# Patient Record
Sex: Male | Born: 1970
Health system: Southern US, Community
[De-identification: ages and names within clinical notes are randomized; demographics above are authoritative.]

## PROBLEM LIST (undated history)

## (undated) DIAGNOSIS — E785 Hyperlipidemia, unspecified: Secondary | ICD-10-CM

## (undated) DIAGNOSIS — I10 Essential (primary) hypertension: Secondary | ICD-10-CM

## (undated) DIAGNOSIS — M545 Low back pain, unspecified: Secondary | ICD-10-CM

## (undated) DIAGNOSIS — F319 Bipolar disorder, unspecified: Secondary | ICD-10-CM

## (undated) DIAGNOSIS — F419 Anxiety disorder, unspecified: Secondary | ICD-10-CM

## (undated) DIAGNOSIS — J449 Chronic obstructive pulmonary disease, unspecified: Secondary | ICD-10-CM

## (undated) DIAGNOSIS — F329 Major depressive disorder, single episode, unspecified: Secondary | ICD-10-CM

## (undated) DIAGNOSIS — N179 Acute kidney failure, unspecified: Secondary | ICD-10-CM

## (undated) DIAGNOSIS — F32A Depression, unspecified: Secondary | ICD-10-CM

## (undated) HISTORY — DX: Low back pain, unspecified: M54.50

## (undated) HISTORY — DX: Hyperlipidemia, unspecified: E78.5

## (undated) HISTORY — DX: Chronic obstructive pulmonary disease, unspecified: J44.9

## (undated) HISTORY — DX: Bipolar disorder, unspecified: F31.9

## (undated) HISTORY — DX: Major depressive disorder, single episode, unspecified: F32.9

## (undated) HISTORY — DX: Essential (primary) hypertension: I10

## (undated) HISTORY — DX: Anxiety disorder, unspecified: F41.9

## (undated) HISTORY — DX: Low back pain: M54.5

## (undated) HISTORY — DX: Depression, unspecified: F32.A

## (undated) HISTORY — DX: Acute kidney failure, unspecified: N17.9

## (undated) HISTORY — PX: NO PAST SURGERIES: SHX2092

---

## 2010-10-01 ENCOUNTER — Ambulatory Visit (INDEPENDENT_AMBULATORY_CARE_PROVIDER_SITE_OTHER): Payer: 59 | Admitting: Internal Medicine

## 2010-10-01 ENCOUNTER — Encounter: Payer: Self-pay | Admitting: Internal Medicine

## 2010-10-01 DIAGNOSIS — R599 Enlarged lymph nodes, unspecified: Secondary | ICD-10-CM | POA: Insufficient documentation

## 2010-10-01 DIAGNOSIS — F17200 Nicotine dependence, unspecified, uncomplicated: Secondary | ICD-10-CM

## 2010-10-01 DIAGNOSIS — J45901 Unspecified asthma with (acute) exacerbation: Secondary | ICD-10-CM | POA: Insufficient documentation

## 2010-10-01 DIAGNOSIS — M545 Low back pain: Secondary | ICD-10-CM | POA: Insufficient documentation

## 2010-10-01 DIAGNOSIS — J209 Acute bronchitis, unspecified: Secondary | ICD-10-CM

## 2010-10-10 NOTE — Assessment & Plan Note (Signed)
Summary: NEW PT /NWS   Vital Signs:  Patient profile:   40 year old male Height:      77 inches Weight:      246 pounds BMI:     29.28 O2 Sat:      96 % on Room air Temp:     98.0 degrees F oral Pulse rate:   82 / minute Pulse rhythm:   regular Resp:     16 per minute BP sitting:   106 / 68  (left arm) Cuff size:   large  Vitals Entered By: Rock Nephew CMA (October 01, 2010 2:38 PM)  Nutrition Counseling: Patient's BMI is greater than 25 and therefore counseled on weight management options.  O2 Flow:  Room air CC: Patient c/o cough x 3-4wks, URI symptoms Is Patient Diabetic? No Pain Assessment Patient in pain? yes     Location: neck  Does patient need assistance? Functional Status Self care Ambulation Normal   Primary Care Provider:  Etta Grandchild MD  CC:  Patient c/o cough x 3-4wks and URI symptoms.  History of Present Illness:  URI Symptoms      This is a 40 year old man who presents with URI symptoms.  The symptoms began 3 weeks ago.  The severity is described as mild.  The patient reports productive cough and sick contacts, but denies nasal congestion, clear nasal discharge, purulent nasal discharge, sore throat, dry cough, and earache.  Associated symptoms include dyspnea and wheezing.  The patient denies fever, stiff neck, rash, vomiting, diarrhea, use of an antipyretic, and response to antipyretic.  The patient denies itchy throat, sneezing, seasonal symptoms, headache, muscle aches, and severe fatigue.  The patient denies the following risk factors for Strep sinusitis: unilateral facial pain, unilateral nasal discharge, poor response to decongestant, double sickening, tooth pain, Strep exposure, tender adenopathy, and absence of cough.    Also, he wants to try Wellbutrin to quit smoking.  Preventive Screening-Counseling & Management  Alcohol-Tobacco     Alcohol drinks/day: 1     Alcohol type: beer     >5/day in last 3 mos: no     Alcohol Counseling:  not indicated; use of alcohol is not excessive or problematic     Feels need to cut down: no     Feels annoyed by complaints: no     Feels guilty re: drinking: no     Needs 'eye opener' in am: no     Smoking Status: current     Smoking Cessation Counseling: yes     Smoke Cessation Stage: ready     Packs/Day: 1.0     Year Started: 1992     Pack years: 20     Tobacco Counseling: to quit use of tobacco products  Caffeine-Diet-Exercise     Does Patient Exercise: yes  Hep-HIV-STD-Contraception     Hepatitis Risk: no risk noted     HIV Risk: no risk noted     STD Risk: no risk noted      Sexual History:  currently monogamous.        Drug Use:  no.        Blood Transfusions:  no.    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    Physical Exam  General:  alert, well-developed, well-nourished, well-hydrated, appropriate dress, normal appearance, healthy-appearing, cooperative to examination, and good hygiene.   Head:  normocephalic, atraumatic, no abnormalities observed, and no abnormalities palpated.   Eyes:  vision grossly intact, pupils equal, pupils round, and pupils reactive to light.   Ears:  R ear normal and L ear normal.   Nose:   External nasal examination shows no deformity or inflammation. Nasal mucosa are pink and moist without lesions or exudates. Mouth:  Oral mucosa and oropharynx without lesions or exudates.  Teeth in good repair. Neck:  supple, full ROM, no masses, no thyromegaly, no thyroid nodules or tenderness, no JVD, normal carotid upstroke, no carotid bruits, no neck tenderness, and cervical lymphadenopathy.   Lungs:  normal respiratory effort, no intercostal retractions, no accessory muscle use, normal breath sounds, no dullness, no fremitus, no crackles, R wheezes, and L wheezes.   Heart:  normal rate, regular rhythm, no murmur, no gallop, no rub, and no JVD.   Abdomen:  soft, non-tender, normal bowel sounds, no distention, no masses, no guarding, no rigidity, no rebound tenderness, no abdominal hernia, no inguinal hernia, no hepatomegaly, and no splenomegaly.   Msk:  No deformity or scoliosis noted of thoracic or lumbar spine.   Pulses:  R and L carotid,radial,femoral,dorsalis pedis and posterior tibial pulses are full and equal bilaterally Extremities:  No clubbing, cyanosis, edema, or deformity noted with normal full range of motion of all joints.   Neurologic:  No cranial nerve deficits noted. Station and gait are normal. Plantar reflexes are down-going bilaterally. DTRs are symmetrical throughout. Sensory, motor and coordinative functions appear intact. Skin:  turgor normal, color normal, no rashes, no suspicious lesions, no ecchymoses, no petechiae, no purpura, no ulcerations, and no edema.   Cervical Nodes:  L anterior LN tender and L anterior LN enlarged.   Axillary Nodes:  no R axillary adenopathy and no L axillary adenopathy.   Inguinal Nodes:  no R inguinal adenopathy and no L inguinal adenopathy.   Psych:  Cognition and judgment appear intact. Alert and cooperative with normal attention span and concentration. No apparent delusions, illusions, hallucinations   Impression &  Recommendations:  Problem # 1:  ASTHMA UNSPECIFIED WITH EXACERBATION (WJX-914.78) Assessment New  His updated medication list for this problem includes:    Symbicort 80-4.5 Mcg/act Aero (Budesonide-formoterol fumarate) .Marland Kitchen... 2 puffs two times a day  Problem # 2:  TOBACCO USE (ICD-305.1) Assessment: New Try wellbutrin at his request  Problem # 3:  BRONCHITIS-ACUTE (ICD-466.0) Assessment: New  His updated medication list for this problem includes:    Avelox 400 Mg Tabs (Moxifloxacin hcl) ..... One by mouth once daily for 7 days    Symbicort 80-4.5 Mcg/act Aero (Budesonide-formoterol fumarate) .Marland Kitchen... 2 puffs two times a day  Take antibiotics and other medications as directed. Encouraged to push clear liquids, get enough rest, and take acetaminophen as needed. To be seen in 5-7 days if no improvement, sooner if worse.  Problem # 4:  LYMPH NODE-ENLARGED (ICD-785.6) Assessment: New  His updated medication  list for this problem includes:    Avelox 400 Mg Tabs (Moxifloxacin hcl) ..... One by mouth once daily for 7 days  Complete Medication List: 1)  Avelox 400 Mg Tabs (Moxifloxacin hcl) .... One by mouth once daily for 7 days 2)  Symbicort 80-4.5 Mcg/act Aero (Budesonide-formoterol fumarate) .... 2 puffs two times a day 3)  Budeprion Xl 150 Mg Xr24h-tab (Bupropion hcl) .... One by mouth once daily  Patient Instructions: 1)  Please schedule a follow-up appointment in 2 weeks. 2)  Tobacco is very bad for your health and your loved ones! You Should stop smoking!. 3)  Stop Smoking Tips: Choose a Quit date. Cut down before the Quit date. decide what you will do as a substitute when you feel the urge to smoke(gum,toothpick,exercise). 4)  Take your antibiotic as prescribed until ALL of it is gone, but stop if you develop a rash or swelling and contact our office as soon as possible. 5)  Acute bronchitis symptoms for less than 10 days are not helped by antibiotics. take over the counter cough  medications. call if no improvment in  5-7 days, sooner if increasing cough, fever, or new symptoms( shortness of breath, chest pain). Prescriptions: BUDEPRION XL 150 MG XR24H-TAB (BUPROPION HCL) One by mouth once daily  #30 x 5   Entered and Authorized by:   Etta Grandchild MD   Signed by:   Etta Grandchild MD on 10/01/2010   Method used:   Print then Give to Patient   RxID:   [id number] SYMBICORT 80-4.5 MCG/ACT AERO (BUDESONIDE-FORMOTEROL FUMARATE) 2 puffs two times a day  #4 inhs x 0   Entered and Authorized by:   Etta Grandchild MD   Signed by:   Etta Grandchild MD on 10/01/2010   Method used:   Samples Given   RxID:   [id number] AVELOX 400 MG TABS (MOXIFLOXACIN HCL) one by mouth once daily for 7 days  #7 x 1   Entered and Authorized by:   Etta Grandchild MD   Signed by:   Etta Grandchild MD on 10/01/2010   Method used:   Print then Give to Patient   RxID:   [id number]    Orders Added: 1)  New Patient Level IV [53664]   Immunization History:  Pneumovax Immunization History:    Pneumovax:  historical (08/19/2009)   Immunization History:  Pneumovax Immunization History:    Pneumovax:  Historical (08/19/2009)  Preventive Care Screening  Last Tetanus Booster:    Date:  08/19/2009    Results:  Historical

## 2010-10-22 ENCOUNTER — Ambulatory Visit (INDEPENDENT_AMBULATORY_CARE_PROVIDER_SITE_OTHER): Payer: 59 | Admitting: Internal Medicine

## 2010-10-22 ENCOUNTER — Encounter: Payer: Self-pay | Admitting: Internal Medicine

## 2010-10-22 DIAGNOSIS — R599 Enlarged lymph nodes, unspecified: Secondary | ICD-10-CM

## 2010-10-22 DIAGNOSIS — J45901 Unspecified asthma with (acute) exacerbation: Secondary | ICD-10-CM

## 2010-10-30 NOTE — Assessment & Plan Note (Signed)
Summary: fu/lb   Vital Signs:  Patient profile:   40 year old male Height:      77 inches Weight:      248 pounds BMI:     29.51 O2 Sat:      97 % on Room air Temp:     98.5 degrees F oral Pulse rate:   72 / minute Pulse rhythm:   regular Resp:     16 per minute BP sitting:   122 / 72  (left arm) Cuff size:   large  Vitals Entered By: Rock Nephew CMA (October 22, 2010 10:17 AM)  Nutrition Counseling: Patient's BMI is greater than 25 and therefore counseled on weight management options.  O2 Flow:  Room air  Primary Care Provider:  Etta Grandchild MD   History of Present Illness:  Follow-Up Visit      This is a 40 year old man who presents for Follow-up visit.  The patient denies chest pain, palpitations, dizziness, syncope, edema, SOB, DOE, PND, and orthopnea.  Since the last visit the patient notes no new problems or concerns.  The patient reports taking meds as prescribed.  When questioned about possible medication side effects, the patient notes none.    Preventive Screening-Counseling & Management  Alcohol-Tobacco     Alcohol drinks/day: 1     Alcohol type: beer     >5/day in last 3 mos: no     Alcohol Counseling: not indicated; use of alcohol is not excessive or problematic     Feels need to cut down: no     Feels annoyed by complaints: no     Feels guilty re: drinking: no     Needs 'eye opener' in am: no     Smoking Status: current     Smoking Cessation Counseling: yes     Smoke Cessation Stage: contemplative     Packs/Day: 1.0     Year Started: 1992     Pack years: 20     Tobacco Counseling: to quit use of tobacco products  Hep-HIV-STD-Contraception     Hepatitis Risk: no risk noted     HIV Risk: no risk noted     STD Risk: no risk noted      Sexual History:  currently monogamous.        Drug Use:  no.        Blood Transfusions:  no.    Clinical Review Panels:  Immunizations   Last Tetanus Booster:  Historical (08/19/2009)   Last Pneumovax:   Historical (08/19/2009)  Diabetes Management   Last Pneumovax:  Historical (08/19/2009)   Medications Prior to Update: 1)  Avelox 400 Mg Tabs (Moxifloxacin Hcl) .... One By Mouth Once Daily For 7 Days 2)  Symbicort 80-4.5 Mcg/act Aero (Budesonide-Formoterol Fumarate) .... 2 Puffs Two Times A Day 3)  Budeprion Xl 150 Mg Xr24h-Tab (Bupropion Hcl) .... One By Mouth Once Daily  Current Medications (verified): 1)  Symbicort 80-4.5 Mcg/act Aero (Budesonide-Formoterol Fumarate) .... 2 Puffs Two Times A Day 2)  Budeprion Xl 150 Mg Xr24h-Tab (Bupropion Hcl) .... 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 Family History Hypertension  Social History: Reviewed history from 10/01/2010 and no changes required. 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   Resp:  Denies chest discomfort, chest pain with inspiration, cough, coughing  up blood, excessive snoring, hypersomnolence, morning headaches, pleuritic, shortness of breath, sputum productive, and wheezing.  Physical Exam  General:  alert, well-developed, well-nourished, well-hydrated, appropriate dress, normal appearance, and healthy-appearing.   Head:  normocephalic and atraumatic.   Eyes:  No corneal or conjunctival inflammation noted. EOMI. Perrla. Funduscopic exam benign, without hemorrhages, exudates or papilledema. Vision grossly normal. Ears:  External ear exam shows no significant lesions or deformities.  Otoscopic examination reveals clear canals, tympanic membranes are intact bilaterally without bulging, retraction, inflammation or discharge. Hearing is grossly normal bilaterally. Nose:  External nasal examination shows no deformity or inflammation. Nasal mucosa are pink and moist without lesions or exudates. Mouth:  Oral mucosa and oropharynx without lesions or exudates.  Teeth in good repair. Neck:  No deformities, masses, or tenderness noted. Lungs:  Normal respiratory effort, chest expands symmetrically. Lungs are clear to auscultation, no crackles or wheezes. Heart:  Normal rate and regular rhythm. S1 and S2 normal without gallop, murmur, click, rub or other extra sounds. Abdomen:  soft, non-tender, normal bowel sounds, no distention, no masses, no guarding, no rigidity, no rebound tenderness, no abdominal hernia, no inguinal hernia, no hepatomegaly, and no splenomegaly.   Msk:  No deformity or scoliosis noted of thoracic or lumbar spine.   Pulses:  R and L carotid,radial,femoral,dorsalis pedis and posterior tibial pulses are full and equal bilaterally Extremities:  No clubbing, cyanosis, edema, or deformity noted with normal full range of motion of all joints.   Neurologic:  No cranial nerve deficits noted. Station and gait are normal. Plantar reflexes are down-going bilaterally. DTRs are symmetrical throughout. Sensory, motor and coordinative functions  appear intact. Skin:  turgor normal, color normal, no rashes, no suspicious lesions, no ecchymoses, no petechiae, no purpura, no ulcerations, and no edema.   Cervical Nodes:  no anterior cervical adenopathy and no posterior cervical adenopathy.   Axillary Nodes:  no R axillary adenopathy and no L axillary adenopathy.   Inguinal Nodes:  no R inguinal adenopathy and no L inguinal adenopathy.   Psych:  Cognition and judgment appear intact. Alert and cooperative with normal attention span and concentration. No apparent delusions, illusions, hallucinations   Impression & Recommendations:  Problem # 1:  ASTHMA UNSPECIFIED WITH EXACERBATION (ICD-493.92) Assessment Improved  His updated medication list for this problem includes:    Symbicort 80-4.5 Mcg/act Aero (Budesonide-formoterol fumarate) .Marland Kitchen... 2 puffs two times a day  Pulmonary Functions Reviewed: O2 sat: 97 (10/22/2010)  Problem # 2:  LYMPH NODE-ENLARGED (ICD-785.6) Assessment: Improved  The following medications were removed from the medication list:    Avelox 400 Mg Tabs (Moxifloxacin hcl) ..... One by mouth once daily for 7 days  Complete Medication List: 1)  Symbicort 80-4.5 Mcg/act Aero (Budesonide-formoterol fumarate) .... 2 puffs two times a day 2)  Budeprion Xl  150 Mg Xr24h-tab (Bupropion hcl) .... One by mouth once daily  Patient Instructions: 1)  Please schedule a follow-up appointment in 2 months. 2)  Tobacco is very bad for your health and your loved ones! You Should stop smoking!. 3)  Stop Smoking Tips: Choose a Quit date. Cut down before the Quit date. decide what you will do as a substitute when you feel the urge to smoke(gum,toothpick,exercise). 4)  It is important that you exercise regularly at least 20 minutes 5 times a week. If you develop chest pain, have severe difficulty breathing, or feel very tired , stop exercising immediately and seek medical attention.   Orders Added: 1)  Est. Patient Level III [81191]

## 2011-01-21 ENCOUNTER — Ambulatory Visit (INDEPENDENT_AMBULATORY_CARE_PROVIDER_SITE_OTHER): Payer: 59 | Admitting: Internal Medicine

## 2011-01-21 ENCOUNTER — Encounter: Payer: 59 | Admitting: Internal Medicine

## 2011-01-21 ENCOUNTER — Encounter: Payer: Self-pay | Admitting: Internal Medicine

## 2011-01-21 ENCOUNTER — Other Ambulatory Visit (INDEPENDENT_AMBULATORY_CARE_PROVIDER_SITE_OTHER): Payer: 59

## 2011-01-21 VITALS — BP 122/70 | HR 74 | Temp 97.9°F | Resp 16 | Wt 237.0 lb

## 2011-01-21 DIAGNOSIS — Z0000 Encounter for general adult medical examination without abnormal findings: Secondary | ICD-10-CM

## 2011-01-21 DIAGNOSIS — J45901 Unspecified asthma with (acute) exacerbation: Secondary | ICD-10-CM

## 2011-01-21 DIAGNOSIS — F17200 Nicotine dependence, unspecified, uncomplicated: Secondary | ICD-10-CM

## 2011-01-21 DIAGNOSIS — Z23 Encounter for immunization: Secondary | ICD-10-CM

## 2011-01-21 LAB — CBC WITH DIFFERENTIAL/PLATELET
Basophils Relative: 0.3 % (ref 0.0–3.0)
Eosinophils Absolute: 0.2 10*3/uL (ref 0.0–0.7)
Lymphocytes Relative: 32.5 % (ref 12.0–46.0)
MCHC: 35.2 g/dL (ref 30.0–36.0)
MCV: 92.3 fl (ref 78.0–100.0)
Monocytes Absolute: 0.5 10*3/uL (ref 0.1–1.0)
Neutrophils Relative %: 57.6 % (ref 43.0–77.0)
Platelets: 177 10*3/uL (ref 150.0–400.0)
RBC: 4.84 Mil/uL (ref 4.22–5.81)
WBC: 7.3 10*3/uL (ref 4.5–10.5)

## 2011-01-21 LAB — LIPID PANEL
Cholesterol: 143 mg/dL (ref 0–200)
HDL: 31.2 mg/dL — ABNORMAL LOW (ref >39.00)
LDL Cholesterol: 77 mg/dL (ref 0–99)
VLDL: 34.8 mg/dL (ref 0.0–40.0)

## 2011-01-21 LAB — COMPREHENSIVE METABOLIC PANEL
ALT: 42 U/L (ref 0–53)
AST: 25 U/L (ref 0–37)
Albumin: 4.5 g/dL (ref 3.5–5.2)
Alkaline Phosphatase: 77 U/L (ref 39–117)
BUN: 13 mg/dL (ref 6–23)
Calcium: 9.2 mg/dL (ref 8.4–10.5)
Chloride: 101 mEq/L (ref 96–112)
Potassium: 4.1 mEq/L (ref 3.5–5.1)
Sodium: 138 mEq/L (ref 135–145)
Total Protein: 7.1 g/dL (ref 6.0–8.3)

## 2011-01-21 MED ORDER — PNEUMOCOCCAL VAC POLYVALENT 25 MCG/0.5ML IJ INJ: 0.5000 mL | INJECTION | Freq: Once | INTRAMUSCULAR | Status: DC

## 2011-01-21 NOTE — Progress Notes (Signed)
  Subjective:    Patient ID: Matthew [name], male    DOB: [birth year], 40 y.o.   MRN: [id number]  HPI He returns for a complete physical and he tells me that he is doing well.  Review of Systems  Constitutional: Negative for fever, chills, diaphoresis, activity change, appetite change, fatigue and unexpected weight change.  HENT: Negative for facial swelling, neck pain and neck stiffness.   Eyes: Negative for photophobia and visual disturbance.  Respiratory: Negative for apnea, cough, choking, chest tightness, shortness of breath, wheezing and stridor.   Cardiovascular: Negative for chest pain, palpitations and leg swelling.  Gastrointestinal: Negative for nausea, vomiting, abdominal pain, diarrhea, constipation and blood in stool.  Genitourinary: Negative for dysuria, urgency, frequency, hematuria, flank pain, decreased urine volume, discharge, penile swelling, scrotal swelling, enuresis, difficulty urinating, genital sores, penile pain and testicular pain.  Musculoskeletal: Negative for myalgias, back pain, joint swelling, arthralgias and gait problem.  Skin: Negative for color change, pallor and rash.  Neurological: Negative for dizziness, tremors, seizures, syncope, facial asymmetry, speech difficulty, weakness, light-headedness, numbness and headaches.  Hematological: Negative for adenopathy. Does not bruise/bleed easily.  Psychiatric/Behavioral: Negative.  Negative for suicidal ideas, hallucinations, behavioral problems, confusion, sleep disturbance, self-injury, dysphoric mood, decreased concentration and agitation. The patient is not nervous/anxious and is not hyperactive.        Objective:   Physical Exam  Vitals reviewed. Constitutional: He is oriented to person, place, and time. He appears well-developed and well-nourished. No distress.  HENT:  Head: Normocephalic and atraumatic.  Right Ear: External ear normal.  Left Ear: External ear normal.  Nose: Nose normal.    Mouth/Throat: Oropharynx is clear and moist. No oropharyngeal exudate.  Eyes: Conjunctivae and EOM are normal. Pupils are equal, round, and reactive to light. Right eye exhibits no discharge. Left eye exhibits no discharge. No scleral icterus.  Neck: Normal range of motion. Neck supple. No JVD present. No tracheal deviation present. No thyromegaly present.  Cardiovascular: Normal rate, regular rhythm, normal heart sounds and intact distal pulses.  Exam reveals no gallop and no friction rub.   No murmur heard. Pulmonary/Chest: Effort normal and breath sounds normal. No stridor. No respiratory distress. He has no wheezes. He has no rales. He exhibits no tenderness.  Abdominal: Soft. Bowel sounds are normal. He exhibits no distension and no mass. There is no tenderness. There is no rebound and no guarding. Hernia confirmed negative in the right inguinal area and confirmed negative in the left inguinal area.  Genitourinary: Testes normal and penis normal. Right testis shows no mass, no swelling and no tenderness. Left testis shows no mass, no swelling and no tenderness. Circumcised. No penile erythema or penile tenderness. No discharge found.  Musculoskeletal: Normal range of motion. He exhibits no edema and no tenderness.  Lymphadenopathy:    He has no cervical adenopathy.       Right: No inguinal adenopathy present.       Left: No inguinal adenopathy present.  Neurological: He is alert and oriented to person, place, and time. He has normal reflexes. He displays normal reflexes. No cranial nerve deficit. He exhibits normal muscle tone. Coordination normal.  Skin: Skin is warm and dry. No rash noted. He is not diaphoretic. No erythema. No pallor.  Psychiatric: He has a normal mood and affect. His behavior is normal. Judgment and thought content normal.          Assessment & Plan:

## 2011-01-21 NOTE — Assessment & Plan Note (Signed)
He did not take the wellbutrin consistently and did not stop smoking yet, I encouraged him to quit

## 2011-01-21 NOTE — Patient Instructions (Signed)
Health Maintenance in Males MAINTAIN REGULAR HEALTH EXAMS  Maintain a healthy diet and normal weight. Increased weight leads to problems with blood pressure and diabetes. Decrease fat in the diet and increase exercise. Obtain a proper diet from your caregiver if necessary.   High blood pressure causes heart and blood vessel problems. Check blood pressures regularly and keep your blood pressure at normal limits. Aerobic exercise helps this. Persistent elevations of blood pressure should be treated with medications if weight loss and exercise are ineffective.   Avoid smoking, drinking in excess (more than 2 drinks per day), or use of street drugs. Do not share needles with anyone. Ask for help if you need assistance or instructions on stopping the use of alcohol, cigarettes, or drugs.   Maintain normal blood lipids and cholesterol. Your caregiver can give you information to lower your risk of heart disease or stroke.   Ask your caregiver if you are in need of early heart disease screening because of a strong family history of heart disease or signs of elevated testosterone (male sex hormone) levels. These can predispose you to early heart disease.   Practice safe sex. Practicing safe sex decreases your risk for a sexually transmitted infection (STI). Some of the STIs are gonorrhea, chlamydia, syphilis, trichimonas, herpes, human papillomavirus (HPV), and human immunodeficiency virus (HIV). Herpes, HIV, and HPV are viral illnesses that have no cure. These can result in disability, cancer, and death.   It is not safe for someone who has AIDS or is HIV positive to have unprotected sex with a partner who is HIV positive. The reason for this is the fact that there are many different strains of HIV. If you have a strain that is readily treated with medications and then suddenly introduce a strain from a partner that has no further treatment options, you may suddenly have a strain of HIV that is untreatable.  Even if you are both positive for HIV, it is still necessary to practice safe sex.   Use sunscreen with a SPF of 15 or greater. Being outside in the sun when your shadow caused by the sun is shorter than you are, means you are being exposed to sun at greater intensity. Lighter skinned people are at a greater risk of skin cancer.   Keep carbon monoxide and smoke detectors in your home and functioning at all times. Change the batteries every 6 months.   Do monthly examinations of your testicles. The best time to do this is after a hot shower or bath when the tissues are loose. Notify your caregivers of any lumps, tenderness, or changes in size or shape.   Notify your caregiver of new moles or changes in moles, especially if there is a change in shape or color. Also notify your caregiver if a mole is larger than the size of a pencil eraser.   Stay current with your tetanus shots and other required immunizations.  The Body Mass Index (BMI) is a way of measuring how much of your body is fat. Having a BMI above 27 increases the risk of heart disease, diabetes, hypertension, stroke, and other problems related to obesity. Document Released: 02/01/2008 Document Re-Released: 01/23/2010 [facility] Patient Information 2011 [place], [place].Smoking Cessation This document explains the best ways for you to quit smoking and new treatments to help. It lists new medicines that can double or triple your chances of quitting and quitting for good. It also considers ways to avoid relapses and concerns you may have about quitting,  including weight gain. NICOTINE: A POWERFUL ADDICTION If you have tried to quit smoking, you know how hard it can be. It is hard because nicotine is a very addictive drug. For some people, it can be as addictive as heroin or cocaine. Usually, people make 2 or 3 tries, or more, before finally being able to quit. Each time you try to quit, you can learn about what helps and what hurts. Quitting takes  hard work and a lot of effort, but you can quit smoking. QUITTING SMOKING IS ONE OF THE MOST IMPORTANT THINGS YOU WILL EVER DO:  You will live longer, feel better, and live better.   The impact on your body of quitting smoking is felt almost immediately:   Within 20 minutes, blood pressure decreases. Pulse returns to its normal level.   After 8 hours, carbon monoxide levels in the blood return to normal. Oxygen level increases.   After 24 hours, chance of heart attack starts to decrease. Breath, hair, and body stop smelling like smoke.   After 48 hours, damaged nerve endings begin to recover. Sense of taste and smell improve.   After 72 hours, the body is virtually free of nicotine. Bronchial tubes relax and breathing becomes easier.   After 2 to 12 weeks, lungs can hold more air. Exercise becomes easier and circulation improves.   Quitting will lower your chance of having a heart attack, stroke, cancer, or lung disease:   After 1 year, the risk of coronary heart disease is cut in half.   After 5 years, the risk of stroke falls to the same as a nonsmoker.   After 10 years, the risk of lung cancer is cut in half and the risk of other cancers decreases significantly.   After 15 years, the risk of coronary heart disease drops, usually to the level of a nonsmoker.   If you are pregnant, quitting smoking will improve your chances of having a healthy baby.   The people you live with, especially your children, will be healthier.   You will have extra money to spend on things other than cigarettes.  FIVE KEYS TO QUITTING Studies have shown that these 5 steps will help you quit smoking and quit for good. You have the best chances of quitting if you use them together: 1. Get ready.  2. Get support and encouragement.  3. Learn new skills and behaviors.  4. Get medicine to reduce your nicotine addiction and use it correctly.  5. Be prepared for relapse or difficult situations. Be  determined to continue trying to quit, even if you do not succeed at first.  1. GET READY  Set a quit date.   Change your environment.   Get rid of ALL cigarettes, ashtrays, matches, and lighters in your home, car, and place of work.   Do not let people smoke in your home.   Review your past attempts to quit. Think about what worked and what did not.   Once you quit, do not smoke. NOT EVEN A PUFF!  2. GET SUPPORT AND ENCOURAGEMENT Studies have shown that you have a better chance of being successful if you have help. You can get support in many ways.  Tell your family, friends, and coworkers that you are going to quit and need their support. Ask them not to smoke around you.   Talk to your caregivers (doctor, dentist, nurse, pharmacist, psychologist, and/or smoking counselor).   Get individual, group, or telephone counseling and support. The more  counseling you have, the better your chances are of quitting. Programs are available at [employer] and health centers. Call your local health department for information about programs in your area.   Spiritual beliefs and practices may help some smokers quit.   Quit meters are Photographer that keep track of quit statistics, such as amount of "quit-time," cigarettes not smoked, and money saved.   Many smokers find one or more of the many self-help books available useful in helping them quit and stay off tobacco.  3. LEARN NEW SKILLS AND BEHAVIORS  Try to distract yourself from urges to smoke. Talk to someone, go for a walk, or occupy your time with a task.   When you first try to quit, change your routine. Take a different route to work. Drink tea instead of coffee. Eat breakfast in a different place.   Do something to reduce your stress. Take a hot bath, exercise, or read a book.   Plan something enjoyable to do every day. Reward yourself for not smoking.   Explore interactive web-based programs  that specialize in helping you quit.  4. GET MEDICINE AND USE IT CORRECTLY Medicines can help you stop smoking and decrease the urge to smoke. Combining medicine with the above behavioral methods and support can quadruple your chances of successfully quitting smoking. The [place] Food and Drug Administration (FDA) has approved 7 medicines to help you quit smoking. These medicines fall into 3 categories.  Nicotine replacement therapy (delivers nicotine to your body without the negative effects and risks of smoking):   Nicotine gum: Available over-the-counter.   Nicotine lozenges: Available over-the-counter.   Nicotine inhaler: Available by prescription.   Nicotine nasal spray: Available by prescription.   Nicotine skin patches (transdermal): Available by prescription and over-the-counter.   Antidepressant medicine (helps people abstain from smoking, but how this works is unknown):   Bupropion sustained-release (SR) tablets: Available by prescription.   Nicotinic receptor partial agonist (simulates the effect of nicotine in your brain):   Varenicline tartrate tablets: Available by prescription.   Ask your caregiver for advice about which medicines to use and how to use them. Carefully read the information on the package.   Everyone who is trying to quit may benefit from using a medicine. If you are pregnant or trying to become pregnant, nursing an infant, you are under age 27, or you smoke fewer than 10 cigarettes per day, talk to your caregiver before taking any nicotine replacement medicines.   You should stop using a nicotine replacement product and call your caregiver if you experience nausea, dizziness, weakness, vomiting, fast or irregular heartbeat, mouth problems with the lozenge or gum, or redness or swelling of the skin around the patch that does not go away.   Do not use any other product containing nicotine while using a nicotine replacement product.   Talk to your caregiver  before using these products if you have diabetes, heart disease, asthma, stomach ulcers, you had a recent heart attack, you have high blood pressure that is not controlled with medicine, a history of irregular heartbeat, or you have been prescribed medicine to help you quit smoking.  5. BE PREPARED FOR RELAPSE OR DIFFICULT SITUATIONS  Most relapses occur within the first 3 months after quitting. Do not be discouraged if you start smoking again. Remember, most people try several times before they finally quit.   You may have symptoms of withdrawal because your body is used to nicotine. You  may crave cigarettes, be irritable, feel very hungry, cough often, get headaches, or have difficulty concentrating.   The withdrawal symptoms are only temporary. They are strongest when you first quit, but they will go away within 10 to 14 days.  Here are some difficult situations to watch for:  Alcohol. Avoid drinking alcohol. Drinking lowers your chances of successfully quitting.   Caffeine. Try to reduce the amount of caffeine you consume. It also lowers your chances of successfully quitting.   Other smokers. Being around smoking can make you want to smoke. Avoid smokers.   Weight gain. Many smokers will gain weight when they quit, usually less than 10 pounds. Eat a healthy diet and stay active. Do not let weight gain distract you from your main goal, quitting smoking. Some medicines that help you quit smoking may also help delay weight gain. You can always lose the weight gained after you quit.   Bad mood or depression. There are a lot of ways to improve your mood other than smoking.  If you are having problems with any of these situations, talk to your caregiver. SPECIAL SITUATIONS OR CONDITIONS Studies suggest that everyone can quit smoking. Your situation or condition can give you a special reason to quit.  Pregnant women/New mothers: By quitting, you protect your baby's health and your own.    Hospitalized patients: By quitting, you reduce health problems and help healing.   Heart attack patients: By quitting, you reduce your risk of a second heart attack.   Lung, head, and neck cancer patients: By quitting, you reduce your chance of a second cancer.   Parents of children and adolescents: By quitting, you protect your children from illnesses caused by secondhand smoke.  QUESTIONS TO THINK ABOUT Think about the following questions before you try to stop smoking. You may want to talk about your answers with your caregiver.  Why do you want to quit?   If you tried to quit in the past, what helped and what did not?   What will be the most difficult situations for you after you quit? How will you plan to handle them?   Who can help you through the tough times? Your family? Friends? Caregiver?   What pleasures do you get from smoking? What ways can you still get pleasure if you quit?  Here are some questions to ask your caregiver:  How can you help me to be successful at quitting?   What medicine do you think would be best for me and how should I take it?   What should I do if I need more help?   What is smoking withdrawal like? How can I get information on withdrawal?  Quitting takes hard work and a lot of effort, but you can quit smoking. FOR MORE INFORMATION Smokefree.gov (http://www.davis-sullivan.com/) provides free, accurate, evidence-based information and professional assistance to help support the immediate and long-term needs of people trying to quit smoking. Document Released: 07/30/2001 Document Re-Released: 01/23/2010 [facility] Patient Information 2011 [place], [place].

## 2011-01-21 NOTE — Assessment & Plan Note (Signed)
Vaccines updated and given, labs ordered, pt ed material given

## 2012-01-18 DIAGNOSIS — N179 Acute kidney failure, unspecified: Secondary | ICD-10-CM

## 2012-01-18 HISTORY — DX: Acute kidney failure, unspecified: N17.9

## 2012-01-25 ENCOUNTER — Emergency Department (HOSPITAL_COMMUNITY)
Admission: EM | Admit: 2012-01-25 | Discharge: 2012-01-26 | Disposition: A | Discharge status: Home / Self Care | Payer: 59 | Attending: Emergency Medicine | Admitting: Emergency Medicine

## 2012-01-25 DIAGNOSIS — R1013 Epigastric pain: Secondary | ICD-10-CM | POA: Insufficient documentation

## 2012-01-25 DIAGNOSIS — N289 Disorder of kidney and ureter, unspecified: Secondary | ICD-10-CM | POA: Insufficient documentation

## 2012-01-25 DIAGNOSIS — R110 Nausea: Secondary | ICD-10-CM | POA: Insufficient documentation

## 2012-01-25 DIAGNOSIS — R109 Unspecified abdominal pain: Secondary | ICD-10-CM

## 2012-01-25 LAB — CBC
HCT: 40.2 % (ref 39.0–52.0)
Hemoglobin: 14.3 g/dL (ref 13.0–17.0)
MCH: 31.2 pg (ref 26.0–34.0)
RBC: 4.58 MIL/uL (ref 4.22–5.81)

## 2012-01-25 MED ORDER — ONDANSETRON HCL 4 MG/2ML IJ SOLN
4.0000 mg | Freq: Once | INTRAMUSCULAR | Status: AC
  Administered 2012-01-26: 4 mg via INTRAVENOUS
  Filled 2012-01-25: qty 2

## 2012-01-25 MED ORDER — SODIUM CHLORIDE 0.9 % IV SOLN
Freq: Once | INTRAVENOUS | Status: AC
  Administered 2012-01-26: 01:00:00 via INTRAVENOUS

## 2012-01-25 MED ORDER — HYDROMORPHONE HCL PF 1 MG/ML IJ SOLN
1.0000 mg | Freq: Once | INTRAMUSCULAR | Status: AC
  Administered 2012-01-26: 1 mg via INTRAVENOUS
  Filled 2012-01-25: qty 1

## 2012-01-25 MED ORDER — SODIUM CHLORIDE 0.9 % IV BOLUS (SEPSIS)
1000.0000 mL | Freq: Once | INTRAVENOUS | Status: AC
  Administered 2012-01-26: 1000 mL via INTRAVENOUS

## 2012-01-25 NOTE — ED Notes (Signed)
MD at bedside. 

## 2012-01-25 NOTE — ED Notes (Signed)
Per pt: Pt with recent UTI and prostatitis, on Cipro.  Pt left work Wednesday due to chronic back pain and stomach pain, and has been feeling nauseated since, felt a little better Friday. Feels worse today, but not as bad as Wednesday.

## 2012-01-25 NOTE — ED Notes (Signed)
Pt alert, nad, c/o gen abd pain, onset several days ago, associated with diarrhea, low back pain, and nausea, pt states recently treated for UTI/prostatitis, states could not finish antibiotics d/t nausea.

## 2012-01-26 ENCOUNTER — Inpatient Hospital Stay (HOSPITAL_COMMUNITY)
Admission: EM | Admit: 2012-01-26 | Discharge: 2012-01-30 | DRG: 392 | Disposition: A | Discharge status: Home / Self Care | Payer: 59 | Attending: Family Medicine | Admitting: Family Medicine

## 2012-01-26 ENCOUNTER — Encounter (HOSPITAL_COMMUNITY): Payer: Self-pay | Admitting: *Deleted

## 2012-01-26 ENCOUNTER — Emergency Department (HOSPITAL_COMMUNITY): Payer: 59

## 2012-01-26 DIAGNOSIS — R51 Headache: Secondary | ICD-10-CM | POA: Diagnosis present

## 2012-01-26 DIAGNOSIS — F17200 Nicotine dependence, unspecified, uncomplicated: Secondary | ICD-10-CM | POA: Diagnosis present

## 2012-01-26 DIAGNOSIS — Z0000 Encounter for general adult medical examination without abnormal findings: Secondary | ICD-10-CM

## 2012-01-26 DIAGNOSIS — Z791 Long term (current) use of non-steroidal anti-inflammatories (NSAID): Secondary | ICD-10-CM

## 2012-01-26 DIAGNOSIS — A088 Other specified intestinal infections: Secondary | ICD-10-CM

## 2012-01-26 DIAGNOSIS — J45909 Unspecified asthma, uncomplicated: Secondary | ICD-10-CM | POA: Diagnosis present

## 2012-01-26 DIAGNOSIS — M545 Low back pain, unspecified: Secondary | ICD-10-CM | POA: Diagnosis present

## 2012-01-26 DIAGNOSIS — Z79899 Other long term (current) drug therapy: Secondary | ICD-10-CM

## 2012-01-26 DIAGNOSIS — R109 Unspecified abdominal pain: Secondary | ICD-10-CM | POA: Diagnosis present

## 2012-01-26 DIAGNOSIS — J45901 Unspecified asthma with (acute) exacerbation: Secondary | ICD-10-CM

## 2012-01-26 DIAGNOSIS — K5289 Other specified noninfective gastroenteritis and colitis: Principal | ICD-10-CM | POA: Diagnosis present

## 2012-01-26 DIAGNOSIS — G8929 Other chronic pain: Secondary | ICD-10-CM | POA: Diagnosis present

## 2012-01-26 DIAGNOSIS — Z720 Tobacco use: Secondary | ICD-10-CM | POA: Diagnosis present

## 2012-01-26 DIAGNOSIS — N179 Acute kidney failure, unspecified: Secondary | ICD-10-CM | POA: Diagnosis present

## 2012-01-26 DIAGNOSIS — E860 Dehydration: Secondary | ICD-10-CM | POA: Diagnosis present

## 2012-01-26 DIAGNOSIS — N289 Disorder of kidney and ureter, unspecified: Secondary | ICD-10-CM

## 2012-01-26 DIAGNOSIS — N10 Acute tubulo-interstitial nephritis: Secondary | ICD-10-CM | POA: Diagnosis present

## 2012-01-26 DIAGNOSIS — K529 Noninfective gastroenteritis and colitis, unspecified: Secondary | ICD-10-CM

## 2012-01-26 LAB — COMPREHENSIVE METABOLIC PANEL
AST: 14 U/L (ref 0–37)
BUN: 19 mg/dL (ref 6–23)
CO2: 25 mEq/L (ref 19–32)
Chloride: 104 mEq/L (ref 96–112)
Creatinine, Ser: 2.57 mg/dL — ABNORMAL HIGH (ref 0.50–1.35)
GFR calc non Af Amer: 29 mL/min — ABNORMAL LOW (ref >90)
Total Bilirubin: 0.6 mg/dL (ref 0.3–1.2)

## 2012-01-26 LAB — URINALYSIS, ROUTINE W REFLEX MICROSCOPIC
Bilirubin Urine: NEGATIVE
Glucose, UA: NEGATIVE mg/dL
Nitrite: NEGATIVE
Specific Gravity, Urine: 1.012 (ref 1.005–1.030)
pH: 5.5 (ref 5.0–8.0)

## 2012-01-26 LAB — BASIC METABOLIC PANEL
CO2: 26 mEq/L (ref 19–32)
Calcium: 9.2 mg/dL (ref 8.4–10.5)
Glucose, Bld: 105 mg/dL — ABNORMAL HIGH (ref 70–99)
Potassium: 4.3 mEq/L (ref 3.5–5.1)
Sodium: 135 mEq/L (ref 135–145)

## 2012-01-26 LAB — HEPATIC FUNCTION PANEL
ALT: 26 U/L (ref 0–53)
Albumin: 4 g/dL (ref 3.5–5.2)
Alkaline Phosphatase: 92 U/L (ref 39–117)
Total Protein: 7.4 g/dL (ref 6.0–8.3)

## 2012-01-26 LAB — CBC
HCT: 40.2 % (ref 39.0–52.0)
Hemoglobin: 14.6 g/dL (ref 13.0–17.0)
RBC: 4.6 MIL/uL (ref 4.22–5.81)
WBC: 9.4 10*3/uL (ref 4.0–10.5)

## 2012-01-26 LAB — DIFFERENTIAL
Basophils Absolute: 0 10*3/uL (ref 0.0–0.1)
Lymphocytes Relative: 11 % — ABNORMAL LOW (ref 12–46)
Monocytes Absolute: 0.5 10*3/uL (ref 0.1–1.0)
Monocytes Relative: 5 % (ref 3–12)
Neutro Abs: 7.9 10*3/uL — ABNORMAL HIGH (ref 1.7–7.7)

## 2012-01-26 LAB — URINE MICROSCOPIC-ADD ON

## 2012-01-26 LAB — MAGNESIUM: Magnesium: 2.2 mg/dL (ref 1.5–2.5)

## 2012-01-26 MED ORDER — HEPARIN SODIUM (PORCINE) 5000 UNIT/ML IJ SOLN
5000.0000 [IU] | Freq: Three times a day (TID) | INTRAMUSCULAR | Status: DC
  Administered 2012-01-27 – 2012-01-30 (×11): 5000 [IU] via SUBCUTANEOUS
  Filled 2012-01-26 (×15): qty 1

## 2012-01-26 MED ORDER — SODIUM CHLORIDE 0.9 % IV SOLN: INTRAVENOUS | Status: DC

## 2012-01-26 MED ORDER — SODIUM CHLORIDE 0.9 % IV BOLUS (SEPSIS)
1000.0000 mL | Freq: Once | INTRAVENOUS | Status: AC
  Administered 2012-01-26: 1000 mL via INTRAVENOUS

## 2012-01-26 MED ORDER — HYDROMORPHONE HCL PF 1 MG/ML IJ SOLN
0.5000 mg | INTRAMUSCULAR | Status: DC | PRN
  Administered 2012-01-26 – 2012-01-27 (×3): 0.5 mg via INTRAVENOUS
  Filled 2012-01-26 (×3): qty 1

## 2012-01-26 MED ORDER — SODIUM CHLORIDE 0.9 % IV SOLN
INTRAVENOUS | Status: DC
  Administered 2012-01-26 – 2012-01-29 (×10): via INTRAVENOUS

## 2012-01-26 MED ORDER — FENTANYL CITRATE 0.05 MG/ML IJ SOLN
100.0000 ug | Freq: Once | INTRAMUSCULAR | Status: AC
  Administered 2012-01-26: 100 ug via INTRAVENOUS
  Filled 2012-01-26: qty 2

## 2012-01-26 MED ORDER — SODIUM CHLORIDE 0.9 % IJ SOLN
3.0000 mL | Freq: Two times a day (BID) | INTRAMUSCULAR | Status: DC
  Administered 2012-01-28 – 2012-01-29 (×2): 3 mL via INTRAVENOUS

## 2012-01-26 MED ORDER — HYDROMORPHONE HCL PF 1 MG/ML IJ SOLN
1.0000 mg | Freq: Once | INTRAMUSCULAR | Status: AC
  Administered 2012-01-26: 1 mg via INTRAVENOUS
  Filled 2012-01-26: qty 1

## 2012-01-26 MED ORDER — HYDROMORPHONE HCL PF 1 MG/ML IJ SOLN: 1.0000 mg | INTRAMUSCULAR | Status: DC | PRN

## 2012-01-26 MED ORDER — PANTOPRAZOLE SODIUM 40 MG IV SOLR
40.0000 mg | INTRAVENOUS | Status: DC
  Administered 2012-01-27 – 2012-01-28 (×3): 40 mg via INTRAVENOUS
  Filled 2012-01-26 (×4): qty 40

## 2012-01-26 MED ORDER — ONDANSETRON HCL 4 MG/2ML IJ SOLN
4.0000 mg | Freq: Once | INTRAMUSCULAR | Status: AC
  Administered 2012-01-26: 4 mg via INTRAVENOUS
  Filled 2012-01-26: qty 2

## 2012-01-26 MED ORDER — ONDANSETRON 8 MG PO TBDP: 8.0000 mg | ORAL_TABLET | Freq: Three times a day (TID) | ORAL | Status: AC | PRN

## 2012-01-26 MED ORDER — DIPHENHYDRAMINE HCL 50 MG/ML IJ SOLN
12.5000 mg | Freq: Once | INTRAMUSCULAR | Status: AC
  Administered 2012-01-26: 12.5 mg via INTRAVENOUS
  Filled 2012-01-26: qty 1

## 2012-01-26 MED ORDER — ONDANSETRON HCL 4 MG/2ML IJ SOLN
4.0000 mg | Freq: Four times a day (QID) | INTRAMUSCULAR | Status: DC | PRN
  Administered 2012-01-26 – 2012-01-29 (×5): 4 mg via INTRAVENOUS
  Filled 2012-01-26 (×5): qty 2

## 2012-01-26 MED ORDER — ACETAMINOPHEN 325 MG PO TABS
650.0000 mg | ORAL_TABLET | Freq: Four times a day (QID) | ORAL | Status: DC | PRN
  Administered 2012-01-28: 650 mg via ORAL
  Filled 2012-01-26: qty 2

## 2012-01-26 MED ORDER — OXYCODONE-ACETAMINOPHEN 5-325 MG PO TABS: 1.0000 | ORAL_TABLET | ORAL | Status: DC | PRN

## 2012-01-26 MED ORDER — NICOTINE 21 MG/24HR TD PT24
21.0000 mg | MEDICATED_PATCH | Freq: Every day | TRANSDERMAL | Status: DC
  Administered 2012-01-27 – 2012-01-29 (×4): 21 mg via TRANSDERMAL
  Filled 2012-01-26 (×5): qty 1

## 2012-01-26 MED ORDER — CEPHALEXIN 500 MG PO CAPS: 500.0000 mg | ORAL_CAPSULE | Freq: Four times a day (QID) | ORAL | Status: DC

## 2012-01-26 MED ORDER — PROMETHAZINE HCL 25 MG/ML IJ SOLN
12.5000 mg | Freq: Once | INTRAMUSCULAR | Status: AC
  Administered 2012-01-26: 12.5 mg via INTRAVENOUS
  Filled 2012-01-26: qty 1

## 2012-01-26 MED ORDER — ONDANSETRON HCL 4 MG/2ML IJ SOLN: 4.0000 mg | Freq: Three times a day (TID) | INTRAMUSCULAR | Status: DC | PRN

## 2012-01-26 MED ORDER — ONDANSETRON HCL 4 MG PO TABS: 4.0000 mg | ORAL_TABLET | Freq: Four times a day (QID) | ORAL | Status: DC | PRN

## 2012-01-26 MED ORDER — ACETAMINOPHEN 650 MG RE SUPP: 650.0000 mg | Freq: Four times a day (QID) | RECTAL | Status: DC | PRN

## 2012-01-26 MED ORDER — SODIUM CHLORIDE 0.9 % IV SOLN: 250.0000 mL | INTRAVENOUS | Status: DC | PRN

## 2012-01-26 MED ORDER — SODIUM CHLORIDE 0.9 % IJ SOLN: 3.0000 mL | INTRAMUSCULAR | Status: DC | PRN

## 2012-01-26 NOTE — ED Provider Notes (Signed)
History     CSN: [id number]  Arrival date & time 01/25/12  2224   First MD Initiated Contact with Patient 01/25/12 2352      Chief Complaint  Patient presents with  . Nausea  . Abdominal Pain     The history is provided by the patient.   the patient was seen last week at an urgent care for generalized weakness and fatigue and was diagnosed with a possible urinary tract infection/prostatitis and started on ciprofloxacin.  He took 6 days his ciprofloxacin and they in developed nausea vomiting and abdominal discomfort 2 days ago.  He reports his symptoms are worse at this time.  His had several loose bowel movements.  Prior to ciprofloxacin he was not on antibiotics.  He has no prior history of pancreatitis.  Denies alcohol use/abuse.  He reports chills and subjective fever at home.  He points to his upper abdomen when he describes where his pain is.  His pain is mild to moderate at this time.  He's never had abdominal surgery before.  Denies chest pain or shortness of breath.  Past Medical History  Diagnosis Date  . Low back pain   . Asthma     No past surgical history on file.  Family History  Problem Relation Age of Onset  . Hypertension Other     History  Substance Use Topics  . Smoking status: Current Everyday Smoker  . Smokeless tobacco: Not on file  . Alcohol Use: Yes      Review of Systems  Gastrointestinal: Positive for abdominal pain.  All other systems reviewed and are negative.    Allergies  Review of patient's allergies indicates no known allergies.  Home Medications   Current Outpatient Rx  Name Route Sig Dispense Refill  . CIPROFLOXACIN HCL 100 MG PO TABS Oral Take 500 mg by mouth 2 (two) times daily. Patient is supposed to take this for 15 days,started a week prior      BP 134/82  Pulse 77  Temp(Src) 98.4 F (36.9 C) (Oral)  Resp 16  SpO2 98%  Physical Exam  Nursing note and vitals reviewed. Constitutional: He is oriented to person, place,  and time. He appears well-developed and well-nourished.  HENT:  Head: Normocephalic and atraumatic.  Eyes: EOM are normal.  Neck: Normal range of motion.  Cardiovascular: Normal rate, regular rhythm, normal heart sounds and intact distal pulses.   Pulmonary/Chest: Effort normal and breath sounds normal. No respiratory distress.  Abdominal: Soft. He exhibits no distension.       Mild epigastric tenderness without guarding or rebound.   Musculoskeletal: Normal range of motion.  Neurological: He is alert and oriented to person, place, and time.  Skin: Skin is warm and dry.  Psychiatric: He has a normal mood and affect. Judgment normal.    ED Course  Procedures (including critical care time)  Labs Reviewed  URINALYSIS, ROUTINE W REFLEX MICROSCOPIC - Abnormal; Notable for the following:    Hgb urine dipstick TRACE (*)    All other components within normal limits  BASIC METABOLIC PANEL - Abnormal; Notable for the following:    Glucose, Bld 105 (*)    Creatinine, Ser 2.96 (*)    GFR calc non Af Amer 25 (*)    GFR calc Af Amer 29 (*)    All other components within normal limits  CBC  LIPASE, BLOOD  HEPATIC FUNCTION PANEL  URINE MICROSCOPIC-ADD ON   Ct Abdomen Pelvis Wo Contrast  01/26/2012  *  RADIOLOGY REPORT*  Clinical Data: Nausea, abdominal pain.  CT ABDOMEN AND PELVIS WITHOUT CONTRAST  Technique:  Multidetector CT imaging of the abdomen and pelvis was performed following the standard protocol without intravenous contrast.  Comparison: None.  Findings: Minimal dependent atelectasis.  Organ abnormality/lesion detection is limited in the absence of intravenous contrast. Within this limitation, unremarkable liver, biliary system, pancreas, adrenal glands.  Splenomegaly, measuring 14.8 cm cranial caudal.  Minimal right perinephric fat stranding.  Symmetric renal size.  No hydronephrosis or hydroureter.  No bowel obstruction.  No CT evidence for colitis.  Normal appendix.  No free  intraperitoneal air or fluid.  No lymphadenopathy.  Normal caliber vasculature.  Thin-walled bladder.  Multilevel degenerative changes of the imaged spine. No acute or aggressive appearing osseous lesion.  IMPRESSION: Minimal right perinephric fat stranding.  Correlate with urinalysis if concerned for ascending infection.  Nonspecific splenomegaly.  Original Report Authenticated By: Waneta Martins, M.D.   1. Abdominal pain   2. Renal insufficiency       MDM  Pt with creatinine of 2.96 with normal BUN. This is likely chronic renal insufficiency, but new since the last 1 year where is creatine was 0.9. The pt takes nearly 600mg  of motrin 3 x a day for weeks. CT scan without acute pathology. Urine in unimpressive, however, will extend the pts course of abx for 5 more days and change from cipro to keflex. The pt assures me he will see his MD for recheck of his creatine on Monday June 10th, and if unable, will return to the ER for recheck of his creatinine. He will stop taking motrin. Hydration at home. Home with pain meds and antiemetics. No clear cause of his abdominal pain today. Dc home in good condition. Pt understands to return to the ER for new or worsening symptoms. He is also to follow up with nephrology, and contact numbers have been given          Lyanne Co, MD 01/26/12 [phone]

## 2012-01-26 NOTE — ED Notes (Signed)
Pt from home with reports of continued headache, abdominal pain, nausea and vomiting that started 6/5 with reports of feeling more tired a week prior. Pt reports being discharged from [facility] ED for same at 0500 this morning but symptoms returned.

## 2012-01-26 NOTE — ED Notes (Signed)
Report called to [id number]

## 2012-01-26 NOTE — ED Provider Notes (Signed)
History     CSN: [id number]  Arrival date & time 01/26/12  1249   First MD Initiated Contact with Patient 01/26/12 1330      Chief Complaint  Patient presents with  . Headache  . Abdominal Pain    umbilical area  . Nausea  . Emesis    (Consider location/radiation/quality/duration/timing/severity/associated sxs/prior treatment) HPI Comments: Patient presents with right upper quadrant abdominal pain and right flank pain that began on Wednesday.  It's been associated with some nausea and vomiting.  No dysuria or hematuria.  Patient states that he was seen a week and a half ago at an urgent care Center and diagnosed with urinary tract infection and started on ciprofloxacin.  He states he took his last pill for that on Wednesday.  He states he had these symptoms on Wednesday and he seemed to be getting better until yesterday.  The flank pain and nausea and vomiting have been worsening.  He states he said normal bowel movements.  He's also had some onset of headache without prior headache history.  Not sudden in onset.  He has no neck stiffness.  No specific fevers at home.  No prior abdominal surgeries or kidney stones.  Patient is a 41 y.o. male presenting with headaches, abdominal pain, and vomiting. The history is provided by the patient. No language interpreter was used.  Headache  This is a new problem. The current episode started more than 2 days ago. Associated symptoms include nausea and vomiting. Pertinent negatives include no fever and no shortness of breath.  Abdominal Pain The primary symptoms of the illness include abdominal pain, nausea and vomiting. The primary symptoms of the illness do not include fever or shortness of breath.  Additional symptoms associated with the illness include back pain. Symptoms associated with the illness do not include chills or hematuria.  Emesis  Associated symptoms include abdominal pain and headaches. Pertinent negatives include no chills, no cough  and no fever.    Past Medical History  Diagnosis Date  . Low back pain   . Asthma     History reviewed. No pertinent past surgical history.  Family History  Problem Relation Age of Onset  . Hypertension Other     History  Substance Use Topics  . Smoking status: Current Everyday Smoker -- 1.0 packs/day    Types: Cigarettes  . Smokeless tobacco: Never Used  . Alcohol Use: Yes      Review of Systems  Constitutional: Negative.  Negative for fever and chills.  Eyes: Negative.   Respiratory: Negative.  Negative for cough and shortness of breath.   Cardiovascular: Negative.  Negative for chest pain.  Gastrointestinal: Positive for nausea, vomiting and abdominal pain.  Genitourinary: Positive for flank pain. Negative for hematuria.  Musculoskeletal: Positive for back pain.  Skin: Negative.  Negative for color change and rash.  Neurological: Positive for headaches. Negative for syncope.  Hematological: Negative.  Negative for adenopathy.  Psychiatric/Behavioral: Negative.  Negative for confusion.  All other systems reviewed and are negative.    Allergies  Review of patient's allergies indicates no known allergies.  Home Medications   Current Outpatient Rx  Name Route Sig Dispense Refill  . CEPHALEXIN 500 MG PO CAPS Oral Take 1 capsule (500 mg total) by mouth 4 (four) times daily. 20 capsule 0  . ONDANSETRON 8 MG PO TBDP Oral Take 1 tablet (8 mg total) by mouth every 8 (eight) hours as needed for nausea. 12 tablet 0  . OXYCODONE-ACETAMINOPHEN 5-325  MG PO TABS Oral Take 1 tablet by mouth every 4 (four) hours as needed for pain. 25 tablet 0    BP 130/79  Pulse 66  Temp(Src) 97.8 F (36.6 C) (Oral)  Resp 18  SpO2 99%  Physical Exam  Nursing note and vitals reviewed. Constitutional: He is oriented to person, place, and time. He appears well-developed and well-nourished.  Non-toxic appearance. He does not have a sickly appearance.  HENT:  Head: Normocephalic and  atraumatic.  Eyes: Conjunctivae, EOM and lids are normal. Pupils are equal, round, and reactive to light.  Neck: Trachea normal, normal range of motion and full passive range of motion without pain. Neck supple.  Cardiovascular: Normal rate, regular rhythm and normal heart sounds.   Pulmonary/Chest: Effort normal and breath sounds normal. No respiratory distress. He has no wheezes. He has no rales.  Abdominal: Soft. Normal appearance. He exhibits no distension. There is tenderness. There is no rebound and no CVA tenderness.       Right upper quadrant tenderness on exam.  Genitourinary:       No CVA tenderness on exam  Musculoskeletal: Normal range of motion.  Neurological: He is alert and oriented to person, place, and time. He has normal strength.  Skin: Skin is warm, dry and intact. No rash noted.  Psychiatric: He has a normal mood and affect. His behavior is normal. Judgment and thought content normal.    ED Course  Procedures (including critical care time)   Labs Reviewed  CBC  DIFFERENTIAL  COMPREHENSIVE METABOLIC PANEL  LIPASE, BLOOD   Ct Abdomen Pelvis Wo Contrast  01/26/2012  *RADIOLOGY REPORT*  Clinical Data: Nausea, abdominal pain.  CT ABDOMEN AND PELVIS WITHOUT CONTRAST  Technique:  Multidetector CT imaging of the abdomen and pelvis was performed following the standard protocol without intravenous contrast.  Comparison: None.  Findings: Minimal dependent atelectasis.  Organ abnormality/lesion detection is limited in the absence of intravenous contrast. Within this limitation, unremarkable liver, biliary system, pancreas, adrenal glands.  Splenomegaly, measuring 14.8 cm cranial caudal.  Minimal right perinephric fat stranding.  Symmetric renal size.  No hydronephrosis or hydroureter.  No bowel obstruction.  No CT evidence for colitis.  Normal appendix.  No free intraperitoneal air or fluid.  No lymphadenopathy.  Normal caliber vasculature.  Thin-walled bladder.  Multilevel  degenerative changes of the imaged spine. No acute or aggressive appearing osseous lesion.  IMPRESSION: Minimal right perinephric fat stranding.  Correlate with urinalysis if concerned for ascending infection.  Nonspecific splenomegaly.  Original Report Authenticated By: Waneta Martins, M.D.     No diagnosis found.    MDM  Patient with persistent abdominal and right flank pain.  He's noted some persistent nausea as well that is unclear if it's related to eating since patient has had significantly decreased by mouth intake over the last few days.  Since patient did have some right upper quadrant tenderness on my exam I will obtain an abdominal ultrasound to look for gallstones or cholecystitis despite patient's lack of abnormalities on his laboratory studies completed last night.  Patient's headache is of unclear origin but as this is later in onset and this process it did not feel the patient has an acute subarachnoid hemorrhage or meningitis.  The headache is likely related to dehydration and overall generalized illness at this time.  Patient's urinalysis earlier did not show infection despite the findings of minimal perinephric stranding on the right which does correlate with the patient's area of pain.  Patient  was discharged on Keflex by the prior physician presumably as treatment for possible urinary tract infection.        Nat Christen, MD 01/26/12 [phone]

## 2012-01-26 NOTE — H&P (Signed)
PCP:   Sanda Linger, MD, MD   Chief Complaint:  Vomiting and flank pain. Recent UTI.  HPI: This is a 41 year old male, with known history of childhood/excercise-induced bronchial asthma, chronic low back pain, for over 15 years, for which he takes Motrin as needed, and tobacco use, but otherwise, no significant medical history, who presents with above symptoms. According to patient he felt unwell for about 2 weeks, with generalized malise, but no urinary tract symptoms. On 01/17/12, he went to an Urgent Care Center, was diagnosed with urinary tract infection/possible prostatitis, and started on a 15 day course of Ciprofloxacin, which he only took for 5 days (till  01/22/12). On 01/22/12, he developed right upper quadrant abdominal and right flank pain, associated with  Nausea, and started vomiting from 01/23/12, about 2-3/day. He had no ever, but has had chills. By 01/24/12, he developed diarrhea with watery stools up to 3 times daily, without passage of blood. Oral intake has been poor. No dysuria, frequency or hematuria. He presented to the ED on 01/25/12, was evaluated, given iv fluids, and discharged on Keflex, Zofran and Percocet at about 5:00 AM on 01/26/12. He did fill the prescriptions, got home and went to bed at about 9:00 AM His wife brought him Zofran and Keflex when he awoke, he took these and promptly threw up. Spouse brought him back to the ED, as symptoms have not improved.     Allergies:  No Known Allergies    Past Medical History  Diagnosis Date  . Low back pain   . Asthma     History reviewed. No pertinent past surgical history.  Prior to Admission medications   Medication Sig Start Date End Date Taking? Authorizing Provider  cephALEXin (KEFLEX) 500 MG capsule Take 1 capsule (500 mg total) by mouth 4 (four) times daily. 01/26/12 02/05/12 Yes Lyanne Co, MD  ondansetron (ZOFRAN ODT) 8 MG disintegrating tablet Take 1 tablet (8 mg total) by mouth every 8 (eight) hours as needed for  nausea. 01/26/12 02/02/12 Yes Lyanne Co, MD  oxyCODONE-acetaminophen (PERCOCET) 5-325 MG per tablet Take 1 tablet by mouth every 4 (four) hours as needed for pain. 01/26/12 02/05/12  Lyanne Co, MD    Social History: Married with 4 offspring. He reports that he has been smoking Cigarettes.  He has been smoking about 1 pack per day. He has never used smokeless tobacco. He reports that he drinks alcohol occasionally. He reports that he does not use illicit drugs.  Family History  Problem Relation Age of Onset  . Hypertension Other     Review of Systems:  As per HPI and chief complaint. Patent has fatigue, diminished appetite, chills, headache, denies blurred vision, difficulty in speaking, dysphagia, chest pain, cough, shortness of breath, orthopnea, paroxysmal nocturnal dyspnea, diaphoresis, belching, heartburn, hematemesis, melena, dysuria, nocturia, urinary frequency, hematochezia, lower extremity swelling, pain, or redness. The rest of the systems review is negative.  Physical Exam:  General:  Patient does not appear to be in obvious acute distress. Alert, communicative, fully oriented, talking in complete sentences, not short of breath at rest.  HEENT:  No clinical pallor, no jaundice, no conjunctival injection or discharge. Buccal mucous membranes appear "dry". NECK:  Supple, JVP not seen, no carotid bruits, no palpable lymphadenopathy, no palpable goiter. CHEST:  Clinically clear to auscultation, no wheezes, no crackles. HEART:  Sounds 1 and 2 heard, normal, regular, no murmurs. ABDOMEN:  Flat, soft, mildly tender in epigastrium, RUQ and RLQ,  no palpable organomegaly, no palpable masses, normal bowel sounds. No renal angle tenderness.  GENITALIA:  Not examined. LOWER EXTREMITIES:  No pitting edema, palpable peripheral pulses. MUSCULOSKELETAL SYSTEM:  Unremarkable. CENTRAL NERVOUS SYSTEM:  No focal neurologic deficit on gross examination.  Labs on Admission:  Results for orders  placed during the hospital encounter of 01/26/12 (from the past 48 hour(s))  CBC     Status: Abnormal   Collection Time   01/26/12  2:42 PM      Component Value Range Comment   WBC 9.4  4.0 - 10.5 (K/uL)    RBC 4.60  4.22 - 5.81 (MIL/uL)    Hemoglobin 14.6  13.0 - 17.0 (g/dL)    HCT 40.9  81.1 - 91.4 (%)    MCV 87.4  78.0 - 100.0 (fL)    MCH 31.7  26.0 - 34.0 (pg)    MCHC 36.3 (*) 30.0 - 36.0 (g/dL)    RDW 78.2  95.6 - 21.3 (%)    Platelets 167  150 - 400 (K/uL)   DIFFERENTIAL     Status: Abnormal   Collection Time   01/26/12  2:42 PM      Component Value Range Comment   Neutrophils Relative 83 (*) 43 - 77 (%)    Neutro Abs 7.9 (*) 1.7 - 7.7 (K/uL)    Lymphocytes Relative 11 (*) 12 - 46 (%)    Lymphs Abs 1.1  0.7 - 4.0 (K/uL)    Monocytes Relative 5  3 - 12 (%)    Monocytes Absolute 0.5  0.1 - 1.0 (K/uL)    Eosinophils Relative 0  0 - 5 (%)    Eosinophils Absolute 0.0  0.0 - 0.7 (K/uL)    Basophils Relative 0  0 - 1 (%)    Basophils Absolute 0.0  0.0 - 0.1 (K/uL)   COMPREHENSIVE METABOLIC PANEL     Status: Abnormal   Collection Time   01/26/12  2:42 PM      Component Value Range Comment   Sodium 140  135 - 145 (mEq/L)    Potassium 4.6  3.5 - 5.1 (mEq/L)    Chloride 104  96 - 112 (mEq/L)    CO2 25  19 - 32 (mEq/L)    Glucose, Bld 101 (*) 70 - 99 (mg/dL)    BUN 19  6 - 23 (mg/dL)    Creatinine, Ser 0.86 (*) 0.50 - 1.35 (mg/dL)    Calcium 9.0  8.4 - 10.5 (mg/dL)    Total Protein 7.3  6.0 - 8.3 (g/dL)    Albumin 3.9  3.5 - 5.2 (g/dL)    AST 14  0 - 37 (U/L)    ALT 24  0 - 53 (U/L)    Alkaline Phosphatase 96  39 - 117 (U/L)    Total Bilirubin 0.6  0.3 - 1.2 (mg/dL)    GFR calc non Af Amer 29 (*) >90 (mL/min)    GFR calc Af Amer 34 (*) >90 (mL/min)   LIPASE, BLOOD     Status: Normal   Collection Time   01/26/12  2:42 PM      Component Value Range Comment   Lipase 26  11 - 59 (U/L)     Radiological Exams on Admission: *RADIOLOGY REPORT*  Clinical Data: Nausea, abdominal  pain.  CT ABDOMEN AND PELVIS WITHOUT CONTRAST  Technique: Multidetector CT imaging of the abdomen and pelvis was  performed following the standard protocol without intravenous  contrast.  Comparison: None.  Findings: Minimal dependent atelectasis.  Organ abnormality/lesion detection is limited in the absence of  intravenous contrast. Within this limitation, unremarkable liver,  biliary system, pancreas, adrenal glands. Splenomegaly, measuring  14.8 cm cranial caudal.  Minimal right perinephric fat stranding. Symmetric renal size. No  hydronephrosis or hydroureter.  No bowel obstruction. No CT evidence for colitis. Normal  appendix. No free intraperitoneal air or fluid. No  lymphadenopathy. Normal caliber vasculature.  Thin-walled bladder.  Multilevel degenerative changes of the imaged spine. No acute or  aggressive appearing osseous lesion.  IMPRESSION:  Minimal right perinephric fat stranding. Correlate with urinalysis  if concerned for ascending infection.  Nonspecific splenomegaly.  Original Report Authenticated By: Waneta Martins, M.D.  *RADIOLOGY REPORT*  Clinical Data: Abdominal pain, increased creatinine  COMPLETE ABDOMINAL ULTRASOUND  Comparison: CT scan same day  Findings:  Gallbladder: No gallstones, gallbladder wall thickening, or  pericholecystic fluid. No sonographic Murphy's sign  Common bile duct: Measures 4 mm in diameter within normal limits.  Liver: No focal lesion identified. Within normal limits in  parenchymal echogenicity.  IVC: Appears normal.  Pancreas: No focal abnormality seen.  Spleen: Measures 0.2 cm in length. Normal echogenicity.  Right Kidney: Measures 14 cm in length. No mass, hydronephrosis  or diagnostic renal calculus  Left Kidney: Measures 14.6 cm in length. No mass, hydronephrosis  or diagnostic renal calculus  Abdominal aorta: No aneurysm identified. Measures up to 2.2 cm in  diameter.  IMPRESSION:  Negative abdominal ultrasound.    Original Report Authenticated By: Natasha Mead, M.D.  Assessment/Plan Active Problems: 1. Acute Gastroenteritis: Presenting with a 4-5 day history of abdominal pain, vomiting and diarrhea, against a background of recent antibiotic treatment for UTI/Acute right pyelonephritis. Lipase is normal at 26, effectively ruling out acute pancreatitis. We shall manage with bowel rest, iv fluids, antiemetics and PPI. Check C. Difficile PCR. 2. Dehydration: Secondary to nausea and vomiting/diarrhea, as well as poor oral intake, occasioned by recent acute illness. Will manage with iv fluids.  3. ARF (acute renal failure): Likely pre-renal etiology, due to dehydration and volume depletion. Per EMR, baseline creatinine was 0.9 on 01/21/12. Fortunately, he has no evidence of obstructive uropathy, or calculi on imaging studies. He has however, been taking Motrin 800 m about twice-three times daily, prn, chronically, for back pain, so this may be contributory. NSAIDS will be discontinued. Satisfactory response to iv fluids, is anticipated, and we shall attempt to ascertain a more recent baseline creatinine from patient's PMD, on 01/27/12.  4. Tobacco abuse: Smokes about a pack of cigarettes per day, and has done so for several years. Counseled. Will manage with Nicoderm CQ patch.  5. Acute pyelonephritis: Recently diagnosed and treated with course of Ciprofloxacin, completed on 01/22/12, after 5 days Rx. Urinalysis is negative, although patient has minimal perinephric stranding of right kidney. Likely, this is partially treated. As he has no dysuria, wcc is normal, and he is apyrexial, will not recommence antibiotic therapy at this time, particularly, if C.Difficile is a concern. Will send off urine culture.  6. Asthma: No clinical evidence of asthma exacerbation, at this time. Patient utilizes Symbicort for this, occasionally.  Further management will depend on clinical course.  Time Spent on Admission: 45  mins.  Tobin Witucki,CHRISTOPHER 01/26/2012, 5:47 PM

## 2012-01-26 NOTE — ED Notes (Signed)
Pt returns from CT, cont to c/o pain, MD made aware, orders obtained

## 2012-01-26 NOTE — ED Notes (Signed)
Pt. given urinal; advised need urine sample 

## 2012-01-26 NOTE — ED Provider Notes (Signed)
Ultrasound is normal. Discussed with the [facility] hospitalist for admission given the new onset renal insufficiency. He warrants additional IV hydration in hospital. Will be admitted  Dayton Bailiff, MD 01/26/12 1728

## 2012-01-27 DIAGNOSIS — F17200 Nicotine dependence, unspecified, uncomplicated: Secondary | ICD-10-CM

## 2012-01-27 DIAGNOSIS — E860 Dehydration: Secondary | ICD-10-CM

## 2012-01-27 DIAGNOSIS — A088 Other specified intestinal infections: Secondary | ICD-10-CM

## 2012-01-27 DIAGNOSIS — N179 Acute kidney failure, unspecified: Secondary | ICD-10-CM

## 2012-01-27 LAB — COMPREHENSIVE METABOLIC PANEL
AST: 13 U/L (ref 0–37)
Albumin: 3.7 g/dL (ref 3.5–5.2)
BUN: 20 mg/dL (ref 6–23)
Chloride: 106 mEq/L (ref 96–112)
Creatinine, Ser: 2.3 mg/dL — ABNORMAL HIGH (ref 0.50–1.35)
GFR calc Af Amer: 39 mL/min — ABNORMAL LOW (ref >90)
GFR calc non Af Amer: 34 mL/min — ABNORMAL LOW (ref >90)
Total Protein: 6.8 g/dL (ref 6.0–8.3)

## 2012-01-27 LAB — CBC
MCHC: 35.4 g/dL (ref 30.0–36.0)
Platelets: 162 10*3/uL (ref 150–400)
RDW: 12.2 % (ref 11.5–15.5)

## 2012-01-27 MED ORDER — HYDROMORPHONE HCL PF 1 MG/ML IJ SOLN
1.0000 mg | INTRAMUSCULAR | Status: DC | PRN
  Administered 2012-01-27: 1 mg via INTRAVENOUS
  Administered 2012-01-27: 0.5 mg via INTRAVENOUS
  Administered 2012-01-28 – 2012-01-29 (×3): 1 mg via INTRAVENOUS
  Filled 2012-01-27 (×5): qty 1

## 2012-01-27 MED ORDER — LOPERAMIDE HCL 2 MG PO CAPS
2.0000 mg | ORAL_CAPSULE | Freq: Three times a day (TID) | ORAL | Status: DC | PRN
  Administered 2012-01-28: 2 mg via ORAL
  Filled 2012-01-27: qty 1

## 2012-01-27 MED ORDER — TRAMADOL HCL 50 MG PO TABS
50.0000 mg | ORAL_TABLET | Freq: Four times a day (QID) | ORAL | Status: DC
  Administered 2012-01-27 – 2012-01-30 (×11): 50 mg via ORAL
  Filled 2012-01-27 (×17): qty 1

## 2012-01-27 NOTE — Progress Notes (Signed)
Subjective: Feels better, no vomiting overnight. Had watery stool this AM.   Objective: Vital signs in last 24 hours: Temp:  [97.5 F (36.4 C)-98.8 F (37.1 C)] 97.5 F (36.4 C) (06/10 0443) Pulse Rate:  [66-74] 70  (06/10 0443) Resp:  [18-20] 18  (06/10 0443) BP: (130-147)/(69-91) 136/87 mmHg (06/10 0443) SpO2:  [96 %-99 %] 96 % (06/10 0443) Weight:  [107.502 kg (237 lb)] 107.502 kg (237 lb) (06/09 1939) Weight change:  Last BM Date: 01/26/12  Intake/Output from previous day: 06/09 0701 - 06/10 0700 In: 1012.5 [I.V.:1012.5] Out: 250 [Urine:250] Total I/O In: -  Out: 700 [Urine:700]   Physical Exam: General: Comfortable. Alert, communicative, fully oriented, talking in complete sentences, not short of breath at rest.  HEENT: No clinical pallor, no jaundice, no conjunctival injection or discharge. Hydration status has improved. NECK: Supple, JVP not seen, no carotid bruits, no palpable lymphadenopathy, no palpable goiter.  CHEST: Clinically clear to auscultation, no wheezes, no crackles.  HEART: Sounds 1 and 2 heard, normal, regular, no murmurs.  ABDOMEN: Flat, soft, only mild discomfort, no palpable organomegaly, no palpable masses, normal bowel sounds. No renal angle tenderness.  GENITALIA: Not examined.  LOWER EXTREMITIES: No pitting edema, palpable peripheral pulses.  MUSCULOSKELETAL SYSTEM: Unremarkable.  CENTRAL NERVOUS SYSTEM: No focal neurologic deficit on gross examination.  Lab Results:  Basename 01/27/12 0416 01/26/12 1442  WBC 8.6 9.4  HGB 14.0 14.6  HCT 39.6 40.2  PLT 162 167    Basename 01/27/12 0416 01/26/12 1442  NA 141 140  K 4.0 4.6  CL 106 104  CO2 25 25  GLUCOSE 87 101*  BUN 20 19  CREATININE 2.30* 2.57*  CALCIUM 9.0 9.0   Recent Results (from the past 240 hour(s))  CLOSTRIDIUM DIFFICILE BY PCR     Status: Normal   Collection Time   01/27/12  4:49 AM      Component Value Range Status Comment   C difficile by pcr NEGATIVE  NEGATIVE  Final       Studies/Results: Ct Abdomen Pelvis Wo Contrast  01/26/2012  *RADIOLOGY REPORT*  Clinical Data: Nausea, abdominal pain.  CT ABDOMEN AND PELVIS WITHOUT CONTRAST  Technique:  Multidetector CT imaging of the abdomen and pelvis was performed following the standard protocol without intravenous contrast.  Comparison: None.  Findings: Minimal dependent atelectasis.  Organ abnormality/lesion detection is limited in the absence of intravenous contrast. Within this limitation, unremarkable liver, biliary system, pancreas, adrenal glands.  Splenomegaly, measuring 14.8 cm cranial caudal.  Minimal right perinephric fat stranding.  Symmetric renal size.  No hydronephrosis or hydroureter.  No bowel obstruction.  No CT evidence for colitis.  Normal appendix.  No free intraperitoneal air or fluid.  No lymphadenopathy.  Normal caliber vasculature.  Thin-walled bladder.  Multilevel degenerative changes of the imaged spine. No acute or aggressive appearing osseous lesion.  IMPRESSION: Minimal right perinephric fat stranding.  Correlate with urinalysis if concerned for ascending infection.  Nonspecific splenomegaly.  Original Report Authenticated By: Waneta Martins, M.D.   US Abdomen Complete  01/26/2012  *RADIOLOGY REPORT*  Clinical Data:  Abdominal pain, increased creatinine  COMPLETE ABDOMINAL ULTRASOUND  Comparison:  CT scan same day  Findings:  Gallbladder:  No gallstones, gallbladder wall thickening, or pericholecystic fluid. No sonographic Murphy's sign  Common bile duct:  Measures 4 mm in diameter within normal limits.  Liver:  No focal lesion identified.  Within normal limits in parenchymal echogenicity.  IVC:  Appears normal.  Pancreas:  No  focal abnormality seen.  Spleen:  Measures 0.2 cm in length.  Normal echogenicity.  Right Kidney:  Measures 14 cm in length.  No mass, hydronephrosis or diagnostic renal calculus  Left Kidney:  Measures 14.6 cm in length.  No mass, hydronephrosis or diagnostic renal calculus   Abdominal aorta:  No aneurysm identified. Measures up to 2.2 cm in diameter.  IMPRESSION: Negative abdominal ultrasound.  Original Report Authenticated By: Natasha Mead, M.D.    Medications: Scheduled Meds:   . diphenhydrAMINE  12.5 mg Intravenous Once  . fentaNYL  100 mcg Intravenous Once  . heparin  5,000 Units Subcutaneous Q8H  . nicotine  21 mg Transdermal Daily  . pantoprazole (PROTONIX) IV  40 mg Intravenous Q24H  . promethazine  12.5 mg Intravenous Once  . sodium chloride  1,000 mL Intravenous Once  . sodium chloride  1,000 mL Intravenous Once  . sodium chloride  3 mL Intravenous Q12H  . DISCONTD: sodium chloride   Intravenous STAT   Continuous Infusions:   . sodium chloride 125 mL/hr at 01/27/12 0543   PRN Meds:.sodium chloride, acetaminophen, acetaminophen, HYDROmorphone (DILAUDID) injection, ondansetron (ZOFRAN) IV, ondansetron, sodium chloride, DISCONTD:  HYDROmorphone (DILAUDID) injection, DISCONTD: ondansetron (ZOFRAN) IV  Assessment/Plan: Active Problems:  1. Acute Gastroenteritis: Presenting with a 4-5 day history of abdominal pain, vomiting and diarrhea, against a background of recent antibiotic treatment for UTI/Acute right pyelonephritis. Lipase is normal at 26, effectively ruling out acute pancreatitis. Managing with bowel rest, iv fluids, antiemetics and PPI, Overnight, patient already feels better. He has had no vomiting overnight. Will commence clears today. . C. Difficile PCR is negative. Likely, patient has a self-limiting viral gastroenteritis. We shall start prn Loperamide.  2. Dehydration: Secondary to nausea and vomiting/diarrhea, as well as poor oral intake, occasioned by recent acute illness. Improving with iv fluids.  3. ARF (acute renal failure): Likely pre-renal etiology, due to dehydration and volume depletion. Per EMR, baseline creatinine was 0.9 on 01/21/12. Fortunately, he has no evidence of obstructive uropathy, or calculi on imaging studies. He has  however, been taking Motrin 800 m about twice-three times daily, prn, chronically, for back pain, so this may be contributory. NSAIDS will be discontinued. Satisfactory response to iv fluids, is anticipated, and and today, creatinine is 2.3.  4. Tobacco abuse: Smokes about a pack of cigarettes per day, and has done so for several years. Counseled. Managing with Nicoderm CQ patch.  5. Acute pyelonephritis: Recently diagnosed and treated with course of Ciprofloxacin, completed on 01/22/12, after 5 days Rx. Urinalysis is negative, although patient has minimal perinephric stranding of right kidney. Likely, this is partially treated. As he has no dysuria, wcc is normal, and he is apyrexial, will not recommence antibiotic therapy at this time. Urine culture is still pending.  6. Asthma: No clinical evidence of asthma exacerbation, at this time. Patient utilizes Symbicort for this, occasionally.       LOS: 1 day   Matthew [name],CHRISTOPHER 01/27/2012, 11:21 AM

## 2012-01-28 ENCOUNTER — Inpatient Hospital Stay (HOSPITAL_COMMUNITY): Payer: 59

## 2012-01-28 DIAGNOSIS — A088 Other specified intestinal infections: Secondary | ICD-10-CM

## 2012-01-28 DIAGNOSIS — E860 Dehydration: Secondary | ICD-10-CM

## 2012-01-28 DIAGNOSIS — N179 Acute kidney failure, unspecified: Secondary | ICD-10-CM

## 2012-01-28 DIAGNOSIS — F17200 Nicotine dependence, unspecified, uncomplicated: Secondary | ICD-10-CM

## 2012-01-28 LAB — CBC
MCH: 30.7 pg (ref 26.0–34.0)
MCV: 87.7 fL (ref 78.0–100.0)
Platelets: 155 10*3/uL (ref 150–400)
RBC: 4.23 MIL/uL (ref 4.22–5.81)
RDW: 12.2 % (ref 11.5–15.5)
WBC: 6.2 10*3/uL (ref 4.0–10.5)

## 2012-01-28 LAB — COMPREHENSIVE METABOLIC PANEL
Alkaline Phosphatase: 81 U/L (ref 39–117)
BUN: 16 mg/dL (ref 6–23)
Chloride: 103 mEq/L (ref 96–112)
GFR calc Af Amer: 54 mL/min — ABNORMAL LOW (ref >90)
GFR calc non Af Amer: 47 mL/min — ABNORMAL LOW (ref >90)
Glucose, Bld: 100 mg/dL — ABNORMAL HIGH (ref 70–99)
Sodium: 139 mEq/L (ref 135–145)
Total Bilirubin: 0.6 mg/dL (ref 0.3–1.2)
Total Protein: 6.4 g/dL (ref 6.0–8.3)

## 2012-01-28 LAB — URINE CULTURE
Culture  Setup Time: 201306100209
Culture: NO GROWTH

## 2012-01-28 MED ORDER — PROMETHAZINE HCL 25 MG/ML IJ SOLN
12.5000 mg | INTRAMUSCULAR | Status: AC | PRN
  Administered 2012-01-28 (×2): 12.5 mg via INTRAVENOUS
  Filled 2012-01-28 (×2): qty 1

## 2012-01-28 MED ORDER — ACETAMINOPHEN 325 MG PO TABS
650.0000 mg | ORAL_TABLET | Freq: Four times a day (QID) | ORAL | Status: DC
  Administered 2012-01-28 – 2012-01-29 (×7): 650 mg via ORAL
  Filled 2012-01-28 (×11): qty 2

## 2012-01-28 NOTE — Progress Notes (Signed)
Subjective: Complains of severe/persistent head ache. Vomited once at 2:00 AM, at time of headache.  Objective: Vital signs in last 24 hours: Temp:  [97.6 F (36.4 C)-98.3 F (36.8 C)] 97.6 F (36.4 C) (06/11 0601) Pulse Rate:  [58-65] 58  (06/11 0601) Resp:  [18-20] 18  (06/11 0601) BP: (126-135)/(79-86) 130/86 mmHg (06/11 0601) SpO2:  [97 %-98 %] 98 % (06/11 0601) Weight change:  Last BM Date: 01/27/12  Intake/Output from previous day: 06/10 0701 - 06/11 0700 In: 4136.7 [P.O.:520; I.V.:3616.7] Out: 3500 [Urine:3500]     Physical Exam: General: Comfortable. Alert, communicative, fully oriented, talking in complete sentences, not short of breath at rest.  HEENT: No clinical pallor, no jaundice, no conjunctival injection or discharge. Hydration status is satisafactory. NECK: Supple, JVP not seen, no carotid bruits, no palpable lymphadenopathy, no palpable goiter.  CHEST: Clinically clear to auscultation, no wheezes, no crackles.  HEART: Sounds 1 and 2 heard, normal, regular, no murmurs.  ABDOMEN: Flat, soft, only mild discomfort, no palpable organomegaly, no palpable masses, normal bowel sounds. No renal angle tenderness.  GENITALIA: Not examined.  LOWER EXTREMITIES: No pitting edema, palpable peripheral pulses.  MUSCULOSKELETAL SYSTEM: Unremarkable.  CENTRAL NERVOUS SYSTEM: No focal neurologic deficit on gross examination.  Lab Results:  Basename 01/28/12 0418 01/27/12 0416  WBC 6.2 8.6  HGB 13.0 14.0  HCT 37.1* 39.6  PLT 155 162    Basename 01/28/12 0418 01/27/12 0416  NA 139 141  K 4.3 4.0  CL 103 106  CO2 27 25  GLUCOSE 100* 87  BUN 16 20  CREATININE 1.75* 2.30*  CALCIUM 8.7 9.0   Recent Results (from the past 240 hour(s))  CLOSTRIDIUM DIFFICILE BY PCR     Status: Normal   Collection Time   01/27/12  4:49 AM      Component Value Range Status Comment   C difficile by pcr NEGATIVE  NEGATIVE  Final      Studies/Results: US Abdomen Complete  01/26/2012   *RADIOLOGY REPORT*  Clinical Data:  Abdominal pain, increased creatinine  COMPLETE ABDOMINAL ULTRASOUND  Comparison:  CT scan same day  Findings:  Gallbladder:  No gallstones, gallbladder wall thickening, or pericholecystic fluid. No sonographic Murphy's sign  Common bile duct:  Measures 4 mm in diameter within normal limits.  Liver:  No focal lesion identified.  Within normal limits in parenchymal echogenicity.  IVC:  Appears normal.  Pancreas:  No focal abnormality seen.  Spleen:  Measures 0.2 cm in length.  Normal echogenicity.  Right Kidney:  Measures 14 cm in length.  No mass, hydronephrosis or diagnostic renal calculus  Left Kidney:  Measures 14.6 cm in length.  No mass, hydronephrosis or diagnostic renal calculus  Abdominal aorta:  No aneurysm identified. Measures up to 2.2 cm in diameter.  IMPRESSION: Negative abdominal ultrasound.  Original Report Authenticated By: Natasha Mead, M.D.    Medications: Scheduled Meds:    . heparin  5,000 Units Subcutaneous Q8H  . nicotine  21 mg Transdermal Daily  . pantoprazole (PROTONIX) IV  40 mg Intravenous Q24H  . sodium chloride  3 mL Intravenous Q12H  . traMADol  50 mg Oral Q6H   Continuous Infusions:    . sodium chloride 150 mL/hr at 01/28/12 0234   PRN Meds:.sodium chloride, acetaminophen, acetaminophen, HYDROmorphone (DILAUDID) injection, loperamide, ondansetron (ZOFRAN) IV, ondansetron, promethazine, sodium chloride, DISCONTD:  HYDROmorphone (DILAUDID) injection  Assessment/Plan: Active Problems:  1. Acute Gastroenteritis: Presenting with a 4-5 day history of abdominal pain, vomiting and diarrhea,  against a background of recent antibiotic treatment for UTI/Acute right pyelonephritis. Lipase is normal at 26, effectively ruling out acute pancreatitis. Managing with bowel rest, iv fluids, antiemetics and PPI, Patient already feels better. C. Difficile PCR is negative. Likely, patient has a self-limiting viral gastroenteritis. Diarrhea has ceased. On  prn Loperamide. Tolerating clears. Will advance diet today.  2. Dehydration: Secondary to nausea and vomiting/diarrhea, as well as poor oral intake, occasioned by recent acute illness. Improving steadily with iv fluids.  3. ARF (acute renal failure): Likely pre-renal etiology, due to dehydration and volume depletion. Per EMR, baseline creatinine was 0.9 on 01/21/12. Fortunately, he has no evidence of obstructive uropathy, or calculi on imaging studies. He has however, been taking Motrin 800 m about twice-three times daily, prn, chronically, for back pain, so this may be contributory. NSAIDS will be discontinued. Satisfactory response to iv fluids, is anticipated, and and today, creatinine is 1.75.  4. Tobacco abuse: Smokes about a pack of cigarettes per day, and has done so for several years. Counseled. Managing with Nicoderm CQ patch.  5. Acute pyelonephritis: Recently diagnosed and treated with course of Ciprofloxacin, completed on 01/22/12, after 5 days Rx. Urinalysis is negative, although patient has minimal perinephric stranding of right kidney. Likely, this is partially treated. As he has no dysuria, wcc is normal, and he is apyrexial, will not recommence antibiotic therapy at this time. Urine culture is negative so far.  6. Asthma: No clinical evidence of asthma exacerbation, at this time. Patient utilizes Symbicort for this, occasionally.  7. Headache: This has been quite intractable, since admission, and may be part of a viral syndrome. Managing with analgesics, but still persistent. Will check head CT scan.     LOS: 2 days   Matthew [name],CHRISTOPHER 01/28/2012, 10:11 AM

## 2012-01-29 DIAGNOSIS — A088 Other specified intestinal infections: Secondary | ICD-10-CM

## 2012-01-29 DIAGNOSIS — E860 Dehydration: Secondary | ICD-10-CM

## 2012-01-29 DIAGNOSIS — N179 Acute kidney failure, unspecified: Secondary | ICD-10-CM

## 2012-01-29 DIAGNOSIS — F17200 Nicotine dependence, unspecified, uncomplicated: Secondary | ICD-10-CM

## 2012-01-29 LAB — COMPREHENSIVE METABOLIC PANEL
ALT: 19 U/L (ref 0–53)
AST: 13 U/L (ref 0–37)
Albumin: 3.2 g/dL — ABNORMAL LOW (ref 3.5–5.2)
Alkaline Phosphatase: 75 U/L (ref 39–117)
Chloride: 104 mEq/L (ref 96–112)
Potassium: 3.6 mEq/L (ref 3.5–5.1)
Sodium: 138 mEq/L (ref 135–145)
Total Bilirubin: 0.4 mg/dL (ref 0.3–1.2)

## 2012-01-29 LAB — CBC
HCT: 37.6 % — ABNORMAL LOW (ref 39.0–52.0)
Platelets: 167 10*3/uL (ref 150–400)
RDW: 12.3 % (ref 11.5–15.5)
WBC: 5.7 10*3/uL (ref 4.0–10.5)

## 2012-01-29 MED ORDER — PANTOPRAZOLE SODIUM 40 MG PO TBEC
40.0000 mg | DELAYED_RELEASE_TABLET | Freq: Every day | ORAL | Status: DC
  Administered 2012-01-29: 40 mg via ORAL
  Filled 2012-01-29: qty 1

## 2012-01-29 MED ORDER — ACETAMINOPHEN 325 MG PO TABS: 650.0000 mg | ORAL_TABLET | ORAL | Status: DC | PRN

## 2012-01-29 MED ORDER — HYDROMORPHONE HCL PF 1 MG/ML IJ SOLN: 0.5000 mg | INTRAMUSCULAR | Status: DC | PRN

## 2012-01-29 NOTE — Progress Notes (Signed)
Pharmacy: IV to PO Protonix  Patient has been receiving IV Protonix. Per Pharmacy and Therapeutics Committee, this patient meets criteria for auto conversion to PO Protonix:   Tolerating a diet of full liquids or better  Tolerating other medications via enteral route  No GIB  Shamariah Shewmake PharmD  [phone] 09/16/2011 8:54 AM    

## 2012-01-29 NOTE — Progress Notes (Signed)
PROGRESS NOTE  Matthew [name] NWG:[id number] DOB: Jul 09, 1971 DOA: 01/26/2012 PCP: Sanda Linger, MD  Brief narrative: 41 yr old Male admitted with Vomiting + Flank pain and recent UTI  Past medical history: Childhood asthma Chronic LBP Tobacco abuse  Consultants:  none  Procedures: CT abd with contrast 01/26/12=Minimal right perinephric fat stranding. Correlate with urinalysis  if concerned for ascending infection.  Nonspecific splenomegaly.  US abdomen 6/9=IMPRESSION:  Negative abdominal ultrasound.   Ct head 6/11=IMPRESSION:  No acute intracranial findings.   Antibiotics:  none   Subjective  Feels better. Not 100% yet. Headaches seem a lot better -asks if he can be on meds other than Dilaudid for his headache.  Headache is located at the top of head and radiates down paravertebral muscles. Warm patella seems to help with the pain from the headache Tolerating by mouth fairly well. No further emesis. No diarrhea currently-received Imodium yesterday No chest pain or shortness of breath.  Objective    Interim History: Chart reviewed   Objective: Filed Vitals:   01/28/12 0601 01/28/12 1400 01/28/12 2209 01/29/12 0559  BP: 130/86 122/77 125/78 119/79  Pulse: 58 64 68 77  Temp: 97.6 F (36.4 C) 97.4 F (36.3 C) 98.5 F (36.9 C) 97.4 F (36.3 C)  TempSrc: Oral Oral Oral Oral  Resp: 18 18 18 18   Height:      Weight:      SpO2: 98% 97% 98% 98%    Intake/Output Summary (Last 24 hours) at 01/29/12 1256 Last data filed at 01/29/12 1000  Gross per 24 hour  Intake   4620 ml  Output   5900 ml  Net  -1280 ml    Exam:  General: Oriented well-groomed Caucasian male in no apparent distress Cardiovascular: S1-S2 no murmur rub or gallop Respiratory: Clinically clear no added sound Abdomen: Soft nontender nondistended Skin no discernible rash or other issues Neuro cranial nerves II through XII grossly intact, power 5 on 5, able to walk and ambulate, headaches are  present. Stiff paravertebral muscles of the cervical musculature  Data Reviewed: Basic Metabolic Panel:  Lab 01/29/12 5784 01/28/12 0418 01/27/12 0416 01/26/12 1445 01/26/12 1442 01/25/12 2310  NA 138 139 141 -- 140 135  K 3.6 4.3 -- -- -- --  CL 104 103 106 -- 104 98  CO2 26 27 25  -- 25 26  GLUCOSE 90 100* 87 -- 101* 105*  BUN 12 16 20  -- 19 20  CREATININE 1.44* 1.75* 2.30* -- 2.57* 2.96*  CALCIUM 8.6 8.7 9.0 -- 9.0 9.2  MG -- -- -- 2.2 -- --  PHOS -- -- -- -- -- --   Liver Function Tests:  Lab 01/29/12 0423 01/28/12 0418 01/27/12 0416 01/26/12 1442 01/25/12 2310  AST 13 10 13 14 18   ALT 19 18 22 24 26   ALKPHOS 75 81 89 96 92  BILITOT 0.4 0.6 0.7 0.6 0.4  PROT 6.1 6.4 6.8 7.3 7.4  ALBUMIN 3.2* 3.4* 3.7 3.9 4.0    Lab 01/26/12 1442 01/25/12 2310  LIPASE 26 37  AMYLASE -- --   No results found for this basename: AMMONIA:5 in the last 168 hours CBC:  Lab 01/29/12 0423 01/28/12 0418 01/27/12 0416 01/26/12 1442 01/25/12 2310  WBC 5.7 6.2 8.6 9.4 9.6  NEUTROABS -- -- -- 7.9* --  HGB 13.1 13.0 14.0 14.6 14.3  HCT 37.6* 37.1* 39.6 40.2 40.2  MCV 87.6 87.7 88.2 87.4 87.8  PLT 167 155 162 167 177   Cardiac Enzymes: No  results found for this basename: CKTOTAL:5,CKMB:5,CKMBINDEX:5,TROPONINI:5 in the last 168 hours BNP: No components found with this basename: POCBNP:5 CBG: No results found for this basename: GLUCAP:5 in the last 168 hours  Recent Results (from the past 240 hour(s))  URINE CULTURE     Status: Normal   Collection Time   01/26/12  7:35 PM      Component Value Range Status Comment   Specimen Description URINE, CLEAN CATCH   Final    Special Requests NONE   Final    Culture  Setup Time [id number]   Final    Colony Count NO GROWTH   Final    Culture NO GROWTH   Final    Report Status 01/28/2012 FINAL   Final   CLOSTRIDIUM DIFFICILE BY PCR     Status: Normal   Collection Time   01/27/12  4:49 AM      Component Value Range Status Comment   C difficile by  pcr NEGATIVE  NEGATIVE Final      Studies:              All Imaging reviewed and is as per above notation   Scheduled Meds:   . acetaminophen  650 mg Oral QID  . heparin  5,000 Units Subcutaneous Q8H  . nicotine  21 mg Transdermal Daily  . pantoprazole  40 mg Oral Q1200  . sodium chloride  3 mL Intravenous Q12H  . traMADol  50 mg Oral Q6H  . DISCONTD: pantoprazole (PROTONIX) IV  40 mg Intravenous Q24H   Continuous Infusions:   . sodium chloride 150 mL/hr at 01/29/12 1052     Assessment/Plan:  1. Acute Gastroenteritis: Presenting with a 4-5 day history of abdominal pain, vomiting and diarrhea, against a background of recent antibiotic treatment for UTI/Acute right pyelonephritis. Lipase is normal at 26, effectively ruling out acute pancreatitis. Managing with bowel rest-graduated to full diet 6/12, discontinue iv fluids 6/12, antiemetics and discontinue PPI 6/, Patient already feels better. C. Difficile PCR is negative. Likely, patient has a self-limiting viral gastroenteritis. Diarrhea has ceased on prn Loperamide.  2. acute kidney injury from Dehydration: Secondary to nausea and vomiting/diarrhea, as well as poor oral intake, occasioned by recent acute illness. Improving steadily with iv fluids-change to by mouth fluids.  Kidney function near baseline 3. Chronic back pain He has however, been taking Motrin 800 m about twice-three times daily, prn, chronically, for back pain, so this may be contributory. NSAIDS will be discontinued. Satisfactory response to iv fluids, is anticipated.  We will need to limit his tramadol eventually 4. Tobacco abuse: Smokes about a pack of cigarettes per day, and has done so for several years. Counseled. Managing with Nicoderm CQ patch.  5. Acute pyelonephritis: Recently diagnosed and treated with course of Ciprofloxacin, completed on 01/22/12, after 5 days Rx. Urinalysis is negative, although patient has minimal perinephric stranding of right kidney. Likely,  this is partially treated. As he has no dysuria, wcc is normal, and he is apyrexial, will not recommence antibiotic therapy at this time. Urine culture is negative so far.  6. Asthma: No clinical evidence of asthma exacerbation, at this time. Patient utilizes Symbicort for this, occasionally.  7. Headache: This has been quite intractable, since admission, and may be part of a viral syndrome.  changed to high-dose Tylenol-CT scan head done on 6/11 was negative for any intracranial pathology and this may be a component of his chronic back pain as well   Code Status: Full  Family Communication: Discussed with Wife in room Disposition Plan: Home in 1-2 days   Pleas Koch, MD  [facility] Pager [phone] 01/29/2012, 12:56 PM    LOS: 3 days

## 2012-01-29 NOTE — Progress Notes (Signed)
Patient is alert and oriented, vital signs are stable, pt medicated earlier this morning for headache, headache has greatly improved as the day has progressed, wife at bedside and supportive, pt is tolerating regular diet and increasing fluid intake, will continue to monitor Means, Myrtie Hawk RN 01-29-12 16:42pm

## 2012-01-29 NOTE — Progress Notes (Signed)
Pt followed for progression of care as a benefit of [employer]. [facility] will follow as needed and appropriate. Livia Snellen, RN, CCM, [facility]ia[facility], # [phone].

## 2012-01-30 DIAGNOSIS — F17200 Nicotine dependence, unspecified, uncomplicated: Secondary | ICD-10-CM

## 2012-01-30 DIAGNOSIS — N179 Acute kidney failure, unspecified: Secondary | ICD-10-CM

## 2012-01-30 DIAGNOSIS — E860 Dehydration: Secondary | ICD-10-CM

## 2012-01-30 DIAGNOSIS — A088 Other specified intestinal infections: Secondary | ICD-10-CM

## 2012-01-30 LAB — BASIC METABOLIC PANEL
BUN: 12 mg/dL (ref 6–23)
CO2: 29 mEq/L (ref 19–32)
Calcium: 8.6 mg/dL (ref 8.4–10.5)
GFR calc non Af Amer: 62 mL/min — ABNORMAL LOW (ref >90)
Glucose, Bld: 97 mg/dL (ref 70–99)

## 2012-01-30 MED ORDER — NICOTINE 21 MG/24HR TD PT24: 1.0000 | MEDICATED_PATCH | Freq: Every day | TRANSDERMAL | Status: DC

## 2012-01-30 MED ORDER — OXYCODONE-ACETAMINOPHEN 5-325 MG PO TABS: 1.0000 | ORAL_TABLET | ORAL | Status: AC | PRN

## 2012-01-30 MED ORDER — ACETAMINOPHEN 325 MG PO TABS: 650.0000 mg | ORAL_TABLET | Freq: Four times a day (QID) | ORAL | Status: DC

## 2012-01-30 NOTE — Discharge Summary (Signed)
[facility] Hospital Discharge Summary  Date of Admission: 01/26/2012 12:51 PM Admitter: @ADMITPROV @   Date of Discharge6/13/2013 Attending Physician: Rhetta Mura, MD  Things to Follow-up on: Repeat BMET in 3-5 days please to ensure resolution of AKI-he has been counselled to avoid NSAIDS Pateint requested HIV testing on morning of D/c-please perform and follow as out-patient   Matthew [name] ZOX:[id number] DOB: September 26, 1970 DOA: 01/26/2012  PCP: Sanda Linger, MD   Brief narrative:  41 yr old Male admitted with Vomiting + Flank pain and recent UTI and a history of chronic low back pain, for over 15 years, for which he takes Motrin as needed, and tobacco use, but otherwise, no significant medical history, who presents with above symptoms. According to patient he felt unwell for about 2 weeks, with generalized malise, but no urinary tract symptoms. On 01/17/12, he went to an Urgent Care Center, was diagnosed with urinary tract infection/possible prostatitis, and started on a 15 day course of Ciprofloxacin, which he only took for 5 days (till 01/22/12). On 01/22/12, he developed right upper quadrant abdominal and right flank pain, associated with Nausea, and started vomiting from 01/23/12, about 2-3/day. He had no fever, but has had chills. By 01/24/12, he developed diarrhea with watery stools up to 3 times daily, without passage of blood. Oral intake has been poor. No dysuria, frequency or hematuria. He presented to the ED on 01/25/12, was evaluated, given iv fluids, and discharged on Keflex, Zofran and Percocet at about 5:00 AM on 01/26/12. He did fill the prescriptions, got home and went to bed at about 9:00 AM His wife brought him Zofran and Keflex when he awoke, he took these and promptly threw up. Spouse brought him back to the ED, as symptomshhad not improved    Past medical history:  Childhood asthma  Chronic LBP  Tobacco abuse   Consultants:  None  Procedures:  CT abd with contrast  01/26/12=Minimal right perinephric fat stranding. Correlate with urinalysis  if concerned for ascending infection.  Nonspecific splenomegaly   US abdomen 6/9=IMPRESSION:  Negative abdominal ultrasound.   Ct head 6/11=IMPRESSION:  No acute intracranial findings.   Antibiotics:  none   Hospital Course by problem list: 1. Acute Gastroenteritis: Presenting with a 4-5 day history of abdominal pain, vomiting and diarrhea, against a background of recent antibiotic treatment for UTI/Acute right pyelonephritis. Lipase is normal at 26, effectively ruling out acute pancreatitis. Managing with bowel rest-graduated to full diet 6/12, discontinue iv fluids 6/12, antiemetics and discontinue PPI 6/12, Patient already feels better. C. Difficile PCR negative. Likely, patient has a self-limiting viral gastroenteritis. Diarrhea has ceased on prn Loperamide, which was also d/c.  He was tolerating a full breakfast on day of Discharge 6/13 and was deemed to have maximally benefited from hospital stay  2. acute kidney injury from Dehydration: Secondary to nausea and vomiting/diarrhea, as well as poor oral intake, occasioned by recent acute illness. Improving steadily with iv fluids-change to by mouth fluids 6/12. Kidney function near baseline-his last creatinine was 1.39 down from an admission creatinine of 2.96. His baseline creatinine is 0.9 the patient was encouraged to take copious amounts of fluid and stay away from nonsteroidal anti-inflammatory drugs  3. Chronic back pain He has however, been taking Motrin 800 m about twice-three times daily, prn, chronically, for back pain, so this may be contributory. NSAIDS will be discontinued. Satisfactory response to iv fluids, is anticipated. We will need to limit his tramadol eventually-I prescribed Percocet limited amounts and Tylenol arthritis  for outpatient use. He may benefit from outpatient therapy and multimodal back pain management  4. Tobacco abuse: Smokes about a  pack of cigarettes per day, and has done so for several years. Counseled. Managing with Nicoderm CQ patch.   5. Acute pyelonephritis: Recently diagnosed and treated with course of Ciprofloxacin, completed on 01/22/12, after 5 days Rx. Urinalysis is negative, although patient has minimal perinephric stranding of right kidney. Likely, this is partially treated. As he has no dysuria, wcc is normal, and he is apyrexial, will not recommence antibiotic therapy at this time. Urine culture is negative so far.   6. Asthma: No clinical evidence of asthma exacerbation, at this time. Patient utilizes Symbicort for this, occasionally.   7. Headache: This has been quite intractable, since admission, and may be part of a viral syndrome. changed to high-dose Tylenol-CT scan head done on 6/11 was negative for any intracranial pathology and this may be a component of his chronic back pain as well    LOS: 4 days    Procedures Performed and pertinent labs: Ct Abdomen Pelvis Wo Contrast  01/26/2012  *RADIOLOGY REPORT*  Clinical Data: Nausea, abdominal pain.  CT ABDOMEN AND PELVIS WITHOUT CONTRAST  Technique:  Multidetector CT imaging of the abdomen and pelvis was performed following the standard protocol without intravenous contrast.  Comparison: None.  Findings: Minimal dependent atelectasis.  Organ abnormality/lesion detection is limited in the absence of intravenous contrast. Within this limitation, unremarkable liver, biliary system, pancreas, adrenal glands.  Splenomegaly, measuring 14.8 cm cranial caudal.  Minimal right perinephric fat stranding.  Symmetric renal size.  No hydronephrosis or hydroureter.  No bowel obstruction.  No CT evidence for colitis.  Normal appendix.  No free intraperitoneal air or fluid.  No lymphadenopathy.  Normal caliber vasculature.  Thin-walled bladder.  Multilevel degenerative changes of the imaged spine. No acute or aggressive appearing osseous lesion.  IMPRESSION: Minimal right  perinephric fat stranding.  Correlate with urinalysis if concerned for ascending infection.  Nonspecific splenomegaly.  Original Report Authenticated By: Waneta Martins, M.D.   Ct Head Wo Contrast  01/28/2012  *RADIOLOGY REPORT*  Clinical Data: Severe headaches for 1 day.  Suspected gastrointestinal illness.  CT HEAD WITHOUT CONTRAST  Technique:  Contiguous axial images were obtained from the base of the skull through the vertex without contrast.  Comparison: None.  Findings: There is no evidence for acute infarction, intracranial hemorrhage, mass lesion, hydrocephalus, or extra-axial fluid.  No atrophy is present.  Possible small lacune left frontal subcortical white matter, chronic in appearance. Unremarkable skull  base and calvarium.  Clear sinuses and mastoids.  IMPRESSION: No acute intracranial findings.  Original Report Authenticated By: Elsie Stain, M.D.   US Abdomen Complete  01/26/2012  *RADIOLOGY REPORT*  Clinical Data:  Abdominal pain, increased creatinine  COMPLETE ABDOMINAL ULTRASOUND  Comparison:  CT scan same day  Findings:  Gallbladder:  No gallstones, gallbladder wall thickening, or pericholecystic fluid. No sonographic Murphy's sign  Common bile duct:  Measures 4 mm in diameter within normal limits.  Liver:  No focal lesion identified.  Within normal limits in parenchymal echogenicity.  IVC:  Appears normal.  Pancreas:  No focal abnormality seen.  Spleen:  Measures 0.2 cm in length.  Normal echogenicity.  Right Kidney:  Measures 14 cm in length.  No mass, hydronephrosis or diagnostic renal calculus  Left Kidney:  Measures 14.6 cm in length.  No mass, hydronephrosis or diagnostic renal calculus  Abdominal aorta:  No aneurysm identified. Measures  up to 2.2 cm in diameter.  IMPRESSION: Negative abdominal ultrasound.  Original Report Authenticated By: Natasha Mead, M.D.    Discharge Vitals & PE:  BP 129/79  Pulse 62  Temp 97.5 F (36.4 C) (Oral)  Resp 16  Ht 6\' 5"  (1.956 m)  Wt  107.502 kg (237 lb)  BMI 28.10 kg/m2  SpO2 95% General: Oriented well-groomed Caucasian male in no apparent distress  Cardiovascular: S1-S2 no murmur rub or gallop  Respiratory: Clinically clear no added sound  Abdomen: Soft nontender nondistended  Skin no discernible rash or other issues  Neuro cranial nerves II through XII grossly intact, power 5 on 5, able to walk and ambulate, headaches are present. Stiff paravertebral muscles of the cervical musculature   Discharge Labs:  Results for orders placed during the hospital encounter of 01/26/12 (from the past 24 hour(s))  BASIC METABOLIC PANEL     Status: Abnormal   Collection Time   01/30/12  3:45 AM      Component Value Range   Sodium 138  135 - 145 mEq/L   Potassium 3.2 (*) 3.5 - 5.1 mEq/L   Chloride 100  96 - 112 mEq/L   CO2 29  19 - 32 mEq/L   Glucose, Bld 97  70 - 99 mg/dL   BUN 12  6 - 23 mg/dL   Creatinine, Ser 4.54 (*) 0.50 - 1.35 mg/dL   Calcium 8.6  8.4 - 09.8 mg/dL   GFR calc non Af Amer 62 (*) >90 mL/min   GFR calc Af Amer 71 (*) >90 mL/min    Disposition and follow-up:   Mr.Dyland Guymon was discharged from in good condition.    Follow-up Appointments:  Follow-up Information    Follow up with Sanda Linger, MD in 3 days.   Contact information:   [address] [facility] [address] [place], [address] [place] [address] [phone]          Discharge Medications: Medication List  As of 01/30/2012  8:28 AM   STOP taking these medications         cephALEXin 500 MG capsule         TAKE these medications         acetaminophen 325 MG tablet   Commonly known as: TYLENOL   Take 2 tablets (650 mg total) by mouth 4 (four) times daily.      nicotine 21 mg/24hr patch   Commonly known as: NICODERM CQ - dosed in mg/24 hours   Place 1 patch onto the skin daily.      ondansetron 8 MG disintegrating tablet   Commonly known as: ZOFRAN-ODT   Take 1 tablet (8 mg total) by mouth every 8 (eight) hours as  needed for nausea.      oxyCODONE-acetaminophen 5-325 MG per tablet   Commonly known as: PERCOCET   Take 1 tablet by mouth every 4 (four) hours as needed for pain.           Medications Discontinued During This Encounter  Medication Reason  . 0.9 %  sodium chloride infusion   . ondansetron (ZOFRAN) injection 4 mg   . HYDROmorphone (DILAUDID) injection 1 mg   . HYDROmorphone (DILAUDID) injection 0.5 mg   . acetaminophen (TYLENOL) tablet 650 mg   . acetaminophen (TYLENOL) suppository 650 mg   . pantoprazole (PROTONIX) injection 40 mg   . pantoprazole (PROTONIX) EC tablet 40 mg   . 0.9 %  sodium chloride infusion   . HYDROmorphone (  DILAUDID) injection 1 mg   . loperamide (IMODIUM) capsule 2 mg   . HYDROmorphone (DILAUDID) injection 0.5 mg   . pneumococcal 23 valent vaccine (PNU-IMMUNE) injection 0.5 mL Stop Taking at Discharge  . oxyCODONE-acetaminophen (PERCOCET) 5-325 MG per tablet   . cephALEXin (KEFLEX) 500 MG capsule Stop Taking at Discharge      > 40 minutes time spent preparing d/c summary, including direct face-face patient Time, contact with consultants, family and care coordination   Signed: Rhetta Mura 01/30/2012, 8:28 AM

## 2012-01-30 NOTE — Progress Notes (Signed)
Discharge instructions reviewed with patient, pt is to follow up with MD within 3 days, pt is tolearting diet without complaints, and has no pain currently, prescriptions given to patient and questions and concerns answered Means, Myrtie Hawk RN 01-30-12 8:51am

## 2012-01-31 ENCOUNTER — Encounter: Payer: Self-pay | Admitting: Internal Medicine

## 2012-01-31 ENCOUNTER — Other Ambulatory Visit (INDEPENDENT_AMBULATORY_CARE_PROVIDER_SITE_OTHER): Payer: 59

## 2012-01-31 ENCOUNTER — Ambulatory Visit (INDEPENDENT_AMBULATORY_CARE_PROVIDER_SITE_OTHER): Payer: 59 | Admitting: Internal Medicine

## 2012-01-31 VITALS — BP 114/64 | HR 74 | Temp 98.4°F | Resp 16 | Wt 237.0 lb

## 2012-01-31 DIAGNOSIS — N410 Acute prostatitis: Secondary | ICD-10-CM

## 2012-01-31 DIAGNOSIS — E876 Hypokalemia: Secondary | ICD-10-CM

## 2012-01-31 DIAGNOSIS — D649 Anemia, unspecified: Secondary | ICD-10-CM

## 2012-01-31 DIAGNOSIS — N179 Acute kidney failure, unspecified: Secondary | ICD-10-CM

## 2012-01-31 LAB — URINALYSIS, ROUTINE W REFLEX MICROSCOPIC
Ketones, ur: NEGATIVE
Specific Gravity, Urine: <=1.005 (ref 1.000–1.030)
Urine Glucose: NEGATIVE
Urobilinogen, UA: 0.2 (ref 0.0–1.0)
pH: 7 (ref 5.0–8.0)

## 2012-01-31 LAB — CBC WITH DIFFERENTIAL/PLATELET
Basophils Absolute: 0 10*3/uL (ref 0.0–0.1)
HCT: 44.2 % (ref 39.0–52.0)
Hemoglobin: 15.1 g/dL (ref 13.0–17.0)
Lymphs Abs: 1.7 10*3/uL (ref 0.7–4.0)
MCV: 90.7 fl (ref 78.0–100.0)
Monocytes Relative: 6.8 % (ref 3.0–12.0)
Neutro Abs: 6.2 10*3/uL (ref 1.4–7.7)
RDW: 12.2 % (ref 11.5–14.6)

## 2012-01-31 LAB — COMPREHENSIVE METABOLIC PANEL
ALT: 126 U/L — ABNORMAL HIGH (ref 0–53)
AST: 80 U/L — ABNORMAL HIGH (ref 0–37)
Alkaline Phosphatase: 83 U/L (ref 39–117)
CO2: 31 mEq/L (ref 19–32)
Creatinine, Ser: 1.2 mg/dL (ref 0.4–1.5)
GFR: 69.55 mL/min (ref >60.00)
Total Bilirubin: 0.9 mg/dL (ref 0.3–1.2)

## 2012-01-31 LAB — CORTISOL: Cortisol, Plasma: 11 ug/dL

## 2012-01-31 LAB — MAGNESIUM: Magnesium: 1.6 mg/dL (ref 1.5–2.5)

## 2012-01-31 MED ORDER — POTASSIUM CHLORIDE ER 8 MEQ PO TBCR: 8.0000 meq | EXTENDED_RELEASE_TABLET | Freq: Two times a day (BID) | ORAL | Status: DC

## 2012-01-31 NOTE — Patient Instructions (Signed)

## 2012-01-31 NOTE — Progress Notes (Signed)
Subjective:    Patient ID: Matthew [name], male    DOB: [birth year], 41 y.o.   MRN: [id number]  HPI He returns for f/up after a recent hospital stay. I have reviewed the hospital records. His illness goes back about 2 weeks when he was seen at [facility] for vague symptoms and was thought to have prostatitis and was given cipro, he worsened on cipro and motrin and developed GI symptoms and was eventually admitted for dehydration and renal failure thought to be caused by AGE, he had significant testing done and nothing specific was found. He was given IV fluids and improved and was discharged on keflex. He returns today and says that he feels "worse" with fatigue, dizziness and diffuse weakness.   Review of Systems  Constitutional: Positive for activity change, appetite change (some loss of appetite), fatigue and unexpected weight change (10 pound weight loss). Negative for fever, chills and diaphoresis.  HENT: Negative.   Eyes: Negative.   Respiratory: Negative for cough, choking, shortness of breath, wheezing and stridor.   Cardiovascular: Negative.   Gastrointestinal: Negative for nausea, vomiting, abdominal pain, diarrhea, constipation, blood in stool, abdominal distention, anal bleeding and rectal pain.  Genitourinary: Negative for dysuria, urgency, frequency, hematuria, flank pain, decreased urine volume, discharge, penile swelling, scrotal swelling, enuresis, difficulty urinating, genital sores, penile pain and testicular pain.  Musculoskeletal: Negative for myalgias, back pain, joint swelling, arthralgias and gait problem.  Skin: Negative for color change, pallor, rash and wound.  Neurological: Positive for dizziness and weakness. Negative for tremors, seizures, syncope, facial asymmetry, speech difficulty, light-headedness, numbness and headaches.  Hematological: Negative for adenopathy. Does not bruise/bleed easily.  Psychiatric/Behavioral: Negative for suicidal ideas, hallucinations,  behavioral problems, confusion, disturbed wake/sleep cycle, self-injury, dysphoric mood, decreased concentration and agitation. The patient is not nervous/anxious and is not hyperactive.        Objective:   Physical Exam  Vitals reviewed. Constitutional: He is oriented to person, place, and time. He appears well-developed and well-nourished. No distress.  HENT:  Head: Normocephalic and atraumatic.  Mouth/Throat: Oropharynx is clear and moist. No oropharyngeal exudate.  Eyes: Conjunctivae are normal. Right eye exhibits no discharge. Left eye exhibits no discharge. No scleral icterus.  Neck: Normal range of motion. Neck supple. No JVD present. No tracheal deviation present. No thyromegaly present.  Cardiovascular: Normal rate, regular rhythm, normal heart sounds and intact distal pulses.  Exam reveals no gallop and no friction rub.   No murmur heard. Pulmonary/Chest: Effort normal and breath sounds normal. No stridor. No respiratory distress. He has no wheezes. He has no rales. He exhibits no tenderness.  Abdominal: Soft. Bowel sounds are normal. He exhibits no distension and no mass. There is no tenderness. There is no rebound and no guarding. Hernia confirmed negative in the right inguinal area and confirmed negative in the left inguinal area.  Genitourinary: Testes normal and penis normal. Rectal exam shows no external hemorrhoid, no internal hemorrhoid, no fissure, no mass, no tenderness and anal tone normal. Guaiac negative stool. Prostate is enlarged (left lobe is slightly boggy). Prostate is not tender. Right testis shows no mass, no swelling and no tenderness. Right testis is descended. Left testis shows no mass, no swelling and no tenderness. Left testis is descended. Uncircumcised. No phimosis, paraphimosis, hypospadias, penile erythema or penile tenderness. No discharge found.  Musculoskeletal: Normal range of motion. He exhibits no edema and no tenderness.  Lymphadenopathy:    He has no  cervical adenopathy.  Right: No inguinal adenopathy present.       Left: No inguinal adenopathy present.  Neurological: He is oriented to person, place, and time.  Skin: Skin is warm and dry. No rash noted. He is not diaphoretic. No erythema. No pallor.  Psychiatric: He has a normal mood and affect. His behavior is normal. Judgment and thought content normal.      Lab Results  Component Value Date   WBC 5.7 01/29/2012   HGB 13.1 01/29/2012   HCT 37.6* 01/29/2012   PLT 167 01/29/2012   GLUCOSE 97 01/30/2012   CHOL 143 01/21/2011   TRIG 174.0* 01/21/2011   HDL 31.20* 01/21/2011   LDLCALC 77 01/21/2011   ALT 19 01/29/2012   AST 13 01/29/2012   NA 138 01/30/2012   K 3.2* 01/30/2012   CL 100 01/30/2012   CREATININE 1.39* 01/30/2012   BUN 12 01/30/2012   CO2 29 01/30/2012   TSH 1.20 01/21/2011  US Abdomen Complete  01/26/2012  *RADIOLOGY REPORT*  Clinical Data:  Abdominal pain, increased creatinine  COMPLETE ABDOMINAL ULTRASOUND  Comparison:  CT scan same day  Findings:  Gallbladder:  No gallstones, gallbladder wall thickening, or pericholecystic fluid. No sonographic Murphy's sign  Common bile duct:  Measures 4 mm in diameter within normal limits.  Liver:  No focal lesion identified.  Within normal limits in parenchymal echogenicity.  IVC:  Appears normal.  Pancreas:  No focal abnormality seen.  Spleen:  Measures 0.2 cm in length.  Normal echogenicity.  Right Kidney:  Measures 14 cm in length.  No mass, hydronephrosis or diagnostic renal calculus  Left Kidney:  Measures 14.6 cm in length.  No mass, hydronephrosis or diagnostic renal calculus  Abdominal aorta:  No aneurysm identified. Measures up to 2.2 cm in diameter.  IMPRESSION: Negative abdominal ultrasound.  Original Report Authenticated By: Natasha Mead, M.D.  Ct Abdomen Pelvis Wo Contrast  01/26/2012  *RADIOLOGY REPORT*  Clinical Data: Nausea, abdominal pain.  CT ABDOMEN AND PELVIS WITHOUT CONTRAST  Technique:  Multidetector CT imaging of the abdomen and  pelvis was performed following the standard protocol without intravenous contrast.  Comparison: None.  Findings: Minimal dependent atelectasis.  Organ abnormality/lesion detection is limited in the absence of intravenous contrast. Within this limitation, unremarkable liver, biliary system, pancreas, adrenal glands.  Splenomegaly, measuring 14.8 cm cranial caudal.  Minimal right perinephric fat stranding.  Symmetric renal size.  No hydronephrosis or hydroureter.  No bowel obstruction.  No CT evidence for colitis.  Normal appendix.  No free intraperitoneal air or fluid.  No lymphadenopathy.  Normal caliber vasculature.  Thin-walled bladder.  Multilevel degenerative changes of the imaged spine. No acute or aggressive appearing osseous lesion.  IMPRESSION: Minimal right perinephric fat stranding.  Correlate with urinalysis if concerned for ascending infection.  Nonspecific splenomegaly.  Original Report Authenticated By: Waneta Martins, M.D.   Ct Head Wo Contrast  01/28/2012  *RADIOLOGY REPORT*  Clinical Data: Severe headaches for 1 day.  Suspected gastrointestinal illness.  CT HEAD WITHOUT CONTRAST  Technique:  Contiguous axial images were obtained from the base of the skull through the vertex without contrast.  Comparison: None.  Findings: There is no evidence for acute infarction, intracranial hemorrhage, mass lesion, hydrocephalus, or extra-axial fluid.  No atrophy is present.  Possible small lacune left frontal subcortical white matter, chronic in appearance. Unremarkable skull  base and calvarium.  Clear sinuses and mastoids.  IMPRESSION: No acute intracranial findings.  Original Report Authenticated By: Elsie Stain, M.D.  US Abdomen Complete  01/26/2012  *RADIOLOGY REPORT*  Clinical Data:  Abdominal pain, increased creatinine  COMPLETE ABDOMINAL ULTRASOUND  Comparison:  CT scan same day  Findings:  Gallbladder:  No gallstones, gallbladder wall thickening, or pericholecystic fluid. No sonographic  Murphy's sign  Common bile duct:  Measures 4 mm in diameter within normal limits.  Liver:  No focal lesion identified.  Within normal limits in parenchymal echogenicity.  IVC:  Appears normal.  Pancreas:  No focal abnormality seen.  Spleen:  Measures 0.2 cm in length.  Normal echogenicity.  Right Kidney:  Measures 14 cm in length.  No mass, hydronephrosis or diagnostic renal calculus  Left Kidney:  Measures 14.6 cm in length.  No mass, hydronephrosis or diagnostic renal calculus  Abdominal aorta:  No aneurysm identified. Measures up to 2.2 cm in diameter.  IMPRESSION: Negative abdominal ultrasound.  Original Report Authenticated By: Natasha Mead, M.D.     Assessment & Plan:

## 2012-02-01 ENCOUNTER — Encounter: Payer: Self-pay | Admitting: Internal Medicine

## 2012-02-02 ENCOUNTER — Encounter: Payer: Self-pay | Admitting: Internal Medicine

## 2012-02-02 DIAGNOSIS — N410 Acute prostatitis: Secondary | ICD-10-CM | POA: Insufficient documentation

## 2012-02-02 NOTE — Assessment & Plan Note (Signed)
Labs today show a very slight increase in his LFT's c/w a recent viral GI illness, I don't think this is "viral hepatitis," he was informed over the phone of the lab results and will return for f/up as directed

## 2012-02-02 NOTE — Assessment & Plan Note (Signed)
He has no s/s of blood loss, I will recheck his CBC today 

## 2012-02-02 NOTE — Assessment & Plan Note (Signed)
I will recheck his K+ level today and he will start K+ replacement therapy

## 2012-02-02 NOTE — Assessment & Plan Note (Addendum)
It sounds like he has improved some, I will recheck his labs today, at his request he will have an HIV test done today

## 2012-02-02 NOTE — Assessment & Plan Note (Signed)
He will stay on keflex for now

## 2012-02-03 ENCOUNTER — Ambulatory Visit: Payer: 59 | Admitting: Internal Medicine

## 2012-02-18 ENCOUNTER — Ambulatory Visit (INDEPENDENT_AMBULATORY_CARE_PROVIDER_SITE_OTHER): Payer: 59 | Admitting: Internal Medicine

## 2012-02-18 ENCOUNTER — Encounter: Payer: Self-pay | Admitting: Internal Medicine

## 2012-02-18 ENCOUNTER — Other Ambulatory Visit (INDEPENDENT_AMBULATORY_CARE_PROVIDER_SITE_OTHER): Payer: 59

## 2012-02-18 ENCOUNTER — Encounter: Payer: Self-pay | Admitting: *Deleted

## 2012-02-18 VITALS — BP 90/72 | HR 84 | Temp 97.3°F | Ht 77.0 in | Wt 242.1 lb

## 2012-02-18 DIAGNOSIS — R5383 Other fatigue: Secondary | ICD-10-CM

## 2012-02-18 DIAGNOSIS — I959 Hypotension, unspecified: Secondary | ICD-10-CM

## 2012-02-18 DIAGNOSIS — R748 Abnormal levels of other serum enzymes: Secondary | ICD-10-CM

## 2012-02-18 DIAGNOSIS — R5381 Other malaise: Secondary | ICD-10-CM

## 2012-02-18 DIAGNOSIS — M546 Pain in thoracic spine: Secondary | ICD-10-CM

## 2012-02-18 LAB — CBC WITH DIFFERENTIAL/PLATELET
Eosinophils Absolute: 0.2 10*3/uL (ref 0.0–0.7)
Lymphocytes Relative: 25.7 % (ref 12.0–46.0)
MCHC: 34.1 g/dL (ref 30.0–36.0)
MCV: 91.2 fl (ref 78.0–100.0)
Monocytes Absolute: 0.5 10*3/uL (ref 0.1–1.0)
Neutrophils Relative %: 66.6 % (ref 43.0–77.0)
Platelets: 208 10*3/uL (ref 150.0–400.0)
RBC: 4.92 Mil/uL (ref 4.22–5.81)
WBC: 9.9 10*3/uL (ref 4.5–10.5)

## 2012-02-18 LAB — BASIC METABOLIC PANEL
BUN: 10 mg/dL (ref 6–23)
GFR: 96.3 mL/min (ref >60.00)
Potassium: 4.1 mEq/L (ref 3.5–5.1)
Sodium: 140 mEq/L (ref 135–145)

## 2012-02-18 LAB — HEPATIC FUNCTION PANEL
ALT: 58 U/L — ABNORMAL HIGH (ref 0–53)
Albumin: 4.7 g/dL (ref 3.5–5.2)
Bilirubin, Direct: 0.1 mg/dL (ref 0.0–0.3)
Total Protein: 8.2 g/dL (ref 6.0–8.3)

## 2012-02-18 NOTE — Patient Instructions (Signed)
It was good to see you today. We have reviewed your prior records including labs and tests today Test(s) ordered today. Your results will be called to you after review. If any changes need to be made, you will be notified at that time. Continue tramadol as needed for back discomfort Remain out of work until Monday 7/8 (or as directed) Hydrate, rest and, if your symptoms continue to worsen (pain, fever, etc), or if you are unable take anything by mouth (pills, fluids, etc), you should go to the emergency room for further evaluation and treatment.

## 2012-02-18 NOTE — Progress Notes (Signed)
Subjective:    Patient ID: Matthew [name], male    DOB: 01-02-1971, 41 y.o.   MRN: [id number]  HPI Here for fatigue Increasing back pain (mid back) in past 48, similar to hospitalization 3 weeks ago (ARF, PN) ?dehydration after outdoor sun/heat exposure 72h ago  Past Medical History  Diagnosis Date  . Low back pain   . Asthma     Review of Systems  Constitutional: Positive for fatigue. Negative for fever (none since 01/24/12), chills and unexpected weight change.  HENT: Negative for congestion, sore throat, facial swelling, rhinorrhea, trouble swallowing, neck pain, neck stiffness and sinus pressure.   Eyes: Negative for photophobia and visual disturbance.  Respiratory: Negative for cough, shortness of breath and wheezing.   Cardiovascular: Negative for chest pain, palpitations and leg swelling.  Gastrointestinal: Positive for nausea and abdominal pain (mild epigastric). Negative for vomiting, diarrhea, constipation, blood in stool and abdominal distention.  Genitourinary: Negative for dysuria, urgency, frequency, hematuria, flank pain, decreased urine volume, discharge, penile swelling, scrotal swelling, difficulty urinating, genital sores, penile pain and testicular pain.  Musculoskeletal: Positive for back pain. Negative for myalgias, joint swelling and gait problem.  Skin: Negative for color change, rash and wound.  Neurological: Positive for weakness and light-headedness. Negative for seizures, facial asymmetry, speech difficulty, numbness and headaches.       "fuzzy head" and slow thoughts x 24h  Hematological: Negative for adenopathy. Does not bruise/bleed easily.  Psychiatric/Behavioral: Positive for confusion and decreased concentration. Negative for dysphoric mood. The patient is not nervous/anxious.        Objective:   Physical Exam BP 90/72  Pulse 84  Temp 97.3 F (36.3 C) (Oral)  Ht 6\' 5"  (1.956 m)  Wt 242 lb 1.9 oz (109.825 kg)  BMI 28.71 kg/m2  SpO2 97% Wt  Readings from Last 3 Encounters:  02/18/12 242 lb 1.9 oz (109.825 kg)  01/31/12 237 lb (107.502 kg)  01/26/12 237 lb (107.502 kg)   Constitutional:  He appears dazed, fatigued and mildly ill - but well-developed and well-nourished. No acute distress.  HENT: NCAT, non tender sinus, OP clear Eyes: no conjunctivitis or icterus - PERRL, EOMI - wears corrective lenses Neck: Normal range of motion. Neck supple. No JVD present. No thyromegaly present.  Cardiovascular: Normal rate, regular rhythm and normal heart sounds.  No murmur heard. no BLE edema Pulmonary/Chest: Effort normal and breath sounds normal. No respiratory distress. no wheezes.  Abdominal: Soft. Bowel sounds are normal. Patient exhibits very mild distension. There is no tenderness. no appreciable HSM Musculoskeletal: Back: full range of motion of thoracic and lumbar spine. Non tender to palpation. Negative straight leg raise. DTR's are symmetrically intact. Sensation intact in all dermatomes of the lower extremities. Full strength to manual muscle testing. patient ambulates with slightly antalgic gait. Marland Kitchen  Neurological: he is alert and oriented to person, place, and time. No cranial nerve deficit. Coordination, speech and balance normal.  Skin: Skin is warm and dry.  No erythema, rash or ulceration.  Psychiatric: he has a normal mood and affect. behavior is normal. Judgment and thought content normal.   Lab Results  Component Value Date   WBC 8.6 01/31/2012   HGB 15.1 01/31/2012   HCT 44.2 01/31/2012   PLT 205.0 01/31/2012   GLUCOSE 93 01/31/2012   CHOL 143 01/21/2011   TRIG 174.0* 01/21/2011   HDL 31.20* 01/21/2011   LDLCALC 77 01/21/2011   ALT 126* 01/31/2012   AST 80* 01/31/2012   NA 140 01/31/2012  K 4.0 01/31/2012   CL 102 01/31/2012   CREATININE 1.2 01/31/2012   BUN 17 01/31/2012   CO2 31 01/31/2012   TSH 2.12 01/31/2012        Assessment & Plan:  Thoracic back pain - different than chronic mild low back pain symptoms, similar to  recent symptoms with hospitalization 01/2012 for ARF and PN Fatigue, non specific weakness Increase LFTs with nausea, no vomiting Hypotension - ?dehydration, ?sepsis  Recheck labs today: CBC, Bmet, LFTs, ESR and Bld cx x 2 Consider need for other imaging of back or refer to medical specialists (GI, renal) depending on results Encouraged hydration - remain off NSAIDs - none since 6/9 hospitalization

## 2012-02-18 NOTE — Addendum Note (Signed)
Addended by: Anselm Jungling on: 02/18/2012 12:44 PM   Modules accepted: Orders

## 2012-02-19 ENCOUNTER — Telehealth: Payer: Self-pay | Admitting: Internal Medicine

## 2012-02-19 ENCOUNTER — Ambulatory Visit (INDEPENDENT_AMBULATORY_CARE_PROVIDER_SITE_OTHER)
Admission: RE | Admit: 2012-02-19 | Discharge: 2012-02-19 | Disposition: A | Discharge status: Home / Self Care | Payer: 59 | Source: Ambulatory Visit | Attending: Internal Medicine | Admitting: Internal Medicine

## 2012-02-19 DIAGNOSIS — M546 Pain in thoracic spine: Secondary | ICD-10-CM

## 2012-02-19 DIAGNOSIS — I959 Hypotension, unspecified: Secondary | ICD-10-CM

## 2012-02-19 MED ORDER — CYCLOBENZAPRINE HCL 10 MG PO TABS: 10.0000 mg | ORAL_TABLET | Freq: Three times a day (TID) | ORAL | Status: AC | PRN

## 2012-02-19 MED ORDER — IOHEXOL 300 MG/ML  SOLN
80.0000 mL | Freq: Once | INTRAMUSCULAR | Status: AC | PRN
  Administered 2012-02-19: 80 mL via INTRAVENOUS

## 2012-02-19 NOTE — Addendum Note (Signed)
Addended by: Rene Paci A on: 02/19/2012 06:00 PM   Modules accepted: Orders

## 2012-02-19 NOTE — Telephone Encounter (Signed)
Noted - called to cell, no answer - LMOM re: normal labs and to call back - also no answer at home#  Given continued back pain, will order CT chest with contrast  [phone] (home) [phone] (work)  Called cell again 12:34 - pt answered - reviewed above - will proceed with CT once scheduled, plan muscle relaxer prn for "spasm" mid-thoracic pain if normal CT

## 2012-02-19 NOTE — Telephone Encounter (Signed)
Pt, Matthew [name], is calling and states that he was seen in the office yesterday, 02/18/12 and had lab work completed and he is wanting to know those results.  He stated that MD was to give him a call back and he hasn't heard anything.  Pt states that he is having mid back pain and h/a and not feeling any better than he did when he was seen in the office for these sx yesterday. Refused triaged; would like this message sent to MD and have a call back. Pt call back number is [phone]. OFFICE PLEASE REVIEW AND CALL PT BACK.

## 2012-02-24 LAB — CULTURE, BLOOD, SINGLE SET ONLY
Organism ID, Bacteria: NO GROWTH
Organism ID, Bacteria: NO GROWTH

## 2012-03-12 ENCOUNTER — Other Ambulatory Visit (INDEPENDENT_AMBULATORY_CARE_PROVIDER_SITE_OTHER): Payer: 59

## 2012-03-12 ENCOUNTER — Ambulatory Visit (INDEPENDENT_AMBULATORY_CARE_PROVIDER_SITE_OTHER): Payer: 59 | Admitting: Internal Medicine

## 2012-03-12 ENCOUNTER — Encounter: Payer: Self-pay | Admitting: Internal Medicine

## 2012-03-12 VITALS — BP 122/70 | HR 88 | Temp 97.3°F | Resp 16 | Wt 246.0 lb

## 2012-03-12 DIAGNOSIS — M419 Scoliosis, unspecified: Secondary | ICD-10-CM | POA: Insufficient documentation

## 2012-03-12 DIAGNOSIS — M4120 Other idiopathic scoliosis, site unspecified: Secondary | ICD-10-CM

## 2012-03-12 DIAGNOSIS — R7309 Other abnormal glucose: Secondary | ICD-10-CM

## 2012-03-12 DIAGNOSIS — M546 Pain in thoracic spine: Secondary | ICD-10-CM | POA: Insufficient documentation

## 2012-03-12 DIAGNOSIS — N410 Acute prostatitis: Secondary | ICD-10-CM

## 2012-03-12 LAB — COMPREHENSIVE METABOLIC PANEL
AST: 26 U/L (ref 0–37)
Albumin: 4.3 g/dL (ref 3.5–5.2)
Alkaline Phosphatase: 79 U/L (ref 39–117)
BUN: 10 mg/dL (ref 6–23)
Creatinine, Ser: 0.7 mg/dL (ref 0.4–1.5)
Glucose, Bld: 92 mg/dL (ref 70–99)
Total Bilirubin: 1 mg/dL (ref 0.3–1.2)

## 2012-03-12 LAB — HEMOGLOBIN A1C: Hgb A1c MFr Bld: 5.5 % (ref 4.6–6.5)

## 2012-03-12 LAB — PSA: PSA: 1.28 ng/mL (ref 0.10–4.00)

## 2012-03-12 MED ORDER — OXYCODONE-ACETAMINOPHEN 5-325 MG PO TABS: 1.0000 | ORAL_TABLET | ORAL | Status: DC | PRN

## 2012-03-12 NOTE — Assessment & Plan Note (Signed)
He will continue current meds and seeing chiropractor

## 2012-03-12 NOTE — Assessment & Plan Note (Signed)
I will repeat his CMP today and will address if needed

## 2012-03-12 NOTE — Assessment & Plan Note (Signed)
I will check his a1c today to see if has developed DM II

## 2012-03-12 NOTE — Progress Notes (Signed)
Subjective:    Patient ID: Matthew [name], male    DOB: Dec 20, 1970, 41 y.o.   MRN: [id number]  Back Pain This is a recurrent problem. The current episode started more than 1 month ago. The problem occurs intermittently. The pain is present in the thoracic spine. The quality of the pain is described as aching. The pain does not radiate. The pain is at a severity of 4/10. The pain is mild. The pain is worse during the night. The symptoms are aggravated by twisting. Stiffness is present at night. Pertinent negatives include no abdominal pain, bladder incontinence, bowel incontinence, chest pain, dysuria, fever, headaches, leg pain, numbness, paresis, paresthesias, perianal numbness, tingling, weakness or weight loss. He has tried NSAIDs, analgesics and chiropractic manipulation for the symptoms. The treatment provided significant relief.      Review of Systems  Constitutional: Negative for fever, chills, weight loss, diaphoresis, activity change, appetite change, fatigue and unexpected weight change.  HENT: Negative.   Eyes: Negative.   Respiratory: Negative for cough, chest tightness, shortness of breath, wheezing and stridor.   Cardiovascular: Negative for chest pain, palpitations and leg swelling.  Gastrointestinal: Negative for nausea, vomiting, abdominal pain, diarrhea, constipation, blood in stool, abdominal distention and bowel incontinence.  Genitourinary: Negative for bladder incontinence, dysuria, urgency, frequency, hematuria, flank pain, decreased urine volume, enuresis, difficulty urinating and testicular pain.  Musculoskeletal: Positive for back pain. Negative for myalgias, joint swelling, arthralgias and gait problem.  Skin: Negative for color change, pallor, rash and wound.  Neurological: Negative.  Negative for tingling, weakness, numbness, headaches and paresthesias.  Hematological: Negative for adenopathy. Does not bruise/bleed easily.  Psychiatric/Behavioral: Negative.         Objective:   Physical Exam  Vitals reviewed. Constitutional: He is oriented to person, place, and time. He appears well-developed and well-nourished. No distress.  HENT:  Head: Normocephalic and atraumatic.  Mouth/Throat: Oropharynx is clear and moist. No oropharyngeal exudate.  Eyes: Conjunctivae are normal. Right eye exhibits no discharge. Left eye exhibits no discharge. No scleral icterus.  Neck: Normal range of motion. Neck supple. No JVD present. No tracheal deviation present. No thyromegaly present.  Cardiovascular: Normal rate, regular rhythm, normal heart sounds and intact distal pulses.  Exam reveals no gallop and no friction rub.   No murmur heard. Pulmonary/Chest: Effort normal and breath sounds normal. No stridor. No respiratory distress. He has no wheezes. He has no rales. He exhibits no tenderness.  Abdominal: Soft. Bowel sounds are normal. He exhibits no distension and no mass. There is no tenderness. There is no rebound and no guarding.  Musculoskeletal: Normal range of motion. He exhibits no edema and no tenderness.       Thoracic back: Normal. He exhibits normal range of motion, no tenderness, no bony tenderness, no swelling, no edema, no deformity, no laceration, no pain, no spasm and normal pulse.  Lymphadenopathy:    He has no cervical adenopathy.  Neurological: He is alert and oriented to person, place, and time. He has normal strength. He displays no atrophy. No cranial nerve deficit or sensory deficit. He exhibits normal muscle tone. He displays a negative Romberg sign. He displays no seizure activity. Coordination and gait normal. He displays no Babinski's sign on the right side. He displays no Babinski's sign on the left side.  Reflex Scores:      Tricep reflexes are 1+ on the right side and 1+ on the left side.      Bicep reflexes are 1+ on the right  side and 1+ on the left side.      Brachioradialis reflexes are 1+ on the right side and 1+ on the left  side.      Patellar reflexes are 1+ on the right side and 1+ on the left side.      Achilles reflexes are 1+ on the right side and 1+ on the left side. Skin: Skin is warm and dry. No rash noted. He is not diaphoretic. No erythema. No pallor.  Psychiatric: He has a normal mood and affect. His behavior is normal. Judgment and thought content normal.      Lab Results  Component Value Date   WBC 9.9 02/18/2012   HGB 15.3 02/18/2012   HCT 44.9 02/18/2012   PLT 208.0 02/18/2012   GLUCOSE 113* 02/18/2012   CHOL 143 01/21/2011   TRIG 174.0* 01/21/2011   HDL 31.20* 01/21/2011   LDLCALC 77 01/21/2011   ALT 58* 02/18/2012   AST 28 02/18/2012   NA 140 02/18/2012   K 4.1 02/18/2012   CL 104 02/18/2012   CREATININE 0.9 02/18/2012   BUN 10 02/18/2012   CO2 26 02/18/2012   TSH 2.12 01/31/2012  Ct Angio Chest W/cm &/or Wo Cm  02/19/2012  *RADIOLOGY REPORT*  Clinical Data: Chest pain.  Upper back pain.  CT ANGIOGRAPHY CHEST  Technique:  Multidetector CT imaging of the chest using the standard protocol during bolus administration of intravenous contrast. Multiplanar reconstructed images including MIPs were obtained and reviewed to evaluate the vascular anatomy.  Contrast: 80mL OMNIPAQUE IOHEXOL 300 MG/ML  SOLN  Comparison: None.  Findings: There are no pulmonary emboli, infiltrates, or effusions. No hilar or mediastinal adenopathy.  Heart size is normal.  Minimal emphysematous changes at the lung bases posteriorly. Minimal thoracic scoliosis.  No acute osseous abnormalities.  IMPRESSION: Slight emphysematous disease in the lower lobes.  Otherwise, no significant abnormality.  Specifically, no pulmonary emboli.  Original Report Authenticated By: Gwynn Burly, M.D.     Assessment & Plan:

## 2012-03-12 NOTE — Assessment & Plan Note (Signed)
I will check his PSA today at his request

## 2012-03-12 NOTE — Patient Instructions (Signed)
Back Pain, Adult Low back pain is very common. About 1 in 5 people have back pain.The cause of low back pain is rarely dangerous. The pain often gets better over time.About half of people with a sudden onset of back pain feel better in just 2 weeks. About 8 in 10 people feel better by 6 weeks.  CAUSES Some common causes of back pain include:  Strain of the muscles or ligaments supporting the spine.   Wear and tear (degeneration) of the spinal discs.   Arthritis.   Direct injury to the back.  DIAGNOSIS Most of the time, the direct cause of low back pain is not known.However, back pain can be treated effectively even when the exact cause of the pain is unknown.Answering your caregiver's questions about your overall health and symptoms is one of the most accurate ways to make sure the cause of your pain is not dangerous. If your caregiver needs more information, he or she may order lab work or imaging tests (X-rays or MRIs).However, even if imaging tests show changes in your back, this usually does not require surgery. HOME CARE INSTRUCTIONS For many people, back pain returns.Since low back pain is rarely dangerous, it is often a condition that people can learn to manageon their own.   Remain active. It is stressful on the back to sit or stand in one place. Do not sit, drive, or stand in one place for more than 30 minutes at a time. Take short walks on level surfaces as soon as pain allows.Try to increase the length of time you walk each day.   Do not stay in bed.Resting more than 1 or 2 days can delay your recovery.   Do not avoid exercise or work.Your body is made to move.It is not dangerous to be active, even though your back may hurt.Your back will likely heal faster if you return to being active before your pain is gone.   Pay attention to your body when you bend and lift. Many people have less discomfortwhen lifting if they bend their knees, keep the load close to their  bodies,and avoid twisting. Often, the most comfortable positions are those that put less stress on your recovering back.   Find a comfortable position to sleep. Use a firm mattress and lie on your side with your knees slightly bent. If you lie on your back, put a pillow under your knees.   Only take over-the-counter or prescription medicines as directed by your caregiver. Over-the-counter medicines to reduce pain and inflammation are often the most helpful.Your caregiver may prescribe muscle relaxant drugs.These medicines help dull your pain so you can more quickly return to your normal activities and healthy exercise.   Put ice on the injured area.   Put ice in a plastic bag.   Place a towel between your skin and the bag.   Leave the ice on for 15 to 20 minutes, 3 to 4 times a day for the first 2 to 3 days. After that, ice and heat may be alternated to reduce pain and spasms.   Ask your caregiver about trying back exercises and gentle massage. This may be of some benefit.   Avoid feeling anxious or stressed.Stress increases muscle tension and can worsen back pain.It is important to recognize when you are anxious or stressed and learn ways to manage it.Exercise is a great option.  SEEK MEDICAL CARE IF:  You have pain that is not relieved with rest or medicine.   You have   pain that does not improve in 1 week.   You have new symptoms.   You are generally not feeling well.  SEEK IMMEDIATE MEDICAL CARE IF:   You have pain that radiates from your back into your legs.   You develop new bowel or bladder control problems.   You have unusual weakness or numbness in your arms or legs.   You develop nausea or vomiting.   You develop abdominal pain.   You feel faint.  Document Released: 08/05/2005 Document Revised: 07/25/2011 Document Reviewed: 12/24/2010 [employer] Patient Information 2012 [employer]. 

## 2012-05-28 ENCOUNTER — Ambulatory Visit (INDEPENDENT_AMBULATORY_CARE_PROVIDER_SITE_OTHER): Payer: Self-pay | Admitting: Internal Medicine

## 2012-05-28 ENCOUNTER — Encounter: Payer: Self-pay | Admitting: Internal Medicine

## 2012-05-28 ENCOUNTER — Ambulatory Visit (INDEPENDENT_AMBULATORY_CARE_PROVIDER_SITE_OTHER)
Admission: RE | Admit: 2012-05-28 | Discharge: 2012-05-28 | Disposition: A | Discharge status: Home / Self Care | Payer: Self-pay | Source: Ambulatory Visit | Attending: Internal Medicine | Admitting: Internal Medicine

## 2012-05-28 ENCOUNTER — Other Ambulatory Visit (INDEPENDENT_AMBULATORY_CARE_PROVIDER_SITE_OTHER): Payer: Self-pay

## 2012-05-28 VITALS — BP 108/72 | HR 92 | Temp 97.8°F | Resp 16 | Wt 254.5 lb

## 2012-05-28 DIAGNOSIS — M545 Low back pain, unspecified: Secondary | ICD-10-CM

## 2012-05-28 DIAGNOSIS — R410 Disorientation, unspecified: Secondary | ICD-10-CM | POA: Insufficient documentation

## 2012-05-28 DIAGNOSIS — F29 Unspecified psychosis not due to a substance or known physiological condition: Secondary | ICD-10-CM

## 2012-05-28 DIAGNOSIS — R7402 Elevation of levels of lactic acid dehydrogenase (LDH): Secondary | ICD-10-CM

## 2012-05-28 LAB — C-REACTIVE PROTEIN: CRP: 1.6 mg/dL (ref 0.5–20.0)

## 2012-05-28 LAB — SEDIMENTATION RATE: Sed Rate: 9 mm/hr (ref 0–22)

## 2012-05-28 LAB — CBC WITH DIFFERENTIAL/PLATELET
Basophils Absolute: 0 10*3/uL (ref 0.0–0.1)
Eosinophils Absolute: 0.3 10*3/uL (ref 0.0–0.7)
Eosinophils Relative: 2.8 % (ref 0.0–5.0)
Lymphs Abs: 3.1 10*3/uL (ref 0.7–4.0)
MCHC: 33.6 g/dL (ref 30.0–36.0)
MCV: 93.3 fl (ref 78.0–100.0)
Monocytes Absolute: 0.8 10*3/uL (ref 0.1–1.0)
Neutrophils Relative %: 58.2 % (ref 43.0–77.0)
Platelets: 213 10*3/uL (ref 150.0–400.0)
RDW: 12.7 % (ref 11.5–14.6)
WBC: 10 10*3/uL (ref 4.5–10.5)

## 2012-05-28 LAB — COMPREHENSIVE METABOLIC PANEL
ALT: 52 U/L (ref 0–53)
Albumin: 4.2 g/dL (ref 3.5–5.2)
Alkaline Phosphatase: 74 U/L (ref 39–117)
Glucose, Bld: 97 mg/dL (ref 70–99)
Potassium: 3.8 mEq/L (ref 3.5–5.1)
Sodium: 137 mEq/L (ref 135–145)
Total Protein: 7.5 g/dL (ref 6.0–8.3)

## 2012-05-28 NOTE — Patient Instructions (Signed)
Back Pain, Adult Low back pain is very common. About 1 in 5 people have back pain.The cause of low back pain is rarely dangerous. The pain often gets better over time.About half of people with a sudden onset of back pain feel better in just 2 weeks. About 8 in 10 people feel better by 6 weeks.  CAUSES Some common causes of back pain include:  Strain of the muscles or ligaments supporting the spine.  Wear and tear (degeneration) of the spinal discs.  Arthritis.  Direct injury to the back. DIAGNOSIS Most of the time, the direct cause of low back pain is not known.However, back pain can be treated effectively even when the exact cause of the pain is unknown.Answering your caregiver's questions about your overall health and symptoms is one of the most accurate ways to make sure the cause of your pain is not dangerous. If your caregiver needs more information, he or she may order lab work or imaging tests (X-rays or MRIs).However, even if imaging tests show changes in your back, this usually does not require surgery. HOME CARE INSTRUCTIONS For many people, back pain returns.Since low back pain is rarely dangerous, it is often a condition that people can learn to manageon their own.   Remain active. It is stressful on the back to sit or stand in one place. Do not sit, drive, or stand in one place for more than 30 minutes at a time. Take short walks on level surfaces as soon as pain allows.Try to increase the length of time you walk each day.  Do not stay in bed.Resting more than 1 or 2 days can delay your recovery.  Do not avoid exercise or work.Your body is made to move.It is not dangerous to be active, even though your back may hurt.Your back will likely heal faster if you return to being active before your pain is gone.  Pay attention to your body when you bend and lift. Many people have less discomfortwhen lifting if they bend their knees, keep the load close to their bodies,and  avoid twisting. Often, the most comfortable positions are those that put less stress on your recovering back.  Find a comfortable position to sleep. Use a firm mattress and lie on your side with your knees slightly bent. If you lie on your back, put a pillow under your knees.  Only take over-the-counter or prescription medicines as directed by your caregiver. Over-the-counter medicines to reduce pain and inflammation are often the most helpful.Your caregiver may prescribe muscle relaxant drugs.These medicines help dull your pain so you can more quickly return to your normal activities and healthy exercise.  Put ice on the injured area.  Put ice in a plastic bag.  Place a towel between your skin and the bag.  Leave the ice on for 15 to 20 minutes, 3 to 4 times a day for the first 2 to 3 days. After that, ice and heat may be alternated to reduce pain and spasms.  Ask your caregiver about trying back exercises and gentle massage. This may be of some benefit.  Avoid feeling anxious or stressed.Stress increases muscle tension and can worsen back pain.It is important to recognize when you are anxious or stressed and learn ways to manage it.Exercise is a great option. SEEK MEDICAL CARE IF:  You have pain that is not relieved with rest or medicine.  You have pain that does not improve in 1 week.  You have new symptoms.  You are generally   not feeling well. SEEK IMMEDIATE MEDICAL CARE IF:   You have pain that radiates from your back into your legs.  You develop new bowel or bladder control problems.  You have unusual weakness or numbness in your arms or legs.  You develop nausea or vomiting.  You develop abdominal pain.  You feel faint. Document Released: 08/05/2005 Document Revised: 02/04/2012 Document Reviewed: 12/24/2010 [employer] Patient Information 2013 [employer].  

## 2012-05-28 NOTE — Progress Notes (Signed)
Subjective:    Patient ID: Matthew [name], male    DOB: 04-22-1971, 41 y.o.   MRN: [id number]  Back Pain This is a recurrent problem. The current episode started 1 to 4 weeks ago. The problem occurs intermittently. The problem has been gradually worsening since onset. The pain is present in the lumbar spine. The quality of the pain is described as aching. The pain does not radiate. The pain is at a severity of 3/10. The pain is mild. The pain is worse during the day. The symptoms are aggravated by bending and position. Stiffness is present in the morning. Pertinent negatives include no abdominal pain, bladder incontinence, bowel incontinence, chest pain, dysuria, fever, headaches, leg pain, numbness, paresis, paresthesias, pelvic pain, perianal numbness, tingling, weakness or weight loss. He has tried analgesics (percocet) for the symptoms. The treatment provided mild relief.      Review of Systems  Constitutional: Negative for fever, chills, weight loss, diaphoresis, activity change, appetite change, fatigue and unexpected weight change.  HENT: Negative.   Eyes: Negative.   Respiratory: Negative for cough, chest tightness, shortness of breath, wheezing and stridor.   Cardiovascular: Negative for chest pain, palpitations and leg swelling.  Gastrointestinal: Positive for nausea. Negative for vomiting, abdominal pain, diarrhea, constipation, blood in stool, abdominal distention and bowel incontinence.  Genitourinary: Negative for bladder incontinence, dysuria, urgency, frequency, hematuria, flank pain, decreased urine volume, discharge, penile swelling, scrotal swelling, enuresis, difficulty urinating, genital sores, penile pain, testicular pain and pelvic pain.  Musculoskeletal: Positive for myalgias and back pain. Negative for joint swelling, arthralgias and gait problem.  Skin: Negative for color change, pallor, rash and wound.  Neurological: Negative for dizziness, tingling, tremors,  seizures, syncope, facial asymmetry, speech difficulty, weakness, light-headedness, numbness, headaches and paresthesias.  Hematological: Negative for adenopathy. Does not bruise/bleed easily.  Psychiatric/Behavioral: Positive for confusion. Negative for suicidal ideas, hallucinations, behavioral problems, disturbed wake/sleep cycle, self-injury, dysphoric mood, decreased concentration and agitation. The patient is not nervous/anxious and is not hyperactive.        Objective:   Physical Exam  Vitals reviewed. Constitutional: He is oriented to person, place, and time. Vital signs are normal. He appears well-developed and well-nourished.  Non-toxic appearance. He does not have a sickly appearance. He does not appear ill. No distress.  HENT:  Head: Normocephalic and atraumatic.  Mouth/Throat: Oropharynx is clear and moist. No oropharyngeal exudate.  Eyes: Conjunctivae normal are normal. Right eye exhibits no discharge. Left eye exhibits no discharge. No scleral icterus.  Neck: Normal range of motion. Neck supple. No JVD present. No tracheal deviation present. No thyromegaly present.  Cardiovascular: Normal rate, regular rhythm, normal heart sounds and intact distal pulses.  Exam reveals no gallop and no friction rub.   No murmur heard. Pulmonary/Chest: Effort normal and breath sounds normal. No stridor. No respiratory distress. He has no wheezes. He has no rales. He exhibits no tenderness.  Abdominal: Soft. Bowel sounds are normal. He exhibits no distension and no mass. There is no tenderness. There is no rebound and no guarding.  Musculoskeletal: Normal range of motion. He exhibits no edema and no tenderness.       Lumbar back: Normal. He exhibits normal range of motion, no tenderness, no bony tenderness, no swelling, no edema, no deformity, no laceration, no pain, no spasm and normal pulse.  Lymphadenopathy:    He has no cervical adenopathy.  Neurological: He is alert and oriented to person,  place, and time. He has normal strength. He displays no  atrophy, no tremor and normal reflexes. No cranial nerve deficit or sensory deficit. He exhibits normal muscle tone. He displays a negative Romberg sign. He displays no seizure activity. Coordination and gait normal. He displays no Babinski's sign on the right side. He displays no Babinski's sign on the left side.  Reflex Scores:      Tricep reflexes are 1+ on the right side and 1+ on the left side.      Bicep reflexes are 1+ on the right side and 1+ on the left side.      Brachioradialis reflexes are 1+ on the right side and 1+ on the left side.      Patellar reflexes are 1+ on the right side and 1+ on the left side.      Achilles reflexes are 1+ on the right side and 1+ on the left side. Skin: Skin is warm and dry. No rash noted. He is not diaphoretic. No erythema. No pallor.  Psychiatric: He has a normal mood and affect. His behavior is normal. Judgment and thought content normal.      Lab Results  Component Value Date   WBC 9.9 02/18/2012   HGB 15.3 02/18/2012   HCT 44.9 02/18/2012   PLT 208.0 02/18/2012   GLUCOSE 92 03/12/2012   CHOL 143 01/21/2011   TRIG 174.0* 01/21/2011   HDL 31.20* 01/21/2011   LDLCALC 77 01/21/2011   ALT 61* 03/12/2012   AST 26 03/12/2012   NA 138 03/12/2012   K 4.0 03/12/2012   CL 102 03/12/2012   CREATININE 0.7 03/12/2012   BUN 10 03/12/2012   CO2 29 03/12/2012   TSH 2.12 01/31/2012   PSA 1.28 03/12/2012   HGBA1C 5.5 03/12/2012      Assessment & Plan:

## 2012-05-29 LAB — DRUGS OF ABUSE SCREEN W/O ALC, ROUTINE URINE
Benzodiazepines.: NEGATIVE
Creatinine,U: 115.2 mg/dL
Methadone: NEGATIVE
Phencyclidine (PCP): NEGATIVE
Propoxyphene: NEGATIVE

## 2012-05-29 NOTE — Assessment & Plan Note (Signed)
I will recheck his LFT's today 

## 2012-05-29 NOTE — Assessment & Plan Note (Signed)
His plain films show DDD but nothing acute, I have asked him consider seeing a surgeon - he will let me know if he wants to pursue this

## 2012-05-29 NOTE — Assessment & Plan Note (Signed)
It appears that this is related to the percocet, he asked to be tested for lyme dz today, I will also check a UDS, will look at other labs as well to see if there is any evidence of infection, inflammation

## 2012-05-31 LAB — B. BURGDORFI ANTIBODIES BY WB
B burgdorferi IgG Abs (IB): NEGATIVE
B burgdorferi IgM Abs (IB): NEGATIVE

## 2012-06-26 ENCOUNTER — Telehealth: Payer: Self-pay | Admitting: Internal Medicine

## 2012-06-26 ENCOUNTER — Encounter: Payer: Self-pay | Admitting: Internal Medicine

## 2012-06-26 ENCOUNTER — Ambulatory Visit (INDEPENDENT_AMBULATORY_CARE_PROVIDER_SITE_OTHER): Payer: Self-pay | Admitting: Internal Medicine

## 2012-06-26 VITALS — BP 108/78 | HR 95 | Temp 97.2°F | Ht 77.0 in | Wt 251.0 lb

## 2012-06-26 DIAGNOSIS — M5430 Sciatica, unspecified side: Secondary | ICD-10-CM

## 2012-06-26 DIAGNOSIS — M419 Scoliosis, unspecified: Secondary | ICD-10-CM

## 2012-06-26 DIAGNOSIS — M4120 Other idiopathic scoliosis, site unspecified: Secondary | ICD-10-CM

## 2012-06-26 DIAGNOSIS — M545 Low back pain: Secondary | ICD-10-CM

## 2012-06-26 DIAGNOSIS — M546 Pain in thoracic spine: Secondary | ICD-10-CM

## 2012-06-26 MED ORDER — CYCLOBENZAPRINE HCL 10 MG PO TABS: 10.0000 mg | ORAL_TABLET | Freq: Three times a day (TID) | ORAL | Status: DC | PRN

## 2012-06-26 MED ORDER — METHYLPREDNISOLONE ACETATE 80 MG/ML IJ SUSP
120.0000 mg | Freq: Once | INTRAMUSCULAR | Status: AC
  Administered 2012-06-26: 120 mg via INTRAMUSCULAR

## 2012-06-26 MED ORDER — PREDNISONE 20 MG PO TABS: ORAL_TABLET | ORAL | Status: DC

## 2012-06-26 MED ORDER — METHYLPREDNISOLONE ACETATE 80 MG/ML IJ SUSP: 120.0000 mg | Freq: Once | INTRAMUSCULAR | Status: DC

## 2012-06-26 MED ORDER — OXYCODONE-ACETAMINOPHEN 5-325 MG PO TABS: 1.0000 | ORAL_TABLET | ORAL | Status: DC | PRN

## 2012-06-26 NOTE — Patient Instructions (Signed)
Back Exercises These exercises may help you when beginning to rehabilitate your injury. Your symptoms may resolve with or without further involvement from your physician, physical therapist or athletic trainer. While completing these exercises, remember:    Restoring tissue flexibility helps normal motion to return to the joints. This allows healthier, less painful movement and activity.   An effective stretch should be held for at least 30 seconds.   A stretch should never be painful. You should only feel a gentle lengthening or release in the stretched tissue.  STRETCH  Extension, Prone on Elbows   Lie on your stomach on the floor, a bed will be too soft. Place your palms about shoulder width apart and at the height of your head.   Place your elbows under your shoulders. If this is too painful, stack pillows under your chest.   Allow your body to relax so that your hips drop lower and make contact more completely with the floor.   Hold this position for __________ seconds.   Slowly return to lying flat on the floor.  Repeat __________ times. Complete this exercise __________ times per day.   RANGE OF MOTION  Extension, Prone Press Ups   Lie on your stomach on the floor, a bed will be too soft. Place your palms about shoulder width apart and at the height of your head.   Keeping your back as relaxed as possible, slowly straighten your elbows while keeping your hips on the floor. You may adjust the placement of your hands to maximize your comfort. As you gain motion, your hands will come more underneath your shoulders.   Hold this position __________ seconds.   Slowly return to lying flat on the floor.  Repeat __________ times. Complete this exercise __________ times per day.   RANGE OF MOTION- Quadruped, Neutral Spine   Assume a hands and knees position on a firm surface. Keep your hands under your shoulders and your knees under your hips. You may place padding under your knees for  comfort.   Drop your head and point your tail bone toward the ground below you. This will round out your low back like an angry cat. Hold this position for __________ seconds.   Slowly lift your head and release your tail bone so that your back sags into a large arch, like an old horse.   Hold this position for __________ seconds.   Repeat this until you feel limber in your low back.   Now, find your "sweet spot." This will be the most comfortable position somewhere between the two previous positions. This is your neutral spine. Once you have found this position, tense your stomach muscles to support your low back.   Hold this position for __________ seconds.  Repeat __________ times. Complete this exercise __________ times per day.   STRETCH  Flexion, Single Knee to Chest   Lie on a firm bed or floor with both legs extended in front of you.   Keeping one leg in contact with the floor, bring your opposite knee to your chest. Hold your leg in place by either grabbing behind your thigh or at your knee.   Pull until you feel a gentle stretch in your low back. Hold __________ seconds.   Slowly release your grasp and repeat the exercise with the opposite side.  Repeat __________ times. Complete this exercise __________ times per day.   STRETCH - Hamstrings, Standing  Stand or sit and extend your right / left leg, placing  your foot on a chair or foot stool   Keeping a slight arch in your low back and your hips straight forward.   Lead with your chest and lean forward at the waist until you feel a gentle stretch in the back of your right / left knee or thigh. (When done correctly, this exercise requires leaning only a small distance.)   Hold this position for __________ seconds.  Repeat __________ times. Complete this stretch __________ times per day. STRENGTHENING  Deep Abdominals, Pelvic Tilt   Lie on a firm bed or floor. Keeping your legs in front of you, bend your knees so they are  both pointed toward the ceiling and your feet are flat on the floor.   Tense your lower abdominal muscles to press your low back into the floor. This motion will rotate your pelvis so that your tail bone is scooping upwards rather than pointing at your feet or into the floor.   With a gentle tension and even breathing, hold this position for __________ seconds.  Repeat __________ times. Complete this exercise __________ times per day.   STRENGTHENING  Abdominals, Crunches   Lie on a firm bed or floor. Keeping your legs in front of you, bend your knees so they are both pointed toward the ceiling and your feet are flat on the floor. Cross your arms over your chest.   Slightly tip your chin down without bending your neck.   Tense your abdominals and slowly lift your trunk high enough to just clear your shoulder blades. Lifting higher can put excessive stress on the low back and does not further strengthen your abdominal muscles.   Control your return to the starting position.  Repeat __________ times. Complete this exercise __________ times per day.   STRENGTHENING  Quadruped, Opposite UE/LE Lift   Assume a hands and knees position on a firm surface. Keep your hands under your shoulders and your knees under your hips. You may place padding under your knees for comfort.   Find your neutral spine and gently tense your abdominal muscles so that you can maintain this position. Your shoulders and hips should form a rectangle that is parallel with the floor and is not twisted.   Keeping your trunk steady, lift your right hand no higher than your shoulder and then your left leg no higher than your hip. Make sure you are not holding your breath. Hold this position __________ seconds.   Continuing to keep your abdominal muscles tense and your back steady, slowly return to your starting position. Repeat with the opposite arm and leg.  Repeat __________ times. Complete this exercise __________ times per  day. Document Released: 08/23/2005 Document Revised: 10/28/2011 Document Reviewed: 11/17/2008 [facility] Patient Information 2013 [place], [place].   Back Injury Prevention Back injuries can be extremely painful and difficult to heal. After having one back injury, you are much more likely to experience another later on. It is important to learn how to avoid injuring or re-injuring your back. The following tips can help you to prevent a back injury. PHYSICAL FITNESS  Exercise regularly and try to develop good tone in your abdominal muscles. Your abdominal muscles provide a lot of the support needed by your back.   Do aerobic exercises (walking, jogging, biking, swimming) regularly.   Do exercises that increase balance and strength (tai chi, yoga) regularly. This can decrease your risk of falling and injuring your back.   Stretch before and after exercising.   Maintain a healthy  weight. The more you weigh, the more stress is placed on your back. For every pound of weight, 10 times that amount of pressure is placed on the back.  DIET  Talk to your caregiver about how much calcium and vitamin D you need per day. These nutrients help to prevent weakening of the bones (osteoporosis). Osteoporosis can cause broken (fractured) bones that lead to back pain.   Include good sources of calcium in your diet, such as dairy products, green, leafy vegetables, and products with calcium added (fortified).   Include good sources of vitamin D in your diet, such as milk and foods that are fortified with vitamin D.   Consider taking a nutritional supplement or a multivitamin if needed.   Stop smoking if you smoke.  POSTURE  Sit and stand up straight. Avoid leaning forward when you sit or hunching over when you stand.   Choose chairs with good low back (lumbar) support.   If you work at a desk, sit close to your work so you do not need to lean over. Keep your chin tucked in. Keep your neck drawn back and  elbows bent at a right angle. Your arms should look like the letter "L."   Sit high and close to the steering wheel when you drive. Add a lumbar support to your car seat if needed.   Avoid sitting or standing in one position for too long. Take breaks to get up, stretch, and walk around at least once every hour. Take breaks if you are driving for long periods of time.   Sleep on your side with your knees slightly bent, or sleep on your back with a pillow under your knees. Do not sleep on your stomach.  LIFTING, TWISTING, AND REACHING  Avoid heavy lifting, especially repetitive lifting. If you must do heavy lifting:   Stretch before lifting.   Work slowly.   Rest between lifts.   Use carts and dollies to move objects when possible.   Make several small trips instead of carrying 1 heavy load.   Ask for help when you need it.   Ask for help when moving big, awkward objects.   Follow these steps when lifting:   Stand with your feet shoulder-width apart.   Get as close to the object as you can. Do not try to pick up heavy objects that are far from your body.   Use handles or lifting straps if they are available.   Bend at your knees. Squat down, but keep your heels off the floor.   Keep your shoulders pulled back, your chin tucked in, and your back straight.   Lift the object slowly, tightening the muscles in your legs, abdomen, and buttocks. Keep the object as close to the center of your body as possible.   When you put a load down, use these same guidelines in reverse.   Do not:   Lift the object above your waist.   Twist at the waist while lifting or carrying a load. Move your feet if you need to turn, not your waist.   Bend over without bending at your knees.   Avoid reaching over your head, across a table, or for an object on a high surface.  OTHER TIPS  Avoid wet floors and keep sidewalks clear of ice to prevent falls.   Do not sleep on a mattress that is too  soft or too hard.   Keep items that are used frequently within easy reach.  Put heavier objects on shelves at waist level and lighter objects on lower or higher shelves.   Find ways to decrease your stress, such as exercise, massage, or relaxation techniques. Stress can build up in your muscles. Tense muscles are more vulnerable to injury.   Seek treatment for depression or anxiety if needed. These conditions can increase your risk of developing back pain.  SEEK MEDICAL CARE IF:  You injure your back.   You have questions about diet, exercise, or other ways to prevent back injuries.  MAKE SURE YOU:  Understand these instructions.   Will watch your condition.   Will get help right away if you are not doing well or get worse.  Document Released: 09/12/2004 Document Revised: 10/28/2011 Document Reviewed: 09/16/2011 [facility] Patient Information 2013 [place], [place].

## 2012-06-26 NOTE — Addendum Note (Signed)
Addended by: Brenton Grills C on: 06/26/2012 11:38 AM   Modules accepted: Orders

## 2012-06-26 NOTE — Progress Notes (Signed)
Subjective:    Patient ID: Matthew [name], male    DOB: [birth year], 41 y.o.   MRN: [id number]  HPI Pt presents to clinic today with low back pain. This is a chronic problem that started about 5 weeks ago. Pt has tried taking percocet for the pain but only obtains mild relief. He has also taken flexeril. He had an xray done that showed DDD but no acute fractures or injuries. The pain is now constant and he is not getting any relief. He states that the pain is now traveling down his right buttocks and feels like a stabbing sensation. The only thing that relieves his pain is lying down. The pain is aggravated by moving and sitting.Review of Systems      Past Medical History  Diagnosis Date  . Low back pain   . Asthma     Current Outpatient Prescriptions  Medication Sig Dispense Refill  . oxyCODONE-acetaminophen (PERCOCET/ROXICET) 5-325 MG per tablet Take 1 tablet by mouth every 4 (four) hours as needed.  65 tablet  0    No Known Allergies  Family History  Problem Relation Age of Onset  . Hypertension Other   . Alcohol abuse Neg Hx   . Cancer Neg Hx   . Diabetes Neg Hx   . Heart disease Neg Hx   . Hyperlipidemia Neg Hx   . Stroke Neg Hx     History   Social History  . Marital Status: Married    Spouse Name: N/A    Number of Children: N/A  . Years of Education: N/A   Occupational History  . Not on file.   Social History Main Topics  . Smoking status: Current Every Day Smoker -- 1.0 packs/day    Types: Cigarettes  . Smokeless tobacco: Never Used  . Alcohol Use: No  . Drug Use: No  . Sexually Active: Yes    Birth Control/ Protection: None   Other Topics Concern  . Not on file   Social History Narrative  . No narrative on file     Constitutional: Denies fever, malaise, fatigue, headache or abrupt weight changes.  Respiratory: Denies difficulty breathing, shortness of breath, cough or sputum production.   Cardiovascular: Denies chest pain, chest tightness,  palpitations or swelling in the hands or feet.  Musculoskeletal:  Pt reports lower back pain and muscle soreness radiating into his right hip.. Denies decrease in range of motion, difficulty with gait or joint pain and swelling.  Neurological: Denies dizziness, difficulty with memory, difficulty with speech or problems with balance and coordination.   No other specific complaints in a complete review of systems (except as listed in HPI above).  Objective:   Physical Exam   Wt Readings from Last 3 Encounters:  05/28/12 254 lb 8 oz (115.44 kg)  03/12/12 246 lb (111.585 kg)  02/18/12 242 lb 1.9 oz (109.825 kg)    General: Appears his stated age, well developed, well nourished in NAD. Cardiovascular: Normal rate and rhythm. S1,S2 noted.  No murmur, rubs or gallops noted. No JVD or BLE edema. No carotid bruits noted. Pulmonary/Chest: Normal effort and positive vesicular breath sounds. No respiratory distress. No wheezes, rales or ronchi noted.  Musculoskeletal:  Positive straight leg raise on the right.Pinpoint pain at the SI joint on the right. Normal range of motion. No signs of joint swelling. No difficulty with gait.  Neurological: Alert and oriented. Cranial nerves II-XII intact. Coordination normal. +DTRs bilaterally.    Assessment & Plan:   Lumbago  with radicular symptoms, chronic problem, additional workup required.  Refill given for percocet and flexeril eRx given for steroid taper 120 mg Depo-Medrol IM today Offered referral for physical therapy- pt declined at this time. Referral given for orthopedics. Work note given. Back exercises given, see patient handout.  RTC as needed or if symotoms persist.

## 2012-06-26 NOTE — Telephone Encounter (Signed)
Caller: Matthew [name]/Patient; Patient Name: Matthew [name]; PCP: Sanda Linger (Adults only); Best Callback Phone Number: [phone].  Patient calling about back pain.  ONset 1 week ago.  States pain has not resolved but in fact is worse.  Does not recall specific injury to the back.  Seen in office most recently 05/28/12 with similar symptoms.  States there is  pain/tenderness through R hip and buttock.  Per back symptoms protocol, emergent symtpoms denied; advised appt within 24 hours.  Appt scheduled 06/26/12 1100 with Ms. Sampson Si.

## 2013-01-07 IMAGING — CT CT ANGIO CHEST
2 of 6 series · 19 of 36 positions shown · IV contrast (Omnipaque 300)
Comparison: None.

CLINICAL DATA: Chest pain.  Upper back pain.

CT ANGIOGRAPHY CHEST
TECHNIQUE: Multidetector CT imaging of the chest using the
standard protocol during bolus administration of intravenous
contrast. Multiplanar reconstructed images including MIPs were
obtained and reviewed to evaluate the vascular anatomy.
Contrast: 80mL OMNIPAQUE IOHEXOL 300 MG/ML  SOLN

[Series 5: thins (id) / (id) · axial · 0.75mm/px · z∈[-294,-38]mm · 18 of 286 slices shown]
[im 15/286  lung]
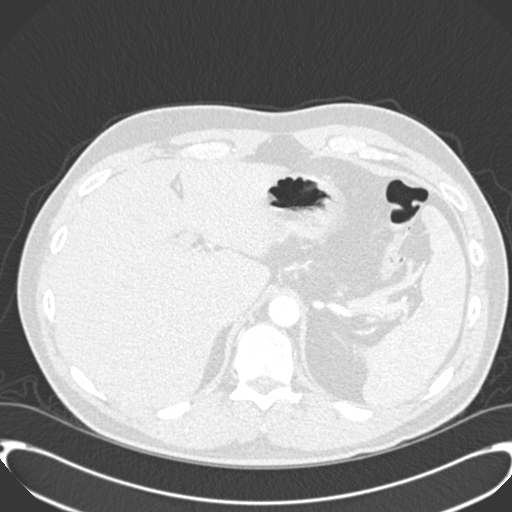
[im 29/286  mediastinal]
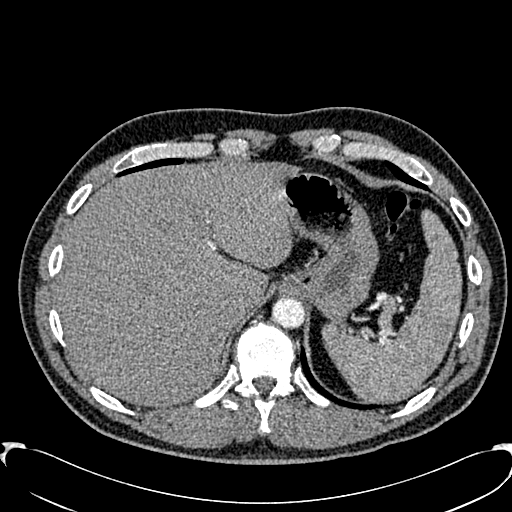
[im 43/286  lung]
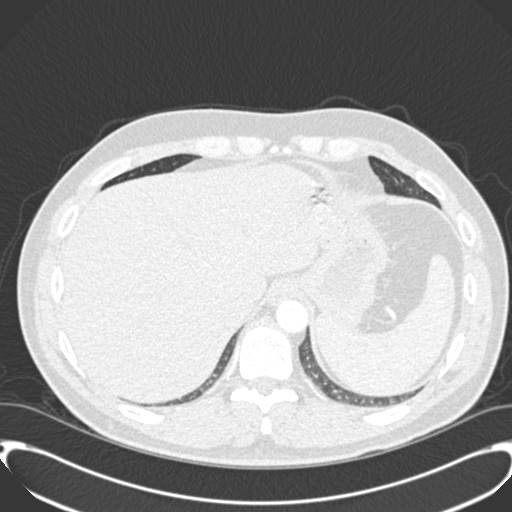
[im 58/286  mediastinal]
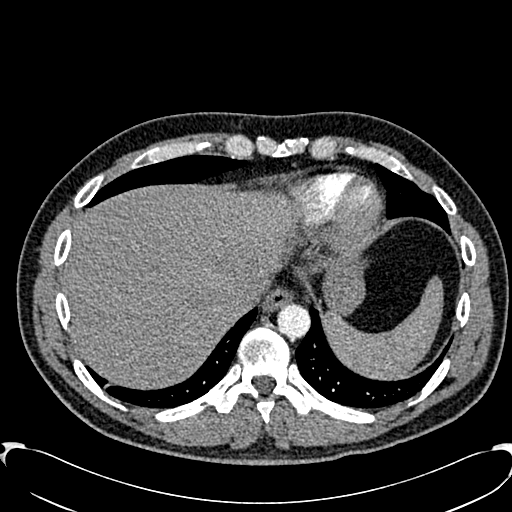
[im 72/286  lung]
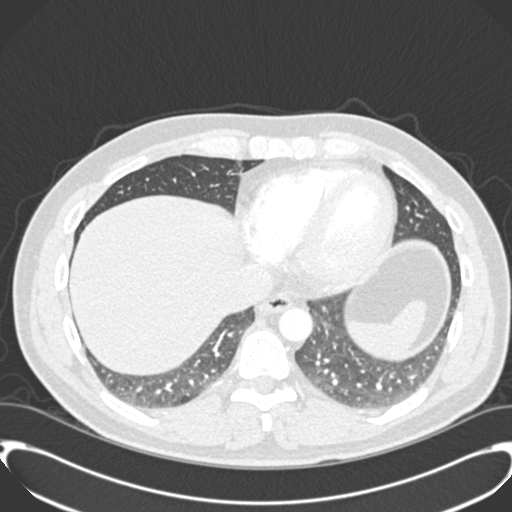
[im 86/286  mediastinal]
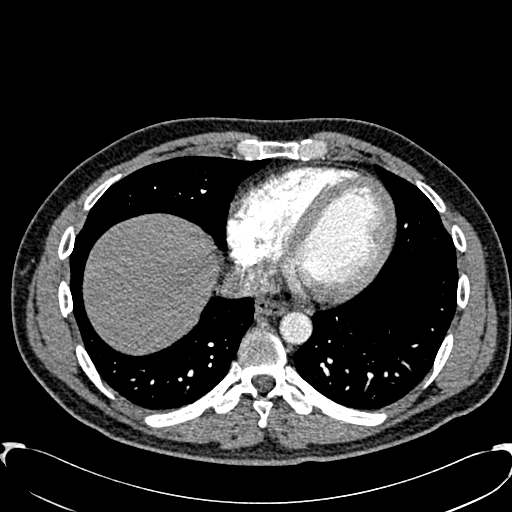
[im 100/286  lung]
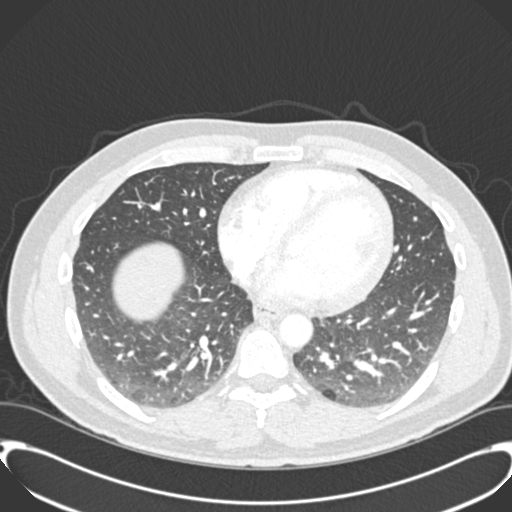
[im 115/286  mediastinal]
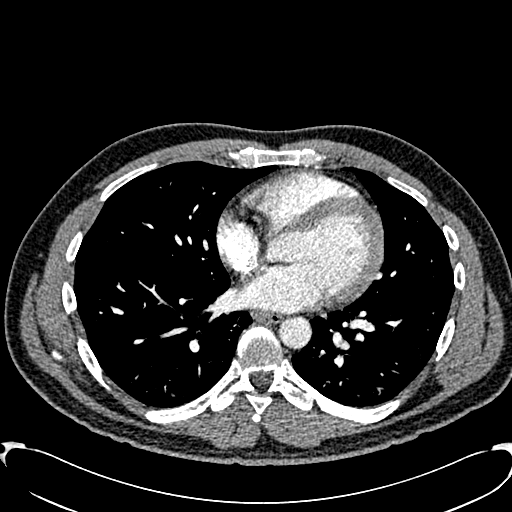
[im 129/286  lung]
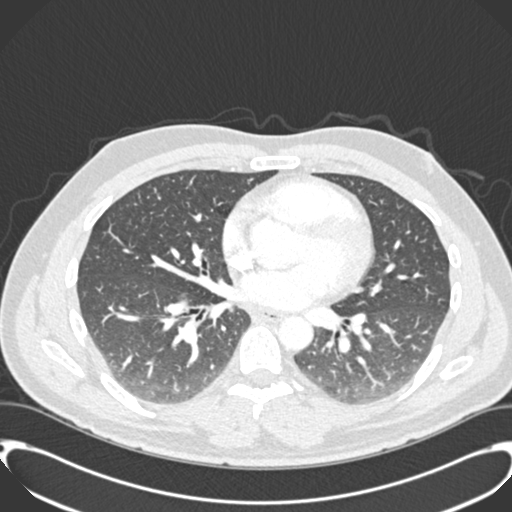
[im 157/286  mediastinal]
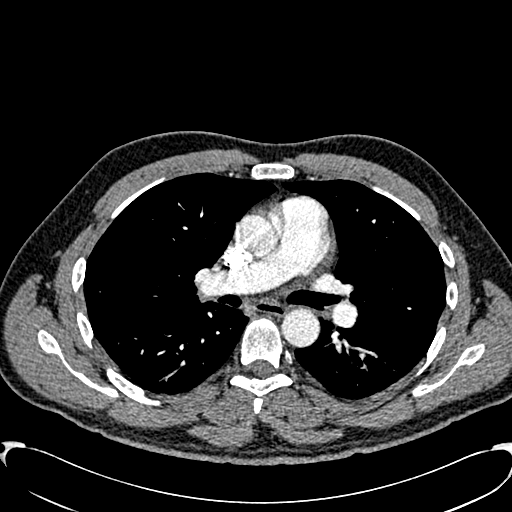
[im 172/286  lung]
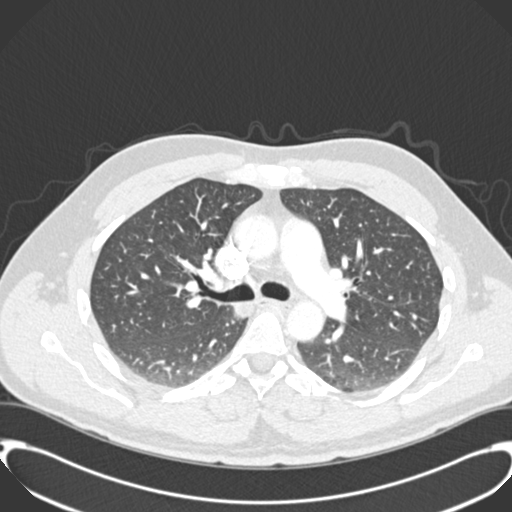
[im 186/286  mediastinal]
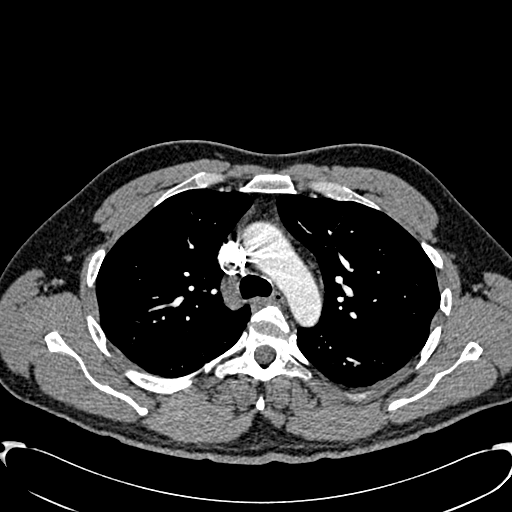
[im 200/286  lung]
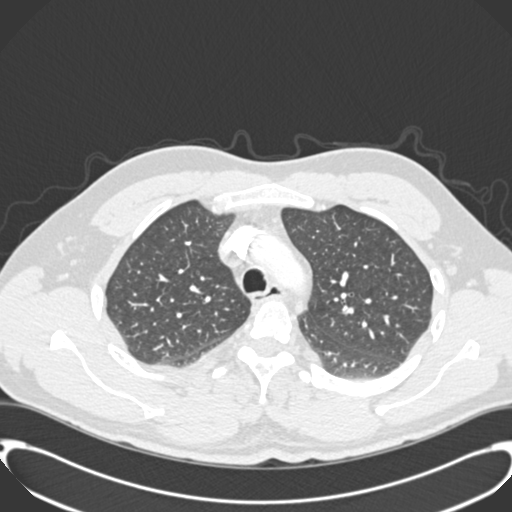
[im 214/286  mediastinal]
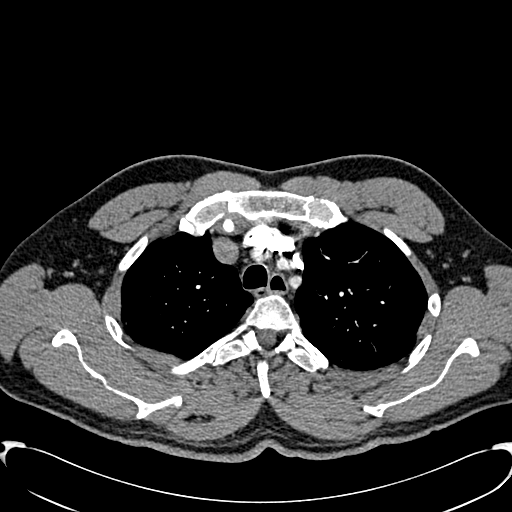
[im 229/286  lung]
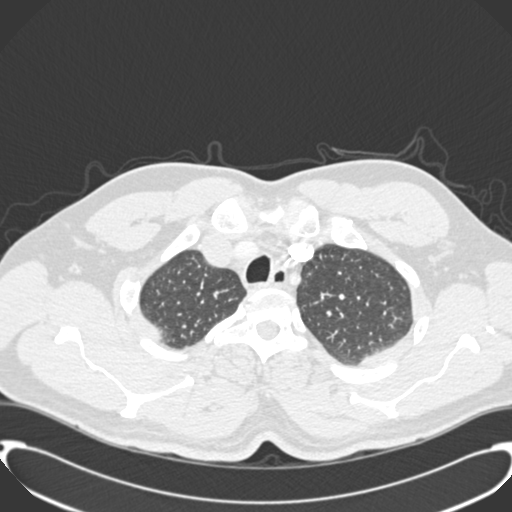
[im 243/286  mediastinal]
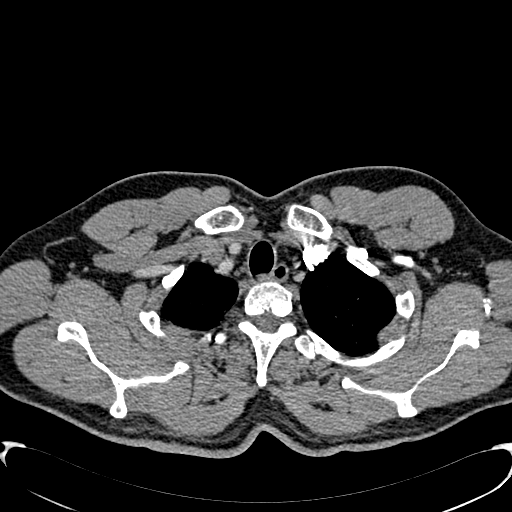
[im 257/286  lung]
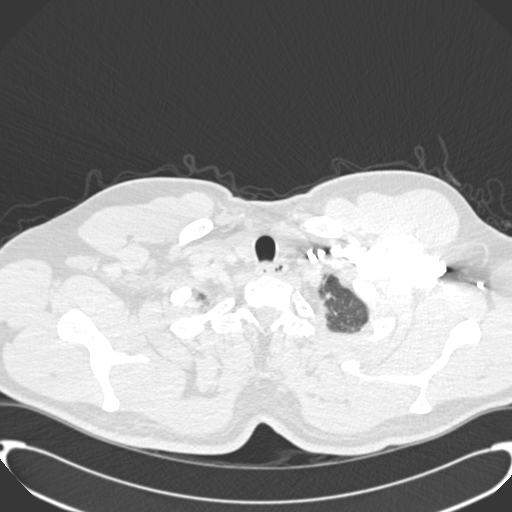
[im 271/286  mediastinal]
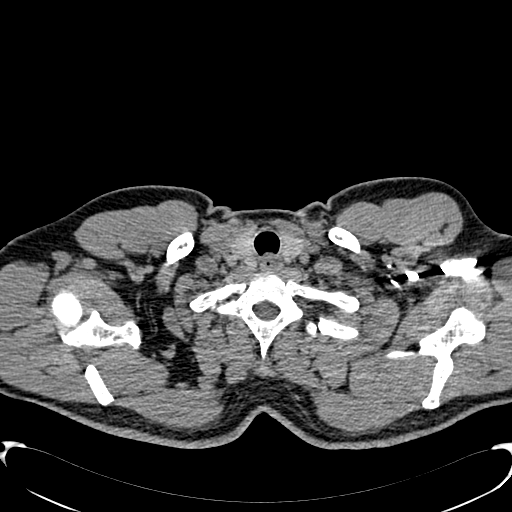

[Series 602: cor mpr · coronal · 0.75mm/px · 1 of 108 slices shown]
[im 54/108  mediastinal]
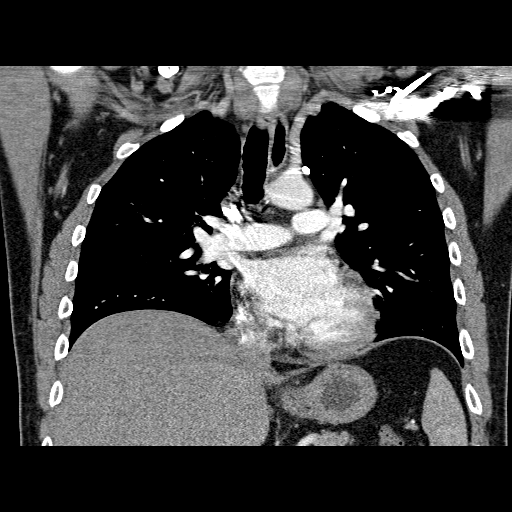

[19 of 36 positions shown; findings below may reference images not displayed]

FINDINGS: There are no pulmonary emboli, infiltrates, or effusions.
No hilar or mediastinal adenopathy.  Heart size is normal.

Minimal emphysematous changes at the lung bases posteriorly.
Minimal thoracic scoliosis.

No acute osseous abnormalities.
IMPRESSION: Slight emphysematous disease in the lower lobes.

Otherwise, no significant abnormality.  Specifically, no pulmonary
emboli.

## 2013-07-05 ENCOUNTER — Ambulatory Visit: Payer: 59 | Attending: Family Medicine | Admitting: Physical Therapy

## 2013-07-05 DIAGNOSIS — M545 Low back pain, unspecified: Secondary | ICD-10-CM | POA: Insufficient documentation

## 2013-07-05 DIAGNOSIS — IMO0001 Reserved for inherently not codable concepts without codable children: Secondary | ICD-10-CM | POA: Insufficient documentation

## 2013-07-05 DIAGNOSIS — R293 Abnormal posture: Secondary | ICD-10-CM | POA: Insufficient documentation

## 2013-07-05 DIAGNOSIS — M2560 Stiffness of unspecified joint, not elsewhere classified: Secondary | ICD-10-CM | POA: Insufficient documentation

## 2013-07-08 ENCOUNTER — Ambulatory Visit: Payer: 59 | Admitting: Physical Therapy

## 2013-07-13 ENCOUNTER — Ambulatory Visit: Payer: 59 | Admitting: Physical Therapy

## 2013-07-19 ENCOUNTER — Ambulatory Visit: Payer: 59 | Attending: Internal Medicine | Admitting: Physical Therapy

## 2013-07-19 DIAGNOSIS — M545 Low back pain, unspecified: Secondary | ICD-10-CM | POA: Insufficient documentation

## 2013-07-19 DIAGNOSIS — R293 Abnormal posture: Secondary | ICD-10-CM | POA: Insufficient documentation

## 2013-07-19 DIAGNOSIS — M2560 Stiffness of unspecified joint, not elsewhere classified: Secondary | ICD-10-CM | POA: Insufficient documentation

## 2013-07-19 DIAGNOSIS — IMO0001 Reserved for inherently not codable concepts without codable children: Secondary | ICD-10-CM | POA: Insufficient documentation

## 2013-07-21 ENCOUNTER — Encounter: Payer: 59 | Admitting: Physical Therapy

## 2014-02-03 ENCOUNTER — Ambulatory Visit (INDEPENDENT_AMBULATORY_CARE_PROVIDER_SITE_OTHER): Payer: 59 | Admitting: Psychiatry

## 2014-02-03 ENCOUNTER — Encounter (HOSPITAL_COMMUNITY): Payer: Self-pay | Admitting: Psychiatry

## 2014-02-03 ENCOUNTER — Encounter (INDEPENDENT_AMBULATORY_CARE_PROVIDER_SITE_OTHER): Payer: Self-pay

## 2014-02-03 VITALS — BP 126/81 | HR 92 | Ht 78.0 in | Wt 244.4 lb

## 2014-02-03 DIAGNOSIS — F410 Panic disorder [episodic paroxysmal anxiety] without agoraphobia: Secondary | ICD-10-CM | POA: Insufficient documentation

## 2014-02-03 DIAGNOSIS — F3181 Bipolar II disorder: Secondary | ICD-10-CM | POA: Insufficient documentation

## 2014-02-03 DIAGNOSIS — F3189 Other bipolar disorder: Secondary | ICD-10-CM

## 2014-02-03 LAB — CBC WITH DIFFERENTIAL/PLATELET
Basophils Absolute: 0 10*3/uL (ref 0.0–0.1)
Basophils Relative: 0 % (ref 0–1)
Eosinophils Absolute: 0.2 10*3/uL (ref 0.0–0.7)
Eosinophils Relative: 3 % (ref 0–5)
HCT: 46.1 % (ref 39.0–52.0)
HEMOGLOBIN: 16.2 g/dL (ref 13.0–17.0)
LYMPHS ABS: 2.6 10*3/uL (ref 0.7–4.0)
LYMPHS PCT: 34 % (ref 12–46)
MCH: 31.6 pg (ref 26.0–34.0)
MCHC: 35.1 g/dL (ref 30.0–36.0)
MCV: 89.9 fL (ref 78.0–100.0)
MONOS PCT: 7 % (ref 3–12)
Monocytes Absolute: 0.5 10*3/uL (ref 0.1–1.0)
NEUTROS ABS: 4.2 10*3/uL (ref 1.7–7.7)
NEUTROS PCT: 56 % (ref 43–77)
Platelets: 171 10*3/uL (ref 150–400)
RBC: 5.13 MIL/uL (ref 4.22–5.81)
RDW: 14.2 % (ref 11.5–15.5)
WBC: 7.5 10*3/uL (ref 4.0–10.5)

## 2014-02-03 LAB — COMPLETE METABOLIC PANEL WITH GFR
ALBUMIN: 4.4 g/dL (ref 3.5–5.2)
ALT: 39 U/L (ref 0–53)
AST: 20 U/L (ref 0–37)
Alkaline Phosphatase: 82 U/L (ref 39–117)
BUN: 12 mg/dL (ref 6–23)
CO2: 26 meq/L (ref 19–32)
Calcium: 9.1 mg/dL (ref 8.4–10.5)
Chloride: 105 mEq/L (ref 96–112)
Creat: 0.8 mg/dL (ref 0.50–1.35)
Glucose, Bld: 120 mg/dL — ABNORMAL HIGH (ref 70–99)
POTASSIUM: 4 meq/L (ref 3.5–5.3)
SODIUM: 138 meq/L (ref 135–145)
TOTAL PROTEIN: 6.9 g/dL (ref 6.0–8.3)
Total Bilirubin: 0.5 mg/dL (ref 0.2–1.2)

## 2014-02-03 LAB — HEMOGLOBIN A1C
Hgb A1c MFr Bld: 5.2 % (ref <5.7)
Mean Plasma Glucose: 103 mg/dL (ref <117)

## 2014-02-03 LAB — TSH: TSH: 0.713 u[IU]/mL (ref 0.350–4.500)

## 2014-02-03 LAB — T4, FREE: FREE T4: 1.12 ng/dL (ref 0.80–1.80)

## 2014-02-03 LAB — GAMMA GT: GGT: 46 U/L (ref 7–51)

## 2014-02-03 LAB — T3: T3, Total: 115.4 ng/dL (ref 80.0–204.0)

## 2014-02-03 MED ORDER — ALPRAZOLAM 0.5 MG PO TABS: 0.5000 mg | ORAL_TABLET | Freq: Every evening | ORAL | Status: DC | PRN

## 2014-02-03 NOTE — Progress Notes (Signed)
Psychiatric Assessment Adult  Patient Identification:  Matthew [name] Date of Evaluation:  02/03/2014 Chief Complaint: depression and anxiety History of Chief Complaint:   Chief Complaint  Patient presents with  . Anxiety  . Depression    HPI Comments: Pt reports he went to the ED a few times in the last 2 yrs for panic attacks. Pt described chest pain, GI distress, heart flutter, SOB and lack of motivation. He talked to his PCP who dx him with anxiety and depression and prescribed Celexa and Xanax. Pt has been on Celexa for 5 months now. States anxiety and depression improved for about 1 month until recently. Today describes on/off chest pain, lack of motivation, anhedonia, on/off passive thoughts of death without plan or intent (at least once a week). Later stated he was having passive SI without plan or intent and gave example of suddenly when driving having thought of wrecking car.  Pt reports low mood on near daily basis. Endorsing on/off feelings of hopelessness and worthlessness. He is having low frustration tolerance. Reports hx of anger that has improved with Celexa. When angry he is aware of what he is doing and will break things.  Also reports increased social isolation. Sleep is good with meds and he is getting about 6 hrs/night. He is taking Xanax at night b/c it helps him sleep. Energy is generally low. Appetite is ok. Concentration is poor and he is easily distracted. Denies HI.  Last 2 weeks he is having increased panic attacks. He describes on/off chest pain, numbness in hands and feet, SOB and can't find a trigger. It is happening for a few moments almost every day. Treats by waiting for symptoms to resolve.  Reports on going work stress. Work is demanding more from him. 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 Home life is busy with wife and 4 kids. Feels his wife demands attention and feels that she  doesn't focus on his problems.    Review of Systems  Constitutional: Positive for activity change and fatigue.  HENT: Negative.   Eyes: Negative.   Respiratory: Negative.   Cardiovascular: Negative.   Gastrointestinal: Negative.   Genitourinary: Negative.   Musculoskeletal: Positive for back pain.  Neurological: Negative.   Psychiatric/Behavioral: Positive for dysphoric mood and decreased concentration. The patient is nervous/anxious.    Physical Exam  Psychiatric: His speech is normal and behavior is normal. Judgment and thought content normal. Cognition and memory are normal. He exhibits a depressed mood.    Depressive Symptoms: yes see HPI  (Hypo) Manic Symptoms:  Reports hx of hypomanic like episodes- sleeping only 4 hr/night, increased energy, elevated mood, grandiouse thoughts, starting a lot of projects but not finishing anything, sexually inappropiate behavior. During this time he still goes to work and completes his responsibilities. Denies any inpt psych admission related to this behavior. Last time this happened was when he started Celexa in March. Denies drug and alcohol during these episodes.  Elevated Mood:  No Irritable Mood:  No Grandiosity:  No Distractibility:  No Labiality of Mood:  No Delusions:  No Hallucinations:  No Impulsivity:  No Sexually Inappropriate Behavior:  No Financial Extravagance:  No Flight of Ideas:  No  Anxiety Symptoms: Excessive Worry:  Worries all the time and concentration is poor. Reports fatigue but denies insomnia and racing thoughts. Denies muscle tension, HA, GI distress.  Panic Symptoms:  yes see HPI Agoraphobia:  No Obsessive Compulsive: No  Symptoms: None, Specific Phobias:  No Social Anxiety:  No  Psychotic Symptoms:  Hallucinations: No None Delusions:  No Paranoia:  No   Ideas of Reference:  No  PTSD Symptoms: Ever had a traumatic exposure:  No Had a traumatic exposure in the last month:  No Re-experiencing: No  None Hypervigilance:  No Hyperarousal: No None Avoidance: No None  Traumatic Brain Injury: No   Past Psychiatric History: Diagnosis: Anxiety and depression. Depression began in his teenage years and has been present on/off since then.   Hospitalizations: denies  Outpatient Care: denies  Substance Abuse Care: denies  Self-Mutilation: denies  Suicidal Attempts: denies  Violent Behaviors: reports when angry he will destroy property and break things. Last time was a few months ago, better with Celexa   Past Medical History:   Past Medical History  Diagnosis Date  . Low back pain   . Depression   . Anxiety   . Acute kidney failure 01/2012  . COPD (chronic obstructive pulmonary disease)    History of Loss of Consciousness:  No Seizure History:  No Cardiac History:  No Allergies:   Allergies  Allergen Reactions  . Ciprofloxacin     Acute kidney failure   Current Medications:  Current Outpatient Prescriptions  Medication Sig Dispense Refill  . ALPRAZolam (XANAX) 0.5 MG tablet Take 0.5 mg by mouth at bedtime as needed for anxiety.      . citalopram (CELEXA) 20 MG tablet Take 20 mg by mouth daily.      . cyclobenzaprine (FLEXERIL) 10 MG tablet Take 1 tablet (10 mg total) by mouth 3 (three) times daily as needed.  30 tablet  0  . gabapentin (NEURONTIN) 300 MG capsule Take 300 mg by mouth daily as needed.      . meloxicam (MOBIC) 7.5 MG tablet Take 7.5 mg by mouth daily as needed for pain.      . methylPREDNISolone acetate (DEPO-MEDROL) 80 MG/ML injection Inject 1.5 mLs (120 mg total) into the muscle once.  5 mL  0  . oxyCODONE-acetaminophen (PERCOCET/ROXICET) 5-325 MG per tablet Take 1 tablet by mouth every 4 (four) hours as needed.  65 tablet  0  . predniSONE (DELTASONE) 20 MG tablet Take 3 tablets (60 mg) on days 1-3, Take 40 mg (2 tablets) ondays 4-6, take 20 mg (1 tablet) on days 7-9, take 10 mg (1/2 tablet) on days 10-12  21 tablet  0   No current facility-administered  medications for this visit.    Previous Psychotropic Medications:  Medication Dose   denies                       Substance Abuse History in the last 12 months: Substance Age of 1st Use Last Use Amount Specific Type  Nicotine   today 1ppd cigs  Alcohol   2002 quit major use- would drink excessively on weekends but never to the point of losing his job   now drinks 2-3 drinks every month  wine, hard liquior  Cannabis   2002      Opiates  denies        Cocaine    2002      Methamphetamines  denies        LSD   8779yrs ago      Ecstasy  denies         Benzodiazepines  denies        Caffeine    today 4-5 sodas per day  Inhalants  denies        Others: denies                         Medical Consequences of Substance Abuse: denies  Legal Consequences of Substance Abuse: denies  Family Consequences of Substance Abuse: denies  Blackouts:  No DT's:  No Withdrawal Symptoms:  No None  Social History: Current Place of Residence: [place] with wife and 4 kdis Place of Birth: [place] Family Members: parents divorced when he was 5. Mom remarried and he lived with mom and step dad. Pt is oldest. He has 1 bro and 1 sister and 2 half sisters.  Marital Status:  Married for 12 yrs. 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 Legal History: denies Hobbies/Interests: denies  Family History:   Family History  Problem Relation Age of Onset  . Hypertension Other   . Cancer Neg Hx   . Diabetes Neg Hx   . Heart disease Neg Hx   . Hyperlipidemia Neg Hx   . Stroke Neg Hx   . Anxiety disorder Mother   . Alcohol abuse Father   . Drug abuse Father   . Bipolar disorder  Sister   . Alcohol abuse Brother   . Drug abuse Brother   . Bipolar disorder Paternal Aunt   . Schizophrenia Paternal Aunt     Mental Status Examination/Evaluation: Objective:  Appearance: Fairly Groomed and Neat  Eye Contact::  Good  Speech:  Clear and Coherent  Volume:  Normal  Mood:  depressed  Affect:  Congruent and Depressed  Thought Process:  Goal Directed, Linear and Logical  Orientation:  Full (Time, Place, and Person)  Thought Content:  WDL  Suicidal Thoughts:  Yes.  without intent/plan  Homicidal Thoughts:  No  Judgement:  Fair  Insight:  Fair  Psychomotor Activity:  Normal  Akathisia:  No  Handed:  Right  AIMS (if indicated):  n/a  Assets:  Communication Skills Desire for Improvement Financial Resources/Insurance Housing Intimacy Resilience Talents/Skills Transportation Vocational/Educational    Laboratory/X-Ray Psychological Evaluation(s)   will obtain labs denies   Assessment:  Bipolar II current episode depressed, Panic disorder  AXIS I Bipolar II current episode depressed, Panic disorder  AXIS II Deferred  AXIS III Past Medical History  Diagnosis Date  . Low back pain   . Depression   . Anxiety   . Acute kidney failure 01/2012  . COPD (chronic obstructive pulmonary disease)      AXIS IV educational problems, occupational problems and problems with primary support group  AXIS V 51-60 moderate symptoms   Treatment Plan/Recommendations:  Plan of Care:  Start trial of Lithium, risks/benefits and SE of the medication discussed. Pt verbalized understanding and verbal consent obtained for treatment.  Affirm with the patient that the medications are taken as ordered. Patient expressed understanding of how their medications were to be used.   Confidentiality and exclusions reviewed with pt who verbalized understanding.    Laboratory:  CBC Chemistry Profile GGT HbAIC UDS UA Tsh, T3/T4  Psychotherapy: Therapy: brief supportive therapy provided.  Discussed psychosocial stressors in detail.     Medications: If labs are WNL will call pt and start Lithium 300mg  po qHS for  mood stabalization and increase Celexa to 40mg  to target anxiety. Pt understands that treatment with antidepressant unopposed can trigger mania and is aware of what symptoms to monitor for. Continue Xanax 0.5mg  po qHS prn anxiety with plan to eventually discontinue.   Routine PRN Medications:  Yes  Consultations: encouraged pt to f/up with pcp as needed  Safety Concerns:  Pt endorsing SI without plan or intent and is at an acute low risk for suicide. Patient told to call clinic if any problems occur. Patient advised to go to ER  if she should develop SI/HI, side effects, or if symptoms worsen. Has crisis numbers to call if needed. Pt verbalized understanding.   Other:  F/up in 2 months or sooner if needed      Oletta DarterAGARWAL, SALINA, MD 6/18/20159:09 AM

## 2014-02-04 LAB — URINALYSIS
Bilirubin Urine: NEGATIVE
GLUCOSE, UA: NEGATIVE mg/dL
Hgb urine dipstick: NEGATIVE
Ketones, ur: NEGATIVE mg/dL
Leukocytes, UA: NEGATIVE
Nitrite: NEGATIVE
PH: 6 (ref 5.0–8.0)
PROTEIN: NEGATIVE mg/dL
Specific Gravity, Urine: 1.015 (ref 1.005–1.030)
Urobilinogen, UA: 0.2 mg/dL (ref 0.0–1.0)

## 2014-02-04 LAB — DRUG SCREEN, URINE
AMPHETAMINE SCRN UR: NEGATIVE
BARBITURATE QUANT UR: NEGATIVE
BENZODIAZEPINES.: POSITIVE — AB
Cocaine Metabolites: NEGATIVE
Creatinine,U: 147.09 mg/dL
MARIJUANA METABOLITE: NEGATIVE
Methadone: NEGATIVE
Opiates: NEGATIVE
Phencyclidine (PCP): NEGATIVE
Propoxyphene: NEGATIVE

## 2014-02-08 ENCOUNTER — Telehealth (HOSPITAL_COMMUNITY): Payer: Self-pay | Admitting: *Deleted

## 2014-02-08 DIAGNOSIS — F3181 Bipolar II disorder: Secondary | ICD-10-CM

## 2014-02-08 MED ORDER — LITHIUM CARBONATE 300 MG PO TABS: 300.0000 mg | ORAL_TABLET | Freq: Every day | ORAL | Status: DC

## 2014-02-08 MED ORDER — CITALOPRAM HYDROBROMIDE 20 MG PO TABS: 40.0000 mg | ORAL_TABLET | Freq: Every day | ORAL | Status: DC

## 2014-02-08 NOTE — Addendum Note (Signed)
Addended by: Oletta DarterAGARWAL, Alayja Armas on: 02/08/2014 04:37 PM   Modules accepted: Orders

## 2014-02-08 NOTE — Telephone Encounter (Signed)
Patient left VM:Was to start medicine based on lab results. Has completed labs. Wants to know when MD wants to start him on medication she talked about.

## 2014-02-08 NOTE — Telephone Encounter (Signed)
Spoke with pt. Labs are WNL. Will start Lithium 300mg  po qD and increase Celexa to 40mg  po qD. Pt is in agreement and has given verbal consent for treatment.

## 2014-02-09 ENCOUNTER — Other Ambulatory Visit (HOSPITAL_COMMUNITY): Payer: Self-pay | Admitting: *Deleted

## 2014-02-09 DIAGNOSIS — F3189 Other bipolar disorder: Secondary | ICD-10-CM

## 2014-02-09 MED ORDER — CITALOPRAM HYDROBROMIDE 40 MG PO TABS: 40.0000 mg | ORAL_TABLET | Freq: Every day | ORAL | Status: DC

## 2014-02-09 NOTE — Telephone Encounter (Signed)
Received faxed notification that Celexa 20 mg 2 tablets daily requires Prior Auth. Per Dr. Nilsa NuttingAgharwal, can change RX to Celexa 40 mg tablet daily.   Contacted pharmacy 6/24 AM to notify that new RX will be sent instead of applying for Prior Authorization. Pharmacist states they will delete RX for Celexa 20 mg 2/day. Patient contacted by MD and told to increase his Celexa from one 20 mg tablet/day to two 20 mg tablet/day. New Rx will not require this. Asked them to flag RX-increased dose, take ONE daily.

## 2014-02-10 ENCOUNTER — Telehealth (HOSPITAL_COMMUNITY): Payer: Self-pay | Admitting: *Deleted

## 2014-02-10 NOTE — Telephone Encounter (Signed)
Patient left RC:[id number]:Left message wants to speak with RN or MD. Phoned patient - left message to call office at his convenience. Patient called: patient has concern regarding when he went to pick up Celexa-insurnace did not cover? Original RX was for 20 mg take 2 daily. Insurance would only pay for 1 tablet - RX changed to Celexa 40 mg take 1 daily. Patient thanked this Clinical research associatewriter. States he will contact pharmacy to pick up new RX

## 2014-02-17 ENCOUNTER — Telehealth (HOSPITAL_COMMUNITY): Payer: Self-pay | Admitting: *Deleted

## 2014-02-17 DIAGNOSIS — F3181 Bipolar II disorder: Secondary | ICD-10-CM

## 2014-02-17 DIAGNOSIS — F410 Panic disorder [episodic paroxysmal anxiety] without agoraphobia: Secondary | ICD-10-CM

## 2014-02-17 MED ORDER — MIRTAZAPINE 15 MG PO TABS: 15.0000 mg | ORAL_TABLET | Freq: Every day | ORAL | Status: DC

## 2014-02-17 NOTE — Telephone Encounter (Signed)
Patient left VM:Has question about medication. Please call.  Phoned patient: Problems since increasing Celexa to 40 mg. States he had inconsistent erections when he was on Celexa 20 mg. Now on Celexa 40 mg, he has complete erectile dysfunction. Would like to discuss medication options with provider

## 2014-02-17 NOTE — Addendum Note (Signed)
Addended by: Oletta DarterAGARWAL, Margues Filippini on: 02/17/2014 05:20 PM   Modules accepted: Orders, Medications

## 2014-02-17 NOTE — Telephone Encounter (Signed)
Called and left a voice message for pt to call back to discuss his concerns.

## 2014-02-17 NOTE — Telephone Encounter (Signed)
Pt reports mood has improved but he is having ED issues- no longer able to get an erection. States on lower dose he was having difficulty ejaculating but now its worse. Pt has been calmer on Lithium and increased dose of Celexa.   A/P: Bipolar II- depressed, Panic disorder 1. Continue Lithium 300mg  po qHS 2. Discontinue Celexa due to sexual SE 3. Start trial of Remeron 15mg  po qHS for panic  4. Discontinue Xanax

## 2014-02-22 ENCOUNTER — Telehealth (HOSPITAL_COMMUNITY): Payer: Self-pay | Admitting: *Deleted

## 2014-02-22 DIAGNOSIS — F410 Panic disorder [episodic paroxysmal anxiety] without agoraphobia: Secondary | ICD-10-CM

## 2014-02-22 MED ORDER — ALPRAZOLAM 0.5 MG PO TABS: 0.2500 mg | ORAL_TABLET | Freq: Every evening | ORAL | Status: DC | PRN

## 2014-02-22 MED ORDER — CITALOPRAM HYDROBROMIDE 40 MG PO TABS: 40.0000 mg | ORAL_TABLET | Freq: Every day | ORAL | Status: DC

## 2014-02-22 NOTE — Telephone Encounter (Signed)
Pt states he is not sleeping since starting Remeron. States he feels like a zombie. He is not sleeping much at night. 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 He would like to stop it and restart Celexa. He was taking Xanax at night.    A/P: Bipolar II- depressed, Panic disorder  1. Continue Lithium 300mg  po qHS  2. Restart Celexa 40mg  qD for mood and panic 3. Discontinue Remeron   4. Restart Xanax 0.5mg  qD prn anxiety

## 2014-02-22 NOTE — Telephone Encounter (Signed)
Patient left UE:[id number]:Medicine change didn't help. Still feels bad and the other situation did not improve. Wants to discuss going back to previous medicines with Dr. Alena BillsAgrawal.

## 2014-02-22 NOTE — Addendum Note (Signed)
Addended by: Oletta DarterAGARWAL, Kerby Borner on: 02/22/2014 01:24 PM   Modules accepted: Orders, Medications

## 2014-04-05 ENCOUNTER — Ambulatory Visit (INDEPENDENT_AMBULATORY_CARE_PROVIDER_SITE_OTHER): Payer: 59 | Admitting: Psychiatry

## 2014-04-05 ENCOUNTER — Encounter (HOSPITAL_COMMUNITY): Payer: Self-pay | Admitting: Psychiatry

## 2014-04-05 ENCOUNTER — Ambulatory Visit (HOSPITAL_COMMUNITY): Payer: Self-pay | Admitting: Psychiatry

## 2014-04-05 VITALS — BP 119/77 | HR 93 | Ht 77.0 in | Wt 251.6 lb

## 2014-04-05 DIAGNOSIS — F3181 Bipolar II disorder: Secondary | ICD-10-CM

## 2014-04-05 DIAGNOSIS — F410 Panic disorder [episodic paroxysmal anxiety] without agoraphobia: Secondary | ICD-10-CM

## 2014-04-05 DIAGNOSIS — F3189 Other bipolar disorder: Secondary | ICD-10-CM

## 2014-04-05 MED ORDER — CITALOPRAM HYDROBROMIDE 40 MG PO TABS: 40.0000 mg | ORAL_TABLET | Freq: Every day | ORAL | Status: DC

## 2014-04-05 MED ORDER — ALPRAZOLAM 0.5 MG PO TABS: 0.5000 mg | ORAL_TABLET | Freq: Every evening | ORAL | Status: DC | PRN

## 2014-04-05 MED ORDER — LITHIUM CARBONATE 300 MG PO TABS: 450.0000 mg | ORAL_TABLET | Freq: Every day | ORAL | Status: DC

## 2014-04-05 NOTE — Progress Notes (Signed)
[facility] [id number] Progress Note  Matthew [name] [id number] 43 y.o.  04/05/2014 10:46 AM  Chief Complaint: "ok"  History of Present Illness: Depression and irritability are improved. 3 weeks ago he was depressed for 3 days and again for 1 day last week. During this time he has poor concentration and difficulty processing information. He isolates himself, has poor motivation and sad mood.  He has ups and downs but not as bad. When he is up his mood is elevated and he is more energetic and sleeping only 4 hrs. During this time he is more goal directed.  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 He was not financially extravagant or impulsive. He was able to control his behavior. This happened 3 times since last visit each episode lasted about 5 days. He and wife noticed that his behavior was more in control than it has been in the past.  Most of the time his emotions are well controlled. He feels "flat" as compared to when he is hypomanic, but not overwhelmed or depressed. States he is more interactive and laughing with others.  Anxiety is mostly controlled but when he is overwhelmed it spirals into depression. Denies panic attacks. States work performance is better on Celexa.   He is taking Celexa and Lithium as prescribed and he is having sexual SE and loose stools. Loose stools are less frequent and he reports it is not diarrhea. He is not concerned about this problem. Pt has gained 9 lbs since his last visit. Remeron was causing excess daytime sedation.   Overall he and wife both think he is doing better. Wife tells him ups and downs are improving.   Suicidal Ideation: No Plan Formed: No Patient has means to carry out plan: No  Homicidal Ideation: No Plan Formed: No Patient has means to carry out plan: No  Review of Systems: Psychiatric: Agitation: No Hallucination: No Depressed Mood: Yes Insomnia: No Sleeping abut 6 hrs/night with Xanax Hypersomnia: No Altered  Concentration: No Feels Worthless: No Grandiose Ideas: No Belief In Special Powers: No New/Increased Substance Abuse: No Compulsions: No  Neurologic: Headache: No Seizure: No Paresthesias: No  Past Medical Family, Social History: Lives in [place] with his wife and 4 kids. 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 Pt reports that he has been smoking Cigarettes.  He has a 20 pack-year smoking history. He has never used smokeless tobacco. He reports that he drinks alcohol. He reports that he does not use illicit drugs.  Family History  Problem Relation Age of Onset  . Hypertension Other   . Cancer Neg Hx   . Diabetes Neg Hx   . Heart disease Neg Hx   . Hyperlipidemia Neg Hx   . Stroke Neg Hx   . Suicidality Neg Hx   . Anxiety disorder Mother   . Alcohol abuse Father   . Drug abuse Father   . Bipolar disorder Sister   . Alcohol abuse Brother   . Drug abuse Brother   . Bipolar disorder Paternal Aunt   . Schizophrenia Paternal Aunt    Past Medical History  Diagnosis Date  . Low back pain   . Depression   . Anxiety   . Acute kidney failure 01/2012  . COPD (chronic obstructive pulmonary disease)     Outpatient Encounter Prescriptions as of 04/05/2014  Medication Sig  . ALPRAZolam (XANAX) 0.5 MG tablet Take 0.5 tablets (0.25 mg total) by  mouth at bedtime as needed for anxiety or sleep.  . citalopram (CELEXA) 40 MG tablet Take 1 tablet (40 mg total) by mouth daily.  . cyclobenzaprine (FLEXERIL) 10 MG tablet Take 1 tablet (10 mg total) by mouth 3 (three) times daily as needed.  . gabapentin (NEURONTIN) 300 MG capsule Take 300 mg by mouth daily as needed.  . lithium 300 MG tablet Take 1 tablet (300 mg total) by mouth daily.  . meloxicam (MOBIC) 7.5 MG tablet Take 7.5 mg by mouth daily as needed for pain.    Past Psychiatric History/Hospitalization(s): Anxiety: Yes Bipolar Disorder: No Depression: Yes Mania: No Psychosis:  No Schizophrenia: No Personality Disorder: No Hospitalization for psychiatric illness: No History of Electroconvulsive Shock Therapy: No Prior Suicide Attempts: No  Physical Exam: Constitutional:  BP 119/77  Pulse 93  Ht 6\' 5"  (1.956 m)  Wt 251 lb 9.6 oz (114.125 kg)  BMI 29.83 kg/m2  General Appearance: alert, oriented, no acute distress and well nourished  Musculoskeletal: Strength & Muscle Tone: within normal limits Gait & Station: normal Patient leans: N/A  Mental Status Examination/Evaluation: Objective: Attitude: Calm and cooperative  Appearance: Well Groomed, appears to be stated age  Eye Contact::  Good  Speech:  Clear and Coherent and Normal Rate  Volume:  Normal  Mood:  euthymic  Affect:  Constricted  Thought Process:  Goal Directed, Linear and Logical  Orientation:  Full (Time, Place, and Person)  Thought Content:  Negative  Suicidal Thoughts:  No  Homicidal Thoughts:  No  Judgement:  Fair  Insight:  Fair  Concentration: good  Memory: Immediate-fair Recent-fair Remote-fair  Recall: fair  Language: fair  Gait and Station: normal  [employer] of Knowledge: average  Psychomotor Activity:  Normal  Akathisia:  No  Handed:  Right  AIMS (if indicated):  n/a     Medical Decision Making (Choose Three): Established Problem, Stable/Improving (1), Review of Psycho-Social Stressors (1), Review or order clinical lab tests (1), Review of Medication Regimen & Side Effects (2) and Review of New Medication or Change in Dosage (2)  Assessment: AXIS I  Bipolar II current episode depressed, Panic disorder   AXIS II  Deferred   AXIS III  Past Medical History    Diagnosis  Date    .  Low back pain     .  Depression     .  Anxiety     .  Acute kidney failure  01/2012    .  COPD (chronic obstructive pulmonary disease)    AXIS IV  educational problems, occupational problems and problems with primary support group   AXIS V  51-60 moderate symptoms      Treatment  Plan/Recommendations:  Plan of Care:  Medication management with supportive therapy. Risks/benefits and SE of the medication discussed. Pt verbalized understanding and verbal consent obtained for treatment.  Affirm with the patient that the medications are taken as ordered. Patient expressed understanding of how their medications were to be used.  -improvement of symptoms    Laboratory: no new labs at this time. Reviewed 02/03/14 labs with pt: UDS + for benzos (prescribed), all other labs WNL  Psychotherapy: Therapy: brief supportive therapy provided. Discussed psychosocial stressors in detail.  - monitoring weight and encouraged healthy diet  Medications: Increase Lithium to 450mg  po qHS for mood stabalization  Continue Celexa to 40mg  qD to target anxiety. Pt understands that treatment with antidepressant unopposed can trigger mania and is aware of what symptoms to monitor for.  Continue Xanax 0.5mg  po qHS prn anxiety with plan to eventually discontinue.   Routine PRN Medications: Yes   Consultations: encouraged pt to f/up with pcp as needed  Declined therapy referral  Safety Concerns: Pt endorsing SI without plan or intent and is at an acute low risk for suicide. Patient told to call clinic if any problems occur. Patient advised to go to ER if she should develop SI/HI, side effects, or if symptoms worsen. Has crisis numbers to call if needed. Pt verbalized understanding.   Other: F/up in 3 months or sooner if needed     Oletta DarterAGARWAL, Earnest Mcgillis, MD 04/05/2014

## 2014-05-19 ENCOUNTER — Other Ambulatory Visit (HOSPITAL_COMMUNITY): Payer: Self-pay | Admitting: Psychiatry

## 2014-05-25 ENCOUNTER — Telehealth (HOSPITAL_COMMUNITY): Payer: Self-pay

## 2014-05-26 ENCOUNTER — Other Ambulatory Visit (HOSPITAL_COMMUNITY): Payer: Self-pay | Admitting: Psychiatry

## 2014-05-26 DIAGNOSIS — F410 Panic disorder [episodic paroxysmal anxiety] without agoraphobia: Secondary | ICD-10-CM

## 2014-05-26 MED ORDER — CITALOPRAM HYDROBROMIDE 40 MG PO TABS: 40.0000 mg | ORAL_TABLET | Freq: Every day | ORAL | Status: DC

## 2014-05-26 NOTE — Telephone Encounter (Signed)
Have sent e-script for 30 day refill of Celexa

## 2014-06-28 ENCOUNTER — Other Ambulatory Visit (HOSPITAL_COMMUNITY): Payer: Self-pay | Admitting: Psychiatry

## 2014-07-07 ENCOUNTER — Encounter (HOSPITAL_COMMUNITY): Payer: Self-pay | Admitting: Psychiatry

## 2014-07-07 ENCOUNTER — Ambulatory Visit (INDEPENDENT_AMBULATORY_CARE_PROVIDER_SITE_OTHER): Payer: 59 | Admitting: Psychiatry

## 2014-07-07 VITALS — BP 120/76 | HR 106 | Ht 77.0 in | Wt 250.0 lb

## 2014-07-07 DIAGNOSIS — F410 Panic disorder [episodic paroxysmal anxiety] without agoraphobia: Secondary | ICD-10-CM | POA: Insufficient documentation

## 2014-07-07 DIAGNOSIS — F3181 Bipolar II disorder: Secondary | ICD-10-CM | POA: Insufficient documentation

## 2014-07-07 MED ORDER — CITALOPRAM HYDROBROMIDE 40 MG PO TABS: 40.0000 mg | ORAL_TABLET | Freq: Every day | ORAL | Status: DC

## 2014-07-07 MED ORDER — LITHIUM CARBONATE 300 MG PO TABS: 600.0000 mg | ORAL_TABLET | Freq: Every day | ORAL | Status: DC

## 2014-07-07 NOTE — Progress Notes (Signed)
Patient ID: Matthew [name], male   DOB: 1971/04/12, 43 y.o.   MRN: [id number]  [facility] [id number] Progress Note  Meshach Perry [id number] 43 y.o.  07/07/2014 8:40 AM  Chief Complaint: "alright"  History of Present Illness: Reports he felt depressed for 2 weeks ago with low energy and poor focus. Stated it happened about 3 weeks ago. He was having passive thoughts of death during this time but no SI/HI. Symptoms resolved on its own. Reports a hx of a seasonal component to his depression.   Since then depression has resolved. No longer sad, good energy and concentration. Sleeping on average 5-6 hrs per night.    Wife tells him he is a little manic- more interactive, more jokes, more goal directed, more verbally agressive. Denies mood lability, irritability and impulsivity. States manic episodes last about 3-5 days. Appetite is normal.   He had a few episodes of random high anxiety with some chest pain. States it came out of no where. He is not sure if it was a panic attack.   He is taking Lithium, Celexa and Xanax as prescribed.  Sexual SE have resolved. States memory is not as good- hard time remembering names or things that happened at meeting.   Suicidal Ideation: No Plan Formed: No Patient has means to carry out plan: No  Homicidal Ideation: No Plan Formed: No Patient has means to carry out plan: No  Review of Systems: Psychiatric: Agitation: No Hallucination: No Depressed Mood: Yes Insomnia: No Sleeping about 5-6 hrs/night with Xanax Hypersomnia: No Altered Concentration: Yes Feels Worthless: No only when depressed Grandiose Ideas: Yes when manic Belief In Special Powers: No New/Increased Substance Abuse: No Compulsions: No  Neurologic: Headache: No Seizure: No Paresthesias: No   Review of Systems  Constitutional: Negative for fever, chills and weight loss.  HENT: Negative for congestion, nosebleeds and sore throat.   Eyes: Negative for blurred  vision, double vision and redness.  Respiratory: Negative for cough, sputum production and shortness of breath.   Cardiovascular: Negative for chest pain, palpitations and leg swelling.  Gastrointestinal: Negative for heartburn, nausea, vomiting, abdominal pain, diarrhea and constipation.  Musculoskeletal: Negative for back pain, joint pain and neck pain.  Skin: Negative for itching and rash.  Neurological: Negative for dizziness, tingling, tremors, seizures, weakness and headaches.  Psychiatric/Behavioral: Positive for depression. Negative for suicidal ideas, hallucinations and substance abuse. The patient is nervous/anxious. The patient does not have insomnia.      Past Medical Family, Social History: Lives in [place] with his wife and 4 kids. 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 Pt reports that he has been smoking Cigarettes.  He has a 20 pack-year smoking history. He has never used smokeless tobacco. He reports that he drinks alcohol. He reports that he does not use illicit drugs.  Family History  Problem Relation Age of Onset  . Hypertension Other   . Cancer Neg Hx   . Diabetes Neg Hx   . Heart disease Neg Hx   . Hyperlipidemia Neg Hx   . Stroke Neg Hx   . Suicidality Neg Hx   . Anxiety disorder Mother   . Alcohol abuse Father   . Drug abuse Father   . Bipolar disorder Sister   . Alcohol abuse Brother   . Drug abuse Brother   . Bipolar disorder Paternal Aunt   . Schizophrenia Paternal Aunt    Past Medical History  Diagnosis Date  .  Low back pain   . Depression   . Anxiety   . Acute kidney failure 01/2012  . COPD (chronic obstructive pulmonary disease)     Outpatient Encounter Prescriptions as of 07/07/2014  Medication Sig  . ALPRAZolam (XANAX) 0.5 MG tablet TAKE 1 TABLET BY MOUTH EVERY NIGHT AT BEDTIME AS NEEDED FOR INSOMNIA/ANXIETY  . citalopram (CELEXA) 40 MG tablet TAKE 1 TABLET BY MOUTH DAILY.  . cyclobenzaprine  (FLEXERIL) 10 MG tablet Take 1 tablet (10 mg total) by mouth 3 (three) times daily as needed.  . gabapentin (NEURONTIN) 300 MG capsule Take 300 mg by mouth daily as needed.  . lithium 300 MG tablet Take 1.5 tablets (450 mg total) by mouth daily.  . meloxicam (MOBIC) 7.5 MG tablet Take 7.5 mg by mouth daily as needed for pain.    Past Psychiatric History/Hospitalization(s): Anxiety: Yes Bipolar Disorder: No Depression: Yes Mania: No Psychosis: No Schizophrenia: No Personality Disorder: No Hospitalization for psychiatric illness: No History of Electroconvulsive Shock Therapy: No Prior Suicide Attempts: No  Physical Exam: Constitutional:  BP 120/76 mmHg  Pulse 106  Ht 6\' 5"  (1.956 m)  Wt 250 lb (113.399 kg)  BMI 29.64 kg/m2  General Appearance: alert, oriented, no acute distress and well nourished  Musculoskeletal: Strength & Muscle Tone: within normal limits Gait & Station: normal Patient leans: N/A  Mental Status Examination/Evaluation: Objective: Attitude: Calm and cooperative  Appearance: Well Groomed, appears to be stated age  Eye Contact::  Good  Speech:  Clear and Coherent and Normal Rate  Volume:  Normal  Mood:  euthymic  Affect:  Constricted  Thought Process:  Goal Directed, Linear and Logical  Orientation:  Full (Time, Place, and Person)  Thought Content:  Negative  Suicidal Thoughts:  No  Homicidal Thoughts:  No  Judgement:  Fair  Insight:  Fair  Concentration: good  Memory: Immediate-fair Recent-fair Remote-fair  Recall: fair  Language: fair  Gait and Station: normal  [employer] of Knowledge: average  Psychomotor Activity:  Normal  Akathisia:  No  Handed:  Right  AIMS (if indicated):  n/a     Medical Decision Making (Choose Three): Established Problem, Stable/Improving (1), Review of Psycho-Social Stressors (1), Review of Medication Regimen & Side Effects (2) and Review of New Medication or Change in Dosage (2)  Assessment: AXIS I  Bipolar  II current episode hypomanic, 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  Low back pain     .  Depression     .  Anxiety     .  Acute kidney failure  01/2012    .  COPD (chronic obstructive pulmonary disease)    AXIS IV  educational problems, occupational problems and problems with primary support group   AXIS V  51-60 moderate symptoms      Treatment Plan/Recommendations:  Plan of Care:  Medication management with supportive therapy. Risks/benefits and SE of the medication discussed. Pt verbalized understanding and verbal consent obtained for treatment.  Affirm with the patient that the medications are taken as ordered. Patient expressed understanding of how their medications were to be used.  -improvement of symptoms    Laboratory: no new labs at this time. Reviewed 02/03/14 labs with pt: UDS + for benzos (prescribed), all other labs WNL; new labs at next visit  Psychotherapy: Therapy: brief supportive therapy provided. Discussed psychosocial stressors in detail.  - monitoring  weight and encouraged healthy diet  Medications: Increase Lithium to 600mg  po qHS for mood stabalization  Continue Celexa to 40mg  qD to target anxiety. Pt understands that treatment with antidepressant unopposed can trigger mania and is aware of what symptoms to monitor for.   D/c Xanax due to memory concerns.   Routine PRN Medications: no  Consultations: encouraged pt to f/up with pcp as needed  Declined therapy referral  Safety Concerns: Pt endorsing SI without plan or intent and is at an acute low risk for suicide. Patient told to call clinic if any problems occur. Patient advised to go to ER if she should develop SI/HI, side effects, or if symptoms worsen. Has crisis numbers to call if needed. Pt verbalized understanding.   Other: F/up in 2 months or sooner if needed     Oletta DarterAGARWAL, Patsy Varma, MD 07/07/2014

## 2014-08-02 ENCOUNTER — Ambulatory Visit (HOSPITAL_COMMUNITY): Payer: Self-pay | Admitting: Psychiatry

## 2014-09-08 ENCOUNTER — Encounter (HOSPITAL_COMMUNITY): Payer: Self-pay | Admitting: Psychiatry

## 2014-09-08 ENCOUNTER — Ambulatory Visit (INDEPENDENT_AMBULATORY_CARE_PROVIDER_SITE_OTHER): Payer: 59 | Admitting: Psychiatry

## 2014-09-08 VITALS — BP 130/86 | HR 94 | Ht 77.0 in | Wt 248.0 lb

## 2014-09-08 DIAGNOSIS — F410 Panic disorder [episodic paroxysmal anxiety] without agoraphobia: Secondary | ICD-10-CM

## 2014-09-08 DIAGNOSIS — Z79899 Other long term (current) drug therapy: Secondary | ICD-10-CM

## 2014-09-08 DIAGNOSIS — F3181 Bipolar II disorder: Secondary | ICD-10-CM

## 2014-09-08 LAB — COMPLETE METABOLIC PANEL WITH GFR
ALT: 34 U/L (ref 0–53)
AST: 18 U/L (ref 0–37)
Albumin: 4.5 g/dL (ref 3.5–5.2)
Alkaline Phosphatase: 93 U/L (ref 39–117)
BILIRUBIN TOTAL: 0.5 mg/dL (ref 0.2–1.2)
BUN: 8 mg/dL (ref 6–23)
CO2: 27 mEq/L (ref 19–32)
Calcium: 9.4 mg/dL (ref 8.4–10.5)
Chloride: 102 mEq/L (ref 96–112)
Creat: 0.92 mg/dL (ref 0.50–1.35)
GFR, Est African American: >89 mL/min
Glucose, Bld: 116 mg/dL — ABNORMAL HIGH (ref 70–99)
Potassium: 3.7 mEq/L (ref 3.5–5.3)
SODIUM: 138 meq/L (ref 135–145)
Total Protein: 7.2 g/dL (ref 6.0–8.3)

## 2014-09-08 LAB — TSH: TSH: 2.131 u[IU]/mL (ref 0.350–4.500)

## 2014-09-08 MED ORDER — CITALOPRAM HYDROBROMIDE 40 MG PO TABS: 40.0000 mg | ORAL_TABLET | Freq: Every day | ORAL | Status: DC

## 2014-09-08 MED ORDER — LITHIUM CARBONATE 300 MG PO TABS: 900.0000 mg | ORAL_TABLET | Freq: Every day | ORAL | Status: DC

## 2014-09-08 NOTE — Progress Notes (Signed)
[facility] [phone] Progress Note  Matthew [name] [id number] 44 y.o.  09/08/2014 8:41 AM  Chief Complaint: "pretty well"  History of Present Illness: About 4-6 weeks ago he was very depressed, having passive SI without plan or intent, low motivation and spending a lot of time in bed. Episode lasted for a couple of weeks. It resolved slowly on it's own.   Since then he is doing better and having 1-2 days a week of low level of sad mood (level 3/10). Reports low energy and low stress tolerance. He tries not to act on it and tries to distract himself. Sleep, appetite and energy are ok. Reports a hx of a seasonal component to his depression.   He got a promotion at work- more responsibility and more work. Overall performance is good. Home life is good.   For 2 weeks around Xmas he was having manic like symptoms. Wife told him he was very cheerful, more energetic, more interactive, more jokes, more goal directed, more verbally aggressive, grandiouse. Denies mood lability, irritability and impulsivity, excessive financial spending.   Today he is feeling "better than good" and can do anything.   He had a few episodes of random high anxiety with some chest pain. States it came out of no where. He is not sure if it was a panic attack.   He is taking Lithium, Celexa as prescribed.  Sexual SE have resolved.   Stopped Xanax at last appointment. States he didn't have any withdrawal. States no improvement in memory since stopping Xanax. He isn't forgetful but has trouble recalling details of conversations.   Suicidal Ideation: No Plan Formed: No Patient has means to carry out plan: No  Homicidal Ideation: No Plan Formed: No Patient has means to carry out plan: No  Review of Systems: Psychiatric: Agitation: No Hallucination: No Depressed Mood: Yes Insomnia: No  Hypersomnia: No Altered Concentration: No Feels Worthless: No only when depressed Grandiose Ideas: Yes when  manic Belief In Special Powers: No New/Increased Substance Abuse: No Compulsions: No  Neurologic: Headache: Yes Seizure: No Paresthesias: No   Review of Systems  Constitutional: Negative for fever, chills and weight loss.  HENT: Negative for congestion, nosebleeds and sore throat.   Eyes: Negative for blurred vision, double vision and redness.  Respiratory: Negative for cough, sputum production and shortness of breath.   Cardiovascular: Negative for chest pain, palpitations and leg swelling.  Gastrointestinal: Negative for heartburn, nausea, vomiting and abdominal pain.  Musculoskeletal: Positive for back pain. Negative for myalgias, joint pain and neck pain.  Skin: Negative for itching and rash.  Neurological: Positive for headaches. Negative for dizziness, seizures and loss of consciousness.  Psychiatric/Behavioral: Positive for depression. Negative for suicidal ideas, hallucinations, memory loss and substance abuse. The patient is nervous/anxious. The patient does not have insomnia.      Past Medical Family, Social History: Lives in [place] with his wife and 4 kids. 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 Pt reports that he has been smoking Cigarettes.  He has a 20 pack-year smoking history. He has never used smokeless tobacco. He reports that he drinks alcohol. He reports that he does not use illicit drugs.  Family History  Problem Relation Age of Onset  . Hypertension Other   . Cancer Neg Hx   . Diabetes Neg Hx   . Heart disease Neg Hx   . Hyperlipidemia Neg Hx   . Stroke Neg Hx   .  Suicidality Neg Hx   . Anxiety disorder Mother   . Alcohol abuse Father   . Drug abuse Father   . Bipolar disorder Sister   . Alcohol abuse Brother   . Drug abuse Brother   . Bipolar disorder Paternal Aunt   . Schizophrenia Paternal Aunt    Past Medical History  Diagnosis Date  . Low back pain   . Depression   . Anxiety   . Acute kidney  failure 01/2012  . COPD (chronic obstructive pulmonary disease)     Outpatient Encounter Prescriptions as of 09/08/2014  Medication Sig  . citalopram (CELEXA) 40 MG tablet Take 1 tablet (40 mg total) by mouth daily.  . cyclobenzaprine (FLEXERIL) 10 MG tablet Take 1 tablet (10 mg total) by mouth 3 (three) times daily as needed.  . gabapentin (NEURONTIN) 300 MG capsule Take 300 mg by mouth daily as needed.  . lithium 300 MG tablet Take 2 tablets (600 mg total) by mouth at bedtime.  . meloxicam (MOBIC) 7.5 MG tablet Take 7.5 mg by mouth daily as needed for pain.    Past Psychiatric History/Hospitalization(s): Anxiety: Yes Bipolar Disorder: No Depression: Yes Mania: No Psychosis: No Schizophrenia: No Personality Disorder: No Hospitalization for psychiatric illness: No History of Electroconvulsive Shock Therapy: No Prior Suicide Attempts: No  Physical Exam: Constitutional:  BP 130/86 mmHg  Pulse 94  Ht 6\' 5"  (1.956 m)  Wt 248 lb (112.492 kg)  BMI 29.40 kg/m2  General Appearance: alert, oriented, no acute distress and well nourished  Musculoskeletal: Strength & Muscle Tone: within normal limits Gait & Station: normal Patient leans: N/A  Mental Status Examination/Evaluation: Objective: Attitude: Calm and cooperative  Appearance: Well Groomed, appears to be stated age  Eye Contact::  Good  Speech:  Clear and Coherent and Normal Rate  Volume:  Normal  Mood:  euthymic  Affect:  Constricted  Thought Process:  Goal Directed, Linear and Logical  Orientation:  Full (Time, Place, and Person)  Thought Content:  Negative  Suicidal Thoughts:  No  Homicidal Thoughts:  No  Judgement:  Fair  Insight:  Fair  Concentration: good  Memory: Immediate-fair Recent-fair Remote-fair  Recall: fair  Language: fair  Gait and Station: normal  [employer] of Knowledge: average  Psychomotor Activity:  Normal  Akathisia:  No  Handed:  Right  AIMS (if indicated):  n/a     Medical  Decision Making (Choose Three): Review of Psycho-Social Stressors (1), Review or order clinical lab tests (1), Established Problem, Worsening (2), Review of Medication Regimen & Side Effects (2) and Review of New Medication or Change in Dosage (2)  Assessment: AXIS I  Bipolar II current episode hypomanic, 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  Low back pain     .  Depression     .  Anxiety     .  Acute kidney failure  01/2012    .  COPD (chronic obstructive pulmonary disease)    AXIS IV  educational problems, occupational problems and problems with primary support group   AXIS V  51-60 moderate symptoms      Treatment Plan/Recommendations:  Plan of Care:  Medication management with supportive therapy. Risks/benefits and SE of the medication discussed. Pt verbalized understanding and verbal consent obtained for treatment.  Affirm with the patient that the medications are taken as ordered. Patient expressed understanding of how  their medications were to be used.    Laboratory:  Lithium level, CMP with Bun/Creatinine, TSH, HIV at pt request   Psychotherapy: Therapy: brief supportive therapy provided. Discussed psychosocial stressors in detail.  - monitoring weight (lost 2 lbs since last visit) and encouraged healthy diet -recommended light box therapy   Medications: Increase Lithium to  po qHS for mood stabalization  Continue Celexa to  qD to target anxiety. Pt understands that treatment with antidepressant unopposed can trigger mania and is aware of what symptoms to monitor for.    Routine PRN Medications: no  Consultations: encouraged pt to f/up with pcp as needed  Declined therapy referral  Safety Concerns: Pt endorsing SI without plan or intent and is at an acute low risk for suicide. Patient told to call clinic if any problems occur. Patient advised to go to ER if she should develop SI/HI, side  effects, or if symptoms worsen. Has crisis numbers to call if needed. Pt verbalized understanding.   Other: F/up in 2 months or sooner if needed     [name], MD 09/08/2014

## 2014-09-09 LAB — LITHIUM LEVEL: Lithium Lvl: 0.3 mEq/L — ABNORMAL LOW (ref 0.80–1.40)

## 2014-09-12 LAB — HIV-1 RNA QUANT-NO REFLEX-BLD
HIV 1 RNA Quant: <20 copies/mL (ref <20)
HIV-1 RNA Quant, Log: <1.3 {Log} (ref <1.30)

## 2014-12-08 ENCOUNTER — Encounter (HOSPITAL_COMMUNITY): Payer: Self-pay | Admitting: Psychiatry

## 2014-12-08 ENCOUNTER — Ambulatory Visit (INDEPENDENT_AMBULATORY_CARE_PROVIDER_SITE_OTHER): Payer: 59 | Admitting: Psychiatry

## 2014-12-08 VITALS — BP 112/84 | HR 84 | Ht 77.0 in | Wt 248.6 lb

## 2014-12-08 DIAGNOSIS — F410 Panic disorder [episodic paroxysmal anxiety] without agoraphobia: Secondary | ICD-10-CM | POA: Diagnosis not present

## 2014-12-08 DIAGNOSIS — F3181 Bipolar II disorder: Secondary | ICD-10-CM

## 2014-12-08 MED ORDER — CITALOPRAM HYDROBROMIDE 40 MG PO TABS: 40.0000 mg | ORAL_TABLET | Freq: Every day | ORAL | Status: DC

## 2014-12-08 MED ORDER — LITHIUM CARBONATE ER 300 MG PO TBCR: 900.0000 mg | EXTENDED_RELEASE_TABLET | Freq: Every day | ORAL | Status: DC

## 2014-12-08 NOTE — Progress Notes (Signed)
[facility] [id number] Progress Note  Matthew KidneyRobert L Bastos [name] [id number] 44 y.o.  12/08/2014 8:35 AM  Chief Complaint: "pretty well"  History of Present Illness: Pt had the flu in March and skipped the meds for 3 days and was depressed. He restarted the meds and is better.  Depression is well controlled. He gets depressed maybe a few days a month. On those days he feels a little down, feels tired and has low motivation. Depression is not like it used to be. He feels increase in Lithium has helped a lot. Denies isolation, crying, anhedonia, worthlessness and hopelessness.   2-3 times a month for 3 days he feels up. No change in sleep pattern. Energy is a little up. He is not really impulsive. Pt reports he is more interactive and jovial.   Anxiety comes and goes. He has random high anxiety with some chest pain and jaw pain. States it came out of no where when stressed. He is not sure if it was a panic attack but thinks it was a mild one. Episodes are short and last for 5-10 min. It occurs 1-2x/week.   Sleep and appetite are good.   He is taking Lithium and Celexa as prescribed and reports possible SE of diarrhea.  Sexual SE have resolved. Pt is now taking Cialis for sexual SE and it it helping.   Suicidal Ideation: No Plan Formed: No Patient has means to carry out plan: No  Homicidal Ideation: No Plan Formed: No Patient has means to carry out plan: No  Review of Systems: Psychiatric: Agitation: No Hallucination: No Depressed Mood: No Insomnia: No  Hypersomnia: No Altered Concentration: No Feels Worthless: No only when depressed Grandiose Ideas: Yes when manic Belief In Special Powers: No New/Increased Substance Abuse: No Compulsions: No  Neurologic: Headache: Yes Seizure: No Paresthesias: No   Review of Systems  Constitutional: Negative for fever, chills and weight loss.  HENT: Negative for ear pain, hearing loss, sore throat and tinnitus.   Eyes: Negative for  blurred vision, double vision and pain.  Respiratory: Negative for cough, sputum production and shortness of breath.   Cardiovascular: Negative for chest pain, palpitations and leg swelling.  Gastrointestinal: Positive for heartburn and diarrhea. Negative for nausea and vomiting.  Musculoskeletal: Negative for back pain, joint pain and neck pain.  Skin: Negative for itching and rash.  Neurological: Negative for dizziness, sensory change, seizures, loss of consciousness and headaches.  Psychiatric/Behavioral: Negative for depression, suicidal ideas, hallucinations, memory loss and substance abuse. The patient is nervous/anxious. The patient does not have insomnia.      Past Medical Family, Social History: Lives in [place] with his wife and 4 kids. 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 Pt reports that he has been smoking Cigarettes.  He has a 20 pack-year smoking history. He has never used smokeless tobacco. He reports that he drinks alcohol. He reports that he does not use illicit drugs.  Family History  Problem Relation Age of Onset  . Hypertension Other   . Cancer Neg Hx   . Diabetes Neg Hx   . Heart disease Neg Hx   . Hyperlipidemia Neg Hx   . Stroke Neg Hx   . Suicidality Neg Hx   . Anxiety disorder Mother   . Alcohol abuse Father   . Drug abuse Father   . Bipolar disorder Sister   . Alcohol abuse Brother   . Drug abuse Brother   .  Bipolar disorder Paternal Aunt   . Schizophrenia Paternal Aunt    Past Medical History  Diagnosis Date  . Low back pain   . Depression   . Anxiety   . Acute [name] failure 01/2012  . COPD (chronic obstructive pulmonary disease)   . Bipolar disorder     hypomania    Outpatient Encounter Prescriptions as of 12/08/2014  Medication Sig  . baclofen (LIORESAL) 10 MG tablet Take 10 mg by mouth as needed for muscle spasms.  . celecoxib (CELEBREX) 100 MG capsule Take 100 mg by mouth as needed.  .  citalopram (CELEXA) 40 MG tablet Take 1 tablet (40 mg total) by mouth daily.  . cyclobenzaprine (FLEXERIL) 10 MG tablet Take 1 tablet (10 mg total) by mouth 3 (three) times daily as needed. (Patient not taking: Reported on 12/08/2014)  . gabapentin (NEURONTIN) 300 MG capsule Take 300 mg by mouth daily as needed.  . lithium 300 MG tablet Take 3 tablets (900 mg total) by mouth at bedtime.  . meloxicam (MOBIC) 7.5 MG tablet Take 7.5 mg by mouth daily as needed for pain.  . tadalafil (CIALIS) 5 MG tablet Take 5 mg by mouth daily as needed for erectile dysfunction.    Past Psychiatric History/Hospitalization(s): Anxiety: Yes Bipolar Disorder: No Depression: Yes Mania: No Psychosis: No Schizophrenia: No Personality Disorder: No Hospitalization for psychiatric illness: No History of Electroconvulsive Shock Therapy: No Prior Suicide Attempts: No  Physical Exam: Constitutional:  BP 112/84 mmHg  Pulse 84  Ht  (1.956 m)  Wt 248 lb 9.6 oz (112.764 kg)  BMI 29.47 kg/m2  General Appearance: alert, oriented, no acute distress and well nourished  Musculoskeletal: Strength & Muscle Tone: within normal limits Gait & Station: normal Patient leans: N/A  Mental Status Examination/Evaluation: Objective: Attitude: Calm and cooperative  Appearance: Well Groomed, appears to be stated age  Eye Contact::  Good  Speech:  Clear and Coherent and Normal Rate  Volume:  Normal  Mood:  euthymic  Affect:  Constricted  Thought Process:  Goal Directed, Linear and Logical  Orientation:  Full (Time, Place, and Person)  Thought Content:  Negative  Suicidal Thoughts:  No  Homicidal Thoughts:  No  Judgement:  Fair  Insight:  Fair  Concentration: good  Memory: Immediate-fair Recent-fair Remote-fair  Recall: fair  Language: fair  Gait and Station: normal  [employer] of Knowledge: average  Psychomotor Activity:  Normal  Akathisia:  No  Handed:  Right  AIMS (if indicated):  n/a     Medical  Decision Making (Choose Three): Established Problem, Stable/Improving (1), Review of Psycho-Social Stressors (1), Review or order clinical lab tests (1) and Review of Medication Regimen & Side Effects (2)  Assessment: AXIS I  Bipolar II current episode unspecified, Panic disorder without agorophobia  AXIS II  Deferred   AXIS [name]  Past Medical History    Diagnosis  Date    .  Low back pain     .  Depression     .  Anxiety     .  Acute [name] failure  01/2012    .  COPD (chronic obstructive pulmonary disease)    AXIS IV  educational problems, occupational problems and problems with primary support group   AXIS V  51-60 moderate symptoms      Treatment Plan/Recommendations:  Plan of Care:  Medication management with supportive therapy. Risks/benefits and SE of the medication discussed. Pt verbalized understanding and verbal consent obtained for treatment.  Affirm  with the patient that the medications are taken as ordered. Patient expressed understanding of how their medications were to be used.    Laboratory: reviewed labs 09/08/2014 Lithium level 0.3, CMP with Bun/Creatinine WNL, TSH WNL, HIV at pt request- WNL, glu 116 non fasting   Psychotherapy: Therapy: brief supportive therapy provided. Discussed psychosocial stressors in detail.  - monitoring weight (lost 2 lbs since last visit) and encouraged healthy diet -recommended light box therapy   Medications: will switch to Lithobid  po qHS for mood stabilization due to GI SE.  Continue Celexa to  qD to target anxiety. Pt understands that treatment with antidepressant unopposed can trigger mania and is aware of what symptoms to monitor for.    Routine PRN Medications: no  Consultations: encouraged pt to f/up with pcp as needed  Declined therapy referral  Safety Concerns: Pt denies SI and is at an acute low risk for suicide.Patient told to call clinic if any problems occur. Patient advised to go to ER if they should develop SI/HI,  side effects, or if symptoms worsen. Has crisis numbers to call if needed. Pt verbalized understanding.\  Other: F/up in 6 months or sooner if needed     [name], MD 12/08/2014

## 2015-06-13 ENCOUNTER — Ambulatory Visit (INDEPENDENT_AMBULATORY_CARE_PROVIDER_SITE_OTHER): Payer: 59 | Admitting: Psychiatry

## 2015-06-13 ENCOUNTER — Encounter (HOSPITAL_COMMUNITY): Payer: Self-pay | Admitting: Psychiatry

## 2015-06-13 VITALS — BP 126/84 | HR 96 | Ht 77.0 in | Wt 248.2 lb

## 2015-06-13 DIAGNOSIS — F3181 Bipolar II disorder: Secondary | ICD-10-CM

## 2015-06-13 DIAGNOSIS — Z79899 Other long term (current) drug therapy: Secondary | ICD-10-CM | POA: Diagnosis not present

## 2015-06-13 DIAGNOSIS — F410 Panic disorder [episodic paroxysmal anxiety] without agoraphobia: Secondary | ICD-10-CM | POA: Diagnosis not present

## 2015-06-13 MED ORDER — LORAZEPAM 1 MG PO TABS: 1.0000 mg | ORAL_TABLET | Freq: Every day | ORAL | Status: DC | PRN

## 2015-06-13 MED ORDER — CITALOPRAM HYDROBROMIDE 40 MG PO TABS: 40.0000 mg | ORAL_TABLET | Freq: Every day | ORAL | Status: DC

## 2015-06-13 MED ORDER — LITHIUM CARBONATE ER 300 MG PO TBCR: 900.0000 mg | EXTENDED_RELEASE_TABLET | Freq: Every day | ORAL | Status: DC

## 2015-06-13 NOTE — Progress Notes (Signed)
Patient ID: Matthew [name], male   DOB: 09/20/1970, 44 y.o.   MRN: [id number]  [facility] [id number] Progress Note  Matthew [name] [id number] 44 y.o.  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 States things at home are good.  Depression is well controlled. He gets depressed maybe a few days a month. On those days he feels a little down, feels tired and has low motivation and if extreme he may have passive thoughts of death.  Depression is not like it used to be. States it is usually related to anxiety and stressors. This weekend he had an episode due to work related stress.   He feels increase in Lithium has helped a lot. Denies isolation, crying, anhedonia, worthlessness and hopelessness.   2-3 times a month for 3 days he feels up. No change in sleep pattern. Energy is good. He is not really impulsive. Pt reports he is more interactive and jovial. Denies manic and hypomanic symptoms including periods of decreased need for sleep, increased energy, mood lability, impulsivity, FOI, and excessive spending recently.  Anxiety comes and goes. It happens every few weeks. With high anxiety he has some chest pain and headaches.  States it come out of no where when stressed. When really anxious it can lead to depression if she can't calm down. Denies panic attacks. He may be having tremors when anxious but he is not sure.   Sleep and appetite are good.   He is taking Lithium and Celexa as prescribed and reports possible SE of diarrhea.  Pharmacy gave him Lithium instead of Lithobid.   Wife thinks pt may have Asperger's. Pt reports hx of getting easily irritable and having few friends. Overall he is social at work and maintains healthy relationships at home. Pt took an online quiz and stated it showed he may have Asperger's but he is not too concerned.   Suicidal Ideation: No Plan Formed: No  Patient has means to carry out plan: No  Homicidal Ideation: No Plan Formed: No Patient has means to carry out plan: No  Review of Systems: Psychiatric: Agitation: No Hallucination: No Depressed Mood: No Insomnia: No  Hypersomnia: No Altered Concentration: No Feels Worthless: No only when depressed Grandiose Ideas: No  Belief In Special Powers: No New/Increased Substance Abuse: No Compulsions: No  Neurologic: Headache: Yes Seizure: No Paresthesias: No   Review of Systems  Constitutional: Negative for fever, chills and weight loss.  HENT: Negative for ear pain, hearing loss, sore throat and tinnitus.   Eyes: Negative for blurred vision, double vision and pain.  Respiratory: Negative for cough, sputum production and shortness of breath.   Cardiovascular: Negative for chest pain, palpitations and leg swelling.  Gastrointestinal: Positive for heartburn and diarrhea. Negative for nausea and vomiting.  Musculoskeletal: Negative for back pain, joint pain and neck pain.  Skin: Negative for itching and rash.  Neurological: Positive for tremors. Negative for dizziness, sensory change, seizures, loss of consciousness and headaches.  Psychiatric/Behavioral: Negative for depression, suicidal ideas, hallucinations, memory loss and substance abuse. The patient is not nervous/anxious and does not have insomnia.      Past Medical Family, Social History: Lives in [place] with his wife and 4 kids. 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 Pt reports that he has been smoking Cigarettes.  He has a 20 pack-year smoking  history. He has never used smokeless tobacco. He reports that he drinks alcohol. He reports that he does not use illicit drugs.  Family History  Problem Relation Age of Onset  . Hypertension Other   . Cancer Neg Hx   . Diabetes Neg Hx   . Heart disease Neg Hx   . Hyperlipidemia Neg Hx   . Stroke Neg Hx   . Suicidality Neg Hx   .  Anxiety disorder Mother   . Alcohol abuse Father   . Drug abuse Father   . Bipolar disorder Sister   . Alcohol abuse Brother   . Drug abuse Brother   . Bipolar disorder Paternal Aunt   . Schizophrenia Paternal Aunt    Past Medical History  Diagnosis Date  . Low back pain   . Depression   . Anxiety   . Acute kidney failure (HCC) 01/2012  . COPD (chronic obstructive pulmonary disease) (HCC)   . Bipolar disorder (HCC)     hypomania    Outpatient Encounter Prescriptions as of 06/13/2015  Medication Sig  . baclofen (LIORESAL) 10 MG tablet Take 10 mg by mouth as needed for muscle spasms.  . celecoxib (CELEBREX) 100 MG capsule Take 100 mg by mouth as needed.  . citalopram (CELEXA) 40 MG tablet Take 1 tablet (40 mg total) by mouth daily.  . cyclobenzaprine (FLEXERIL) 10 MG tablet Take 1 tablet (10 mg total) by mouth 3 (three) times daily as needed.  . gabapentin (NEURONTIN) 300 MG capsule Take 300 mg by mouth daily as needed.  . lithium carbonate (LITHOBID) 300 MG CR tablet Take 3 tablets (900 mg total) by mouth at bedtime.  . meloxicam (MOBIC) 7.5 MG tablet Take 7.5 mg by mouth daily as needed for pain.  . tadalafil (CIALIS) 5 MG tablet Take 5 mg by mouth daily as needed for erectile dysfunction.   No facility-administered encounter medications on file as of 06/13/2015.    Past Psychiatric History/Hospitalization(s): Anxiety: Yes Bipolar Disorder: No Depression: Yes Mania: No Psychosis: No Schizophrenia: No Personality Disorder: No Hospitalization for psychiatric illness: No History of Electroconvulsive Shock Therapy: No Prior Suicide Attempts: No  Physical Exam: Constitutional:  BP 126/84 mmHg  Pulse 96  Ht 6\' 5"  (1.956 m)  Wt 248 lb 3.2 oz (112.583 kg)  BMI 29.43 kg/m2  General Appearance: alert, oriented, no acute distress and well nourished  Musculoskeletal: Strength & Muscle Tone: within normal limits Gait & Station: normal Patient leans: N/A  Mental Status  Examination/Evaluation: Objective: Attitude: Calm and cooperative  Appearance: Well Groomed, appears to be stated age  Eye Contact::  Good  Speech:  Clear and Coherent and Normal Rate  Volume:  Normal  Mood:  euthymic  Affect:  Constricted  Thought Process:  Goal Directed, Linear and Logical  Orientation:  Full (Time, Place, and Person)  Thought Content:  Negative  Suicidal Thoughts:  No  Homicidal Thoughts:  No  Judgement:  Fair  Insight:  Fair  Concentration: good  Memory: Immediate-fair Recent-fair Remote-fair  Recall: fair  Language: fair  Gait and Station: normal  [employer] of Knowledge: average  Psychomotor Activity:  Normal  Akathisia:  No  Handed:  Right  AIMS (if indicated):  n/a     Medical Decision Making (Choose Three): Established Problem, Stable/Improving (1), Review of Psycho-Social Stressors (1), Review or order clinical lab tests (1) and Review of Medication Regimen & Side Effects (2)  Assessment: AXIS I  Bipolar II current episode unspecified, Panic disorder without  agorophobia  AXIS II  Deferred   AXIS [name]  Past Medical History    Diagnosis  Date    .  Low back pain     .  Depression     .  Anxiety     .  Acute kidney failure  01/2012    .  COPD (chronic obstructive pulmonary disease)    AXIS IV  educational problems, occupational problems and problems with primary support group   AXIS V  51-60 moderate symptoms      Treatment Plan/Recommendations:  Plan of Care:  Medication management with supportive therapy. Risks/benefits and SE of the medication discussed. Pt verbalized understanding and verbal consent obtained for treatment.  Affirm with the patient that the medications are taken as ordered. Patient expressed understanding of how their medications were to be used.    Laboratory: Lithium level, CMP with Bun/Creatinine, TSH, CBC     Psychotherapy: Therapy: brief supportive therapy provided. Discussed psychosocial stressors in detail.  -  monitoring weight (lno changed since last visit) and encouraged healthy diet -recommended light box therapy - discussed Asperger's. Pt may be on the spectrum but overall is maintaining healthy relationships. At this time no therapy is recommended as his quality of life is fine.   Medications: will switch to Lithobid  po qHS for mood stabilization due to GI SE.  Continue Celexa to  qD to target anxiety. Pt understands that treatment with antidepressant unopposed can trigger mania and is aware of what symptoms to monitor for.   -start trial of Ativan  po qD prn anxiety- will give 15 tabs  Routine PRN Medications: yes  Consultations: encouraged pt to f/up with pcp as needed  Declined therapy referral  Safety Concerns: Pt denies SI and is at an acute low risk for suicide.Patient told to call clinic if any problems occur. Patient advised to go to ER if they should develop SI/HI, side effects, or if symptoms worsen. Has crisis numbers to call if needed. Pt verbalized understanding.\  Other: F/up in 2 months or sooner if needed     [name], MD 06/13/2015

## 2015-08-11 LAB — COMPREHENSIVE METABOLIC PANEL
ALBUMIN: 4.6 g/dL (ref 3.6–5.1)
ALT: 25 U/L (ref 9–46)
AST: 15 U/L (ref 10–40)
Alkaline Phosphatase: 79 U/L (ref 40–115)
BUN: 16 mg/dL (ref 7–25)
CALCIUM: 9.8 mg/dL (ref 8.6–10.3)
CHLORIDE: 105 mmol/L (ref 98–110)
CO2: 27 mmol/L (ref 20–31)
Creat: 1.05 mg/dL (ref 0.60–1.35)
Glucose, Bld: 85 mg/dL (ref 65–99)
Potassium: 4.5 mmol/L (ref 3.5–5.3)
SODIUM: 141 mmol/L (ref 135–146)
Total Bilirubin: 0.5 mg/dL (ref 0.2–1.2)
Total Protein: 7 g/dL (ref 6.1–8.1)

## 2015-08-11 LAB — CBC
HEMATOCRIT: 46.3 % (ref 39.0–52.0)
Hemoglobin: 16.1 g/dL (ref 13.0–17.0)
MCH: 31.2 pg (ref 26.0–34.0)
MCHC: 34.8 g/dL (ref 30.0–36.0)
MCV: 89.7 fL (ref 78.0–100.0)
MPV: 10.8 fL (ref 8.6–12.4)
PLATELETS: 217 10*3/uL (ref 150–400)
RBC: 5.16 MIL/uL (ref 4.22–5.81)
RDW: 13.9 % (ref 11.5–15.5)
WBC: 8.3 10*3/uL (ref 4.0–10.5)

## 2015-08-11 LAB — TSH: TSH: 1.156 u[IU]/mL (ref 0.350–4.500)

## 2015-08-12 LAB — LITHIUM LEVEL: Lithium Lvl: 0.4 mEq/L — ABNORMAL LOW (ref 0.80–1.40)

## 2015-08-15 ENCOUNTER — Encounter (HOSPITAL_COMMUNITY): Payer: Self-pay | Admitting: Psychiatry

## 2015-08-15 ENCOUNTER — Ambulatory Visit (INDEPENDENT_AMBULATORY_CARE_PROVIDER_SITE_OTHER): Payer: 59 | Admitting: Psychiatry

## 2015-08-15 VITALS — BP 128/84 | HR 91 | Ht 77.0 in | Wt 252.0 lb

## 2015-08-15 DIAGNOSIS — F3181 Bipolar II disorder: Secondary | ICD-10-CM | POA: Diagnosis not present

## 2015-08-15 DIAGNOSIS — F410 Panic disorder [episodic paroxysmal anxiety] without agoraphobia: Secondary | ICD-10-CM | POA: Diagnosis not present

## 2015-08-15 MED ORDER — LITHIUM CARBONATE ER 300 MG PO TBCR: 900.0000 mg | EXTENDED_RELEASE_TABLET | Freq: Every day | ORAL | Status: DC

## 2015-08-15 MED ORDER — CITALOPRAM HYDROBROMIDE 40 MG PO TABS: 40.0000 mg | ORAL_TABLET | Freq: Every day | ORAL | Status: DC

## 2015-08-15 NOTE — Progress Notes (Signed)
[facility] [id number] Progress Note  Matthew [name] [id number] 44 y.o.  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 States things at home are good.  Depression is well controlled. He gets depressed maybe a few days a month. On those days he feels a little down, irritative, a little isolative,  feels tired and has low motivation and if extreme he may have passive thoughts of death.  Depression is not like it used to be. States it is usually related to anxiety and stressors. He has a few episodes since his last visit.  Denies isolation, crying, anhedonia, worthlessness and hopelessness.   2-3 times a month for 5 days he feels up. No change in sleep pattern. Energy is up. He is not really impulsive. Pt reports he is more interactive and jovial. Denies manic and hypomanic symptoms including periods of decreased need for sleep, increased energy, mood lability, impulsivity, FOI, and excessive spending recently.  Anxiety comes and goes. It happens every few weeks. With high anxiety he has some chest pain and headaches.  States it come out of no where when stressed. When really anxious it can lead to depression if he can't calm down. Denies panic attacks. He may be having tremors when anxious but he is not sure. Pt has taken Ativan 3 times and it helped calm him down.   Sleep and appetite are good.   He is taking Lithium and Celexa as prescribed and reports possible SE of diarrhea 1-2 episode in the morning and none for the rest of day.  Pt states it is manageable.   Suicidal Ideation: No Plan Formed: No Patient has means to carry out plan: No  Homicidal Ideation: No Plan Formed: No Patient has means to carry out plan: No  Review of Systems: Psychiatric: Agitation: No Hallucination: No Depressed Mood: No Insomnia: No  Hypersomnia: No Altered Concentration: No Feels Worthless: No only when  depressed Grandiose Ideas: No  Belief In Special Powers: No New/Increased Substance Abuse: No Compulsions: No  Neurologic: Headache: Yes Seizure: No Paresthesias: No   Review of Systems  Constitutional: Negative for fever, chills and weight loss.  HENT: Negative for ear pain, hearing loss, sore throat and tinnitus.   Eyes: Negative for blurred vision, double vision and pain.  Respiratory: Negative for cough, sputum production and shortness of breath.   Cardiovascular: Negative for chest pain, palpitations and leg swelling.  Gastrointestinal: Positive for diarrhea. Negative for heartburn, nausea and vomiting.  Musculoskeletal: Negative for back pain, joint pain and neck pain.  Skin: Negative for itching and rash.  Neurological: Positive for tremors. Negative for dizziness, sensory change, seizures, loss of consciousness and headaches.  Psychiatric/Behavioral: Negative for depression, suicidal ideas, hallucinations, memory loss and substance abuse. The patient is not nervous/anxious and does not have insomnia.      Past Medical Family, Social History: Lives in [place] with his wife and 4 kids. 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 Pt reports that he has been smoking Cigarettes.  He has a 20 pack-year smoking history. He has never used smokeless tobacco. He reports that he does not drink alcohol or use illicit drugs.  Family History  Problem Relation Age of Onset  . Hypertension Other   . Cancer Neg Hx   . Diabetes Neg Hx   . Heart disease Neg Hx   .  Hyperlipidemia Neg Hx   . Stroke Neg Hx   . Suicidality Neg Hx   . Anxiety disorder Mother   . Alcohol abuse Father   . Drug abuse Father   . Bipolar disorder Sister   . Alcohol abuse Brother   . Drug abuse Brother   . Bipolar disorder Paternal Aunt   . Schizophrenia Paternal Aunt    Past Medical History  Diagnosis Date  . Low back pain   . Depression   . Anxiety   . Acute  kidney failure (HCC) 01/2012  . COPD (chronic obstructive pulmonary disease) (HCC)   . Bipolar disorder (HCC)     hypomania    Outpatient Encounter Prescriptions as of 08/15/2015  Medication Sig  . baclofen (LIORESAL) 10 MG tablet Take 10 mg by mouth as needed for muscle spasms.  . celecoxib (CELEBREX) 100 MG capsule Take 100 mg by mouth as needed.  . citalopram (CELEXA) 40 MG tablet Take 1 tablet (40 mg total) by mouth daily.  . cyclobenzaprine (FLEXERIL) 10 MG tablet Take 1 tablet (10 mg total) by mouth 3 (three) times daily as needed.  . gabapentin (NEURONTIN) 300 MG capsule Take 300 mg by mouth daily as needed.  . lithium carbonate (LITHOBID) 300 MG CR tablet Take 3 tablets (900 mg total) by mouth at bedtime.  Marland Kitchen. LORazepam (ATIVAN) 1 MG tablet Take 1 tablet (1 mg total) by mouth daily as needed for anxiety.  . meloxicam (MOBIC) 7.5 MG tablet Take 7.5 mg by mouth daily as needed for pain.  . tadalafil (CIALIS) 5 MG tablet Take 5 mg by mouth daily as needed for erectile dysfunction.   No facility-administered encounter medications on file as of 08/15/2015.    Past Psychiatric History/Hospitalization(s): Anxiety: Yes Bipolar Disorder: No Depression: Yes Mania: No Psychosis: No Schizophrenia: No Personality Disorder: No Hospitalization for psychiatric illness: No History of Electroconvulsive Shock Therapy: No Prior Suicide Attempts: No  Physical Exam: Constitutional:  BP 128/84 mmHg  Pulse 91  Ht 6\' 5"  (1.956 m)  Wt 252 lb (114.306 kg)  BMI 29.88 kg/m2  General Appearance: alert, oriented, no acute distress and well nourished  Musculoskeletal: Strength & Muscle Tone: within normal limits Gait & Station: normal Patient leans: N/A  Mental Status Examination/Evaluation: Objective: Attitude: Calm and cooperative  Appearance: Well Groomed, appears to be stated age  Eye Contact::  Good  Speech:  Clear and Coherent and Normal Rate  Volume:  Normal  Mood:  euthymic   Affect:  Constricted  Thought Process:  Goal Directed, Linear and Logical  Orientation:  Full (Time, Place, and Person)  Thought Content:  Negative  Suicidal Thoughts:  No  Homicidal Thoughts:  No  Judgement:  Fair  Insight:  Fair  Concentration: good  Memory: Immediate-fair Recent-fair Remote-fair  Recall: fair  Language: fair  Gait and Station: normal  [employer] of Knowledge: average  Psychomotor Activity:  Normal  Akathisia:  No  Handed:  Right  AIMS (if indicated):  n/a     Medical Decision Making (Choose Three): Established Problem, Stable/Improving (1), Review of Psycho-Social Stressors (1), Review or order clinical lab tests (1) and Review of Medication Regimen & Side Effects (2)  Assessment: AXIS I  Bipolar II current episode unspecified, Panic disorder without agorophobia  AXIS II  Deferred      Treatment Plan/Recommendations:  Plan of Care:  Medication management with supportive therapy. Risks/benefits and SE of the medication discussed. Pt verbalized understanding and verbal consent obtained for treatment.  Affirm with the patient that the medications are taken as ordered. Patient expressed understanding of how their medications were to be used.    Laboratory: reviewed labs 08/11/2015- WNL     Psychotherapy: Therapy: brief supportive therapy provided. Discussed psychosocial stressors in detail.  - monitoring weight (lno changed since last visit) and encouraged healthy diet -recommended light box therapy - discussed Asperger's. Pt may be on the spectrum but overall is maintaining healthy relationships. At this time no therapy is recommended as his quality of life is fine.   Medications: Lithobid  po qHS for mood stabilization due to GI SE.  Continue Celexa to  qD to target anxiety. Pt understands that treatment with antidepressant unopposed can trigger mania and is aware of what symptoms to monitor for.   -Ativan  po qD prn anxiety- will give  15 tabs  Routine PRN Medications: yes  Consultations: encouraged pt to f/up with pcp as needed  Declined therapy referral  Safety Concerns: Pt denies SI and is at an acute low risk for suicide.Patient told to call clinic if any problems occur. Patient advised to go to ER if they should develop SI/HI, side effects, or if symptoms worsen. Has crisis numbers to call if needed. Pt verbalized understanding.\  Other: F/up in 3 months or sooner if needed     [name], MD 08/15/2015

## 2015-09-04 MED FILL — CIALIS 5 MG TABLET: 5 | 11 days supply | Qty: 11 | Fill #0

## 2015-09-04 MED FILL — CELECOXIB 200 MG CAPSULE: 200 | 30 days supply | Qty: 30 | Fill #1

## 2015-09-04 MED FILL — GABAPENTIN 300 MG CAPSULE: 300 | 30 days supply | Qty: 60 | Fill #0

## 2015-09-18 MED FILL — CITALOPRAM HBR 40 MG TABLET: 40 | 30 days supply | Qty: 30 | Fill #1

## 2015-09-18 MED FILL — LITHIUM CARBONATE ER 300 MG: 300 | 30 days supply | Qty: 90 | Fill #1

## 2015-10-18 MED FILL — CIALIS 5 MG TABLET: 5 | 30 days supply | Qty: 6 | Fill #0

## 2015-10-18 MED FILL — CITALOPRAM HBR 40 MG TABLET: 40 | 30 days supply | Qty: 30 | Fill #2

## 2015-10-18 MED FILL — LITHIUM CARBONATE ER 300 MG: 300 | 30 days supply | Qty: 90 | Fill #2

## 2015-11-09 MED FILL — CELECOXIB 200 MG CAPSULE: 200 | 30 days supply | Qty: 30 | Fill #2

## 2015-11-09 MED FILL — CYCLOBENZAPRINE 10 MG TAB: 10 | 30 days supply | Qty: 60 | Fill #0

## 2015-11-14 ENCOUNTER — Encounter (HOSPITAL_COMMUNITY): Payer: Self-pay | Admitting: Psychiatry

## 2015-11-14 ENCOUNTER — Ambulatory Visit (INDEPENDENT_AMBULATORY_CARE_PROVIDER_SITE_OTHER): Payer: 59 | Admitting: Psychiatry

## 2015-11-14 VITALS — BP 132/86 | HR 96 | Ht 77.0 in | Wt 251.6 lb

## 2015-11-14 DIAGNOSIS — F411 Generalized anxiety disorder: Secondary | ICD-10-CM | POA: Diagnosis not present

## 2015-11-14 DIAGNOSIS — F410 Panic disorder [episodic paroxysmal anxiety] without agoraphobia: Secondary | ICD-10-CM

## 2015-11-14 DIAGNOSIS — F3181 Bipolar II disorder: Secondary | ICD-10-CM | POA: Diagnosis not present

## 2015-11-14 MED ORDER — BUSPIRONE HCL 10 MG PO TABS: 10.0000 mg | ORAL_TABLET | Freq: Two times a day (BID) | ORAL | Status: DC

## 2015-11-14 MED ORDER — LITHIUM CARBONATE ER 300 MG PO TBCR: 900.0000 mg | EXTENDED_RELEASE_TABLET | Freq: Every day | ORAL | Status: DC

## 2015-11-14 MED ORDER — LORAZEPAM 1 MG PO TABS: 1.0000 mg | ORAL_TABLET | Freq: Every day | ORAL | Status: DC | PRN

## 2015-11-14 MED ORDER — CITALOPRAM HYDROBROMIDE 40 MG PO TABS: 40.0000 mg | ORAL_TABLET | Freq: Every day | ORAL | Status: DC

## 2015-11-14 MED FILL — CITALOPRAM HBR 40 MG TABLET: 40 | 30 days supply | Qty: 30 | Fill #0

## 2015-11-14 MED FILL — LITHIUM CARBONATE ER 300 MG: 300 | 30 days supply | Qty: 90 | Fill #0

## 2015-11-14 MED FILL — LORazepam 1 MG TABS: 1 | 15 days supply | Qty: 15 | Fill #0

## 2015-11-14 MED FILL — busPIRone HCL 10 MG TABS: 10 | 30 days supply | Qty: 60 | Fill #0

## 2015-11-14 NOTE — Progress Notes (Signed)
Patient ID: Matthew [name] III, male   DOB: [birth year], 45 y.o.   MRN: [id number]  [facility] [id number] Progress Note  Matthew [name] [id number] 45 y.o.  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 States things at home are good.  Depression is well controlled but he is feeling low motivation. He gets depressed maybe a few days a month. On those days he feels a little down, irritative, a little isolative, feels tired and has low motivation and if extreme he may have passive thoughts of death.  Depression is not like it used to be. States it is usually related to anxiety and stressors. He has a few episodes since his last visit.  Denies isolation, crying, anhedonia, worthlessness and hopelessness.   2-3 times a month for 5 days he feels up. No change in sleep pattern. Energy is mildly up. He is not really impulsive. Pt reports he is more interactive and jovial. Denies manic and hypomanic symptoms including periods of decreased need for sleep, increased energy, mood lability, impulsivity, FOI, and excessive spending recently.  Anxiety comes and goes. It happens every few weeks. With high anxiety he has some chest pain, racing thoughts and headaches.  States it come out of no where when stressed. When really anxious it can lead to depression if he can't calm down. Denies panic attacks. He may be having tremors when anxious but he is not sure. Pt has taken Ativan a few times and it helped calm him down.   Sleep and appetite are good.   He is taking Lithium and Celexa as prescribed and reports possible SE of diarrhea 1-2 episode in the morning and none for the rest of day.  Pt states it is manageable.   Suicidal Ideation: No Plan Formed: No Patient has means to carry out plan: No  Homicidal Ideation: No Plan Formed: No Patient has means to carry out plan: No  Review of  Systems: Psychiatric: Agitation: No Hallucination: No Depressed Mood: Yes Insomnia: No  Hypersomnia: No Altered Concentration: No Feels Worthless: No only when depressed Grandiose Ideas: No  Belief In Special Powers: No New/Increased Substance Abuse: No Compulsions: No   Review of Systems  Constitutional: Negative for fever, chills and weight loss.  HENT: Negative for ear pain, hearing loss, sore throat and tinnitus.   Eyes: Negative for blurred vision, double vision and pain.  Respiratory: Negative for cough, sputum production and shortness of breath.   Cardiovascular: Negative for chest pain, palpitations and leg swelling.  Gastrointestinal: Positive for diarrhea. Negative for heartburn, nausea and vomiting.  Musculoskeletal: Positive for back pain. Negative for joint pain and neck pain.  Skin: Negative for itching and rash.  Neurological: Negative for dizziness, tremors, sensory change, seizures, loss of consciousness and headaches.  Psychiatric/Behavioral: Positive for depression. Negative for suicidal ideas, hallucinations, memory loss and substance abuse. The patient is nervous/anxious. The patient does not have insomnia.      Past Medical Family, Social History: Lives in [place] with his wife and 4 kids. 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 Pt reports that he has been smoking Cigarettes.  He has a 20 pack-year smoking history. He has never used smokeless tobacco. He reports that he does not drink alcohol or use illicit drugs.  Family History  Problem Relation Age of Onset  . Hypertension Other   .  Cancer Neg Hx   . Diabetes Neg Hx   . Heart disease Neg Hx   . Hyperlipidemia Neg Hx   . Stroke Neg Hx   . Suicidality Neg Hx   . Anxiety disorder Mother   . Alcohol abuse Father   . Drug abuse Father   . Bipolar disorder Sister   . Alcohol abuse Brother   . Drug abuse Brother   . Bipolar disorder Paternal Aunt   .  Schizophrenia Paternal Aunt    Past Medical History  Diagnosis Date  . Low back pain   . Depression   . Anxiety   . Acute kidney failure (HCC) 01/2012  . COPD (chronic obstructive pulmonary disease) (HCC)   . Bipolar disorder (HCC)     hypomania    Outpatient Encounter Prescriptions as of 11/14/2015  Medication Sig  . baclofen (LIORESAL) 10 MG tablet Take 10 mg by mouth as needed for muscle spasms.  . celecoxib (CELEBREX) 100 MG capsule Take 100 mg by mouth as needed.  . citalopram (CELEXA) 40 MG tablet Take 1 tablet (40 mg total) by mouth daily.  . cyclobenzaprine (FLEXERIL) 10 MG tablet Take 1 tablet (10 mg total) by mouth 3 (three) times daily as needed.  . gabapentin (NEURONTIN) 300 MG capsule Take 300 mg by mouth daily as needed.  . lithium carbonate (LITHOBID) 300 MG CR tablet Take 3 tablets (900 mg total) by mouth at bedtime.  Marland Kitchen. LORazepam (ATIVAN) 1 MG tablet Take 1 tablet (1 mg total) by mouth daily as needed for anxiety.  . meloxicam (MOBIC) 7.5 MG tablet Take 7.5 mg by mouth daily as needed for pain.  . tadalafil (CIALIS) 5 MG tablet Take 5 mg by mouth daily as needed for erectile dysfunction.   No facility-administered encounter medications on file as of 11/14/2015.    Past Psychiatric History/Hospitalization(s): Anxiety: Yes Bipolar Disorder: No Depression: Yes Mania: No Psychosis: No Schizophrenia: No Personality Disorder: No Hospitalization for psychiatric illness: No History of Electroconvulsive Shock Therapy: No Prior Suicide Attempts: No  Physical Exam: Constitutional:  BP 132/86 mmHg  Pulse 96  Ht 6\' 5"  (1.956 m)  Wt 251 lb 9.6 oz (114.125 kg)  BMI 29.83 kg/m2  General Appearance: alert, oriented, no acute distress and well nourished  Musculoskeletal: Strength & Muscle Tone: within normal limits Gait & Station: normal Patient leans: straight  Mental Status Examination/Evaluation: Objective: Attitude: Calm and cooperative  Appearance: Well  Groomed, appears to be stated age  Eye Contact::  Good  Speech:  Clear and Coherent and Normal Rate  Volume:  Normal  Mood:  euthymic  Affect:  Full Range  Thought Process:  Goal Directed, Linear and Logical  Orientation:  Full (Time, Place, and Person)  Thought Content:  Negative  Suicidal Thoughts:  No  Homicidal Thoughts:  No  Judgement:  Fair  Insight:  Fair  Concentration: good  Memory: Immediate-fair Recent-fair Remote-fair  Recall: fair  Language: fair  Gait and Station: normal  [employer] of Knowledge: average  Psychomotor Activity:  Normal  Akathisia:  No  Handed:  Right  AIMS (if indicated):  n/a     Medical Decision Making (Choose Three): Established Problem, Stable/Improving (1), Review of Psycho-Social Stressors (1), Established Problem, Worsening (2), Review of Medication Regimen & Side Effects (2) and Review of New Medication or Change in Dosage (2)  Assessment: AXIS I  Bipolar II current episode unspecified, Panic disorder without agorophobia; GAD  AXIS II  Deferred  Treatment Plan/Recommendations:  Plan of Care:  Medication management with supportive therapy. Risks/benefits and SE of the medication discussed. Pt verbalized understanding and verbal consent obtained for treatment.  Affirm with the patient that the medications are taken as ordered. Patient expressed understanding of how their medications were to be used.    Laboratory: reviewed labs 08/11/2015- WNL     Psychotherapy: Therapy: brief supportive therapy provided. Discussed psychosocial stressors in detail.  - monitoring weight (lno changed since last visit) and encouraged healthy diet -recommended light box therapy - discussed Asperger's. Pt may be on the spectrum but overall is maintaining healthy relationships. At this time no therapy is recommended as his quality of life is fine.   Medications: Lithobid  po qHS for mood stabilization due to GI SE.  Continue Celexa to  qD  to target anxiety. Pt understands that treatment with antidepressant unopposed can trigger mania and is aware of what symptoms to monitor for.   Start trial of Buspar  po BID for anxiety -Ativan  po qD prn anxiety- will give 15 tabs  Routine PRN Medications: yes  Consultations: encouraged pt to f/up with pcp as needed  Declined therapy referral  Safety Concerns: Pt denies SI and is at an acute low risk for suicide.Patient told to call clinic if any problems occur. Patient advised to go to ER if they should develop SI/HI, side effects, or if symptoms worsen. Has crisis numbers to call if needed. Pt verbalized understanding.\  Other: F/up in 3 months or sooner if needed     [name], MD 11/14/2015

## 2015-11-16 MED FILL — BACLOFEN 10 MG TABLET: 10 | 30 days supply | Qty: 90 | Fill #0

## 2015-12-13 DIAGNOSIS — M5126 Other intervertebral disc displacement, lumbar region: Secondary | ICD-10-CM | POA: Diagnosis not present

## 2015-12-13 DIAGNOSIS — M545 Low back pain: Secondary | ICD-10-CM | POA: Diagnosis not present

## 2015-12-13 DIAGNOSIS — M546 Pain in thoracic spine: Secondary | ICD-10-CM | POA: Diagnosis not present

## 2015-12-13 MED FILL — CELECOXIB 200 MG CAPSULE: 200 | 30 days supply | Qty: 30 | Fill #0

## 2015-12-13 MED FILL — BACLOFEN 10 MG TABLET: 10 | 30 days supply | Qty: 90 | Fill #0

## 2015-12-13 MED FILL — CYCLOBENZAPRINE 10 MG TAB: 10 | 30 days supply | Qty: 60 | Fill #0

## 2015-12-13 MED FILL — GABAPENTIN 300 MG CAPSULE: 300 | 30 days supply | Qty: 60 | Fill #0

## 2015-12-21 ENCOUNTER — Encounter (HOSPITAL_COMMUNITY): Payer: Self-pay

## 2015-12-21 ENCOUNTER — Emergency Department (HOSPITAL_COMMUNITY): Payer: 59

## 2015-12-21 ENCOUNTER — Emergency Department (HOSPITAL_COMMUNITY)
Admission: EM | Admit: 2015-12-21 | Discharge: 2015-12-21 | Disposition: A | Discharge status: Home / Self Care | Payer: 59 | Attending: Emergency Medicine | Admitting: Emergency Medicine

## 2015-12-21 DIAGNOSIS — F319 Bipolar disorder, unspecified: Secondary | ICD-10-CM | POA: Diagnosis not present

## 2015-12-21 DIAGNOSIS — Z87448 Personal history of other diseases of urinary system: Secondary | ICD-10-CM | POA: Diagnosis not present

## 2015-12-21 DIAGNOSIS — R079 Chest pain, unspecified: Secondary | ICD-10-CM | POA: Diagnosis not present

## 2015-12-21 DIAGNOSIS — F17210 Nicotine dependence, cigarettes, uncomplicated: Secondary | ICD-10-CM | POA: Insufficient documentation

## 2015-12-21 DIAGNOSIS — F419 Anxiety disorder, unspecified: Secondary | ICD-10-CM | POA: Diagnosis not present

## 2015-12-21 DIAGNOSIS — Z79899 Other long term (current) drug therapy: Secondary | ICD-10-CM | POA: Insufficient documentation

## 2015-12-21 DIAGNOSIS — J449 Chronic obstructive pulmonary disease, unspecified: Secondary | ICD-10-CM | POA: Insufficient documentation

## 2015-12-21 DIAGNOSIS — R0789 Other chest pain: Secondary | ICD-10-CM | POA: Diagnosis not present

## 2015-12-21 LAB — BASIC METABOLIC PANEL
Anion gap: 10 (ref 5–15)
BUN: 7 mg/dL (ref 6–20)
CO2: 25 mmol/L (ref 22–32)
Calcium: 9.1 mg/dL (ref 8.9–10.3)
Chloride: 104 mmol/L (ref 101–111)
Creatinine, Ser: 1.03 mg/dL (ref 0.61–1.24)
GFR calc Af Amer: >60 mL/min (ref >60)
GFR calc non Af Amer: >60 mL/min (ref >60)
Glucose, Bld: 92 mg/dL (ref 65–99)
Potassium: 4.1 mmol/L (ref 3.5–5.1)
Sodium: 139 mmol/L (ref 135–145)

## 2015-12-21 LAB — CBC
HCT: 45 % (ref 39.0–52.0)
Hemoglobin: 15 g/dL (ref 13.0–17.0)
MCH: 30.5 pg (ref 26.0–34.0)
MCHC: 33.3 g/dL (ref 30.0–36.0)
MCV: 91.5 fL (ref 78.0–100.0)
Platelets: 230 10*3/uL (ref 150–400)
RBC: 4.92 MIL/uL (ref 4.22–5.81)
RDW: 13.1 % (ref 11.5–15.5)
WBC: 9.6 10*3/uL (ref 4.0–10.5)

## 2015-12-21 LAB — I-STAT TROPONIN, ED: Troponin i, poc: 0 ng/mL (ref 0.00–0.08)

## 2015-12-21 MED ORDER — IBUPROFEN 400 MG PO TABS
600.0000 mg | ORAL_TABLET | Freq: Once | ORAL | Status: AC
  Administered 2015-12-21: 600 mg via ORAL
  Filled 2015-12-21: qty 1

## 2015-12-21 MED FILL — LITHIUM CARBONATE ER 300 MG: 300 | 30 days supply | Qty: 90 | Fill #1

## 2015-12-21 MED FILL — busPIRone HCL 10 MG TABS: 10 | 30 days supply | Qty: 60 | Fill #1

## 2015-12-21 MED FILL — CITALOPRAM HBR 40 MG TABLET: 40 | 30 days supply | Qty: 30 | Fill #1

## 2015-12-21 NOTE — ED Notes (Signed)
MD at bedside. 

## 2015-12-21 NOTE — ED Notes (Signed)
Patient here with central chest pain since yesterday afternoon. Reports no radiation\, no associated symptoms. Has had intermittent jaw pain with same. Took mobic earlier due to increased pain with movement and no relief. No cardiac history

## 2015-12-21 NOTE — Discharge Instructions (Signed)

## 2015-12-22 ENCOUNTER — Ambulatory Visit (INDEPENDENT_AMBULATORY_CARE_PROVIDER_SITE_OTHER): Payer: 59 | Admitting: Family Medicine

## 2015-12-22 ENCOUNTER — Encounter: Payer: Self-pay | Admitting: Family Medicine

## 2015-12-22 VITALS — BP 124/80 | HR 93 | Wt 248.0 lb

## 2015-12-22 DIAGNOSIS — S29011A Strain of muscle and tendon of front wall of thorax, initial encounter: Secondary | ICD-10-CM | POA: Diagnosis not present

## 2015-12-22 MED ORDER — HYDROCODONE-ACETAMINOPHEN 10-325 MG PO TABS: 1.0000 | ORAL_TABLET | Freq: Four times a day (QID) | ORAL | Status: DC | PRN

## 2015-12-22 MED FILL — HYDROCODON-APAP 10-325: 10-325 | 2 days supply | Qty: 15 | Fill #0

## 2015-12-22 NOTE — Progress Notes (Signed)
   Subjective:    Patient ID: Matthew [name], male    DOB: 01-26-1971, 45 y.o.   MRN: [id number]  Seen for Same day visit for   CC: Right-sided chest pain  He reports right-sided chest pain for the past 3 days.  Chest pain in the right anterior chest radiating to right shoulder.  Pain is made worse with movement.  He denies any trauma but reports playing basketball for the first time in a while on Sunday as well as doing some lifting of heavy boxes Wednesday.  Chest pain is described as constant achiness with intermittent sharp pain with movement.  He denies any shortness of breath, palpitations, nausea, vomiting, diarrhea, fever, chills, cough.  Denies history of similar pain.  He does smoke.  Reports family history of MI and grandfather prior to the age of 45.   Smoking history noted Review of Systems   See HPI for ROS. Objective:  BP 124/80 mmHg  Pulse 93  Wt 248 lb (112.492 kg)  SpO2 95%  General: NAD Cardiac: RRR, normal heart sounds, no murmurs. 2+ radial and PT pulses bilaterally Respiratory: CTAB, normal effort MSK: Pain reproduced with palpation over right pectoralis as well as resisted arm abduction Abdomen: soft, nontender, nondistended,  Bowel sounds present Extremities: no edema or cyanosis. WWP. Skin: warm and dry, no rashes noted  Assessment & Plan:   Chest wall muscle strain Most likely muscle skeletal etiology given reproducible with palpation as well as worsening pain with movement.  Reviewed imaging and blood work obtained at urgent care yesterday that showed: Negative troponin, EKG normal sinus rhythm without acute changes, negative chest x-ray and CMP.  - Recommended he continue his celecoxib and baclofen as prescribed - Discussed heating pads and gentle stretching - Prescribed Vicodin # 15 for additional pain relief when necessary.  Cautioned not to drive on taking medication - Follow-up in 1-2 weeks if pain persists or new or concerning  symptoms

## 2015-12-22 NOTE — Patient Instructions (Signed)
Your right-sided chest pain is most consistent with muscle skeletal strain.  - Aching or celecoxib and baclofen as prescribed - You can use heating pad for additional relief and gentle stretching - You can take 1-2 Vicodin every 6 hours as needed for additional pain relief.  - This pain should improve in the next 2-3 days and resolve within a 1-2 weeks.  If you develop any new or concerning symptoms or your pain is not resolving, please return to clinic for additional evaluation

## 2015-12-22 NOTE — Assessment & Plan Note (Signed)
Most likely muscle skeletal etiology given reproducible with palpation as well as worsening pain with movement.  Reviewed imaging and blood work obtained at urgent care yesterday that showed: Negative troponin, EKG normal sinus rhythm without acute changes, negative chest x-ray and CMP.  - Recommended he continue his celecoxib and baclofen as prescribed - Discussed heating pads and gentle stretching - Prescribed Vicodin # 15 for additional pain relief when necessary.  Cautioned not to drive on taking medication - Follow-up in 1-2 weeks if pain persists or new or concerning symptoms

## 2015-12-28 NOTE — ED Provider Notes (Signed)
CSN: [id number]     Arrival date & time 12/21/15  1342 History   First MD Initiated Contact with Patient 12/21/15 1433     Chief Complaint  Patient presents with  . Chest Pain     (Consider location/radiation/quality/duration/timing/severity/associated sxs/prior Treatment) HPI   45 year old male with chest pain. Onset yesterday afternoon. Constant since then. Does not radiate, but has had intermittent jaw pain. No other associated symptoms such as dyspnea, diaphoresis, palpitations or nausea. No dizziness or lightheadedness. Pain very reproducible with movement and palpation over his left parasternal border. He denies any trauma or overuse/strain. No fevers or chills. No unusual leg swelling.  Past Medical History  Diagnosis Date  . Low back pain   . Depression   . Anxiety   . Acute kidney failure (HCC) 01/2012  . COPD (chronic obstructive pulmonary disease) (HCC)   . Bipolar disorder (HCC)     hypomania   Past Surgical History  Procedure Laterality Date  . No past surgeries     Family History  Problem Relation Age of Onset  . Hypertension Other   . Cancer Neg Hx   . Diabetes Neg Hx   . Heart disease Neg Hx   . Hyperlipidemia Neg Hx   . Stroke Neg Hx   . Suicidality Neg Hx   . Anxiety disorder Mother   . Alcohol abuse Father   . Drug abuse Father   . Bipolar disorder Sister   . Alcohol abuse Brother   . Drug abuse Brother   . Bipolar disorder Paternal Aunt   . Schizophrenia Paternal Aunt    Social History  Substance Use Topics  . Smoking status: Current Every Day Smoker -- 1.00 packs/day for 20 years    Types: Cigarettes  . Smokeless tobacco: Never Used  . Alcohol Use: No     Comment: 2-3 drinks every month    Review of Systems  All systems reviewed and negative, other than as noted in HPI.   Allergies  Ciprofloxacin  Home Medications   Prior to Admission medications   Medication Sig Start Date End Date Taking? Authorizing Provider  baclofen (LIORESAL)  10 MG tablet Take 10 mg by mouth as needed for muscle spasms.   Yes Historical Provider, MD  busPIRone (BUSPAR) 10 MG tablet Take 1 tablet (10 mg total) by mouth 2 (two) times daily. 11/14/15  Yes [name], MD  celecoxib (CELEBREX) 100 MG capsule Take 100 mg by mouth as needed for mild pain.    Yes Historical Provider, MD  citalopram (CELEXA) 40 MG tablet Take 1 tablet (40 mg total) by mouth daily. 11/14/15  Yes [name], MD  cyclobenzaprine (FLEXERIL) 10 MG tablet Take 1 tablet (10 mg total) by mouth 3 (three) times daily as needed. Patient taking differently: Take 10 mg by mouth 3 (three) times daily as needed for muscle spasms.  06/26/12  Yes Lorre Munroe, NP  gabapentin (NEURONTIN) 300 MG capsule Take 300 mg by mouth daily as needed. For pain   Yes Historical Provider, MD  lithium carbonate (LITHOBID) 300 MG CR tablet Take 3 tablets (900 mg total) by mouth at bedtime. 11/14/15 11/13/16 Yes [name], MD  LORazepam (ATIVAN) 1 MG tablet Take 1 tablet (1 mg total) by mouth daily as needed for anxiety. 11/14/15 11/13/16 Yes [name], MD  tadalafil (CIALIS) 5 MG tablet Take 5 mg by mouth daily as needed for erectile dysfunction.   Yes Historical Provider, MD  HYDROcodone-acetaminophen (NORCO) 10-325 MG tablet  Take 1-2 tablets by mouth every 6 (six) hours as needed. 12/22/15   Jamal CollinJames R Joyner, MD   BP 131/90 mmHg  Pulse 82  Temp(Src) 97.4 F (36.3 C) (Oral)  Resp 18  Ht 6\' 5"  (1.956 m)  Wt 248 lb (112.492 kg)  BMI 29.40 kg/m2  SpO2 98% Physical Exam  Constitutional: He appears well-developed and well-nourished. No distress.  HENT:  Head: Normocephalic and atraumatic.  Eyes: Conjunctivae are normal. Right eye exhibits no discharge. Left eye exhibits no discharge.  Neck: Neck supple.  Cardiovascular: Normal rate, regular rhythm and normal heart sounds.  Exam reveals no gallop and no friction rub.   No murmur heard. Pulmonary/Chest: Effort normal and breath sounds normal. No  respiratory distress. He exhibits tenderness.  Tenderness to palpation along the sternum and left parasternal border. No overlying skin changes.  Abdominal: Soft. He exhibits no distension. There is no tenderness.  Musculoskeletal: He exhibits no edema or tenderness.  Lower extremities symmetric as compared to each other. No calf tenderness. Negative Homan's. No palpable cords.   Neurological: He is alert.  Skin: Skin is warm and dry.  Psychiatric: He has a normal mood and affect. His behavior is normal. Thought content normal.  Nursing note and vitals reviewed.   ED Course  Procedures (including critical care time) Labs Review Labs Reviewed  BASIC METABOLIC PANEL  CBC  I-STAT TROPOININ, ED    Imaging Review No results found.   Dg Chest 2 View  12/21/2015  CLINICAL DATA:  Chest pain EXAM: CHEST  2 VIEW COMPARISON:  10/09/2012 chest radiograph. FINDINGS: Stable cardiomediastinal silhouette with normal heart size. No pneumothorax. No pleural effusion. Lungs appear clear, with no acute consolidative airspace disease and no pulmonary edema. IMPRESSION: No active cardiopulmonary disease. Electronically Signed   By: Delbert PhenixJason A Poff M.D.   On: 12/21/2015 14:22   I have personally reviewed and evaluated these images and lab results as part of my medical decision-making.   EKG Interpretation   Date/Time:  Thursday Dec 21 2015 13:49:48 EDT Ventricular Rate:  84 PR Interval:  146 QRS Duration: 92 QT Interval:  384 QTC Calculation: 453 R Axis:   64 Text Interpretation:  Normal sinus rhythm Normal ECG No old tracing to  compare Confirmed by Roosevelt Eimers  MD, Christophere Hillhouse ([id number]) on 12/21/2015 2:57:02 PM      MDM   Final diagnoses:  Chest pain, unspecified chest pain type    44yM with CP. Atypical for ACs. Doubt PE, dissection, serious infection or other emergent process.Suspect that is likely musculoskeletal given how reproducible it is. No rash. Nondermatomal. Denies recent overuse/strain. It has  been determined that no acute conditions requiring further emergency intervention are present at this time. The patient has been advised of the diagnosis and plan. I reviewed any labs and imaging including any potential incidental findings. We have discussed signs and symptoms that warrant return to the ED and they are listed in the discharge instructions.      Raeford RazorStephen Cieara Stierwalt, MD 12/28/15 1200

## 2016-01-22 MED FILL — busPIRone HCL 10 MG TABS: 10 | 30 days supply | Qty: 60 | Fill #2

## 2016-01-22 MED FILL — LITHIUM CARBONATE ER 300 MG: 300 | 30 days supply | Qty: 90 | Fill #2

## 2016-01-22 MED FILL — CITALOPRAM HBR 40 MG TABLET: 40 | 30 days supply | Qty: 30 | Fill #2

## 2016-02-22 ENCOUNTER — Encounter (HOSPITAL_COMMUNITY): Payer: Self-pay | Admitting: Psychiatry

## 2016-02-22 ENCOUNTER — Ambulatory Visit (INDEPENDENT_AMBULATORY_CARE_PROVIDER_SITE_OTHER): Payer: 59 | Admitting: Psychiatry

## 2016-02-22 VITALS — BP 120/78 | HR 95 | Ht 77.0 in | Wt 244.6 lb

## 2016-02-22 DIAGNOSIS — Z79899 Other long term (current) drug therapy: Secondary | ICD-10-CM | POA: Diagnosis not present

## 2016-02-22 DIAGNOSIS — F3181 Bipolar II disorder: Secondary | ICD-10-CM

## 2016-02-22 DIAGNOSIS — F410 Panic disorder [episodic paroxysmal anxiety] without agoraphobia: Secondary | ICD-10-CM

## 2016-02-22 DIAGNOSIS — F411 Generalized anxiety disorder: Secondary | ICD-10-CM

## 2016-02-22 MED ORDER — LITHIUM CARBONATE ER 300 MG PO TBCR: 900.0000 mg | EXTENDED_RELEASE_TABLET | Freq: Every day | ORAL | Status: DC

## 2016-02-22 MED ORDER — CITALOPRAM HYDROBROMIDE 40 MG PO TABS: 40.0000 mg | ORAL_TABLET | Freq: Every day | ORAL | Status: DC

## 2016-02-22 MED ORDER — BUSPIRONE HCL 15 MG PO TABS: 15.0000 mg | ORAL_TABLET | Freq: Two times a day (BID) | ORAL | Status: DC

## 2016-02-22 MED ORDER — LORAZEPAM 1 MG PO TABS: 1.0000 mg | ORAL_TABLET | Freq: Every day | ORAL | Status: DC | PRN

## 2016-02-22 MED FILL — CITALOPRAM HBR 40 MG TABLET: 40 | 30 days supply | Qty: 30 | Fill #0

## 2016-02-22 MED FILL — LITHIUM CARBONATE ER 300 MG: 300 | 30 days supply | Qty: 90 | Fill #0

## 2016-02-22 MED FILL — busPIRone HCL 15 MG TABS: 15 | 30 days supply | Qty: 60 | Fill #0

## 2016-02-22 MED FILL — LORazepam 1 MG TABS: 1 | 15 days supply | Qty: 15 | Fill #0

## 2016-02-22 NOTE — Progress Notes (Signed)
Patient ID: Matthew [name], male   DOB: Sep 21, 1970, 45 y.o.   MRN: [id number] Patient ID: Matthew [name], male   DOB: 01-Oct-1970, 45 y.o.   MRN: [id number]  [facility] [id number] Progress Note  Matthew [name] [id number] 45 y.o.  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 States things at home are good.  Depression is well controlled but he is feeling low motivation. He gets depressed maybe a couple days a month. On those days he feels a little down, irritative, a little isolative, feels tired and has low motivation and if extreme he may have passive thoughts of death.  Depression is not like it used to be and seems to be more stress induced. States it is usually related to anxiety and stressors. He has a few episodes since his last visit.  Denies isolation, crying, anhedonia, worthlessness and hopelessness.   2-3 times a month for 5 days he feels up. No change in sleep pattern. Energy is mildly up. He is not really impulsive. Pt reports he is more interactive and jovial. Denies manic and hypomanic symptoms including periods of decreased need for sleep, increased energy, mood lability, impulsivity, FOI, and excessive spending recently. Wife tells him his "ups" are more controlled.   Anxiety comes and goes. With high anxiety he has some chest pain, racing thoughts and headaches.  States it come out of no where when stressed. When really anxious it can lead to depression if he can't calm down. Denies panic attacks. He may be having tremors when anxious but he is not sure. He has been taking Buspar twice a day and notes it helps to decrease the intensity of his anxiety. After one month he ran out and noted a dramatic increase in anxiety with severe chest pain that caused him to go to the ED. It was diagnosed with muscular pain and was given Norco. Pt has taken Ativan a few times and it helped  calm him down.   Sleep and appetite are good.   He is taking Lithium and Celexa as prescribed and reports possible SE of diarrhea 1-2 episode in the morning and none for the rest of day.  Pt states it is manageable.   Suicidal Ideation: No Plan Formed: No Patient has means to carry out plan: No  Homicidal Ideation: No Plan Formed: No Patient has means to carry out plan: No  Review of Systems: Psychiatric: Agitation: No Hallucination: No Depressed Mood: Yes Insomnia: No  Hypersomnia: No Altered Concentration: No Feels Worthless: No only when depressed Grandiose Ideas: No  Belief In Special Powers: No New/Increased Substance Abuse: No Compulsions: No   Review of Systems  Constitutional: Negative for fever, chills and weight loss.  HENT: Negative for ear pain, hearing loss, sore throat and tinnitus.   Eyes: Negative for blurred vision, double vision and pain.  Respiratory: Negative for cough, sputum production and shortness of breath.   Cardiovascular: Negative for chest pain, palpitations and leg swelling.  Gastrointestinal: Positive for diarrhea. Negative for heartburn, nausea and vomiting.  Musculoskeletal: Positive for back pain. Negative for joint pain and neck pain.  Skin: Negative for itching and rash.  Neurological: Negative for dizziness, tremors, sensory change, seizures, loss of consciousness and headaches.  Psychiatric/Behavioral: Negative for depression, suicidal ideas, hallucinations, memory loss and substance abuse. The patient is nervous/anxious. The patient does not have insomnia.  Past Medical Family, Social History: [place] with his wife and 4 kids. 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 Pt reports that he has been smoking Cigarettes.  He has a 20 pack-year smoking history. He has never used smokeless tobacco. He reports that he does not drink alcohol or use illicit drugs.  Family History  Problem  Relation Age of Onset  . Hypertension Other   . Cancer Neg Hx   . Diabetes Neg Hx   . Heart disease Neg Hx   . Hyperlipidemia Neg Hx   . Stroke Neg Hx   . Suicidality Neg Hx   . Anxiety disorder Mother   . Alcohol abuse Father   . Drug abuse Father   . Bipolar disorder Sister   . Alcohol abuse Brother   . Drug abuse Brother   . Bipolar disorder Paternal Aunt   . Schizophrenia Paternal Aunt    Past Medical History  Diagnosis Date  . Low back pain   . Depression   . Anxiety   . Acute kidney failure (HCC) 01/2012  . COPD (chronic obstructive pulmonary disease) (HCC)   . Bipolar disorder (HCC)     hypomania    Outpatient Encounter Prescriptions as of 02/22/2016  Medication Sig  . baclofen (LIORESAL) 10 MG tablet Take 10 mg by mouth as needed for muscle spasms.  . busPIRone (BUSPAR) 10 MG tablet Take 1 tablet (10 mg total) by mouth 2 (two) times daily.  . celecoxib (CELEBREX) 100 MG capsule Take 100 mg by mouth as needed for mild pain.   . citalopram (CELEXA) 40 MG tablet Take 1 tablet (40 mg total) by mouth daily.  . cyclobenzaprine (FLEXERIL) 10 MG tablet Take 1 tablet (10 mg total) by mouth 3 (three) times daily as needed. (Patient taking differently: Take 10 mg by mouth 3 (three) times daily as needed for muscle spasms. )  . gabapentin (NEURONTIN) 300 MG capsule Take 300 mg by mouth daily as needed. For pain  . HYDROcodone-acetaminophen (NORCO) 10-325 MG tablet Take 1-2 tablets by mouth every 6 (six) hours as needed.  . lithium carbonate (LITHOBID) 300 MG CR tablet Take 3 tablets (900 mg total) by mouth at bedtime.  Marland Kitchen. LORazepam (ATIVAN) 1 MG tablet Take 1 tablet (1 mg total) by mouth daily as needed for anxiety.  . tadalafil (CIALIS) 5 MG tablet Take 5 mg by mouth daily as needed for erectile dysfunction.   No facility-administered encounter medications on file as of 02/22/2016.    Past Psychiatric History/Hospitalization(s): Anxiety: Yes Bipolar Disorder: No Depression:  Yes Mania: No Psychosis: No Schizophrenia: No Personality Disorder: No Hospitalization for psychiatric illness: No History of Electroconvulsive Shock Therapy: No Prior Suicide Attempts: No  Physical Exam: Constitutional:  BP 120/78 mmHg  Pulse 95  Ht 6\' 5"  (1.956 m)  Wt 244 lb 9.6 oz (110.95 kg)  BMI 29.00 kg/m2  General Appearance: alert, oriented, no acute distress and well nourished  Musculoskeletal: Strength & Muscle Tone: within normal limits Gait & Station: normal Patient leans: straight  Mental Status Examination/Evaluation: Objective: Attitude: Calm and cooperative  Appearance: Well Groomed, appears to be stated age  Eye Contact::  Good  Speech:  Clear and Coherent and Normal Rate  Volume:  Normal  Mood:  euthymic  Affect:  Blunt  Thought Process:  Goal Directed, Linear and Logical  Orientation:  Full (Time, Place, and Person)  Thought Content:  Negative  Suicidal  Thoughts:  No  Homicidal Thoughts:  No  Judgement:  Fair  Insight:  Fair  Concentration: good  Memory: Immediate-fair Recent-fair Remote-fair  Recall: fair  Language: fair  Gait and Station: normal  [employer] of Knowledge: average  Psychomotor Activity:  Normal  Akathisia:  No  Handed:  Right  AIMS (if indicated):  n/a     Medical Decision Making (Choose Three): Established Problem, Stable/Improving (1), Review of Psycho-Social Stressors (1), Review of Medication Regimen & Side Effects (2) and Review of New Medication or Change in Dosage (2)  Assessment: AXIS I  Bipolar II current episode unspecified, Panic disorder without agorophobia; GAD  AXIS II  Deferred      Treatment Plan/Recommendations:  Plan of Care:  Medication management with supportive therapy. Risks/benefits and SE of the medication discussed. Pt verbalized understanding and verbal consent obtained for treatment.  Affirm with the patient that the medications are taken as ordered. Patient expressed understanding of how  their medications were to be used.    Laboratory:Lithium level, CMP with Bun/Creatinine, TSH      Psychotherapy: Therapy: brief supportive therapy provided. Discussed psychosocial stressors in detail.  - monitoring weight (lno changed since last visit) and encouraged healthy diet -recommended light box therapy - discussed Asperger's. Pt may be on the spectrum but overall is maintaining healthy relationships. At this time no therapy is recommended as his quality of life is fine.   Medications: Lithobid 900mg  po qHS for mood stabilization due to GI SE.  Continue Celexa to 40mg  qD to target anxiety. Pt understands that treatment with antidepressant unopposed can trigger mania and is aware of what symptoms to monitor for.   Increase  Buspar 15mg  po BID for anxiety -Ativan 1mg  po qD prn anxiety- will give 15 tabs  Routine PRN Medications: yes  Consultations: encouraged pt to f/up with pcp as needed  Declined therapy referral  Safety Concerns: Pt denies SI and is at an acute low risk for suicide.Patient told to call clinic if any problems occur. Patient advised to go to ER if they should develop SI/HI, side effects, or if symptoms worsen. Has crisis numbers to call if needed. Pt verbalized understanding.\  Other: F/up in 3 months or sooner if needed     Oletta DarterSalina Jacqulin Brandenburger, MD 02/22/2016

## 2016-03-25 MED FILL — CELECOXIB 200 MG CAPSULE: 200 | 30 days supply | Qty: 30 | Fill #1

## 2016-03-25 MED FILL — busPIRone HCL 15 MG TABS: 15 | 30 days supply | Qty: 60 | Fill #1

## 2016-03-25 MED FILL — CITALOPRAM HBR 40 MG TABLET: 40 | 30 days supply | Qty: 30 | Fill #1

## 2016-03-25 MED FILL — LITHIUM CARBONATE ER 300 MG: 300 | 30 days supply | Qty: 90 | Fill #1

## 2016-03-25 MED FILL — CYCLOBENZAPRINE 10 MG TAB: 10 | 30 days supply | Qty: 60 | Fill #1

## 2016-04-24 MED FILL — GABAPENTIN 300 MG CAPSULE: 300 | 30 days supply | Qty: 60 | Fill #1

## 2016-04-24 MED FILL — CITALOPRAM HBR 40 MG TABLET: 40 | 30 days supply | Qty: 30 | Fill #2

## 2016-04-24 MED FILL — busPIRone HCL 15 MG TABS: 15 | 30 days supply | Qty: 60 | Fill #2

## 2016-04-24 MED FILL — LITHIUM CARBONATE ER 300 MG: 300 | 30 days supply | Qty: 90 | Fill #2

## 2016-04-26 DIAGNOSIS — R21 Rash and other nonspecific skin eruption: Secondary | ICD-10-CM | POA: Diagnosis not present

## 2016-04-26 DIAGNOSIS — B880 Other acariasis: Secondary | ICD-10-CM | POA: Diagnosis not present

## 2016-04-26 MED FILL — raNITIdine HCL 150 MG TABS: 150 | 15 days supply | Qty: 30 | Fill #0

## 2016-05-02 ENCOUNTER — Other Ambulatory Visit (HOSPITAL_COMMUNITY): Payer: Self-pay

## 2016-05-02 DIAGNOSIS — F3181 Bipolar II disorder: Secondary | ICD-10-CM

## 2016-05-02 DIAGNOSIS — F410 Panic disorder [episodic paroxysmal anxiety] without agoraphobia: Secondary | ICD-10-CM

## 2016-05-02 MED ORDER — LITHIUM CARBONATE ER 300 MG PO TBCR: 1200.0000 mg | EXTENDED_RELEASE_TABLET | Freq: Every day | ORAL | 2 refills | Status: DC

## 2016-05-02 MED ORDER — LORAZEPAM 1 MG PO TABS: 1.0000 mg | ORAL_TABLET | Freq: Every day | ORAL | 0 refills | Status: DC | PRN

## 2016-05-02 MED FILL — LORazepam 1 MG TABS: 1 | 15 days supply | Qty: 15 | Fill #0

## 2016-05-02 NOTE — Progress Notes (Signed)
Patient stopped by saying he needed a refill on Ativan. Refill is appropriate and I called in a one month supply. Patient has a follow up in Oct. Patient also reports that he is not doing well, He says "everything is falling apart" he does not feel like the medications he is currently on are working anymore. I called Dr. Michae KavaAgarwal who increased his Lithium and ordered labs for lithium levels and TSH. I sent in the new order for the Lithium increase and put the order in for the labs. I called patient and he is aware, I told him to call back in a week and let us know how he is doing.

## 2016-05-09 ENCOUNTER — Telehealth (HOSPITAL_COMMUNITY): Payer: Self-pay

## 2016-05-09 DIAGNOSIS — Z79899 Other long term (current) drug therapy: Secondary | ICD-10-CM | POA: Diagnosis not present

## 2016-05-09 NOTE — Telephone Encounter (Signed)
Patient called and would like to know if you can add a CBC w/ diff and a CMP to his labs. Please review and advise, thank you

## 2016-05-09 NOTE — Telephone Encounter (Signed)
Yes- go ahead and order them

## 2016-05-10 ENCOUNTER — Other Ambulatory Visit (HOSPITAL_COMMUNITY): Payer: Self-pay

## 2016-05-10 DIAGNOSIS — F3181 Bipolar II disorder: Secondary | ICD-10-CM

## 2016-05-10 LAB — COMPREHENSIVE METABOLIC PANEL
ALK PHOS: 72 U/L (ref 40–115)
ALT: 28 U/L (ref 9–46)
AST: 14 U/L (ref 10–40)
Albumin: 4.6 g/dL (ref 3.6–5.1)
BILIRUBIN TOTAL: 0.4 mg/dL (ref 0.2–1.2)
BUN: 14 mg/dL (ref 7–25)
CO2: 21 mmol/L (ref 20–31)
CREATININE: 0.99 mg/dL (ref 0.60–1.35)
Calcium: 9.3 mg/dL (ref 8.6–10.3)
Chloride: 102 mmol/L (ref 98–110)
Glucose, Bld: 122 mg/dL — ABNORMAL HIGH (ref 65–99)
POTASSIUM: 3.9 mmol/L (ref 3.5–5.3)
SODIUM: 138 mmol/L (ref 135–146)
TOTAL PROTEIN: 6.9 g/dL (ref 6.1–8.1)

## 2016-05-10 LAB — LITHIUM LEVEL: Lithium Lvl: 0.5 mmol/L — ABNORMAL LOW (ref 0.80–1.40)

## 2016-05-10 LAB — TSH: TSH: 1.54 m[IU]/L (ref 0.40–4.50)

## 2016-05-10 NOTE — Telephone Encounter (Signed)
Labs have been ordered and I called patient to let him know

## 2016-05-20 ENCOUNTER — Other Ambulatory Visit (HOSPITAL_COMMUNITY): Payer: Self-pay | Admitting: Psychiatry

## 2016-05-20 DIAGNOSIS — F411 Generalized anxiety disorder: Secondary | ICD-10-CM

## 2016-05-20 DIAGNOSIS — F410 Panic disorder [episodic paroxysmal anxiety] without agoraphobia: Secondary | ICD-10-CM

## 2016-05-20 MED FILL — LITHIUM CARBONATE ER 300 MG: 300 | 30 days supply | Qty: 120 | Fill #0

## 2016-05-21 MED FILL — busPIRone HCL 15 MG TABS: 15 | 30 days supply | Qty: 60 | Fill #0

## 2016-05-21 MED FILL — CITALOPRAM HBR 40 MG TABLET: 40 | 30 days supply | Qty: 30 | Fill #0

## 2016-06-04 MED FILL — CYCLOBENZAPRINE 10 MG TAB: 10 | 30 days supply | Qty: 60 | Fill #2

## 2016-06-04 MED FILL — CELECOXIB 200 MG CAPSULE: 200 | 30 days supply | Qty: 30 | Fill #2

## 2016-06-06 ENCOUNTER — Encounter (HOSPITAL_COMMUNITY): Payer: Self-pay | Admitting: Psychiatry

## 2016-06-06 ENCOUNTER — Ambulatory Visit (INDEPENDENT_AMBULATORY_CARE_PROVIDER_SITE_OTHER): Payer: 59 | Admitting: Psychiatry

## 2016-06-06 VITALS — BP 128/80 | HR 87 | Ht 77.0 in | Wt 237.0 lb

## 2016-06-06 DIAGNOSIS — Z813 Family history of other psychoactive substance abuse and dependence: Secondary | ICD-10-CM

## 2016-06-06 DIAGNOSIS — F411 Generalized anxiety disorder: Secondary | ICD-10-CM

## 2016-06-06 DIAGNOSIS — F410 Panic disorder [episodic paroxysmal anxiety] without agoraphobia: Secondary | ICD-10-CM

## 2016-06-06 DIAGNOSIS — R45850 Homicidal ideations: Secondary | ICD-10-CM | POA: Diagnosis not present

## 2016-06-06 DIAGNOSIS — F3130 Bipolar disorder, current episode depressed, mild or moderate severity, unspecified: Secondary | ICD-10-CM | POA: Diagnosis not present

## 2016-06-06 DIAGNOSIS — Z811 Family history of alcohol abuse and dependence: Secondary | ICD-10-CM

## 2016-06-06 DIAGNOSIS — Z818 Family history of other mental and behavioral disorders: Secondary | ICD-10-CM

## 2016-06-06 MED ORDER — LORAZEPAM 1 MG PO TABS: 1.0000 mg | ORAL_TABLET | Freq: Two times a day (BID) | ORAL | 1 refills | Status: DC | PRN

## 2016-06-06 MED ORDER — BUSPIRONE HCL 15 MG PO TABS: 15.0000 mg | ORAL_TABLET | Freq: Two times a day (BID) | ORAL | 1 refills | Status: DC

## 2016-06-06 MED ORDER — ARIPIPRAZOLE 5 MG PO TABS: 5.0000 mg | ORAL_TABLET | Freq: Every day | ORAL | 1 refills | Status: DC

## 2016-06-06 MED FILL — ARIPiprazole 5 MG TABS: 5 | 30 days supply | Qty: 30 | Fill #0

## 2016-06-06 MED FILL — LORazepam 1 MG TABS: 1 | 30 days supply | Qty: 60 | Fill #0

## 2016-06-06 NOTE — Progress Notes (Signed)
Patient ID: Matthew [name] Matthew [name], male   DOB: May 08, 1971, 45 y.o.   MRN: [id number] Patient ID: Matthew [name], male   DOB: May 08, 1971, 45 y.o.   MRN: [id number]  [facility] [id number] Progress Note  Matthew KidneyRobert [name] Matthew [name] [id number] 45 y.o.  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 States things at home are good.  Depression is present and he is detached and irritable. He has low motivation and he is not getting work done. He is feeling easily overwhelmed. He feels down, irritative, a little isolative, feels tired and has low motivation and if extreme he may have passive thoughts of death.   States it is usually related to anxiety and stressors. Reports isolation, anhedonia, worthlessness and hopelessness.   2-3 times a month for 5 days he feels up. No change in sleep pattern. Energy is mildly up. He is not really impulsive. Pt reports he is more interactive and jovial. Denies manic and hypomanic symptoms including periods of decreased need for sleep, increased energy, mood lability, impulsivity, FOI, and excessive spending recently. Wife tells him his "ups" are more controlled.   Anxiety is worse. With high anxiety he has some chest pain, racing thoughts and headaches.  States it is constant but can suddenly worsen for no reason. When really anxious it can lead to depression if he can't calm down. He is having random panic attacks. He has been taking Buspar twice a day and notes it helps to decrease the intensity of his anxiety on some days. Pt has taken Ativan a few times and it helped calm him down.   For a few weeks he was feeling paranoid about being left out of work meetings or getting fired. It is a little better now but still continues.   Sleep and appetite are good.   He is taking Lithium and Celexa as prescribed and reports possible SE of diarrhea 1-2 episode in the  morning and none for the rest of day.  Pt states it is manageable.   Suicidal Ideation: No passive thoughts of not caring if he passed away Plan Formed: No Patient has means to carry out plan: No  Homicidal Ideation: Yes directed towards to anyone who makes him upset Plan Formed: No Patient has means to carry out plan: No  Review of Systems: Psychiatric: Agitation: No Hallucination: No Depressed Mood: Yes Insomnia: No  Hypersomnia: No Altered Concentration: No Feels Worthless: No only when depressed Grandiose Ideas: No  Belief In Special Powers: No New/Increased Substance Abuse: No Compulsions: No   Review of Systems  Constitutional: Negative for chills, fever and weight loss.  HENT: Negative for ear pain, hearing loss, sore throat and tinnitus.   Eyes: Negative for blurred vision, double vision and pain.  Respiratory: Negative for cough, sputum production and shortness of breath.   Cardiovascular: Negative for chest pain, palpitations and leg swelling.  Gastrointestinal: Positive for diarrhea. Negative for heartburn, nausea and vomiting.  Musculoskeletal: Positive for back pain. Negative for joint pain and neck pain.  Skin: Negative for itching and rash.  Neurological: Negative for dizziness, tremors, sensory change, seizures, loss of consciousness and headaches.  Psychiatric/Behavioral: Positive for depression. Negative for hallucinations, memory loss, substance abuse and suicidal ideas. The patient is nervous/anxious. The patient does not have insomnia.      Past Medical Family, Social History: [place] in [place] with his wife and 4  kids. 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 Pt reports that he has been smoking Cigarettes.  He has a 20.00 pack-year smoking history. He has never used smokeless tobacco. He reports that he does not drink alcohol or use drugs.  Family History  Problem Relation Age of Onset  . Hypertension Other   .  Cancer Neg Hx   . Diabetes Neg Hx   . Heart disease Neg Hx   . Hyperlipidemia Neg Hx   . Stroke Neg Hx   . Suicidality Neg Hx   . Anxiety disorder Mother   . Alcohol abuse Father   . Drug abuse Father   . Bipolar disorder Sister   . Alcohol abuse Brother   . Drug abuse Brother   . Bipolar disorder Paternal Aunt   . Schizophrenia Paternal Aunt    Past Medical History:  Diagnosis Date  . Acute [name] failure (HCC) 01/2012  . Anxiety   . Bipolar disorder (HCC)    hypomania  . COPD (chronic obstructive pulmonary disease) (HCC)   . Depression   . Low back pain     Outpatient Encounter Prescriptions as of 06/06/2016  Medication Sig  . baclofen (LIORESAL) 10 MG tablet Take 10 mg by mouth as needed for muscle spasms.  . busPIRone (BUSPAR) 15 MG tablet TAKE 1 TABLET BY MOUTH 2 TIMES DAILY.  . celecoxib (CELEBREX) 100 MG capsule Take 100 mg by mouth as needed for mild pain.   . citalopram (CELEXA) 40 MG tablet TAKE 1 TABLET BY MOUTH DAILY.  . cyclobenzaprine (FLEXERIL) 10 MG tablet Take 1 tablet (10 mg total) by mouth 3 (three) times daily as needed. (Patient taking differently: Take 10 mg by mouth 3 (three) times daily as needed for muscle spasms. )  . gabapentin (NEURONTIN) 300 MG capsule Take 300 mg by mouth daily as needed. For pain  . HYDROcodone-acetaminophen (NORCO) 10-325 MG tablet Take 1-2 tablets by mouth every 6 (six) hours as needed.  . lithium carbonate (LITHOBID) 300 MG CR tablet Take 4 tablets (1,200 mg total) by mouth at bedtime.  Marland Kitchen LORazepam (ATIVAN) 1 MG tablet Take 1 tablet (1 mg total) by mouth daily as needed for anxiety.  . tadalafil (CIALIS) 5 MG tablet Take 5 mg by mouth daily as needed for erectile dysfunction.   No facility-administered encounter medications on file as of 06/06/2016.     Past Psychiatric History/Hospitalization(s): Anxiety: Yes Bipolar Disorder: No Depression: Yes Mania: No Psychosis: No Schizophrenia: No Personality Disorder:  No Hospitalization for psychiatric illness: No History of Electroconvulsive Shock Therapy: No Prior Suicide Attempts: No  Physical Exam: Constitutional:  BP 128/80   Pulse 87   Ht 6\' 5"  (1.956 m)   Wt 237 lb (107.5 kg)   BMI 28.10 kg/m   General Appearance: alert, oriented, no acute distress and well nourished  Musculoskeletal: Strength & Muscle Tone: within normal limits Gait & Station: normal Patient leans: straight  Mental Status Examination/Evaluation: Objective: Attitude: Calm and cooperative  Appearance: Well Groomed, appears to be stated age  Eye Contact::  Minimal  Speech:  Clear and Coherent and Normal Rate  Volume:  Normal  Mood: depressed, anxious, irritable  Affect:  Blunt and Congruent  Thought Process:  Goal Directed, Linear and Logical  Orientation:  Full (Time, Place, and Person)  Thought Content:  Negative  Suicidal Thoughts:  No passive thoughts  Homicidal Thoughts:  Yes.  without intent/plan  Judgement:  Fair  Insight:  Fair  Concentration: good  Memory: Immediate-fair Recent-fair Remote-fair  Recall: fair  Language: fair  Gait and Station: normal  [employer] of Knowledge: average  Psychomotor Activity:  Normal  Akathisia:  No  Handed:  Right  AIMS (if indicated):  n/a      Assessment: AXIS I  Bipolar II current episode unspecified, Panic disorder without agorophobia; GAD  AXIS II  Deferred      Treatment Plan/Recommendations:  Plan of Care:  Medication management with supportive therapy. Risks/benefits and SE of the medication discussed. Pt verbalized understanding and verbal consent obtained for treatment.  Affirm with the patient that the medications are taken as ordered. Patient expressed understanding of how their medications were to be used.    Laboratory:Lithium level, CMP with Bun/Creatinine, TSH      Psychotherapy: Therapy: brief supportive therapy provided. Discussed psychosocial stressors in detail.  - monitoring  weight (lno changed since last visit) and encouraged healthy diet -recommended light box therapy - discussed Asperger's. Pt may be on the spectrum but overall is maintaining healthy relationships. At this time no therapy is recommended as his quality of life is fine.   Medications: Lithobid 1500mg  po qHS for mood stabilization due to GI SE.  D/c Celexa   -start trial of Abilify 5mg  po qD for mood lability  Buspar 15mg  po BID for anxiety -Ativan 1mg  po BID prn anxiety  Routine PRN Medications: yes  Consultations: encouraged pt to f/up with pcp as needed  Declined therapy referral  Safety Concerns: Pt denies SI and is at an acute low risk for suicide.Patient told to call clinic if any problems occur. Patient advised to go to ER if they should develop SI/HI, side effects, or if symptoms worsen. Has crisis numbers to call if needed. Pt verbalized understanding.\  Other: F/up in 3 weeks or sooner if needed     [name], MD 06/06/2016

## 2016-06-12 DIAGNOSIS — M5417 Radiculopathy, lumbosacral region: Secondary | ICD-10-CM | POA: Diagnosis not present

## 2016-06-12 DIAGNOSIS — M549 Dorsalgia, unspecified: Secondary | ICD-10-CM | POA: Diagnosis not present

## 2016-06-12 DIAGNOSIS — M5126 Other intervertebral disc displacement, lumbar region: Secondary | ICD-10-CM | POA: Diagnosis not present

## 2016-06-12 DIAGNOSIS — M791 Myalgia: Secondary | ICD-10-CM | POA: Diagnosis not present

## 2016-06-12 MED FILL — BACLOFEN 10 MG TABLET: 10 | 30 days supply | Qty: 90 | Fill #0

## 2016-06-12 MED FILL — GABAPENTIN 300 MG CAPSULE: 300 | 30 days supply | Qty: 60 | Fill #0

## 2016-06-21 MED FILL — LITHIUM CARBONATE ER 300 MG: 300 | 30 days supply | Qty: 120 | Fill #1

## 2016-06-26 DIAGNOSIS — M5417 Radiculopathy, lumbosacral region: Secondary | ICD-10-CM | POA: Diagnosis not present

## 2016-07-04 ENCOUNTER — Ambulatory Visit (HOSPITAL_COMMUNITY): Payer: Self-pay | Admitting: Psychiatry

## 2016-07-04 ENCOUNTER — Telehealth (HOSPITAL_COMMUNITY): Payer: Self-pay

## 2016-07-04 NOTE — Telephone Encounter (Signed)
Medication problem - Patient showed up for originally scheduled follow up appointment this evening and was not aware appt. was canceled due to office move. Patient not doing well with recent medication change and stated he had been waiting for this appointment due to problems with feeling irritable, sluggish and jittery with new Abilify medication.  Patient stated "something has to be done" and presented with flat affect and noted labile mood.  Patient stated he just has not been feeling himself since starting medication and states his "tongue feels heavy".   No edema noted or symptoms of TD but patient with noted possible akathisia.  Apologized to patient that he was not effectively contacted about canceled appointment as patient reported he had gotten a call stating to call back [facility] receptionist but with no other identifying information or what the call back was in reference to for the call.  Agreed to send message to Dr. Michae KavaAgarwal that Jeri Modenaita Clark, case manager had already reported to patient she called would personally call patient back on tomorrow.  Agreed to see if this could occur today since patient is having such difficulty with the medication and patient agreed.  Requested patient call this nurse back in the coming day if not contacted by then and again apologized for the changed appointment.  Message sent to Dr. Michae KavaAgarwal and called to provide information as she stated plans to call later this date.

## 2016-07-22 MED FILL — LITHIUM CARBONATE ER 300 MG: 300 | 30 days supply | Qty: 120 | Fill #2

## 2016-07-22 MED FILL — busPIRone HCL 15 MG TABS: 15 | 30 days supply | Qty: 60 | Fill #0

## 2016-08-01 ENCOUNTER — Encounter (HOSPITAL_COMMUNITY): Payer: Self-pay | Admitting: Psychiatry

## 2016-08-01 ENCOUNTER — Ambulatory Visit (INDEPENDENT_AMBULATORY_CARE_PROVIDER_SITE_OTHER): Payer: 59 | Admitting: Psychiatry

## 2016-08-01 DIAGNOSIS — Z811 Family history of alcohol abuse and dependence: Secondary | ICD-10-CM

## 2016-08-01 DIAGNOSIS — F410 Panic disorder [episodic paroxysmal anxiety] without agoraphobia: Secondary | ICD-10-CM

## 2016-08-01 DIAGNOSIS — Z818 Family history of other mental and behavioral disorders: Secondary | ICD-10-CM

## 2016-08-01 DIAGNOSIS — Z8489 Family history of other specified conditions: Secondary | ICD-10-CM

## 2016-08-01 DIAGNOSIS — F3181 Bipolar II disorder: Secondary | ICD-10-CM

## 2016-08-01 DIAGNOSIS — F411 Generalized anxiety disorder: Secondary | ICD-10-CM

## 2016-08-01 DIAGNOSIS — Z79899 Other long term (current) drug therapy: Secondary | ICD-10-CM

## 2016-08-01 DIAGNOSIS — R45850 Homicidal ideations: Secondary | ICD-10-CM

## 2016-08-01 DIAGNOSIS — R45851 Suicidal ideations: Secondary | ICD-10-CM

## 2016-08-01 MED ORDER — LITHIUM CARBONATE ER 300 MG PO TBCR: 1200.0000 mg | EXTENDED_RELEASE_TABLET | Freq: Every day | ORAL | 2 refills | Status: DC

## 2016-08-01 MED ORDER — BUSPIRONE HCL 15 MG PO TABS: 15.0000 mg | ORAL_TABLET | Freq: Three times a day (TID) | ORAL | 2 refills | Status: DC

## 2016-08-01 MED ORDER — QUETIAPINE FUMARATE 100 MG PO TABS: 100.0000 mg | ORAL_TABLET | Freq: Every day | ORAL | 2 refills | Status: DC

## 2016-08-01 MED ORDER — LORAZEPAM 1 MG PO TABS
1.0000 mg | ORAL_TABLET | Freq: Two times a day (BID) | ORAL | 2 refills | Status: DC | PRN
Start: 2016-08-01 — End: 2016-08-29

## 2016-08-01 MED FILL — QUETIAPINE FUMARATE 100 MG: 100 | 30 days supply | Qty: 30 | Fill #0

## 2016-08-01 MED FILL — LORazepam 1 MG TABS: 1 | 30 days supply | Qty: 60 | Fill #0

## 2016-08-01 NOTE — Progress Notes (Signed)
Patient ID: Matthew [name], male   DOB: 13-May-1971, 45 y.o.   MRN: [id number] Patient ID: Matthew [name], male   DOB: Dec 28, 1970, 45 y.o.   MRN: [id number]  [facility] [id number] Progress Note  [name] [id number] 45 y.o.  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 States things at home are "alright, not super".  States anger is a little better but is still present since meds were changed.   States anxiety is remains high. Anxiety prevents him from doing much. He feels he is not functioning very well at home, work and school. Pt states he is getting by but is overwhelmed States Buspar is helping some and that is why he is getting by.  He forgot about Ativan so has not been taking it.  Pt reports constant racing thoughts, has sense of impending doom and chest pain.   Depression is getting worse and he is detached and irritable. He has low motivation and he is not getting work done. He is feeling easily overwhelmed. He feels down, irritative, a little isolative, feels tired and has low motivation and he has daily passive thoughts of death.   States it is usually related to anxiety and stressors. Reports isolation, anhedonia, worthlessness and hopelessness.   Denies manic and hypomanic symptoms including periods of decreased need for sleep, increased energy, mood lability, impulsivity, FOI, and excessive spending.  Sleep is poor. He is getting up around 4am. Energy is generally low. Appetite are good.   He is taking Lithium and Buspar.   Suicidal Ideation: No passive thoughts of not caring if he passed away Plan Formed: No Patient has means to carry out plan: No  Homicidal Ideation: No directed towards to a few people who makes him upset Plan Formed: No and has no intent to hurt/kill anyone  Patient has means to carry out plan: No  Review of Systems: Psychiatric: Agitation: Yes Hallucination: No Depressed Mood: Yes Insomnia: Yes  Hypersomnia: No Altered Concentration: No Feels Worthless: Yes only when depressed Grandiose Ideas: No  Belief In Special Powers: No New/Increased Substance Abuse: No Compulsions: No   Review of Systems  Respiratory: Negative for cough, sputum production and shortness of breath.   Gastrointestinal: Negative for abdominal pain, diarrhea, heartburn, nausea and vomiting.  Musculoskeletal: Positive for back pain. Negative for joint pain and neck pain.  Neurological: Positive for headaches. Negative for dizziness, tremors, sensory change, seizures and loss of consciousness.  Psychiatric/Behavioral: Positive for depression and suicidal ideas. Negative for hallucinations, memory loss and substance abuse. The patient is nervous/anxious and has insomnia.      Past Medical Family, Social History: I have reviewed the patient's medical history on 08/01/2016 in detail and updated the computerized patient record. Lives in [place] with his wife and 4 kids. 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 Pt reports that he has been smoking Cigarettes.  He has a 20.00 pack-year smoking history. He has never used smokeless tobacco. He reports that he does not drink alcohol or use drugs.  Family History  Problem Relation Age of Onset  . Hypertension Other   . Anxiety disorder Mother   . Alcohol abuse Father   . Drug abuse Father   . Bipolar disorder Sister   .  Alcohol abuse Brother   . Drug abuse Brother   . Bipolar disorder Paternal Aunt   . Schizophrenia Paternal Aunt   . Cancer Neg Hx   . Diabetes Neg Hx   . Heart disease Neg Hx   . Hyperlipidemia Neg Hx   . Stroke Neg Hx   . Suicidality Neg Hx    Past Medical History:  Diagnosis Date  . Acute kidney failure (HCC) 01/2012  . Anxiety   . Bipolar disorder  (HCC)    hypomania  . COPD (chronic obstructive pulmonary disease) (HCC)   . Depression   . Low back pain     Outpatient Encounter Prescriptions as of 08/01/2016  Medication Sig  . baclofen (LIORESAL) 10 MG tablet Take 10 mg by mouth as needed for muscle spasms.  . busPIRone (BUSPAR) 15 MG tablet Take 1 tablet (15 mg total) by mouth 2 (two) times daily.  . celecoxib (CELEBREX) 100 MG capsule Take 100 mg by mouth as needed for mild pain.   . cyclobenzaprine (FLEXERIL) 10 MG tablet Take 1 tablet (10 mg total) by mouth 3 (three) times daily as needed. (Patient taking differently: Take 10 mg by mouth 3 (three) times daily as needed for muscle spasms. )  . gabapentin (NEURONTIN) 300 MG capsule Take 300 mg by mouth daily as needed. For pain  . HYDROcodone-acetaminophen (NORCO) 10-325 MG tablet Take 1-2 tablets by mouth every 6 (six) hours as needed.  . lithium carbonate (LITHOBID) 300 MG CR tablet Take 4 tablets (1,200 mg total) by mouth at bedtime.  . tadalafil (CIALIS) 5 MG tablet Take 5 mg by mouth daily as needed for erectile dysfunction.  . ARIPiprazole (ABILIFY) 5 MG tablet Take 1 tablet (5 mg total) by mouth daily. (Patient not taking: Reported on 08/01/2016)  . LORazepam (ATIVAN) 1 MG tablet Take 1 tablet (1 mg total) by mouth 2 (two) times daily as needed for anxiety. (Patient not taking: Reported on 08/01/2016)   No facility-administered encounter medications on file as of 08/01/2016.     Past Psychiatric History/Hospitalization(s): Anxiety: Yes Bipolar Disorder: No Depression: Yes Mania: No Psychosis: No Schizophrenia: No Personality Disorder: No Hospitalization for psychiatric illness: No History of Electroconvulsive Shock Therapy: No Prior Suicide Attempts: No  Physical Exam: Constitutional:  BP 132/78   Pulse (!) 104   Ht 6\' 5"  (1.956 m)   Wt 234 lb 6.4 oz (106.3 kg)   BMI 27.80 kg/m   General Appearance: alert, oriented, no acute distress and well  nourished  Musculoskeletal: Strength & Muscle Tone: within normal limits Gait & Station: normal Patient leans: straight  Mental Status Examination/Evaluation: reviewed 08/01/2016 and same as previous visits except as noted  Objective: Attitude: Calm and cooperative  Appearance: Well Groomed, appears to be stated age  Eye Contact::  Minimal  Speech:  Clear and Coherent and Normal Rate  Volume:  Normal  Mood: depressed, anxious, irritable  Affect:  Blunt and Congruent  Thought Process:  Goal Directed, Linear and Logical  Orientation:  Full (Time, Place, and Person)  Thought Content:  Negative  Suicidal Thoughts:  Yes.  without intent/plan passive thoughts  Homicidal Thoughts:  Yes.  without intent/plan  Judgement:  Fair  Insight:  Fair  Concentration: good  Memory: Immediate-fair Recent-fair Remote-fair  Recall: fair  Language: fair  Gait and Station: normal  [employer] of Knowledge: average  Psychomotor Activity:  Normal  Akathisia:  No  Handed:  Right  AIMS (if indicated):  n/a  Assessment: AXIS I  Bipolar II current episode unspecified, Panic disorder without agorophobia; GAD  AXIS II  Deferred      Treatment Plan/Recommendations: reviewed 08/01/2016 and same as previous visits except as noted  Plan of Care:  Medication management with supportive therapy. Risks/benefits and SE of the medication discussed. Pt verbalized understanding and verbal consent obtained for treatment.  Affirm with the patient that the medications are taken as ordered. Patient expressed understanding of how their medications were to be used.    Laboratory: 05/09/2016 Lithium level 0.5, CMP WNL except glu 122, TSH 1.54  EKG 12/22/2015 QTc 453, NSR    Psychotherapy: Therapy: brief supportive therapy provided. Discussed psychosocial stressors in detail.  - monitoring weight (lno changed since last visit) and encouraged healthy diet -recommended light box therapy - discussed Asperger's.  Pt may be on the spectrum but overall is maintaining healthy relationships. At this time no therapy is recommended as his quality of life is fine.   Medications: Lithobid 1200mg  po qHS for mood stabilization due to GI SE.  -start trial of Seroquel 100mg  po qHS for mood lability  -increase Buspar 15mg  po TID for anxiety -Ativan 1mg  po BID prn anxiety  Routine PRN Medications: yes  Consultations: encouraged pt to f/up with pcp as needed  Declined therapy referral  Safety Concerns: Pt denies SI and is at an acute low risk for suicide.Patient told to call clinic if any problems occur. Patient advised to go to ER if they should develop SI/HI, side effects, or if symptoms worsen. Has crisis numbers to call if needed. Pt verbalized understanding.\  Other: F/up in 4 weeks or sooner if needed     Oletta DarterSalina Clare Casto, MD 08/01/2016

## 2016-08-09 DIAGNOSIS — H5213 Myopia, bilateral: Secondary | ICD-10-CM | POA: Diagnosis not present

## 2016-08-26 MED FILL — CYCLOBENZAPRINE 10 MG TAB: 10 | 30 days supply | Qty: 60 | Fill #3

## 2016-08-26 MED FILL — LITHIUM CARBONATE ER 300 MG: 300 | 30 days supply | Qty: 120 | Fill #0

## 2016-08-26 MED FILL — busPIRone HCL 15 MG TABS: 15 | 30 days supply | Qty: 90 | Fill #0

## 2016-08-26 MED FILL — BACLOFEN 10 MG TABLET: 10 | 30 days supply | Qty: 90 | Fill #1

## 2016-08-29 ENCOUNTER — Ambulatory Visit (INDEPENDENT_AMBULATORY_CARE_PROVIDER_SITE_OTHER): Payer: 59 | Admitting: Psychiatry

## 2016-08-29 ENCOUNTER — Encounter (HOSPITAL_COMMUNITY): Payer: Self-pay | Admitting: Psychiatry

## 2016-08-29 VITALS — BP 122/84 | HR 100 | Ht 77.0 in | Wt 234.0 lb

## 2016-08-29 DIAGNOSIS — F410 Panic disorder [episodic paroxysmal anxiety] without agoraphobia: Secondary | ICD-10-CM

## 2016-08-29 DIAGNOSIS — F3181 Bipolar II disorder: Secondary | ICD-10-CM

## 2016-08-29 DIAGNOSIS — Z79899 Other long term (current) drug therapy: Secondary | ICD-10-CM

## 2016-08-29 DIAGNOSIS — R45851 Suicidal ideations: Secondary | ICD-10-CM

## 2016-08-29 DIAGNOSIS — F411 Generalized anxiety disorder: Secondary | ICD-10-CM | POA: Diagnosis not present

## 2016-08-29 DIAGNOSIS — Z811 Family history of alcohol abuse and dependence: Secondary | ICD-10-CM

## 2016-08-29 DIAGNOSIS — Z813 Family history of other psychoactive substance abuse and dependence: Secondary | ICD-10-CM

## 2016-08-29 DIAGNOSIS — R45850 Homicidal ideations: Secondary | ICD-10-CM

## 2016-08-29 DIAGNOSIS — Z8249 Family history of ischemic heart disease and other diseases of the circulatory system: Secondary | ICD-10-CM

## 2016-08-29 MED ORDER — BUSPIRONE HCL 15 MG PO TABS: 15.0000 mg | ORAL_TABLET | Freq: Three times a day (TID) | ORAL | 2 refills | Status: DC

## 2016-08-29 MED ORDER — LORAZEPAM 1 MG PO TABS: 1.0000 mg | ORAL_TABLET | Freq: Two times a day (BID) | ORAL | 2 refills | Status: DC | PRN

## 2016-08-29 MED ORDER — LITHIUM CARBONATE ER 300 MG PO TBCR: 1200.0000 mg | EXTENDED_RELEASE_TABLET | Freq: Every day | ORAL | 2 refills | Status: DC

## 2016-08-29 NOTE — Progress Notes (Signed)
Patient ID: Matthew [name], male   DOB: 1970/11/18, 46 y.o.   MRN: [id number] Patient ID: Matthew [name], male   DOB: 09/29/1970, 46 y.o.   MRN: [id number]  [facility] [id number] Progress Note  Matthew [name] [id number] 46 y.o.  08/29/2016 9:50 AM  Chief Complaint: "I stopped the Seroquel"  History of Present Illness: reviewed 08/29/16 and same as previous visits except as noted  States the last couple of weeks have been better.   He is not as anxious or angry. Pt is less frustrated and more calmer. Pt is tolerating the stress better. Wife tells him he is calmed and better.   Pt stopped Seroquel due to daytime excessive fatigue.   He is not so paranoid about losing his job anymore.   Denies manic and hypomanic symptoms including periods of decreased need for sleep, increased energy, mood lability, grandiose, impulsivity, FOI, and excessive spending.  Anxiety is much better. He is not letting everything bother him like before. Denies panic attacks. He takes Ativan before bedtime.   Pt denies depression. Denies anhedonia, isolation, crying spells, low motivation, poor hygiene, worthlessness and hopelessness. Denies SI/HI.  Sleep is better and he is sleeping thru the night.  Energy is generally ok. Appetite are good.   He is taking Lithium and Buspar.   Suicidal Ideation: No no longer having passive thoughts of death Plan Formed: No Patient has means to carry out plan: No  Homicidal Ideation: No  Plan Formed: No  Patient has means to carry out plan: No  Review of Systems: Psychiatric: Agitation: Yes Hallucination: No Depressed Mood: Yes Insomnia: Yes  Hypersomnia: No Altered Concentration: No Feels Worthless: Yes only when depressed Grandiose Ideas: No  Belief In Special Powers: No New/Increased Substance Abuse: No Compulsions: No   Review of Systems  Constitutional: Negative for chills, fever and malaise/fatigue.  Neurological: Negative  for dizziness, tremors, seizures, loss of consciousness, weakness and headaches.  Psychiatric/Behavioral: Negative for depression, hallucinations, memory loss, substance abuse and suicidal ideas. The patient is not nervous/anxious and does not have insomnia.      Past Medical Family, Social History: reviewed information below with patient on 08/29/16 and same as previous visits except as noted Lives in [place] with his wife and 4 kids. 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 Pt reports that he has been smoking Cigarettes.  He has a 20.00 pack-year smoking history. He has never used smokeless tobacco. He reports that he does not drink alcohol or use drugs.  Family History  Problem Relation Age of Onset  . Hypertension Other   . Anxiety disorder Mother   . Alcohol abuse Father   . Drug abuse Father   . Bipolar disorder Sister   . Alcohol abuse Brother   . Drug abuse Brother   . Bipolar disorder Paternal Aunt   . Schizophrenia Paternal Aunt   . Cancer Neg Hx   . Diabetes Neg Hx   . Heart disease Neg Hx   . Hyperlipidemia Neg Hx   . Stroke Neg Hx   . Suicidality Neg Hx    Past Medical History:  Diagnosis Date  . Acute kidney failure (HCC) 01/2012  . Anxiety   . Bipolar disorder (HCC)    hypomania  . COPD (chronic obstructive pulmonary disease) (HCC)   . Depression   . Low back pain     Outpatient Encounter Prescriptions as of 08/29/2016  Medication Sig  . baclofen (LIORESAL) 10 MG tablet Take 10 mg by mouth as needed for muscle spasms.  . busPIRone (BUSPAR) 15 MG tablet Take 1 tablet (15 mg total) by mouth 3 (three) times daily.  . celecoxib (CELEBREX) 100 MG capsule Take 100 mg by mouth as needed for mild pain.   . cyclobenzaprine (FLEXERIL) 10 MG tablet Take 1 tablet (10 mg total) by mouth 3 (three) times daily as needed. (Patient taking differently: Take 10 mg by mouth 3 (three) times daily as needed for muscle spasms. )  .  gabapentin (NEURONTIN) 300 MG capsule Take 300 mg by mouth daily as needed. For pain  . HYDROcodone-acetaminophen (NORCO) 10-325 MG tablet Take 1-2 tablets by mouth every 6 (six) hours as needed.  . lithium carbonate (LITHOBID) 300 MG CR tablet Take 4 tablets (1,200 mg total) by mouth at bedtime.  Marland Kitchen LORazepam (ATIVAN) 1 MG tablet Take 1 tablet (1 mg total) by mouth 2 (two) times daily as needed for anxiety.  . tadalafil (CIALIS) 5 MG tablet Take 5 mg by mouth daily as needed for erectile dysfunction.  . QUEtiapine (SEROQUEL) 100 MG tablet Take 1 tablet (100 mg total) by mouth at bedtime. (Patient not taking: Reported on 08/29/2016)   No facility-administered encounter medications on file as of 08/29/2016.     Past Psychiatric History/Hospitalization(s): Anxiety: Yes Bipolar Disorder: No Depression: Yes Mania: No Psychosis: No Schizophrenia: No Personality Disorder: No Hospitalization for psychiatric illness: No History of Electroconvulsive Shock Therapy: No Prior Suicide Attempts: No  Physical Exam: Constitutional:  BP 122/84   Pulse 100   Ht 6\' 5"  (1.956 m)   Wt 234 lb (106.1 kg)   BMI 27.75 kg/m   General Appearance: alert, oriented, no acute distress and well nourished  Musculoskeletal: Strength & Muscle Tone: within normal limits Gait & Station: normal Patient leans: straight  Mental Status Examination/Evaluation: reviewed 08/29/16  and same as previous visits except as noted  Objective: Attitude: Calm and cooperative  Appearance: Well Groomed, appears to be stated age  Eye Contact::  Minimal  Speech:  Clear and Coherent and Normal Rate  Volume:  Normal  Mood: euthymic  Affect:  Blunt and Congruent  Thought Process:  Goal Directed, Linear and Logical  Orientation:  Full (Time, Place, and Person)  Thought Content:  Negative  Suicidal Thoughts:  Yes.  without intent/plan passive thoughts  Homicidal Thoughts:  Yes.  without intent/plan  Judgement:  Fair  Insight:   Fair  Concentration: good  Memory: Immediate-fair Recent-fair Remote-fair  Recall: fair  Language: fair  Gait and Station: normal  [employer] of Knowledge: average  Psychomotor Activity:  Normal  Akathisia:  No  Handed:  Right  AIMS (if indicated):  n/a      Assessment: AXIS I  Bipolar II current episode unspecified, Panic disorder without agorophobia; GAD  AXIS II  Deferred      Treatment Plan/Recommendations: reviewed 08/29/16 and same as previous visits except as noted  Plan of Care:  Medication management with supportive therapy. Risks/benefits and SE of the medication discussed. Pt verbalized understanding and verbal consent obtained for treatment.  Affirm with the patient that the medications are taken as ordered. Patient expressed understanding of how their medications were to be used.    Laboratory:  EKG 12/22/2015 QTc 453, NSR  Order Lithium level, CMP with Bun/Creatinine, TSH     Psychotherapy: Therapy: brief supportive therapy provided. Discussed psychosocial stressors in detail.  - monitoring weight (no changed  since last visit) and encouraged healthy diet -recommended light box therapy - discussed Asperger's. Pt may be on the spectrum but overall is maintaining healthy relationships. At this time no therapy is recommended as his quality of life is fine.   Medications: Lithobid 1200mg  po qHS for mood stabilization due to GI SE.  -d/c Seroquel due to SE  -Buspar 15mg  po TID for anxiety -Ativan 1mg  po BID prn anxiety  Routine PRN Medications: yes  Consultations: encouraged pt to f/up with pcp as needed  Declined therapy referral  Safety Concerns: Pt denies SI and is at an acute low risk for suicide.Patient told to call clinic if any problems occur. Patient advised to go to ER if they should develop SI/HI, side effects, or if symptoms worsen. Has crisis numbers to call if needed. Pt verbalized understanding.\  Other: F/up in 8 weeks or sooner if needed      Oletta DarterSalina Shermika Balthaser, MD 08/29/2016

## 2016-08-30 MED FILL — LORazepam 1 MG TABS: 1 | 30 days supply | Qty: 60 | Fill #0

## 2016-09-27 MED FILL — LITHIUM CARBONATE ER 300 MG: 300 | 30 days supply | Qty: 120 | Fill #1

## 2016-09-27 MED FILL — busPIRone HCL 15 MG TABS: 15 | 30 days supply | Qty: 90 | Fill #1

## 2016-09-27 MED FILL — LORazepam 1 MG TABS: 1 | 30 days supply | Qty: 60 | Fill #1

## 2016-10-03 ENCOUNTER — Other Ambulatory Visit (HOSPITAL_COMMUNITY): Payer: Self-pay

## 2016-10-03 ENCOUNTER — Telehealth (HOSPITAL_COMMUNITY): Payer: Self-pay

## 2016-10-03 NOTE — Progress Notes (Signed)
Patient is calling to let you know that he will be restarting the Seroquel. Patient says that he was placed on Seroquel for symptoms, had stopped taking it, but now the symptoms are back. Patient said that he would like to restart and he has a follow up with you in March - he said he would discuss then. Please review and advise, thank you 

## 2016-10-03 NOTE — Telephone Encounter (Signed)
Yes I am ok with him restarting Seroquel. Please let him know about crisis center and ED if needed. We can also try to scheduled him to earlier appt if needed.

## 2016-10-03 NOTE — Telephone Encounter (Signed)
Called the patient back and lvm letting him know

## 2016-10-03 NOTE — Telephone Encounter (Signed)
Patient is calling to let you know that he will be restarting the Seroquel. Patient says that he was placed on Seroquel for symptoms, had stopped taking it, but now the symptoms are back. Patient said that he would like to restart and he has a follow up with you in March - he said he would discuss then. Please review and advise, thank you

## 2016-10-25 MED FILL — busPIRone HCL 15 MG TABS: 15 | 30 days supply | Qty: 90 | Fill #2

## 2016-10-25 MED FILL — LORazepam 1 MG TABS: 1 | 30 days supply | Qty: 60 | Fill #1

## 2016-10-25 MED FILL — QUETIAPINE FUMARATE 100 MG: 100 | 30 days supply | Qty: 30 | Fill #1

## 2016-10-25 MED FILL — LITHIUM CARBONATE ER 300 MG: 300 | 30 days supply | Qty: 120 | Fill #2

## 2016-10-30 DIAGNOSIS — Z79899 Other long term (current) drug therapy: Secondary | ICD-10-CM | POA: Diagnosis not present

## 2016-10-31 ENCOUNTER — Ambulatory Visit (INDEPENDENT_AMBULATORY_CARE_PROVIDER_SITE_OTHER): Payer: 59 | Admitting: Psychiatry

## 2016-10-31 ENCOUNTER — Encounter (HOSPITAL_COMMUNITY): Payer: Self-pay | Admitting: Psychiatry

## 2016-10-31 DIAGNOSIS — F410 Panic disorder [episodic paroxysmal anxiety] without agoraphobia: Secondary | ICD-10-CM | POA: Diagnosis not present

## 2016-10-31 DIAGNOSIS — R45851 Suicidal ideations: Secondary | ICD-10-CM

## 2016-10-31 DIAGNOSIS — F3181 Bipolar II disorder: Secondary | ICD-10-CM | POA: Diagnosis not present

## 2016-10-31 DIAGNOSIS — Z811 Family history of alcohol abuse and dependence: Secondary | ICD-10-CM | POA: Diagnosis not present

## 2016-10-31 DIAGNOSIS — F411 Generalized anxiety disorder: Secondary | ICD-10-CM | POA: Diagnosis not present

## 2016-10-31 DIAGNOSIS — Z813 Family history of other psychoactive substance abuse and dependence: Secondary | ICD-10-CM | POA: Diagnosis not present

## 2016-10-31 DIAGNOSIS — Z818 Family history of other mental and behavioral disorders: Secondary | ICD-10-CM

## 2016-10-31 LAB — COMPREHENSIVE METABOLIC PANEL
ALT: 19 U/L (ref 9–46)
AST: 13 U/L (ref 10–40)
Albumin: 4.4 g/dL (ref 3.6–5.1)
Alkaline Phosphatase: 67 U/L (ref 40–115)
BILIRUBIN TOTAL: 0.4 mg/dL (ref 0.2–1.2)
BUN: 13 mg/dL (ref 7–25)
CHLORIDE: 105 mmol/L (ref 98–110)
CO2: 27 mmol/L (ref 20–31)
CREATININE: 0.86 mg/dL (ref 0.60–1.35)
Calcium: 9.1 mg/dL (ref 8.6–10.3)
Glucose, Bld: 112 mg/dL — ABNORMAL HIGH (ref 65–99)
Potassium: 4.2 mmol/L (ref 3.5–5.3)
SODIUM: 140 mmol/L (ref 135–146)
TOTAL PROTEIN: 7 g/dL (ref 6.1–8.1)

## 2016-10-31 LAB — TSH: TSH: 1.75 mIU/L (ref 0.40–4.50)

## 2016-10-31 LAB — LITHIUM LEVEL: LITHIUM LVL: 0.7 mmol/L — AB (ref 0.80–1.40)

## 2016-10-31 MED ORDER — LITHIUM CARBONATE ER 300 MG PO TBCR: 1200.0000 mg | EXTENDED_RELEASE_TABLET | Freq: Every day | ORAL | 1 refills | Status: DC

## 2016-10-31 MED ORDER — QUETIAPINE FUMARATE 100 MG PO TABS: 100.0000 mg | ORAL_TABLET | Freq: Every evening | ORAL | 1 refills | Status: DC | PRN

## 2016-10-31 MED ORDER — LORAZEPAM 1 MG PO TABS: 1.0000 mg | ORAL_TABLET | Freq: Two times a day (BID) | ORAL | 1 refills | Status: DC | PRN

## 2016-10-31 MED ORDER — CITALOPRAM HYDROBROMIDE 20 MG PO TABS: 20.0000 mg | ORAL_TABLET | Freq: Every day | ORAL | 1 refills | Status: DC

## 2016-10-31 MED ORDER — BUSPIRONE HCL 15 MG PO TABS: 15.0000 mg | ORAL_TABLET | Freq: Three times a day (TID) | ORAL | 1 refills | Status: DC

## 2016-10-31 MED FILL — CITALOPRAM HBR 20 MG TABLET: 20 | 30 days supply | Qty: 30 | Fill #0

## 2016-10-31 NOTE — Progress Notes (Signed)
Patient ID: [name], male   DOB: Jan 08, 1971, 46 y.o.   MRN: [id number] Patient ID: [name], male   DOB: 1970/08/20, 46 y.o.   MRN: [id number]  [facility] [id number] Progress Note  CORDARIUS BENNING [id number] 46 y.o.  10/31/2016 10:16 AM  Chief Complaint: "I restarted the Seroquel about one month ago"  History of Present Illness: reviewed information below 10/31/16 and same as previous visits except as noted  Sometime in Feb for several weeks he having on/off irritability with random HI. States it was like it was in the earlier part of the winter. He decided he needed to restart the Seroquel and it has helped. He is no longer feeling irritable and agitated. States it is hard to get up in the morning and some days he needs a nap but it is not as bad as before.   Things at home and work have improved. He is no longer feeling paranoid.  Pt reports depression occurs several times a week. He feels overwhelmed. Reports anhedonia, low motivation and isolation. He continues to have passive thoughts of death occur several times a week. Randomly he has active thoughts of suicide 2-3x/week. He does not have a plan or intent and the thoughts are usually passing. DeniesHI.  Sleep is good with Seroquel. Energy is low.   Denies manic and hypomanic symptoms including periods of decreased need for sleep, increased energy, mood lability, impulsivity, FOI, and excessive spending.  Anxiety is high. He is always thinking about his responsibilities and feels overwhelmed.   Taking meds as prescribed and denies SE.     Review of Systems: Psychiatric: Agitation: Yes Hallucination: No Depressed Mood: Yes Insomnia: No  Hypersomnia: No Altered Concentration: No Feels Worthless: Yes only when depressed Grandiose Ideas: No  Belief In Special Powers: No New/Increased Substance Abuse: No Compulsions: No   Review of Systems  HENT: Negative for congestion,  nosebleeds, sinus pain, sore throat and tinnitus.   Gastrointestinal: Negative for abdominal pain, heartburn, nausea and vomiting.  Neurological: Negative for dizziness, sensory change, speech change, seizures, loss of consciousness and headaches.  Psychiatric/Behavioral: Positive for depression and suicidal ideas. Negative for hallucinations and substance abuse. The patient is nervous/anxious. The patient does not have insomnia.      Past Medical Family, Social History: reviewed information below with patient on 10/31/16 and same as previous visits except as noted Lives in [place] with his wife and 4 kids. 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 Pt reports that he has been smoking Cigarettes.  He has a 20.00 pack-year smoking history. He has never used smokeless tobacco. He reports that he does not drink alcohol or use drugs.  Family History  Problem Relation Age of Onset  . Hypertension Other   . Anxiety disorder Mother   . Alcohol abuse Father   . Drug abuse Father   . Bipolar disorder Sister   . Alcohol abuse Brother   . Drug abuse Brother   . Bipolar disorder Paternal Aunt   . Schizophrenia Paternal Aunt   . Cancer Neg Hx   . Diabetes Neg Hx   . Heart disease Neg Hx   . Hyperlipidemia Neg Hx   . Stroke Neg Hx   . Suicidality Neg Hx    Past Medical History:  Diagnosis Date  . Acute kidney failure (HCC) 01/2012  . Anxiety   . Bipolar disorder (HCC)  hypomania  . COPD (chronic obstructive pulmonary disease) (HCC)   . Depression   . Low back pain     Outpatient Encounter Prescriptions as of 10/31/2016  Medication Sig  . baclofen (LIORESAL) 10 MG tablet Take 10 mg by mouth as needed for muscle spasms.  . busPIRone (BUSPAR) 15 MG tablet Take 1 tablet (15 mg total) by mouth 3 (three) times daily.  . celecoxib (CELEBREX) 100 MG capsule Take 100 mg by mouth as needed for mild pain.   . cyclobenzaprine (FLEXERIL) 10 MG tablet Take  1 tablet (10 mg total) by mouth 3 (three) times daily as needed. (Patient taking differently: Take 10 mg by mouth 3 (three) times daily as needed for muscle spasms. )  . gabapentin (NEURONTIN) 300 MG capsule Take 300 mg by mouth daily as needed. For pain  . HYDROcodone-acetaminophen (NORCO) 10-325 MG tablet Take 1-2 tablets by mouth every 6 (six) hours as needed.  . lithium carbonate (LITHOBID) 300 MG CR tablet Take 4 tablets (1,200 mg total) by mouth at bedtime.  Marland Kitchen. LORazepam (ATIVAN) 1 MG tablet Take 1 tablet (1 mg total) by mouth 2 (two) times daily as needed for anxiety.  Marland Kitchen. QUEtiapine (SEROQUEL) 100 MG tablet Take 100 mg by mouth at bedtime as needed.  . tadalafil (CIALIS) 5 MG tablet Take 5 mg by mouth daily as needed for erectile dysfunction.   No facility-administered encounter medications on file as of 10/31/2016.     Past Psychiatric History/Hospitalization(s): Anxiety: Yes Bipolar Disorder: No Depression: Yes Mania: No Psychosis: No Schizophrenia: No Personality Disorder: No Hospitalization for psychiatric illness: No History of Electroconvulsive Shock Therapy: No Prior Suicide Attempts: No  Physical Exam: Constitutional:  BP 120/78   Pulse 95   Ht 6\' 5"  (1.956 m)   Wt 234 lb (106.1 kg)   BMI 27.75 kg/m   General Appearance: alert, oriented, no acute distress and well nourished  Musculoskeletal: Strength & Muscle Tone: within normal limits Gait & Station: normal Patient leans: straight  Mental Status Examination/Evaluation: reviewed MSE 10/31/16  and same as previous visits except as noted  Objective: Attitude: Calm and cooperative  Appearance: Well Groomed, appears to be stated age  Eye Contact::  Fair  Speech:  Clear and Coherent and Normal Rate  Volume:  Normal  Mood: depressed  Affect:  Blunt and Congruent  Thought Process:  Goal Directed, Linear and Logical  Orientation:  Full (Time, Place, and Person)  Thought Content:  Negative  Suicidal Thoughts:   Yes.  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 Risks/benefits and SE of the medication discussed. Pt verbalized understanding and verbal consent obtained for treatment.  Affirm with the patient that the medications are taken as ordered. Patient expressed understanding of how their medications were to be used.    Laboratory:  EKG 12/22/2015 QTc 453, NSR  Order Lithium level, CMP with Bun/Creatinine,  TSH     Psychotherapy: Therapy: brief supportive therapy provided. Discussed psychosocial stressors in detail.  - monitoring weight (no changed since last visit) and encouraged healthy diet -recommended light box therapy - discussed Asperger's. Pt may be on the spectrum but overall is maintaining healthy relationships. At this time no therapy is recommended as his quality of life is fine.   Medications: Lithobid 1200mg  po qHS for mood stabilization due to GI SE. -start trial of Celexa 20mg  po qD for depression -continue Seroquel 100mg  po qHS for mood augmenation  -Buspar 15mg  po TID for anxiety -Ativan 1mg  po BID prn anxiety    Routine PRN Medications: yes  Consultations: encouraged pt to f/up with pcp as needed  Declined therapy referral  Safety Concerns: Pt denies SI and is at an acute low risk for suicide.Patient told to call clinic if any problems occur. Patient advised to go to ER if  they should develop SI/HI, side effects, or if symptoms worsen. Has crisis numbers to call if needed. Pt verbalized understanding.\  Other: F/up in 6 weeks or sooner if needed     [name], MD 10/31/2016

## 2016-11-26 MED FILL — QUETIAPINE FUMARATE 100 MG: 100 | 30 days supply | Qty: 30 | Fill #2

## 2016-11-26 MED FILL — LITHIUM CARBONATE ER 300 MG: 300 | 30 days supply | Qty: 120 | Fill #0

## 2016-11-26 MED FILL — busPIRone HCL 15 MG TABS: 15 | 30 days supply | Qty: 90 | Fill #0

## 2016-11-26 MED FILL — CYCLOBENZAPRINE 10 MG TAB: 10 | 30 days supply | Qty: 60 | Fill #0

## 2016-11-26 MED FILL — LORazepam 1 MG TABS: 1 | 30 days supply | Qty: 60 | Fill #2

## 2016-11-26 MED FILL — CITALOPRAM HBR 20 MG TABLET: 20 | 30 days supply | Qty: 30 | Fill #1

## 2016-12-06 DIAGNOSIS — J209 Acute bronchitis, unspecified: Secondary | ICD-10-CM | POA: Diagnosis not present

## 2016-12-06 MED FILL — CEFUROXIME AXETIL 500 MG TA: 500 | 14 days supply | Qty: 28 | Fill #0

## 2016-12-06 MED FILL — predniSONE 10 MG TABS: 10 | 12 days supply | Qty: 48 | Fill #0

## 2016-12-06 MED FILL — VENTOLIN HFA 90 MCG INHALER: 108 (90 BAS | 16 days supply | Qty: 18 | Fill #0

## 2016-12-12 ENCOUNTER — Ambulatory Visit (INDEPENDENT_AMBULATORY_CARE_PROVIDER_SITE_OTHER): Payer: 59 | Admitting: Psychiatry

## 2016-12-12 ENCOUNTER — Encounter (HOSPITAL_COMMUNITY): Payer: Self-pay | Admitting: Psychiatry

## 2016-12-12 DIAGNOSIS — Z813 Family history of other psychoactive substance abuse and dependence: Secondary | ICD-10-CM

## 2016-12-12 DIAGNOSIS — F17210 Nicotine dependence, cigarettes, uncomplicated: Secondary | ICD-10-CM | POA: Diagnosis not present

## 2016-12-12 DIAGNOSIS — Z79899 Other long term (current) drug therapy: Secondary | ICD-10-CM | POA: Diagnosis not present

## 2016-12-12 DIAGNOSIS — F3181 Bipolar II disorder: Secondary | ICD-10-CM

## 2016-12-12 DIAGNOSIS — F411 Generalized anxiety disorder: Secondary | ICD-10-CM

## 2016-12-12 DIAGNOSIS — Z818 Family history of other mental and behavioral disorders: Secondary | ICD-10-CM | POA: Diagnosis not present

## 2016-12-12 DIAGNOSIS — F410 Panic disorder [episodic paroxysmal anxiety] without agoraphobia: Secondary | ICD-10-CM

## 2016-12-12 DIAGNOSIS — Z811 Family history of alcohol abuse and dependence: Secondary | ICD-10-CM | POA: Diagnosis not present

## 2016-12-12 MED ORDER — CITALOPRAM HYDROBROMIDE 40 MG PO TABS: 40.0000 mg | ORAL_TABLET | Freq: Every day | ORAL | 2 refills | Status: DC

## 2016-12-12 MED ORDER — LITHIUM CARBONATE ER 300 MG PO TBCR: 1200.0000 mg | EXTENDED_RELEASE_TABLET | Freq: Every day | ORAL | 2 refills | Status: DC

## 2016-12-12 MED ORDER — BUSPIRONE HCL 15 MG PO TABS: 15.0000 mg | ORAL_TABLET | Freq: Three times a day (TID) | ORAL | 2 refills | Status: DC

## 2016-12-12 MED ORDER — LORAZEPAM 1 MG PO TABS: 1.0000 mg | ORAL_TABLET | Freq: Two times a day (BID) | ORAL | 2 refills | Status: DC | PRN

## 2016-12-12 MED ORDER — QUETIAPINE FUMARATE 100 MG PO TABS: 100.0000 mg | ORAL_TABLET | Freq: Every evening | ORAL | 2 refills | Status: DC | PRN

## 2016-12-12 NOTE — Progress Notes (Signed)
BH MD/PA/NP OP Progress Note  12/12/2016 10:30 AM Matthew [name]  MRN:  [id number]  Chief Complaint:  Chief Complaint    Follow-up      HPI: pt reports he is doing better. He is getting along better with his children. There are some marital issues that are ongoing.   Reports depression has decreased. Since the last visit he has had 2 episodes of depression that lasted about 4-6 days. During this time he had passive thoughts of death. Today he denies SI/HI.  Sleep, appetite and energy are good.   Denies manic and hypomanic symptoms including periods of decreased need for sleep, increased energy, mood lability, impulsivity, FOI, and excessive spending.  He denies panic attacks. Anxiety is more situational but has improved with Celexa. He is taking Ativan BID.  Taking meds as prescribed and denies SE.      Visit Diagnosis:    ICD-9-CM ICD-10-CM   1. Panic 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 300.01 F41.0 LORazepam (ATIVAN) 1 MG tablet     citalopram (CELEXA) 40 MG tablet  2. Bipolar 2 disorder (HCC) 296.89 F31.81 QUEtiapine (SEROQUEL) 100 MG tablet     lithium carbonate (LITHOBID) 300 MG CR tablet     citalopram (CELEXA) 40 MG tablet  3. GAD (generalized anxiety disorder) 300.02 F41.1 LORazepam (ATIVAN) 1 MG tablet     citalopram (CELEXA) 40 MG tablet     busPIRone (BUSPAR) 15 MG tablet    Past Psychiatric History:  Anxiety: Yes Bipolar Disorder: No Depression: Yes Mania: No Psychosis: No Schizophrenia: No Personality Disorder: No Hospitalization for psychiatric illness: No History of Electroconvulsive Shock Therapy: No Prior Suicide Attempts: No  Past Medical History:  Past Medical History:  Diagnosis Date  . Acute kidney failure (HCC) 01/2012  . Anxiety   . Bipolar disorder (HCC)    hypomania  . COPD (chronic obstructive pulmonary disease) (HCC)   . Depression   . Low back pain     Past Surgical History:  Procedure Laterality Date  . NO PAST SURGERIES       Family Psychiatric and Medical History: Family History  Problem Relation Age of Onset  . Hypertension Other   . Anxiety disorder Mother   . Alcohol abuse Father   . Drug abuse Father   . Bipolar disorder Sister   . Alcohol abuse Brother   . Drug abuse Brother   . Bipolar disorder Paternal Aunt   . Schizophrenia Paternal Aunt   . Cancer Neg Hx   . Diabetes Neg Hx   . Heart disease Neg Hx   . Hyperlipidemia Neg Hx   . Stroke Neg Hx   . Suicidality Neg Hx     Social History:  Social History   Social History  . Marital status: Married    Spouse name: N/A  . Number of children: N/A  . Years of education: N/A   Social History Main Topics  . Smoking status: Current Every Day Smoker    Packs/day: 1.00    Years: 20.00    Types: Cigarettes  . Smokeless tobacco: Never Used  . Alcohol use No     Comment: 2-3 drinks every month  . Drug use: No  . Sexual activity: Yes    Birth control/ protection: None   Other Topics Concern  . Not on file   Social History Narrative  . No narrative on file    Allergies:  Allergies  Allergen Reactions  . Ciprofloxacin     Acute kidney failure  Metabolic Disorder Labs: Lab Results  Component Value Date   HGBA1C 5.2 02/03/2014   MPG 103 02/03/2014   No results found for: PROLACTIN Lab Results  Component Value Date   CHOL 143 01/21/2011   TRIG 174.0 (H) 01/21/2011   HDL 31.20 (L) 01/21/2011   CHOLHDL 5 01/21/2011   VLDL 34.8 01/21/2011   LDLCALC 77 01/21/2011     Current Medications: Current Outpatient Prescriptions  Medication Sig Dispense Refill  . baclofen (LIORESAL) 10 MG tablet Take 10 mg by mouth as needed for muscle spasms.    . busPIRone (BUSPAR) 15 MG tablet Take 1 tablet (15 mg total) by mouth 3 (three) times daily. 90 tablet 1  . celecoxib (CELEBREX) 100 MG capsule Take 100 mg by mouth as needed for mild pain.     . citalopram (CELEXA) 20 MG tablet Take 1 tablet (20 mg total) by mouth daily. 30 tablet 1   . cyclobenzaprine (FLEXERIL) 10 MG tablet Take 1 tablet (10 mg total) by mouth 3 (three) times daily as needed. (Patient taking differently: Take 10 mg by mouth 3 (three) times daily as needed for muscle spasms. ) 30 tablet 0  . gabapentin (NEURONTIN) 300 MG capsule Take 300 mg by mouth daily as needed. For pain    . HYDROcodone-acetaminophen (NORCO) 10-325 MG tablet Take 1-2 tablets by mouth every 6 (six) hours as needed. 15 tablet 0  . lithium carbonate (LITHOBID) 300 MG CR tablet Take 4 tablets (1,200 mg total) by mouth at bedtime. 120 tablet 1  . LORazepam (ATIVAN) 1 MG tablet Take 1 tablet (1 mg total) by mouth 2 (two) times daily as needed for anxiety. 60 tablet 1  . QUEtiapine (SEROQUEL) 100 MG tablet Take 1 tablet (100 mg total) by mouth at bedtime as needed. 30 tablet 1  . tadalafil (CIALIS) 5 MG tablet Take 5 mg by mouth daily as needed for erectile dysfunction.     No current facility-administered medications for this visit.       Musculoskeletal: Strength & Muscle Tone: within normal limits Gait & Station: normal Patient leans: N/A  Psychiatric Specialty Exam: Review of Systems  Musculoskeletal: Negative for back pain, joint pain and neck pain.  Neurological: Negative for dizziness, sensory change, speech change and headaches.  Psychiatric/Behavioral: Positive for depression. Negative for hallucinations, substance abuse and suicidal ideas. The patient is nervous/anxious. The patient does not have insomnia.     Blood pressure 122/74, pulse (!) 107, height  (1.956 m), weight 224 lb 12.8 oz (102 kg).Body mass index is 26.66 kg/m.  General Appearance: Well Groomed  Eye Contact:  Good  Speech:  Clear and Coherent and Normal Rate  Volume:  Normal  Mood:  Euthymic  Affect:  Blunt  Thought Process:  Goal Directed and Descriptions of Associations: Intact  Orientation:  Full (Time, Place, and Person)  Thought Content: Logical   Suicidal Thoughts:  No  Homicidal Thoughts:   No  Memory:  Immediate;   Good Recent;   Good Remote;   Good  Judgement:  Good  Insight:  Good  Psychomotor Activity:  Normal  Concentration:  Concentration: Good and Attention Span: Good  Recall:  Good  Fund of Knowledge: Good  Language: Good  Akathisia:  No  Handed:  Right  AIMS (if indicated):  AIMS:  Facial and Oral Movements  Muscles of Facial Expression: None, normal  Lips and Perioral Area: None, normal  Jaw: None, normal  Tongue: None, normal Extremity Movements: Upper (arms,  wrists, hands, fingers): None, normal  Lower (legs, knees, ankles, toes): None, normal,  Trunk Movements:  Neck, shoulders, hips: None, normal,  Overall Severity : Severity of abnormal movements (highest score from questions above): None, normal  Incapacitation due to abnormal movements: None, normal  Patient's awareness of abnormal movements (rate only patient's report): No Awareness, Dental Status  Current problems with teeth and/or dentures?: No  Does patient usually wear dentures?: No     Assets:  Communication Skills Desire for Improvement Housing  ADL's:  Intact  Cognition: WNL  Sleep:  good     Treatment Plan Summary:Medication management  Assessment: Bipolar II; Panic 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; GAD   Medication management with supportive therapy. Risks/benefits and SE of the medication discussed. Pt verbalized understanding and verbal consent obtained for treatment.  Affirm with the patient that the medications are taken as ordered. Patient expressed understanding of how their medications were to be used.   Meds: Lithobid  p qHS for Bipolar disorder Increase Celexa to  po qD for depression and anxiety Seroquel  po qHS for Bipolar disorder and insomnia Buspar  po TID for anxiety Ativan  po BID prn anxiety   Labs: 10/30/16 CMP WNL, TSH WNL, Lithium 0.7    Therapy: brief supportive therapy provided. Discussed psychosocial stressors in detail.      Consultations:  None  Pt denies SI and is at an acute low risk for suicide. Patient told to call clinic if any problems occur. Patient advised to go to ER if they should develop SI/HI, side effects, or if symptoms worsen. Has crisis numbers to call if needed. Pt verbalized understanding.  F/up in 2* months or sooner if needed   [name], MD 12/12/2016, 10:30 AM

## 2016-12-27 MED FILL — CYCLOBENZAPRINE 10 MG TAB: 10 | 30 days supply | Qty: 60 | Fill #1

## 2016-12-27 MED FILL — LORazepam 1 MG TABS: 1 | 30 days supply | Qty: 60 | Fill #0

## 2016-12-27 MED FILL — LITHIUM CARBONATE ER 300 MG: 300 | 30 days supply | Qty: 120 | Fill #1

## 2016-12-27 MED FILL — QUETIAPINE FUMARATE 100 MG: 100 | 30 days supply | Qty: 30 | Fill #0

## 2016-12-27 MED FILL — CITALOPRAM HBR 40 MG TABLET: 40 | 30 days supply | Qty: 30 | Fill #0

## 2016-12-27 MED FILL — busPIRone HCL 15 MG TABS: 15 | 30 days supply | Qty: 90 | Fill #1

## 2017-01-23 DIAGNOSIS — M545 Low back pain: Secondary | ICD-10-CM | POA: Diagnosis not present

## 2017-01-23 DIAGNOSIS — M5417 Radiculopathy, lumbosacral region: Secondary | ICD-10-CM | POA: Diagnosis not present

## 2017-01-23 DIAGNOSIS — M5126 Other intervertebral disc displacement, lumbar region: Secondary | ICD-10-CM | POA: Diagnosis not present

## 2017-01-23 MED FILL — CYCLOBENZAPRINE 10 MG TAB: 10 | 30 days supply | Qty: 60 | Fill #0

## 2017-01-23 MED FILL — BACLOFEN 10 MG TABLET: 10 | 30 days supply | Qty: 90 | Fill #0

## 2017-01-24 MED FILL — LITHIUM CARBONATE ER 300 MG: 300 | 30 days supply | Qty: 120 | Fill #0

## 2017-01-24 MED FILL — CITALOPRAM HBR 40 MG TABLET: 40 | 30 days supply | Qty: 30 | Fill #1

## 2017-01-24 MED FILL — busPIRone HCL 15 MG TABS: 15 | 30 days supply | Qty: 90 | Fill #0

## 2017-01-24 MED FILL — LORazepam 1 MG TABS: 1 | 30 days supply | Qty: 60 | Fill #1

## 2017-01-24 MED FILL — QUETIAPINE FUMARATE 100 MG: 100 | 30 days supply | Qty: 30 | Fill #1

## 2017-02-13 ENCOUNTER — Encounter (HOSPITAL_COMMUNITY): Payer: Self-pay | Admitting: Psychiatry

## 2017-02-13 ENCOUNTER — Ambulatory Visit (INDEPENDENT_AMBULATORY_CARE_PROVIDER_SITE_OTHER): Payer: 59 | Admitting: Psychiatry

## 2017-02-13 ENCOUNTER — Other Ambulatory Visit (HOSPITAL_COMMUNITY): Payer: Self-pay

## 2017-02-13 DIAGNOSIS — Z813 Family history of other psychoactive substance abuse and dependence: Secondary | ICD-10-CM | POA: Diagnosis not present

## 2017-02-13 DIAGNOSIS — F410 Panic disorder [episodic paroxysmal anxiety] without agoraphobia: Secondary | ICD-10-CM | POA: Diagnosis not present

## 2017-02-13 DIAGNOSIS — Z811 Family history of alcohol abuse and dependence: Secondary | ICD-10-CM | POA: Diagnosis not present

## 2017-02-13 DIAGNOSIS — F17210 Nicotine dependence, cigarettes, uncomplicated: Secondary | ICD-10-CM | POA: Diagnosis not present

## 2017-02-13 DIAGNOSIS — F411 Generalized anxiety disorder: Secondary | ICD-10-CM

## 2017-02-13 DIAGNOSIS — F3181 Bipolar II disorder: Secondary | ICD-10-CM

## 2017-02-13 DIAGNOSIS — Z818 Family history of other mental and behavioral disorders: Secondary | ICD-10-CM | POA: Diagnosis not present

## 2017-02-13 DIAGNOSIS — Z79899 Other long term (current) drug therapy: Secondary | ICD-10-CM | POA: Diagnosis not present

## 2017-02-13 DIAGNOSIS — Z881 Allergy status to other antibiotic agents status: Secondary | ICD-10-CM | POA: Diagnosis not present

## 2017-02-13 DIAGNOSIS — G4700 Insomnia, unspecified: Secondary | ICD-10-CM

## 2017-02-13 MED ORDER — QUETIAPINE FUMARATE 200 MG PO TABS: 200.0000 mg | ORAL_TABLET | Freq: Every evening | ORAL | 1 refills | Status: DC | PRN

## 2017-02-13 MED ORDER — BUSPIRONE HCL 15 MG PO TABS: 15.0000 mg | ORAL_TABLET | Freq: Three times a day (TID) | ORAL | 2 refills | Status: DC

## 2017-02-13 MED ORDER — LITHIUM CARBONATE ER 300 MG PO TBCR: 1200.0000 mg | EXTENDED_RELEASE_TABLET | Freq: Every day | ORAL | 2 refills | Status: DC

## 2017-02-13 MED ORDER — CITALOPRAM HYDROBROMIDE 40 MG PO TABS: 40.0000 mg | ORAL_TABLET | Freq: Every day | ORAL | 2 refills | Status: DC

## 2017-02-13 MED ORDER — LORAZEPAM 1 MG PO TABS
1.0000 mg | ORAL_TABLET | Freq: Two times a day (BID) | ORAL | 2 refills | Status: DC | PRN
Start: 2017-02-13 — End: 2017-04-10

## 2017-02-13 NOTE — Progress Notes (Signed)
BH MD/PA/NP OP Progress Note  02/13/2017 11:31 AM Rachael Darbyobert L Bjorn III  MRN:  [id number]  Chief Complaint:  Chief Complaint    Follow-up      HPI: Pt states he was feeling hypomanic for about 4 week. He was more energized, more focused and goal directed. Mood was ok and he was mildly elevated. He was not irritable.  He denies any impulsive behaviors. He didn't feel the need to sleep but medications helped him sleep for 6 hrs.   That feels began to fade about 2 weeks ago He would be very tired after work. He is not as productive and is not as focused. He has decreased motivation. He was more irritable and apathetic. [name] thinks he might be depressed. Sleep is good. He denies SI/HI.  Anxiety remains but it is tolerable. He has been having some mild panic attacks but states they are not as severe as before.  Taking meds as prescribed and denies SE.    Visit Diagnosis:    ICD-10-CM   1. Bipolar 2 disorder (HCC) F31.81 QUEtiapine (SEROQUEL) 200 MG tablet    lithium carbonate (LITHOBID) 300 MG CR tablet    citalopram (CELEXA) 40 MG tablet  2. Panic 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 F41.0 LORazepam (ATIVAN) 1 MG tablet    citalopram (CELEXA) 40 MG tablet  3. GAD (generalized anxiety disorder) F41.1 LORazepam (ATIVAN) 1 MG tablet    citalopram (CELEXA) 40 MG tablet    busPIRone (BUSPAR) 15 MG tablet    Past Psychiatric History:  Anxiety:Yes Bipolar Disorder:No Depression:Yes Mania:No Psychosis:No Schizophrenia:No Personality Disorder:No Hospitalization for psychiatric illness:No History of Electroconvulsive Shock Therapy:No Prior Suicide Attempts:No  Past Medical History:  Past Medical History:  Diagnosis Date  . Acute kidney failure (HCC) 01/2012  . Anxiety   . Bipolar disorder (HCC)    hypomania  . COPD (chronic obstructive pulmonary disease) (HCC)   . Depression   . Low back pain     Past Surgical History:  Procedure Laterality Date  . NO PAST SURGERIES       Family Psychiatric History: Family History  Problem Relation Age of Onset  . Hypertension Other   . Anxiety disorder Mother   . Alcohol abuse Father   . Drug abuse Father   . Bipolar disorder Sister   . Alcohol abuse Brother   . Drug abuse Brother   . Bipolar disorder Paternal Aunt   . Schizophrenia Paternal Aunt   . Cancer Neg Hx   . Diabetes Neg Hx   . Heart disease Neg Hx   . Hyperlipidemia Neg Hx   . Stroke Neg Hx   . Suicidality Neg Hx     Social History:  Social History   Social History  . Marital status: Married    Spouse name: N/A  . Number of children: N/A  . Years of education: N/A   Social History Main Topics  . Smoking status: Current Every Day Smoker    Packs/day: 1.00    Years: 20.00    Types: Cigarettes  . Smokeless tobacco: Never Used  . Alcohol use No     Comment: 2-3 drinks every month  . Drug use: No  . Sexual activity: Yes    Birth control/ protection: None   Other Topics Concern  . None   Social History Narrative  . None    Allergies:  Allergies  Allergen Reactions  . Ciprofloxacin     Acute kidney failure    Metabolic Disorder Labs: Lab Results  Component Value Date   HGBA1C 5.2 02/03/2014   MPG 103 02/03/2014   No results found for: PROLACTIN Lab Results  Component Value Date   CHOL 143 01/21/2011   TRIG 174.0 (H) 01/21/2011   HDL 31.20 (L) 01/21/2011   CHOLHDL 5 01/21/2011   VLDL 34.8 01/21/2011   LDLCALC 77 01/21/2011     Current Medications: Current Outpatient Prescriptions  Medication Sig Dispense Refill  . baclofen (LIORESAL) 10 MG tablet Take 10 mg by mouth as needed for muscle spasms.    . busPIRone (BUSPAR) 15 MG tablet Take 1 tablet (15 mg total) by mouth 3 (three) times daily. 90 tablet 2  . celecoxib (CELEBREX) 100 MG capsule Take 100 mg by mouth as needed for mild pain.     . citalopram (CELEXA) 40 MG tablet Take 1 tablet (40 mg total) by mouth daily. 30 tablet 2  . cyclobenzaprine (FLEXERIL) 10 MG  tablet Take 1 tablet (10 mg total) by mouth 3 (three) times daily as needed. (Patient taking differently: Take 10 mg by mouth 3 (three) times daily as needed for muscle spasms. ) 30 tablet 0  . gabapentin (NEURONTIN) 300 MG capsule Take 300 mg by mouth daily as needed. For pain    . HYDROcodone-acetaminophen (NORCO) 10-325 MG tablet Take 1-2 tablets by mouth every 6 (six) hours as needed. 15 tablet 0  . lithium carbonate (LITHOBID) 300 MG CR tablet Take 4 tablets (1,200 mg total) by mouth at bedtime. 120 tablet 2  . LORazepam (ATIVAN) 1 MG tablet Take 1 tablet (1 mg total) by mouth 2 (two) times daily as needed for anxiety. 60 tablet 2  . QUEtiapine (SEROQUEL) 200 MG tablet Take 1 tablet (200 mg total) by mouth at bedtime as needed. 30 tablet 1  . tadalafil (CIALIS) 5 MG tablet Take 5 mg by mouth daily as needed for erectile dysfunction.     No current facility-administered medications for this visit.       Musculoskeletal: Strength & Muscle Tone: within normal limits Gait & Station: normal Patient leans: N/A  Psychiatric Specialty Exam: Review of Systems  HENT: Negative for congestion, nosebleeds, sinus pain and sore throat.   Neurological: Negative for dizziness, tingling, tremors and headaches.  Psychiatric/Behavioral: Positive for depression. Negative for hallucinations, substance abuse and suicidal ideas. The patient is nervous/anxious. The patient does not have insomnia.     Blood pressure 126/78, pulse (!) 101, height 6\' 5"  (1.956 m), weight 225 lb 6.4 oz (102.2 kg).Body mass index is 26.73 kg/m.  General Appearance: Well Groomed  Eye Contact:  Good  Speech:  Clear and Coherent and Normal Rate  Volume:  Normal  Mood:  Depressed  Affect:  Blunt  Thought Process:  Goal Directed and Descriptions of Associations: Intact  Orientation:  Full (Time, Place, and Person)  Thought Content: Logical   Suicidal Thoughts:  No  Homicidal Thoughts:  No  Memory:  Immediate;   Good Recent;    Good Remote;   Good  Judgement:  Good  Insight:  Good  Psychomotor Activity:  Normal  Concentration:  Concentration: Good and Attention Span: Good  Recall:  Good  Fund of Knowledge: Good  Language: Good  Akathisia:  No  Handed:  Right  AIMS (if indicated):  n/a  Assets:  Communication Skills Desire for Improvement Housing Leisure Time Physical Health Resilience Social Support Transportation Vocational/Educational  ADL's:  Intact  Cognition: WNL  Sleep:  good     Treatment Plan Summary:Medication management  Assessment: Bipolar II d/o; Panic 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; GAD   Medication management with supportive therapy. Risks/benefits and SE of the medication discussed. Pt verbalized understanding and verbal consent obtained for treatment.  Affirm with the patient that the medications are taken as ordered. Patient expressed understanding of how their medications were to be used.   Meds: Lithobid 1200mg  po qHS for Bipolar disorder Celexa 40mg  po qD for depression and anxiety Increase Seroquel 200mg  po qHS for Bipolar disorder and insomnia Buspar 15mg  po TID for anxiety Ativan 1mg  po BID prn anxiety  Labs: none  Therapy: brief supportive therapy provided. Discussed psychosocial stressors in detail.     Consultations: none  Pt denies SI and is at an acute low risk for suicide. Patient told to call clinic if any problems occur. Patient advised to go to ER if they should develop SI/HI, side effects, or if symptoms worsen. Has crisis numbers to call if needed. Pt verbalized understanding.  F/up in 3 weeks or sooner if needed  [name], MD 02/13/2017, 11:31 AM

## 2017-02-21 MED FILL — CITALOPRAM HBR 40 MG TABLET: 40 | 30 days supply | Qty: 30 | Fill #2

## 2017-02-21 MED FILL — LORazepam 1 MG TABS: 1 | 30 days supply | Qty: 60 | Fill #0

## 2017-02-21 MED FILL — QUETIAPINE FUMARATE 200 MG: 200 | 30 days supply | Qty: 30 | Fill #0

## 2017-02-21 MED FILL — busPIRone HCL 15 MG TABS: 15 | 30 days supply | Qty: 90 | Fill #1

## 2017-02-21 MED FILL — LITHIUM CARBONATE ER 300 MG: 300 | 30 days supply | Qty: 120 | Fill #1

## 2017-03-06 ENCOUNTER — Telehealth (HOSPITAL_COMMUNITY): Payer: Self-pay

## 2017-03-06 NOTE — Telephone Encounter (Signed)
Patient called and states that you wanted to see him back in a month after his last visit due to a new start of medication. You sis not have an opening until September so he called to check in. Patient states the medication is not working, he is still experiencing depression. He would like a call back form you, his number is [phone]. Thank you.

## 2017-03-07 NOTE — Telephone Encounter (Signed)
Called patient and left a voicemail asking him if this was a good time for him and if so to call me back at my direct number.

## 2017-03-07 NOTE — Telephone Encounter (Signed)
See if he would be willing to come in on Thursday, July 26 at 8am

## 2017-03-26 MED FILL — QUETIAPINE FUMARATE 200 MG: 200 | 30 days supply | Qty: 30 | Fill #1

## 2017-03-26 MED FILL — busPIRone HCL 15 MG TABS: 15 | 30 days supply | Qty: 90 | Fill #2

## 2017-03-26 MED FILL — LORazepam 1 MG TABS: 1 | 30 days supply | Qty: 60 | Fill #1

## 2017-03-26 MED FILL — CITALOPRAM HBR 40 MG TABLET: 40 | 30 days supply | Qty: 30 | Fill #0

## 2017-03-26 MED FILL — LITHIUM CARBONATE ER 300 MG: 300 | 30 days supply | Qty: 120 | Fill #2

## 2017-04-10 ENCOUNTER — Encounter (HOSPITAL_COMMUNITY): Payer: Self-pay | Admitting: Psychiatry

## 2017-04-10 ENCOUNTER — Ambulatory Visit (INDEPENDENT_AMBULATORY_CARE_PROVIDER_SITE_OTHER): Payer: 59 | Admitting: Psychiatry

## 2017-04-10 DIAGNOSIS — F3181 Bipolar II disorder: Secondary | ICD-10-CM

## 2017-04-10 DIAGNOSIS — Z811 Family history of alcohol abuse and dependence: Secondary | ICD-10-CM | POA: Diagnosis not present

## 2017-04-10 DIAGNOSIS — Z818 Family history of other mental and behavioral disorders: Secondary | ICD-10-CM

## 2017-04-10 DIAGNOSIS — F410 Panic disorder [episodic paroxysmal anxiety] without agoraphobia: Secondary | ICD-10-CM | POA: Diagnosis not present

## 2017-04-10 DIAGNOSIS — F411 Generalized anxiety disorder: Secondary | ICD-10-CM

## 2017-04-10 DIAGNOSIS — F17210 Nicotine dependence, cigarettes, uncomplicated: Secondary | ICD-10-CM

## 2017-04-10 DIAGNOSIS — Z813 Family history of other psychoactive substance abuse and dependence: Secondary | ICD-10-CM | POA: Diagnosis not present

## 2017-04-10 MED ORDER — LITHIUM CARBONATE ER 300 MG PO TBCR: 1200.0000 mg | EXTENDED_RELEASE_TABLET | Freq: Every day | ORAL | 0 refills | Status: DC

## 2017-04-10 MED ORDER — BUSPIRONE HCL 15 MG PO TABS: 15.0000 mg | ORAL_TABLET | Freq: Three times a day (TID) | ORAL | 0 refills | Status: DC

## 2017-04-10 MED ORDER — QUETIAPINE FUMARATE 200 MG PO TABS: 200.0000 mg | ORAL_TABLET | Freq: Every evening | ORAL | 0 refills | Status: DC | PRN

## 2017-04-10 MED ORDER — LORAZEPAM 1 MG PO TABS: 1.0000 mg | ORAL_TABLET | Freq: Two times a day (BID) | ORAL | 0 refills | Status: DC | PRN

## 2017-04-10 MED ORDER — DIVALPROEX SODIUM 500 MG PO DR TAB: 500.0000 mg | DELAYED_RELEASE_TABLET | Freq: Two times a day (BID) | ORAL | 0 refills | Status: DC

## 2017-04-10 MED FILL — DIVALPROEX SOD DR 500 MG TA: 500 | 30 days supply | Qty: 60 | Fill #0

## 2017-04-10 NOTE — Progress Notes (Signed)
BH MD/PA/NP OP Progress Note  04/10/2017 2:45 PM DRAKO MAESE III  MRN:  [id number]  Chief Complaint:  Chief Complaint    Follow-up     HPI: Pt states he has become apathetic. He states his depression is worsening and he is now to the point where he doesn't care anymore. He continues to go to work and care/provide for his family but is putting in the least amount of effort he can. Pt states he is tired and often feels he is putting on a show for others. Wife tells him he seems better for about 10 days in July but since then has had a rapid decline. Pt states he is sleeping well with Seroquel. He has some mild anxiety but it tolerable and Ativan helps. Pt reports he had SI without plan or intent about one week ago. Pt denies HI.   He takes his meds as prescribed and denies SE.   Visit Diagnosis:    ICD-10-CM   1. GAD (generalized anxiety disorder) F41.1 busPIRone (BUSPAR) 15 MG tablet    LORazepam (ATIVAN) 1 MG tablet  2. Panic 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 F41.0 LORazepam (ATIVAN) 1 MG tablet  3. Bipolar 2 disorder (HCC) F31.81 lithium carbonate (LITHOBID) 300 MG CR tablet    QUEtiapine (SEROQUEL) 200 MG tablet    divalproex (DEPAKOTE) 500 MG DR tablet    Past Psychiatric History: Anxiety:Yes Bipolar Disorder:No Depression:Yes Mania:No Psychosis:No Schizophrenia:No Personality Disorder:No Hospitalization for psychiatric illness:No History of Electroconvulsive Shock Therapy:No Prior Suicide Attempts:No  Past Medical History:  Past Medical History:  Diagnosis Date  . Acute kidney failure (HCC) 01/2012  . Anxiety   . Bipolar disorder (HCC)    hypomania  . COPD (chronic obstructive pulmonary disease) (HCC)   . Depression   . Low back pain     Past Surgical History:  Procedure Laterality Date  . NO PAST SURGERIES      Family Psychiatric History:  Family History  Problem Relation Age of Onset  . Hypertension Other   . Anxiety disorder Mother   . Alcohol  abuse Father   . Drug abuse Father   . Bipolar disorder Sister   . Alcohol abuse Brother   . Drug abuse Brother   . Bipolar disorder Paternal Aunt   . Schizophrenia Paternal Aunt   . Cancer Neg Hx   . Diabetes Neg Hx   . Heart disease Neg Hx   . Hyperlipidemia Neg Hx   . Stroke Neg Hx   . Suicidality Neg Hx     Social History:  Social History   Social History  . Marital status: Married    Spouse name: N/A  . Number of children: N/A  . Years of education: N/A   Social History Main Topics  . Smoking status: Current Every Day Smoker    Packs/day: 1.00    Years: 20.00    Types: Cigarettes  . Smokeless tobacco: Never Used  . Alcohol use No  . Drug use: No  . Sexual activity: Yes    Birth control/ protection: None   Other Topics Concern  . None   Social History Narrative  . None    Allergies:  Allergies  Allergen Reactions  . Ciprofloxacin     Acute kidney failure    Metabolic Disorder Labs: Lab Results  Component Value Date   HGBA1C 5.2 02/03/2014   MPG 103 02/03/2014   No results found for: PROLACTIN Lab Results  Component Value Date   CHOL 143 01/21/2011  TRIG 174.0 (H) 01/21/2011   HDL 31.20 (L) 01/21/2011   CHOLHDL 5 01/21/2011   VLDL 34.8 01/21/2011   LDLCALC 77 01/21/2011   Lab Results  Component Value Date   TSH 1.75 10/30/2016   TSH 1.54 05/09/2016    Therapeutic Level Labs: Lab Results  Component Value Date   LITHIUM 0.7 (L) 10/30/2016   LITHIUM 0.5 (L) 05/09/2016   No results found for: VALPROATE No components found for:  CBMZ  Current Medications: Current Outpatient Prescriptions  Medication Sig Dispense Refill  . baclofen (LIORESAL) 10 MG tablet Take 10 mg by mouth as needed for muscle spasms.    . busPIRone (BUSPAR) 15 MG tablet Take 1 tablet (15 mg total) by mouth 3 (three) times daily. 90 tablet 2  . celecoxib (CELEBREX) 100 MG capsule Take 100 mg by mouth as needed for mild pain.     . citalopram (CELEXA) 40 MG tablet  Take 1 tablet (40 mg total) by mouth daily. 30 tablet 2  . cyclobenzaprine (FLEXERIL) 10 MG tablet Take 1 tablet (10 mg total) by mouth 3 (three) times daily as needed. (Patient taking differently: Take 10 mg by mouth 3 (three) times daily as needed for muscle spasms. ) 30 tablet 0  . gabapentin (NEURONTIN) 300 MG capsule Take 300 mg by mouth daily as needed. For pain    . lithium carbonate (LITHOBID) 300 MG CR tablet Take 4 tablets (1,200 mg total) by mouth at bedtime. 120 tablet 2  . LORazepam (ATIVAN) 1 MG tablet Take 1 tablet (1 mg total) by mouth 2 (two) times daily as needed for anxiety. 60 tablet 2  . QUEtiapine (SEROQUEL) 200 MG tablet Take 1 tablet (200 mg total) by mouth at bedtime as needed. 30 tablet 1  . HYDROcodone-acetaminophen (NORCO) 10-325 MG tablet Take 1-2 tablets by mouth every 6 (six) hours as needed. (Patient not taking: Reported on 04/10/2017) 15 tablet 0  . tadalafil (CIALIS) 5 MG tablet Take 5 mg by mouth daily as needed for erectile dysfunction.     No current facility-administered medications for this visit.      Musculoskeletal: Strength & Muscle Tone: within normal limits Gait & Station: normal Patient leans: N/A  Psychiatric Specialty Exam: Review of Systems  Neurological: Negative for dizziness, tingling, sensory change and headaches.  Psychiatric/Behavioral: Positive for depression. Negative for hallucinations, substance abuse and suicidal ideas. The patient is nervous/anxious. The patient does not have insomnia.     Blood pressure 130/78, pulse 93, height 6\' 5"  (1.956 m), weight 231 lb (104.8 kg).Body mass index is 27.39 kg/m.  General Appearance: Fairly Groomed  Eye Contact:  Good  Speech:  Clear and Coherent and Normal Rate  Volume:  Normal  Mood:  Depressed  Affect:  Depressed and Flat  Thought Process:  Coherent and Descriptions of Associations: Circumstantial  Orientation:  Full (Time, Place, and Person)  Thought Content: Rumination   Suicidal  Thoughts:  No  Homicidal Thoughts:  No  Memory:  Immediate;   Good Recent;   Good Remote;   Good  Judgement:  Good  Insight:  Good  Psychomotor Activity:  Normal  Concentration:  Concentration: Good and Attention Span: Good  Recall:  Good  Fund of Knowledge: Good  Language: Good  Akathisia:  No  Handed:  Right  AIMS (if indicated): not done  Assets:  Communication Skills Desire for Improvement Housing Intimacy Social Support Talents/Skills Transportation Vocational/Educational  ADL's:  Intact  Cognition: WNL  Sleep:  Good  Screenings: PHQ2-9     Office Visit from 12/22/2015 in [facility]  PHQ-2 Total Score  0       Assessment and Plan: Bipolar II d/o- current episode depressed; Panic 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; GAD   Medication management with supportive therapy. Risks/benefits and SE of the medication discussed. Pt verbalized understanding and verbal consent obtained for treatment.  Affirm with the patient that the medications are taken as ordered. Patient expressed understanding of how their medications were to be used.   Meds:  Lithobid 1200mg  po qHS for Bipolar disorder D/c Celexa  Start trial of Depakote 500mg  po BID for depression Seroquel 200mg  po qHS for Bipolar disorder and insomnia Buspar 15mg  po TID for anxiety Ativan 1mg  po BID prn anxiety   Labs: none  Therapy: brief supportive therapy provided. Discussed psychosocial stressors in detail.   Pt declined voluntary inpt psych admission  Consultations: Referred for therapy  Pt denies SI today and is at an acute low risk for suicide. Patient told to call clinic if any problems occur. Patient advised to go to ER if they should develop SI/HI, side effects, or if symptoms worsen. Has crisis numbers to call if needed. Pt verbalized understanding.  F/up in 1 week or sooner if needed    [name], MD 04/10/2017, 2:45 PM

## 2017-04-17 ENCOUNTER — Encounter (HOSPITAL_COMMUNITY): Payer: Self-pay | Admitting: Psychiatry

## 2017-04-17 ENCOUNTER — Ambulatory Visit (INDEPENDENT_AMBULATORY_CARE_PROVIDER_SITE_OTHER): Payer: 59 | Admitting: Psychiatry

## 2017-04-17 DIAGNOSIS — R5381 Other malaise: Secondary | ICD-10-CM

## 2017-04-17 DIAGNOSIS — F17210 Nicotine dependence, cigarettes, uncomplicated: Secondary | ICD-10-CM

## 2017-04-17 DIAGNOSIS — Z811 Family history of alcohol abuse and dependence: Secondary | ICD-10-CM | POA: Diagnosis not present

## 2017-04-17 DIAGNOSIS — F411 Generalized anxiety disorder: Secondary | ICD-10-CM

## 2017-04-17 DIAGNOSIS — R109 Unspecified abdominal pain: Secondary | ICD-10-CM

## 2017-04-17 DIAGNOSIS — Z813 Family history of other psychoactive substance abuse and dependence: Secondary | ICD-10-CM | POA: Diagnosis not present

## 2017-04-17 DIAGNOSIS — M549 Dorsalgia, unspecified: Secondary | ICD-10-CM

## 2017-04-17 DIAGNOSIS — Z818 Family history of other mental and behavioral disorders: Secondary | ICD-10-CM | POA: Diagnosis not present

## 2017-04-17 DIAGNOSIS — F3181 Bipolar II disorder: Secondary | ICD-10-CM | POA: Diagnosis not present

## 2017-04-17 DIAGNOSIS — R5383 Other fatigue: Secondary | ICD-10-CM | POA: Diagnosis not present

## 2017-04-17 DIAGNOSIS — G4700 Insomnia, unspecified: Secondary | ICD-10-CM

## 2017-04-17 DIAGNOSIS — F410 Panic disorder [episodic paroxysmal anxiety] without agoraphobia: Secondary | ICD-10-CM | POA: Diagnosis not present

## 2017-04-17 MED ORDER — DIVALPROEX SODIUM 250 MG PO DR TAB: 250.0000 mg | DELAYED_RELEASE_TABLET | Freq: Two times a day (BID) | ORAL | 0 refills | Status: DC

## 2017-04-17 MED FILL — DIVALPROEX SOD DR 250 MG TA: 250 | 30 days supply | Qty: 60 | Fill #0

## 2017-04-17 NOTE — Progress Notes (Signed)
BH MD/PA/NP OP Progress Note  04/17/2017 8:05 AM Matthew [name]  MRN:  [id number]  Chief Complaint:  Chief Complaint    Follow-up     HPI: Pt states he started Depakote on Thursday night. On Monday and Tuesday he "felt wiped out" and he had to leave work early. He skipped Tuesday night or Wednesday morning dose so that he could go to work. He restarted Wed night and Thur morning.  It also caused GI upset and nausea. Despite all these SE he no felt the need to leave and is feeling better. He is a little scared to feel happy because he has been so depressed. His wife tells him he is more pleasant and interactive.   His depression has decreased over the last week. His anxiety is more tolerable. He feels more hopeful about getting better. Sleep is good. Energy is low. Appetite is good. He denies SI/HI.  Denies manic and hypomanic symptoms including periods of decreased need for sleep, increased energy, mood lability, impulsivity, FOI, and excessive spending.   Visit Diagnosis:    ICD-10-CM   1. Bipolar 2 disorder (HCC) F31.81 divalproex (DEPAKOTE) 250 MG DR tablet      Past Psychiatric History:   Anxiety:Yes Bipolar Disorder:No Depression:Yes Mania:No Psychosis:No Schizophrenia:No Personality Disorder:No Hospitalization for psychiatric illness:No History of Electroconvulsive Shock Therapy:No Prior Suicide Attempts:No  Past Medical History:  Past Medical History:  Diagnosis Date  . Acute kidney failure (HCC) 01/2012  . Anxiety   . Bipolar disorder (HCC)    hypomania  . COPD (chronic obstructive pulmonary disease) (HCC)   . Depression   . Low back pain     Past Surgical History:  Procedure Laterality Date  . NO PAST SURGERIES      Family Psychiatric History:  Family History  Problem Relation Age of Onset  . Hypertension Other   . Anxiety disorder Mother   . Alcohol abuse Father   . Drug abuse Father   . Bipolar disorder Sister   . Alcohol abuse  Brother   . Drug abuse Brother   . Bipolar disorder Paternal Aunt   . Schizophrenia Paternal Aunt   . Cancer Neg Hx   . Diabetes Neg Hx   . Heart disease Neg Hx   . Hyperlipidemia Neg Hx   . Stroke Neg Hx   . Suicidality Neg Hx     Social History:  Social History   Social History  . Marital status: Married    Spouse name: N/A  . Number of children: N/A  . Years of education: N/A   Social History Main Topics  . Smoking status: Current Every Day Smoker    Packs/day: 1.00    Years: 20.00    Types: Cigarettes  . Smokeless tobacco: Never Used  . Alcohol use No  . Drug use: No  . Sexual activity: Yes    Partners: Female    Birth control/ protection: None   Other Topics Concern  . None   Social History Narrative  . None    Allergies:  Allergies  Allergen Reactions  . Ciprofloxacin     Acute kidney failure    Metabolic Disorder Labs: Lab Results  Component Value Date   HGBA1C 5.2 02/03/2014   MPG 103 02/03/2014   No results found for: PROLACTIN Lab Results  Component Value Date   CHOL 143 01/21/2011   TRIG 174.0 (H) 01/21/2011   HDL 31.20 (L) 01/21/2011   CHOLHDL 5 01/21/2011   VLDL 34.8  01/21/2011   LDLCALC 77 01/21/2011   Lab Results  Component Value Date   TSH 1.75 10/30/2016   TSH 1.54 05/09/2016    Therapeutic Level Labs: Lab Results  Component Value Date   LITHIUM 0.7 (L) 10/30/2016   LITHIUM 0.5 (L) 05/09/2016   No results found for: VALPROATE No components found for:  CBMZ  Current Medications: Current Outpatient Prescriptions  Medication Sig Dispense Refill  . baclofen (LIORESAL) 10 MG tablet Take 10 mg by mouth as needed for muscle spasms.    . busPIRone (BUSPAR) 15 MG tablet Take 1 tablet (15 mg total) by mouth 3 (three) times daily. 90 tablet 0  . celecoxib (CELEBREX) 100 MG capsule Take 100 mg by mouth as needed for mild pain.     . cyclobenzaprine (FLEXERIL) 10 MG tablet Take 1 tablet (10 mg total) by mouth 3 (three) times  daily as needed. (Patient taking differently: Take 10 mg by mouth 3 (three) times daily as needed for muscle spasms. ) 30 tablet 0  . divalproex (DEPAKOTE) 500 MG DR tablet Take 1 tablet (500 mg total) by mouth 2 (two) times daily. 60 tablet 0  . gabapentin (NEURONTIN) 300 MG capsule Take 300 mg by mouth daily as needed. For pain    . HYDROcodone-acetaminophen (NORCO) 10-325 MG tablet Take 1-2 tablets by mouth every 6 (six) hours as needed. (Patient not taking: Reported on 04/10/2017) 15 tablet 0  . lithium carbonate (LITHOBID) 300 MG CR tablet Take 4 tablets (1,200 mg total) by mouth at bedtime. 120 tablet 0  . LORazepam (ATIVAN) 1 MG tablet Take 1 tablet (1 mg total) by mouth 2 (two) times daily as needed for anxiety. 60 tablet 0  . QUEtiapine (SEROQUEL) 200 MG tablet Take 1 tablet (200 mg total) by mouth at bedtime as needed. 30 tablet 0  . tadalafil (CIALIS) 5 MG tablet Take 5 mg by mouth daily as needed for erectile dysfunction.     No current facility-administered medications for this visit.      Musculoskeletal: Strength & Muscle Tone: within normal limits Gait & Station: normal Patient leans: N/A  Psychiatric Specialty Exam: Review of Systems  Constitutional: Positive for malaise/fatigue.  Gastrointestinal: Positive for abdominal pain and nausea. Negative for heartburn and vomiting.  Musculoskeletal: Positive for back pain. Negative for joint pain and neck pain.  Psychiatric/Behavioral: Positive for depression. Negative for hallucinations, substance abuse and suicidal ideas. The patient is not nervous/anxious and does not have insomnia.     Blood pressure 112/78, pulse 97, height 6\' 5"  (1.956 m), weight 234 lb (106.1 kg), SpO2 99 %.Body mass index is 27.75 kg/m.  General Appearance: Fairly Groomed  Eye Contact:  Good  Speech:  Clear and Coherent and Normal Rate  Volume:  Decreased  Mood:  Depressed  Affect:  Congruent and brighter than previous appt  Thought Process:  Linear  and Descriptions of Associations: Intact  Orientation:  Full (Time, Place, and Person)  Thought Content: Logical   Suicidal Thoughts:  No  Homicidal Thoughts:  No  Memory:  Immediate;   Good Recent;   Good Remote;   Good  Judgement:  Good  Insight:  Good  Psychomotor Activity:  Normal  Concentration:  Concentration: Good and Attention Span: Good  Recall:  Good  Fund of Knowledge: Good  Language: Good  Akathisia:  No  Handed:  Right  AIMS (if indicated): not done  Assets:  Communication Skills Desire for Improvement Financial Resources/Insurance Housing Resilience Social Support Talents/Skills  Transportation Vocational/Educational  ADL's:  Intact  Cognition: WNL  Sleep:  Good   Screenings: PHQ2-9     Office Visit from 12/22/2015 in [facility]  PHQ-2 Total Score  0       Assessment and Plan: Bipolar II-current episode depressed; Panic 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; GAD    Medication management with supportive therapy. Risks/benefits and SE of the medication discussed. Pt verbalized understanding and verbal consent obtained for treatment.  Affirm with the patient that the medications are taken as ordered. Patient expressed understanding of how their medications were to be used.   Meds:Lithobid 1200mg  po qHS for Bipolar disorder Decrease Depakote 250mg  po BID for depression. With plan to titrate if needed. Seroquel 200mg  po qHS for Bipolar disorder and insomnia Buspar 15mg  po TID for anxiety Ativan 1mg  po BID prn anxiety   Labs: none  Therapy: brief supportive therapy provided. Discussed psychosocial stressors in detail.     Consultations: none  Pt denies SI and is at an acute low risk for suicide. Patient told to call clinic if any problems occur. Patient advised to go to ER if they should develop SI/HI, side effects, or if symptoms worsen. Has crisis numbers to call if needed. Pt verbalized understanding.  F/up in 1 week or sooner if  needed   [name], MD 04/17/2017, 8:05 AM

## 2017-04-24 ENCOUNTER — Ambulatory Visit (INDEPENDENT_AMBULATORY_CARE_PROVIDER_SITE_OTHER): Payer: 59 | Admitting: Psychiatry

## 2017-04-24 ENCOUNTER — Encounter (HOSPITAL_COMMUNITY): Payer: Self-pay | Admitting: Psychiatry

## 2017-04-24 VITALS — BP 116/78 | HR 98 | Ht 77.0 in | Wt 236.0 lb

## 2017-04-24 DIAGNOSIS — Z818 Family history of other mental and behavioral disorders: Secondary | ICD-10-CM | POA: Diagnosis not present

## 2017-04-24 DIAGNOSIS — Z811 Family history of alcohol abuse and dependence: Secondary | ICD-10-CM | POA: Diagnosis not present

## 2017-04-24 DIAGNOSIS — F411 Generalized anxiety disorder: Secondary | ICD-10-CM

## 2017-04-24 DIAGNOSIS — F3181 Bipolar II disorder: Secondary | ICD-10-CM

## 2017-04-24 DIAGNOSIS — F410 Panic disorder [episodic paroxysmal anxiety] without agoraphobia: Secondary | ICD-10-CM | POA: Diagnosis not present

## 2017-04-24 DIAGNOSIS — G4700 Insomnia, unspecified: Secondary | ICD-10-CM | POA: Diagnosis not present

## 2017-04-24 DIAGNOSIS — Z79899 Other long term (current) drug therapy: Secondary | ICD-10-CM | POA: Diagnosis not present

## 2017-04-24 DIAGNOSIS — F17210 Nicotine dependence, cigarettes, uncomplicated: Secondary | ICD-10-CM | POA: Diagnosis not present

## 2017-04-24 DIAGNOSIS — Z813 Family history of other psychoactive substance abuse and dependence: Secondary | ICD-10-CM | POA: Diagnosis not present

## 2017-04-24 MED ORDER — QUETIAPINE FUMARATE 200 MG PO TABS: 200.0000 mg | ORAL_TABLET | Freq: Every evening | ORAL | 2 refills | Status: DC | PRN

## 2017-04-24 MED ORDER — BUSPIRONE HCL 15 MG PO TABS: 15.0000 mg | ORAL_TABLET | Freq: Two times a day (BID) | ORAL | 2 refills | Status: DC

## 2017-04-24 MED ORDER — LORAZEPAM 1 MG PO TABS: 1.0000 mg | ORAL_TABLET | Freq: Two times a day (BID) | ORAL | 2 refills | Status: DC | PRN

## 2017-04-24 MED ORDER — LITHIUM CARBONATE ER 300 MG PO TBCR: 1200.0000 mg | EXTENDED_RELEASE_TABLET | Freq: Every day | ORAL | 2 refills | Status: DC

## 2017-04-24 MED ORDER — DIVALPROEX SODIUM 250 MG PO DR TAB: 250.0000 mg | DELAYED_RELEASE_TABLET | Freq: Two times a day (BID) | ORAL | 2 refills | Status: DC

## 2017-04-24 MED FILL — QUETIAPINE FUMARATE 200 MG: 200 | 30 days supply | Qty: 30 | Fill #0

## 2017-04-24 MED FILL — LITHIUM CARBONATE ER 300 MG: 300 | 30 days supply | Qty: 120 | Fill #0

## 2017-04-24 MED FILL — LORazepam 1 MG TABS: 1 | 30 days supply | Qty: 60 | Fill #2

## 2017-04-24 MED FILL — busPIRone HCL 15 MG TABS: 15 | 30 days supply | Qty: 60 | Fill #0

## 2017-04-24 NOTE — Progress Notes (Signed)
BH MD/PA/NP OP Progress Note  04/24/2017 11:37 AM Matthew [name]  MRN:  [id number]  Chief Complaint:  Chief Complaint    Follow-up     HPI: Pt states he is tolerating the decreased dose of Depakote better. He is no longer having GI upset and the fatigue has significantly improved. His wife tells him he is less moody, more interactive and less depressed.  She told him that this is the best he has been in several years.   His depression has decreased even more over the last week. His anxiety is more tolerable.  Sleep is good. Energy is low. Appetite is good. He denies SI/HI.  Denies manic and hypomanic symptoms including periods of decreased need for sleep, increased energy, mood lability, impulsivity, FOI, and excessive spending.  He states he feels muscle aches and fatigue for 1 hr after taking Buspar but it then then resolves.   Visit Diagnosis:    ICD-10-CM   1. GAD (generalized anxiety disorder) F41.1 busPIRone (BUSPAR) 15 MG tablet    LORazepam (ATIVAN) 1 MG tablet  2. Bipolar 2 disorder (HCC) F31.81 divalproex (DEPAKOTE) 250 MG DR tablet    lithium carbonate (LITHOBID) 300 MG CR tablet    QUEtiapine (SEROQUEL) 200 MG tablet  3. Panic 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 F41.0 LORazepam (ATIVAN) 1 MG tablet      Past Psychiatric History:   Anxiety:Yes Bipolar Disorder:No Depression:Yes Mania:No Psychosis:No Schizophrenia:No Personality Disorder:No Hospitalization for psychiatric illness:No History of Electroconvulsive Shock Therapy:No Prior Suicide Attempts:No  Past Medical History:  Past Medical History:  Diagnosis Date  . Acute kidney failure (HCC) 01/2012  . Anxiety   . Bipolar disorder (HCC)    hypomania  . COPD (chronic obstructive pulmonary disease) (HCC)   . Depression   . Low back pain     Past Surgical History:  Procedure Laterality Date  . NO PAST SURGERIES      Family Psychiatric History:  Family History  Problem Relation Age of Onset   . Hypertension Other   . Anxiety disorder Mother   . Alcohol abuse Father   . Drug abuse Father   . Bipolar disorder Sister   . Alcohol abuse Brother   . Drug abuse Brother   . Bipolar disorder Paternal Aunt   . Schizophrenia Paternal Aunt   . Cancer Neg Hx   . Diabetes Neg Hx   . Heart disease Neg Hx   . Hyperlipidemia Neg Hx   . Stroke Neg Hx   . Suicidality Neg Hx     Social History:  Social History   Social History  . Marital status: Married    Spouse name: N/A  . Number of children: N/A  . Years of education: N/A   Social History Main Topics  . Smoking status: Current Every Day Smoker    Packs/day: 1.00    Years: 20.00    Types: Cigarettes  . Smokeless tobacco: Never Used  . Alcohol use No  . Drug use: No  . Sexual activity: Yes    Partners: Female    Birth control/ protection: None   Other Topics Concern  . Not on file   Social History Narrative  . No narrative on file    Allergies:  Allergies  Allergen Reactions  . Ciprofloxacin     Acute kidney failure    Metabolic Disorder Labs: Lab Results  Component Value Date   HGBA1C 5.2 02/03/2014   MPG 103 02/03/2014   No results found for: PROLACTIN Lab  Results  Component Value Date   CHOL 143 01/21/2011   TRIG 174.0 (H) 01/21/2011   HDL 31.20 (L) 01/21/2011   CHOLHDL 5 01/21/2011   VLDL 34.8 01/21/2011   LDLCALC 77 01/21/2011   Lab Results  Component Value Date   TSH 1.75 10/30/2016   TSH 1.54 05/09/2016    Therapeutic Level Labs: Lab Results  Component Value Date   LITHIUM 0.7 (L) 10/30/2016   LITHIUM 0.5 (L) 05/09/2016   No results found for: VALPROATE No components found for:  CBMZ  Current Medications: Current Outpatient Prescriptions  Medication Sig Dispense Refill  . baclofen (LIORESAL) 10 MG tablet Take 10 mg by mouth as needed for muscle spasms.    . busPIRone (BUSPAR) 15 MG tablet Take 1 tablet (15 mg total) by mouth 2 (two) times daily. 60 tablet 2  . celecoxib  (CELEBREX) 100 MG capsule Take 100 mg by mouth as needed for mild pain.     . cyclobenzaprine (FLEXERIL) 10 MG tablet Take 1 tablet (10 mg total) by mouth 3 (three) times daily as needed. (Patient taking differently: Take 10 mg by mouth 3 (three) times daily as needed for muscle spasms. ) 30 tablet 0  . divalproex (DEPAKOTE) 250 MG DR tablet Take 1 tablet (250 mg total) by mouth 2 (two) times daily. 60 tablet 2  . gabapentin (NEURONTIN) 300 MG capsule Take 300 mg by mouth daily as needed. For pain    . HYDROcodone-acetaminophen (NORCO) 10-325 MG tablet Take 1-2 tablets by mouth every 6 (six) hours as needed. 15 tablet 0  . lithium carbonate (LITHOBID) 300 MG CR tablet Take 4 tablets (1,200 mg total) by mouth at bedtime. 120 tablet 2  . LORazepam (ATIVAN) 1 MG tablet Take 1 tablet (1 mg total) by mouth 2 (two) times daily as needed for anxiety. 60 tablet 2  . QUEtiapine (SEROQUEL) 200 MG tablet Take 1 tablet (200 mg total) by mouth at bedtime as needed. 30 tablet 2  . tadalafil (CIALIS) 5 MG tablet Take 5 mg by mouth daily as needed for erectile dysfunction.     No current facility-administered medications for this visit.      Musculoskeletal: Strength & Muscle Tone: within normal limits Gait & Station: normal Patient leans: N/A  Psychiatric Specialty Exam: Review of Systems  Gastrointestinal: Negative for abdominal pain, diarrhea, nausea and vomiting.  Psychiatric/Behavioral: Negative for depression, hallucinations, substance abuse and suicidal ideas. The patient is not nervous/anxious and does not have insomnia.     Blood pressure 116/78, pulse 98, height  (1.956 m), weight 236 lb (107 kg), SpO2 98 %.Body mass index is 27.99 kg/m.  General Appearance: fairly groomed  Eye Contact:  Good  Speech:  Clear and Coherent and Normal Rate  Volume:  Normal  Mood:  Euthymic  Affect:  Appropriate and brighter than previous appt  Thought Process:  Goal Directed and Descriptions of  Associations: Intact  Orientation:  Full (Time, Place, and Person)  Thought Content: WDL   Suicidal Thoughts:  No  Homicidal Thoughts:  No  Memory:  Immediate;   Good Recent;   Good Remote;   Good  Judgement:  Good  Insight:  Good  Psychomotor Activity:  Normal  Concentration:  Concentration: Fair and Attention Span: Fair  Recall:  [employer] of Knowledge: Fair  Language: Fair  Akathisia:  No  Handed:  Right  AIMS (if indicated): not done  Assets:  Communication Skills Desire for Improvement Financial Resources/Insurance Housing Intimacy  Leisure Time Physical Health Resilience Social Support Talents/Skills Transportation Vocational/Educational  ADL's:  Intact  Cognition: WNL  Sleep:  Good   Screenings: PHQ2-9     Office Visit from 12/22/2015 in [facility]  PHQ-2 Total Score  0       Assessment and Plan: Bipolar II-current episode depressed; Panic 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; GAD    Medication management with supportive therapy. Risks/benefits and SE of the medication discussed. Pt verbalized understanding and verbal consent obtained for treatment.  Affirm with the patient that the medications are taken as ordered. Patient expressed understanding of how their medications were to be used.   Meds:Lithobid  po qHS for Bipolar disorder  Depakote  po BID for depression. Seroquel  po qHS for Bipolar disorder and insomnia Decrease Buspar  po BID for anxiety Ativan  po BID prn anxiety   Labs: ordered Depakote level, Lithium- Lithium level, CBC, CMP, HbA1c, Lipid panel, TSH, Prolactin level, EKG  Therapy: brief supportive therapy provided. Discussed psychosocial stressors in detail.     Consultations: none  Pt denies SI and is at an acute low risk for suicide. Patient told to call clinic if any problems occur. Patient advised to go to ER if they should develop SI/HI, side effects, or if symptoms worsen. Has crisis numbers  to call if needed. Pt verbalized understanding.  F/up in 1 month or sooner if needed   [name], MD 04/24/2017, 11:37 AM

## 2017-04-24 NOTE — Addendum Note (Signed)
Addended by: Georgian CoATKINS, Aleenah Homen E on: 04/24/2017 11:50 AM   Modules accepted: Orders

## 2017-04-25 LAB — CBC WITH DIFFERENTIAL/PLATELET
BASOS ABS: 0 10*3/uL (ref 0.0–0.2)
BASOS: 0 %
EOS (ABSOLUTE): 0.4 10*3/uL (ref 0.0–0.4)
Eos: 4 %
Hematocrit: 42.8 % (ref 37.5–51.0)
Hemoglobin: 14.4 g/dL (ref 13.0–17.7)
IMMATURE GRANS (ABS): 0 10*3/uL (ref 0.0–0.1)
IMMATURE GRANULOCYTES: 0 %
LYMPHS: 26 %
Lymphocytes Absolute: 2.6 10*3/uL (ref 0.7–3.1)
MCH: 31.2 pg (ref 26.6–33.0)
MCHC: 33.6 g/dL (ref 31.5–35.7)
MCV: 93 fL (ref 79–97)
Monocytes Absolute: 0.9 10*3/uL (ref 0.1–0.9)
Monocytes: 9 %
NEUTROS PCT: 61 %
Neutrophils Absolute: 6.1 10*3/uL (ref 1.4–7.0)
PLATELETS: 195 10*3/uL (ref 150–379)
RBC: 4.61 x10E6/uL (ref 4.14–5.80)
RDW: 14 % (ref 12.3–15.4)
WBC: 10 10*3/uL (ref 3.4–10.8)

## 2017-04-25 LAB — COMPREHENSIVE METABOLIC PANEL
ALT: 21 IU/L (ref 0–44)
AST: 26 IU/L (ref 0–40)
Albumin/Globulin Ratio: 2 (ref 1.2–2.2)
Albumin: 4.5 g/dL (ref 3.5–5.5)
Alkaline Phosphatase: 62 IU/L (ref 39–117)
BUN/Creatinine Ratio: 14 (ref 9–20)
BUN: 13 mg/dL (ref 6–24)
Bilirubin Total: 0.3 mg/dL (ref 0.0–1.2)
CALCIUM: 9.5 mg/dL (ref 8.7–10.2)
CO2: 22 mmol/L (ref 20–29)
Chloride: 105 mmol/L (ref 96–106)
Creatinine, Ser: 0.91 mg/dL (ref 0.76–1.27)
GFR, EST AFRICAN AMERICAN: 116 mL/min/{1.73_m2} (ref >59)
GFR, EST NON AFRICAN AMERICAN: 101 mL/min/{1.73_m2} (ref >59)
GLUCOSE: 97 mg/dL (ref 65–99)
Globulin, Total: 2.2 g/dL (ref 1.5–4.5)
Potassium: 4.8 mmol/L (ref 3.5–5.2)
Sodium: 142 mmol/L (ref 134–144)
TOTAL PROTEIN: 6.7 g/dL (ref 6.0–8.5)

## 2017-04-25 LAB — LIPID PANEL W/O CHOL/HDL RATIO
CHOLESTEROL TOTAL: 141 mg/dL (ref 100–199)
HDL: 27 mg/dL — ABNORMAL LOW (ref >39)
LDL CALC: 50 mg/dL (ref 0–99)
Triglycerides: 319 mg/dL — ABNORMAL HIGH (ref 0–149)
VLDL Cholesterol Cal: 64 mg/dL — ABNORMAL HIGH (ref 5–40)

## 2017-04-25 LAB — HEMOGLOBIN A1C
Est. average glucose Bld gHb Est-mCnc: 100 mg/dL
HEMOGLOBIN A1C: 5.1 % (ref 4.8–5.6)

## 2017-04-25 LAB — TSH: TSH: 2.02 u[IU]/mL (ref 0.450–4.500)

## 2017-04-25 LAB — VALPROIC ACID LEVEL: Valproic Acid Lvl: 46 ug/mL — ABNORMAL LOW (ref 50–100)

## 2017-04-25 LAB — LITHIUM LEVEL: Lithium Lvl: 0.9 mmol/L (ref 0.6–1.2)

## 2017-04-25 LAB — PROLACTIN: Prolactin: 6.4 ng/mL (ref 4.0–15.2)

## 2017-04-28 ENCOUNTER — Other Ambulatory Visit (HOSPITAL_COMMUNITY): Payer: Self-pay

## 2017-04-28 DIAGNOSIS — F3181 Bipolar II disorder: Secondary | ICD-10-CM

## 2017-04-28 MED ORDER — DIVALPROEX SODIUM ER 500 MG PO TB24
500.0000 mg | ORAL_TABLET | Freq: Every day | ORAL | 0 refills | Status: DC
Start: 2017-04-28 — End: 2017-06-05

## 2017-04-28 MED FILL — DIVALPROEX SOD ER 500 MG TA: 500 | 90 days supply | Qty: 90 | Fill #0

## 2017-04-28 NOTE — Progress Notes (Signed)
Patient not doing that well on the Depakote twice a day. Patient reports he is still very tired. He requested an extended release tablet and Dr. Michae KavaAgarwal agreed. A new rx was sent to the pharmacy for Depakote 500 mg 1 po qhs. I called patient and made him aware and told him that Dr, Michae KavaAgarwal would like him to call in a couple of weeks and let us know how he is doing.

## 2017-05-26 MED FILL — LITHIUM CARBONATE ER 300 MG: 300 | 30 days supply | Qty: 120 | Fill #1

## 2017-05-26 MED FILL — busPIRone HCL 15 MG TABS: 15 | 30 days supply | Qty: 60 | Fill #1

## 2017-05-26 MED FILL — QUETIAPINE FUMARATE 200 MG: 200 | 30 days supply | Qty: 30 | Fill #1

## 2017-05-26 MED FILL — LORazepam 1 MG TABS: 1 | 30 days supply | Qty: 60 | Fill #0

## 2017-06-05 ENCOUNTER — Ambulatory Visit (INDEPENDENT_AMBULATORY_CARE_PROVIDER_SITE_OTHER): Payer: 59 | Admitting: Psychiatry

## 2017-06-05 ENCOUNTER — Encounter (HOSPITAL_COMMUNITY): Payer: Self-pay | Admitting: Psychiatry

## 2017-06-05 DIAGNOSIS — Z79899 Other long term (current) drug therapy: Secondary | ICD-10-CM | POA: Diagnosis not present

## 2017-06-05 DIAGNOSIS — Z818 Family history of other mental and behavioral disorders: Secondary | ICD-10-CM | POA: Diagnosis not present

## 2017-06-05 DIAGNOSIS — Z811 Family history of alcohol abuse and dependence: Secondary | ICD-10-CM

## 2017-06-05 DIAGNOSIS — Z813 Family history of other psychoactive substance abuse and dependence: Secondary | ICD-10-CM

## 2017-06-05 DIAGNOSIS — F410 Panic disorder [episodic paroxysmal anxiety] without agoraphobia: Secondary | ICD-10-CM

## 2017-06-05 DIAGNOSIS — F17210 Nicotine dependence, cigarettes, uncomplicated: Secondary | ICD-10-CM

## 2017-06-05 DIAGNOSIS — G4700 Insomnia, unspecified: Secondary | ICD-10-CM

## 2017-06-05 DIAGNOSIS — F3181 Bipolar II disorder: Secondary | ICD-10-CM | POA: Diagnosis not present

## 2017-06-05 DIAGNOSIS — F411 Generalized anxiety disorder: Secondary | ICD-10-CM

## 2017-06-05 MED ORDER — QUETIAPINE FUMARATE 100 MG PO TABS: 100.0000 mg | ORAL_TABLET | Freq: Every day | ORAL | 1 refills | Status: DC

## 2017-06-05 MED ORDER — LITHIUM CARBONATE ER 300 MG PO TBCR: 1200.0000 mg | EXTENDED_RELEASE_TABLET | Freq: Every day | ORAL | 1 refills | Status: DC

## 2017-06-05 MED ORDER — BUSPIRONE HCL 15 MG PO TABS: 15.0000 mg | ORAL_TABLET | Freq: Two times a day (BID) | ORAL | 1 refills | Status: DC

## 2017-06-05 MED ORDER — DIVALPROEX SODIUM ER 250 MG PO TB24: ORAL_TABLET | ORAL | 1 refills | Status: DC

## 2017-06-05 MED ORDER — LORAZEPAM 1 MG PO TABS: 1.0000 mg | ORAL_TABLET | Freq: Two times a day (BID) | ORAL | 1 refills | Status: DC | PRN

## 2017-06-05 MED FILL — DIVALPROEX SOD DR 250 MG TA: 250 | 30 days supply | Qty: 90 | Fill #0

## 2017-06-05 NOTE — Progress Notes (Signed)
BH MD/PA/NP OP Progress Note  06/05/2017 2:51 PM Matthew [name]  MRN:  [id number]  Chief Complaint:  Chief Complaint    Follow-up     HPI: Pt states his mood has been all over the place. Some days for a while he will be happy and other days he feels depressed with passive thoughts of death and other days he is in the "middle" with no feelings either way. He seems to notice the depression a little more during the day. His wife tells him he is more present. He feels irritable and will sometimes snap at his wife. He denies HI. Sleep is good and he has trouble waking in the morning. He has been productive at work and has realized he needs to prioritize his projects as his workload is overwhelming. Anxiety is present but more manageable. He is still taking Ativan BID and its to calm him.  Pt denies manic and hypomanic symptoms including periods of decreased need for sleep, increased energy, mood lability, impulsivity, FOI, and excessive spending.  Pt states-taking meds as prescribed and endorsing SE of fatigue.     Visit Diagnosis:    ICD-10-CM   1. GAD (generalized anxiety disorder) F41.1 busPIRone (BUSPAR) 15 MG tablet    LORazepam (ATIVAN) 1 MG tablet  2. Bipolar 2 disorder (HCC) F31.81 divalproex (DEPAKOTE ER) 250 MG 24 hr tablet    lithium carbonate (LITHOBID) 300 MG CR tablet    QUEtiapine (SEROQUEL) 100 MG tablet  3. Panic 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 F41.0 LORazepam (ATIVAN) 1 MG tablet    Past Psychiatric History:  Anxiety:Yes Bipolar Disorder:No Depression:Yes Mania:No Psychosis:No Schizophrenia:No Personality Disorder:No Hospitalization for psychiatric illness:No History of Electroconvulsive Shock Therapy:No Prior Suicide Attempts:No  Past Medical History:  Past Medical History:  Diagnosis Date  . Acute kidney failure (HCC) 01/2012  . Anxiety   . Bipolar disorder (HCC)    hypomania  . COPD (chronic obstructive pulmonary disease) (HCC)   .  Depression   . Low back pain     Past Surgical History:  Procedure Laterality Date  . NO PAST SURGERIES      Family Psychiatric History:  Family History  Problem Relation Age of Onset  . Hypertension Other   . Anxiety disorder Mother   . Alcohol abuse Father   . Drug abuse Father   . Bipolar disorder Sister   . Alcohol abuse Brother   . Drug abuse Brother   . Bipolar disorder Paternal Aunt   . Schizophrenia Paternal Aunt   . Cancer Neg Hx   . Diabetes Neg Hx   . Heart disease Neg Hx   . Hyperlipidemia Neg Hx   . Stroke Neg Hx   . Suicidality Neg Hx     Social History:  Social History   Social History  . Marital status: Married    Spouse name: N/A  . Number of children: N/A  . Years of education: N/A   Social History Main Topics  . Smoking status: Current Every Day Smoker    Packs/day: 1.00    Years: 20.00    Types: Cigarettes  . Smokeless tobacco: Never Used  . Alcohol use No  . Drug use: No  . Sexual activity: Yes    Partners: Female    Birth control/ protection: None   Other Topics Concern  . Not on file   Social History Narrative  . No narrative on file    Allergies:  Allergies  Allergen Reactions  . Ciprofloxacin  Acute kidney failure    Metabolic Disorder Labs: Lab Results  Component Value Date   HGBA1C 5.1 04/24/2017   MPG 103 02/03/2014   Lab Results  Component Value Date   PROLACTIN 6.4 04/24/2017   Lab Results  Component Value Date   CHOL 141 04/24/2017   TRIG 319 (H) 04/24/2017   HDL 27 (L) 04/24/2017   CHOLHDL 5 01/21/2011   VLDL 34.8 01/21/2011   LDLCALC 50 04/24/2017   LDLCALC 77 01/21/2011   Lab Results  Component Value Date   TSH 2.020 04/24/2017   TSH 1.75 10/30/2016    Therapeutic Level Labs: Lab Results  Component Value Date   LITHIUM 0.9 04/24/2017   LITHIUM 0.7 (L) 10/30/2016   Lab Results  Component Value Date   VALPROATE 46 (L) 04/24/2017   No components found for:  CBMZ  Current  Medications: Current Outpatient Prescriptions  Medication Sig Dispense Refill  . baclofen (LIORESAL) 10 MG tablet Take 10 mg by mouth as needed for muscle spasms.    . busPIRone (BUSPAR) 15 MG tablet Take 1 tablet (15 mg total) by mouth 2 (two) times daily. 60 tablet 1  . celecoxib (CELEBREX) 100 MG capsule Take 100 mg by mouth as needed for mild pain.     . cyclobenzaprine (FLEXERIL) 10 MG tablet Take 1 tablet (10 mg total) by mouth 3 (three) times daily as needed. (Patient taking differently: Take 10 mg by mouth 3 (three) times daily as needed for muscle spasms. ) 30 tablet 0  . divalproex (DEPAKOTE ER) 250 MG 24 hr tablet Take one tab po qAM and 2 tabs po qPM 90 tablet 1  . gabapentin (NEURONTIN) 300 MG capsule Take 300 mg by mouth daily as needed. For pain    . HYDROcodone-acetaminophen (NORCO) 10-325 MG tablet Take 1-2 tablets by mouth every 6 (six) hours as needed. 15 tablet 0  . lithium carbonate (LITHOBID) 300 MG CR tablet Take 4 tablets (1,200 mg total) by mouth at bedtime. 120 tablet 1  . LORazepam (ATIVAN) 1 MG tablet Take 1 tablet (1 mg total) by mouth 2 (two) times daily as needed for anxiety. 60 tablet 1  . QUEtiapine (SEROQUEL) 100 MG tablet Take 1 tablet (100 mg total) by mouth at bedtime. 30 tablet 1  . tadalafil (CIALIS) 5 MG tablet Take 5 mg by mouth daily as needed for erectile dysfunction.     No current facility-administered medications for this visit.      Musculoskeletal: Strength & Muscle Tone: within normal limits Gait & Station: normal Patient leans: N/A  Psychiatric Specialty Exam: Review of Systems  Constitutional: Negative for chills, diaphoresis, fever and weight loss.  Psychiatric/Behavioral: Positive for depression. Negative for hallucinations, substance abuse and suicidal ideas. The patient is nervous/anxious. The patient does not have insomnia.     Blood pressure 128/72, pulse 99, height 6\' 5"  (1.956 m), weight 239 lb 6.4 oz (108.6 kg).Body mass index  is 28.39 kg/m.  General Appearance: Fairly Groomed  Eye Contact:  Good  Speech:  Clear and Coherent and Normal Rate  Volume:  Normal  Mood:  Depressed  Affect:  Congruent  Thought Process:  Goal Directed and Descriptions of Associations: Intact  Orientation:  Full (Time, Place, and Person)  Thought Content: Logical   Suicidal Thoughts:  No  Homicidal Thoughts:  No  Memory:  Immediate;   Good Recent;   Good Remote;   Good  Judgement:  Good  Insight:  Good  Psychomotor Activity:  Normal  Concentration:  Concentration: Good and Attention Span: Good  Recall:  Good  Fund of Knowledge: Good  Language: Good  Akathisia:  No  Handed:  Right  AIMS (if indicated): not done  Assets:  Communication Skills Desire for Improvement Financial Resources/Insurance Housing Intimacy Leisure Time Resilience Social Support Talents/Skills Transportation Vocational/Educational  ADL's:  Intact  Cognition: WNL  Sleep:  Good   Screenings: PHQ2-9     Office Visit from 12/22/2015 in [facility]  PHQ-2 Total Score  0       Assessment and Plan: Bipolar II-depressed; Panic 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; GAD   Medication management with supportive therapy. Risks/benefits and SE of the medication discussed. Pt verbalized understanding and verbal consent obtained for treatment.  Affirm with the patient that the medications are taken as ordered. Patient expressed understanding of how their medications were to be used.   Meds: Lithobid 1200mg  po qHs for Bipolar disorder Increase Depakote ER 250mg  po qAM and 500mg  po qHS for Bipolar disorder Decrease Seroquel 100mg  po qHS for Bipolar disorder and insomnia. Goal to d/c in the future. Buspar 15mg  BID for anxiety Ativan 1mg  po BID prn anxiety  Labs: reviewed labs 04/24/17: triglycered 319; Lih=thium level 0.9, Depakote 46  Therapy: brief supportive therapy provided. Discussed psychosocial stressors in detail.    Consultations:  Encouraged to follow up with PCP as needed  Pt denies SI and is at an acute low risk for suicide. Patient told to call clinic if any problems occur. Patient advised to go to ER if they should develop SI/HI, side effects, or if symptoms worsen. Has crisis numbers to call if needed. Pt verbalized understanding.  F/up in 2 months or sooner if needed   [name], MD 06/05/2017, 2:51 PM

## 2017-06-27 MED FILL — QUETIAPINE FUMARATE 100 MG: 100 | 30 days supply | Qty: 30 | Fill #0

## 2017-06-27 MED FILL — LITHIUM CARBONATE ER 300 MG: 300 | 30 days supply | Qty: 120 | Fill #2

## 2017-06-27 MED FILL — busPIRone HCL 15 MG TABS: 15 | 30 days supply | Qty: 60 | Fill #2

## 2017-06-27 MED FILL — LORazepam 1 MG TABS: 1 | 30 days supply | Qty: 60 | Fill #1

## 2017-06-30 MED FILL — DIVALPROEX SOD DR 250 MG TA: 250 | 30 days supply | Qty: 90 | Fill #1

## 2017-07-03 ENCOUNTER — Ambulatory Visit (HOSPITAL_COMMUNITY): Payer: 59 | Admitting: Psychiatry

## 2017-07-03 ENCOUNTER — Encounter (HOSPITAL_COMMUNITY): Payer: Self-pay | Admitting: Psychiatry

## 2017-07-03 DIAGNOSIS — F3181 Bipolar II disorder: Secondary | ICD-10-CM

## 2017-07-03 DIAGNOSIS — R450 Nervousness: Secondary | ICD-10-CM

## 2017-07-03 DIAGNOSIS — F410 Panic disorder [episodic paroxysmal anxiety] without agoraphobia: Secondary | ICD-10-CM

## 2017-07-03 DIAGNOSIS — Z813 Family history of other psychoactive substance abuse and dependence: Secondary | ICD-10-CM

## 2017-07-03 DIAGNOSIS — F411 Generalized anxiety disorder: Secondary | ICD-10-CM

## 2017-07-03 DIAGNOSIS — F17210 Nicotine dependence, cigarettes, uncomplicated: Secondary | ICD-10-CM | POA: Diagnosis not present

## 2017-07-03 DIAGNOSIS — Z818 Family history of other mental and behavioral disorders: Secondary | ICD-10-CM

## 2017-07-03 DIAGNOSIS — Z811 Family history of alcohol abuse and dependence: Secondary | ICD-10-CM

## 2017-07-03 MED ORDER — LITHIUM CARBONATE ER 300 MG PO TBCR: 1200.0000 mg | EXTENDED_RELEASE_TABLET | Freq: Every day | ORAL | 2 refills | Status: DC

## 2017-07-03 MED ORDER — DIVALPROEX SODIUM ER 250 MG PO TB24: ORAL_TABLET | ORAL | 2 refills | Status: DC

## 2017-07-03 MED ORDER — LORAZEPAM 1 MG PO TABS: 1.0000 mg | ORAL_TABLET | Freq: Two times a day (BID) | ORAL | 2 refills | Status: DC | PRN

## 2017-07-03 MED ORDER — BUSPIRONE HCL 15 MG PO TABS: 15.0000 mg | ORAL_TABLET | Freq: Two times a day (BID) | ORAL | 2 refills | Status: DC

## 2017-07-03 NOTE — Progress Notes (Signed)
BH MD/PA/NP OP Progress Note  07/03/2017 4:12 PM Matthew [name]  MRN:  [id number]  Chief Complaint:  Chief Complaint    Follow-up     HPI: Patient reports that he is doing much better overall.  He reports that he stopped Seroquel 1 week ago and has not noted any withdrawal symptoms or change in mood.  He is sleeping well and energy has improved.  Mood is level. He has some minor depression but feels that it is improving.  Matthew [name] states that he is more motivated and has actually started feeling more more goal oriented.  He is working on his dissertation and some tasks at work.  He no longer feels apathetic and feels that he is more productive overall.  Matthew [name] denies suicidal and homicidal ideations.  He discovered earlier this week that a neighbor killed his 40 year old son and then committed suicide.  Patient states it is really caused him to feel somewhat down and he thought about it a lot for the first few days but it is slowly starting to improve.  Pt denies manic and hypomanic symptoms including periods of decreased need for sleep, increased energy, mood lability, impulsivity, FOI, and excessive spending.  Anxiety is mild and tolerable.  He denies panic attacks.  We discussed some stressors related to the upcoming holidays.  I made some suggestions about changing some holiday traditions and scheduling vacation time.  Pt states-taking meds as prescribed and denies SE.    Visit Diagnosis:    ICD-10-CM   1. GAD (generalized anxiety disorder) F41.1 busPIRone (BUSPAR) 15 MG tablet    LORazepam (ATIVAN) 1 MG tablet  2. Bipolar 2 disorder (HCC) F31.81 divalproex (DEPAKOTE ER) 250 MG 24 hr tablet    lithium carbonate (LITHOBID) 300 MG CR tablet  3. Panic 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 F41.0 LORazepam (ATIVAN) 1 MG tablet    Past Psychiatric History:  Anxiety: Yes Bipolar Disorder: No Depression: Yes Mania: No Psychosis: No Schizophrenia: No Personality Disorder:  No Hospitalization for psychiatric illness: No History of Electroconvulsive Shock Therapy: No Prior Suicide Attempts: No   Past Medical History:  Past Medical History:  Diagnosis Date  . Acute kidney failure (HCC) 01/2012  . Anxiety   . Bipolar disorder (HCC)    hypomania  . COPD (chronic obstructive pulmonary disease) (HCC)   . Depression   . Low back pain     Past Surgical History:  Procedure Laterality Date  . NO PAST SURGERIES      Family Psychiatric History:  Family History  Problem Relation Age of Onset  . Hypertension Other   . Anxiety disorder Mother   . Alcohol abuse Father   . Drug abuse Father   . Bipolar disorder Sister   . Alcohol abuse Brother   . Drug abuse Brother   . Bipolar disorder Paternal Aunt   . Schizophrenia Paternal Aunt   . Cancer Neg Hx   . Diabetes Neg Hx   . Heart disease Neg Hx   . Hyperlipidemia Neg Hx   . Stroke Neg Hx   . Suicidality Neg Hx     Social History:  Social History   Socioeconomic History  . Marital status: Married    Spouse name: None  . Number of children: None  . Years of education: None  . Highest education level: None  Social Needs  . Financial resource strain: None  . Food insecurity - worry: None  . Food insecurity - inability: None  . Transportation needs -  medical: None  . Transportation needs - non-medical: None  Occupational History  . None  Tobacco Use  . Smoking status: Current Every Day Smoker    Packs/day: 1.00    Years: 20.00    Pack years: 20.00    Types: Cigarettes  . Smokeless tobacco: Never Used  Substance and Sexual Activity  . Alcohol use: No  . Drug use: No  . Sexual activity: Yes    Partners: Female    Birth control/protection: None  Other Topics Concern  . None  Social History Narrative  . None    Allergies:  Allergies  Allergen Reactions  . Ciprofloxacin     Acute kidney failure    Metabolic Disorder Labs: Lab Results  Component Value Date   HGBA1C 5.1  04/24/2017   MPG 103 02/03/2014   Lab Results  Component Value Date   PROLACTIN 6.4 04/24/2017   Lab Results  Component Value Date   CHOL 141 04/24/2017   TRIG 319 (H) 04/24/2017   HDL 27 (L) 04/24/2017   CHOLHDL 5 01/21/2011   VLDL 34.8 01/21/2011   LDLCALC 50 04/24/2017   LDLCALC 77 01/21/2011   Lab Results  Component Value Date   TSH 2.020 04/24/2017   TSH 1.75 10/30/2016    Therapeutic Level Labs: Lab Results  Component Value Date   LITHIUM 0.9 04/24/2017   LITHIUM 0.7 (L) 10/30/2016   Lab Results  Component Value Date   VALPROATE 46 (L) 04/24/2017   No components found for:  CBMZ  Current Medications: Current Outpatient Medications  Medication Sig Dispense Refill  . baclofen (LIORESAL) 10 MG tablet Take 10 mg by mouth as needed for muscle spasms.    . busPIRone (BUSPAR) 15 MG tablet Take 1 tablet (15 mg total) 2 (two) times daily by mouth. 60 tablet 2  . celecoxib (CELEBREX) 100 MG capsule Take 100 mg by mouth as needed for mild pain.     . cyclobenzaprine (FLEXERIL) 10 MG tablet Take 1 tablet (10 mg total) by mouth 3 (three) times daily as needed. (Patient taking differently: Take 10 mg by mouth 3 (three) times daily as needed for muscle spasms. ) 30 tablet 0  . divalproex (DEPAKOTE ER) 250 MG 24 hr tablet Take one tab po qAM and 2 tabs po qPM 90 tablet 2  . gabapentin (NEURONTIN) 300 MG capsule Take 300 mg by mouth daily as needed. For pain    . HYDROcodone-acetaminophen (NORCO) 10-325 MG tablet Take 1-2 tablets by mouth every 6 (six) hours as needed. 15 tablet 0  . lithium carbonate (LITHOBID) 300 MG CR tablet Take 4 tablets (1,200 mg total) at bedtime by mouth. 120 tablet 2  . LORazepam (ATIVAN) 1 MG tablet Take 1 tablet (1 mg total) 2 (two) times daily as needed by mouth for anxiety. 60 tablet 2  . tadalafil (CIALIS) 5 MG tablet Take 5 mg by mouth daily as needed for erectile dysfunction.     No current facility-administered medications for this visit.       Musculoskeletal: Strength & Muscle Tone: within normal limits Gait & Station: normal Patient leans: N/A  Psychiatric Specialty Exam: Review of Systems  Constitutional: Negative for chills, diaphoresis and fever.  HENT: Negative for congestion, hearing loss, sinus pain and sore throat.   Neurological: Negative for weakness.    Blood pressure 124/70, pulse 97, height 6\' 5"  (1.956 m), weight 246 lb (111.6 kg), SpO2 98 %.Body mass index is 29.17 kg/m.  General Appearance: Well Groomed  Eye Contact:  Good  Speech:  Clear and Coherent and Normal Rate  Volume:  Normal  Mood:  Euthymic  Affect:  Full Range  Thought Process:  Goal Directed and Descriptions of Associations: Intact  Orientation:  Full (Time, Place, and Person)  Thought Content: Logical   Suicidal Thoughts:  No  Homicidal Thoughts:  No  Memory:  Immediate;   Good Recent;   Good Remote;   Good  Judgement:  Good  Insight:  Good  Psychomotor Activity:  Normal  Concentration:  Concentration: Good and Attention Span: Good  Recall:  Good  Fund of Knowledge: Good  Language: Good  Akathisia:  No  Handed:  Right  AIMS (if indicated): not done  Assets:  Communication Skills Desire for Improvement Financial Resources/Insurance Housing Intimacy Resilience Social Support Talents/Skills Transportation Vocational/Educational  ADL's:  Intact  Cognition: WNL  Sleep:  Good   Screenings: PHQ2-9     [facility] Visit from 12/22/2015 in [facility]  PHQ-2 Total Score  0       Assessment and Plan: Bipolar II d/o- depressed; Panic 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; GAD    Medication management with supportive therapy. Risks/benefits and SE of the medication discussed. Pt verbalized understanding and verbal consent obtained for treatment.  Affirm with the patient that the medications are taken as ordered. Patient expressed understanding of how their medications were to be used.   Meds: Lithobid 1200mg  po  qHs for Bipolar disorder Increase Depakote ER 250mg  po qAM and 500mg  po qHS for Bipolar disorder D/c Seroquel  Buspar 15mg  BID for anxiety Ativan 1mg  po BID prn anxiety    Labs: none  Therapy: brief supportive therapy provided. Discussed psychosocial stressors in detail.     Consultations: none  Pt denies SI and is at an acute low risk for suicide. Patient told to call clinic if any problems occur. Patient advised to go to ER if they should develop SI/HI, side effects, or if symptoms worsen. Has crisis numbers to call if needed. Pt verbalized understanding.  F/up in 2 months or sooner if needed    Oletta DarterSalina Jens Siems, MD 07/03/2017, 4:12 PM

## 2017-07-28 MED FILL — LITHIUM CARBONATE ER 300 MG: 300 | 30 days supply | Qty: 120 | Fill #0

## 2017-07-29 ENCOUNTER — Telehealth (HOSPITAL_COMMUNITY): Payer: Self-pay

## 2017-07-29 MED FILL — DIVALPROEX SOD DR 250 MG TA: 250 | 30 days supply | Qty: 90 | Fill #0

## 2017-07-29 MED FILL — LORazepam 1 MG TABS: 1 | 30 days supply | Qty: 60 | Fill #0

## 2017-07-29 MED FILL — busPIRone HCL 7.5 MG TABS: 7.5 | 30 days supply | Qty: 120 | Fill #0

## 2017-07-29 NOTE — Telephone Encounter (Signed)
Pharmacy called to see if they could fill patients Buspar for 7.5 mg 2 tabs twice a day to equal the prescribed 15 mg bid. The 15 mg is currently on back order. I gave the okay to do so and they will discuss with patient when he picks up prescription.

## 2017-08-14 DIAGNOSIS — M5126 Other intervertebral disc displacement, lumbar region: Secondary | ICD-10-CM | POA: Diagnosis not present

## 2017-08-27 ENCOUNTER — Telehealth (HOSPITAL_COMMUNITY): Payer: Self-pay

## 2017-08-27 MED FILL — LITHIUM CARBONATE ER 300 MG: 300 | 30 days supply | Qty: 120 | Fill #1

## 2017-08-27 MED FILL — busPIRone HCL 7.5 MG TABS: 7.5 | 30 days supply | Qty: 120 | Fill #1

## 2017-08-27 MED FILL — LORazepam 1 MG TABS: 1 | 30 days supply | Qty: 60 | Fill #1

## 2017-08-27 NOTE — Telephone Encounter (Signed)
Brett CanalesSteve, pharmacist at [facility] outpatient called, he states that they have been filling the patients prescription as DR instead of ER which is what we are prescribing. He wants to know if you are okay with leaving patient on the DR. Please review and advise, thank you

## 2017-08-28 NOTE — Telephone Encounter (Signed)
Yes. I don't want to switch him if he is stable. The pharmacy needs to read the scripts more carefully in the future.

## 2017-09-01 MED FILL — DIVALPROEX SOD DR 250 MG TA: 250 | 30 days supply | Qty: 90 | Fill #1

## 2017-09-04 ENCOUNTER — Ambulatory Visit (INDEPENDENT_AMBULATORY_CARE_PROVIDER_SITE_OTHER): Payer: 59 | Admitting: Psychiatry

## 2017-09-04 ENCOUNTER — Encounter (HOSPITAL_COMMUNITY): Payer: Self-pay | Admitting: Psychiatry

## 2017-09-04 VITALS — BP 138/85 | HR 89 | Ht 77.0 in | Wt 240.0 lb

## 2017-09-04 DIAGNOSIS — F17210 Nicotine dependence, cigarettes, uncomplicated: Secondary | ICD-10-CM

## 2017-09-04 DIAGNOSIS — Z818 Family history of other mental and behavioral disorders: Secondary | ICD-10-CM | POA: Diagnosis not present

## 2017-09-04 DIAGNOSIS — Z566 Other physical and mental strain related to work: Secondary | ICD-10-CM | POA: Diagnosis not present

## 2017-09-04 DIAGNOSIS — F5105 Insomnia due to other mental disorder: Secondary | ICD-10-CM | POA: Diagnosis not present

## 2017-09-04 DIAGNOSIS — Z813 Family history of other psychoactive substance abuse and dependence: Secondary | ICD-10-CM

## 2017-09-04 DIAGNOSIS — F410 Panic disorder [episodic paroxysmal anxiety] without agoraphobia: Secondary | ICD-10-CM

## 2017-09-04 DIAGNOSIS — F411 Generalized anxiety disorder: Secondary | ICD-10-CM | POA: Diagnosis not present

## 2017-09-04 DIAGNOSIS — Z811 Family history of alcohol abuse and dependence: Secondary | ICD-10-CM

## 2017-09-04 DIAGNOSIS — F3181 Bipolar II disorder: Secondary | ICD-10-CM | POA: Diagnosis not present

## 2017-09-04 DIAGNOSIS — F99 Mental disorder, not otherwise specified: Secondary | ICD-10-CM

## 2017-09-04 MED ORDER — DIVALPROEX SODIUM ER 250 MG PO TB24: ORAL_TABLET | ORAL | 2 refills | Status: DC

## 2017-09-04 MED ORDER — LORAZEPAM 1 MG PO TABS: 1.0000 mg | ORAL_TABLET | Freq: Three times a day (TID) | ORAL | 2 refills | Status: DC | PRN

## 2017-09-04 MED ORDER — BUSPIRONE HCL 15 MG PO TABS: 15.0000 mg | ORAL_TABLET | Freq: Two times a day (BID) | ORAL | 2 refills | Status: DC

## 2017-09-04 MED ORDER — QUETIAPINE FUMARATE ER 50 MG PO TB24: 100.0000 mg | ORAL_TABLET | Freq: Every day | ORAL | 2 refills | Status: DC

## 2017-09-04 MED ORDER — LITHIUM CARBONATE ER 300 MG PO TBCR: 1200.0000 mg | EXTENDED_RELEASE_TABLET | Freq: Every day | ORAL | 2 refills | Status: DC

## 2017-09-04 NOTE — Progress Notes (Signed)
BH MD/PA/NP OP Progress Note  09/04/2017 3:39 PM Rachael DarbyRobert L Heathman III  MRN:  [id number]  Chief Complaint:  Chief Complaint    Anxiety; Follow-up     HPI: Patient reports that his depression and anxiety started to worsen in mid December.  He was not able to take his vacation days as planned due to work issues.  Patient reports that his anxiety has dramatically increased due to stress at work.  He is taking on a large number of projects and there is some reorganization going on this has changed the tone of the overall office and he is feeling overwhelmed.  He often has work that he has to do at home after hours and on weekends.  He finds himself dreading going into work and [employer] meetings.  He is worried that he will be fired and feels as though things are slipping through his fingers.  It affects his sleep so that it is not restful.  The Ativan helps some at night but not enough.  He is taking it during the day and it also helps at work.  He also thinks that the BuSpar is helping some.  The anxiety has caused the depression to worsen.  He feels as though he is not as goal oriented or motivated as he used to be.  He has on and off passive thoughts of death but denies any suicidal plans or intent.  His wife has noticed that he is very stressed.  Patient notes that he is much more irritable than he used to be in his ability to deal with others is decreasing.  He denies any manic or hypomanic-like symptoms.  He states that "I wish I was a little manic."  Pt states-taking meds as prescribed and denies SE.   Visit Diagnosis:    ICD-10-CM   1. GAD (generalized anxiety disorder) F41.1 busPIRone (BUSPAR) 15 MG tablet    LORazepam (ATIVAN) 1 MG tablet    QUEtiapine (SEROQUEL XR) 50 MG TB24 24 hr tablet  2. Bipolar 2 disorder (HCC) F31.81 divalproex (DEPAKOTE ER) 250 MG 24 hr tablet    lithium carbonate (LITHOBID) 300 MG CR tablet    QUEtiapine (SEROQUEL XR) 50 MG TB24 24 hr tablet  3. Insomnia due  to other mental disorder F51.05 QUEtiapine (SEROQUEL XR) 50 MG TB24 24 hr tablet   F99   4. Panic 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 F41.0 LORazepam (ATIVAN) 1 MG tablet      Past Psychiatric History:  Anxiety: Yes Bipolar Disorder: No Depression: Yes Mania: No Psychosis: No Schizophrenia: No Personality Disorder: No Hospitalization for psychiatric illness: No History of Electroconvulsive Shock Therapy: No Prior Suicide Attempts: No   Past Medical History:  Past Medical History:  Diagnosis Date  . Acute kidney failure (HCC) 01/2012  . Anxiety   . Bipolar disorder (HCC)    hypomania  . COPD (chronic obstructive pulmonary disease) (HCC)   . Depression   . Low back pain     Past Surgical History:  Procedure Laterality Date  . NO PAST SURGERIES      Family Psychiatric History:  Family History  Problem Relation Age of Onset  . Hypertension Other   . Anxiety disorder Mother   . Alcohol abuse Father   . Drug abuse Father   . Bipolar disorder Sister   . Alcohol abuse Brother   . Drug abuse Brother   . Bipolar disorder Paternal Aunt   . Schizophrenia Paternal Aunt   . Cancer Neg Hx   .  Diabetes Neg Hx   . Heart disease Neg Hx   . Hyperlipidemia Neg Hx   . Stroke Neg Hx   . Suicidality Neg Hx     Social History:  Social History   Socioeconomic History  . Marital status: Married    Spouse name: Not on file  . Number of children: Not on file  . Years of education: Not on file  . Highest education level: Not on file  Social Needs  . Financial resource strain: Not on file  . Food insecurity - worry: Not on file  . Food insecurity - inability: Not on file  . Transportation needs - medical: Not on file  . Transportation needs - non-medical: Not on file  Occupational History  . Not on file  Tobacco Use  . Smoking status: Current Every Day Smoker    Packs/day: 1.00    Years: 20.00    Pack years: 20.00    Types: Cigarettes  . Smokeless tobacco: Never Used   Substance and Sexual Activity  . Alcohol use: No  . Drug use: No  . Sexual activity: Yes    Partners: Female    Birth control/protection: None  Other Topics Concern  . Not on file  Social History Narrative  . Not on file    Allergies:  Allergies  Allergen Reactions  . Ciprofloxacin     Acute kidney failure    Metabolic Disorder Labs: Lab Results  Component Value Date   HGBA1C 5.1 04/24/2017   MPG 103 02/03/2014   Lab Results  Component Value Date   PROLACTIN 6.4 04/24/2017   Lab Results  Component Value Date   CHOL 141 04/24/2017   TRIG 319 (H) 04/24/2017   HDL 27 (L) 04/24/2017   CHOLHDL 5 01/21/2011   VLDL 34.8 01/21/2011   LDLCALC 50 04/24/2017   LDLCALC 77 01/21/2011   Lab Results  Component Value Date   TSH 2.020 04/24/2017   TSH 1.75 10/30/2016    Therapeutic Level Labs: Lab Results  Component Value Date   LITHIUM 0.9 04/24/2017   LITHIUM 0.7 (L) 10/30/2016   Lab Results  Component Value Date   VALPROATE 46 (L) 04/24/2017   No components found for:  CBMZ  Current Medications: Current Outpatient Medications  Medication Sig Dispense Refill  . baclofen (LIORESAL) 10 MG tablet Take 10 mg by mouth as needed for muscle spasms.    . busPIRone (BUSPAR) 15 MG tablet Take 1 tablet (15 mg total) 2 (two) times daily by mouth. 60 tablet 2  . celecoxib (CELEBREX) 100 MG capsule Take 100 mg by mouth as needed for mild pain.     . cyclobenzaprine (FLEXERIL) 10 MG tablet Take 1 tablet (10 mg total) by mouth 3 (three) times daily as needed. (Patient taking differently: Take 10 mg by mouth 3 (three) times daily as needed for muscle spasms. ) 30 tablet 0  . divalproex (DEPAKOTE ER) 250 MG 24 hr tablet Take one tab po qAM and 2 tabs po qPM 90 tablet 2  . gabapentin (NEURONTIN) 300 MG capsule Take 300 mg by mouth daily as needed. For pain    . HYDROcodone-acetaminophen (NORCO) 10-325 MG tablet Take 1-2 tablets by mouth every 6 (six) hours as needed. 15 tablet 0  .  lithium carbonate (LITHOBID) 300 MG CR tablet Take 4 tablets (1,200 mg total) at bedtime by mouth. 120 tablet 2  . LORazepam (ATIVAN) 1 MG tablet Take 1 tablet (1 mg total) 2 (two) times daily  as needed by mouth for anxiety. 60 tablet 2  . tadalafil (CIALIS) 5 MG tablet Take 5 mg by mouth daily as needed for erectile dysfunction.     No current facility-administered medications for this visit.      Musculoskeletal: Strength & Muscle Tone: within normal limits Gait & Station: normal Patient leans: N/A  Psychiatric Specialty Exam: Review of Systems  Constitutional: Negative for chills and fever.  HENT: Negative for congestion, ear pain, sinus pain and sore throat.   Neurological: Negative for weakness.    Blood pressure 138/85, pulse 89, height 6\' 5"  (1.956 m), weight 240 lb (108.9 kg).Body mass index is 28.46 kg/m.  General Appearance: Fairly Groomed  Eye Contact:  Good  Speech:  Clear and Coherent and Normal Rate  Volume:  Normal  Mood:  Anxious and Depressed  Affect:  Congruent  Thought Process:  Coherent and Descriptions of Associations: Circumstantial  Orientation:  Full (Time, Place, and Person)  Thought Content: Rumination   Suicidal Thoughts:  No  Homicidal Thoughts:  No  Memory:  Immediate;   Good Recent;   Good Remote;   Good  Judgement:  Good  Insight:  Good  Psychomotor Activity:  Normal  Concentration:  Concentration: Good and Attention Span: Good  Recall:  Good  Fund of Knowledge: Good  Language: Good  Akathisia:  No  Handed:  Right  AIMS (if indicated): not done  Assets:  Communication Skills Desire for Improvement Financial Resources/Insurance Housing Social Support Talents/Skills Transportation Vocational/Educational  ADL's:  Intact  Cognition: WNL  Sleep:  Poor   Screenings: PHQ2-9     Office Visit from 12/22/2015 in [facility]  PHQ-2 Total Score  0      I reviewed information below on 09/04/17 and same as previous  visits except as noted  Assessment and Plan: Bipolar II d/o- depressed-worsening; Panic 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-stable; GAD-worsening; Insomnia- worsening      Medication management with supportive therapy. Risks/benefits and SE of the medication discussed. Pt verbalized understanding and verbal consent obtained for treatment.  Affirm with the patient that the medications are taken as ordered. Patient expressed understanding of how their medications were to be used.    Meds: Lithobid 1200mg  po qHs for Bipolar disorder Depakote ER 250mg  po qAM and 500mg  po qHS for Bipolar disorder Buspar 15mg  BID for GAD Increase Ativan 1mg  po TID prn anxiety for GAD Restart Seroquel XR 100mg  po qHS for Bipolar disorder, GAD and insomnia as pt reports increase in symptoms       Labs: none   Therapy: brief supportive therapy provided. Discussed psychosocial stressors in detail.   I stressed the need for patient to find some ways to relax and cope. He is going to come up with a list of activities and we will discuss his progress at the next appt.    Consultations: none   Pt denies SI and is at an acute low risk for suicide. Patient told to call clinic if any problems occur. Patient advised to go to ER if they should develop SI/HI, side effects, or if symptoms worsen. Has crisis numbers to call if needed. Pt verbalized understanding.   F/up in  6 weeks or sooner if needed    [name], MD 09/04/2017, 3:39 PM

## 2017-09-23 ENCOUNTER — Other Ambulatory Visit (HOSPITAL_COMMUNITY): Payer: Self-pay

## 2017-09-23 DIAGNOSIS — F99 Mental disorder, not otherwise specified: Secondary | ICD-10-CM

## 2017-09-23 DIAGNOSIS — F3181 Bipolar II disorder: Secondary | ICD-10-CM

## 2017-09-23 DIAGNOSIS — F411 Generalized anxiety disorder: Secondary | ICD-10-CM

## 2017-09-23 DIAGNOSIS — F5105 Insomnia due to other mental disorder: Secondary | ICD-10-CM

## 2017-09-23 MED ORDER — QUETIAPINE FUMARATE ER 50 MG PO TB24: 50.0000 mg | ORAL_TABLET | Freq: Every day | ORAL | 2 refills | Status: DC

## 2017-09-23 MED FILL — QUETIAPINE ER 50 MG TABLET: 50 | 30 days supply | Qty: 30 | Fill #0

## 2017-09-26 MED FILL — LORazepam 1 MG TABS: 1 | 30 days supply | Qty: 60 | Fill #2

## 2017-09-26 MED FILL — busPIRone HCL 7.5 MG TABS: 7.5 | 30 days supply | Qty: 120 | Fill #2

## 2017-09-26 MED FILL — DIVALPROEX SOD DR 250 MG TA: 250 | 30 days supply | Qty: 90 | Fill #2

## 2017-09-26 MED FILL — LITHIUM CARBONATE ER 300 MG: 300 | 30 days supply | Qty: 120 | Fill #2

## 2017-10-16 ENCOUNTER — Encounter (HOSPITAL_COMMUNITY): Payer: Self-pay | Admitting: Psychiatry

## 2017-10-16 ENCOUNTER — Ambulatory Visit (INDEPENDENT_AMBULATORY_CARE_PROVIDER_SITE_OTHER): Payer: 59 | Admitting: Psychiatry

## 2017-10-16 DIAGNOSIS — F410 Panic disorder [episodic paroxysmal anxiety] without agoraphobia: Secondary | ICD-10-CM

## 2017-10-16 DIAGNOSIS — Z813 Family history of other psychoactive substance abuse and dependence: Secondary | ICD-10-CM | POA: Diagnosis not present

## 2017-10-16 DIAGNOSIS — F99 Mental disorder, not otherwise specified: Secondary | ICD-10-CM | POA: Diagnosis not present

## 2017-10-16 DIAGNOSIS — Z6379 Other stressful life events affecting family and household: Secondary | ICD-10-CM | POA: Diagnosis not present

## 2017-10-16 DIAGNOSIS — Z818 Family history of other mental and behavioral disorders: Secondary | ICD-10-CM

## 2017-10-16 DIAGNOSIS — F5105 Insomnia due to other mental disorder: Secondary | ICD-10-CM

## 2017-10-16 DIAGNOSIS — F411 Generalized anxiety disorder: Secondary | ICD-10-CM

## 2017-10-16 DIAGNOSIS — Z811 Family history of alcohol abuse and dependence: Secondary | ICD-10-CM | POA: Diagnosis not present

## 2017-10-16 DIAGNOSIS — F3181 Bipolar II disorder: Secondary | ICD-10-CM

## 2017-10-16 DIAGNOSIS — F17210 Nicotine dependence, cigarettes, uncomplicated: Secondary | ICD-10-CM

## 2017-10-16 DIAGNOSIS — R450 Nervousness: Secondary | ICD-10-CM | POA: Diagnosis not present

## 2017-10-16 MED ORDER — TADALAFIL 5 MG PO TABS: 5.0000 mg | ORAL_TABLET | Freq: Every day | ORAL | 1 refills | Status: DC | PRN

## 2017-10-16 NOTE — Progress Notes (Signed)
BH MD/PA/NP OP Progress Note  10/16/2017 4:55 PM Matthew [name]  MRN:  [id number]  Chief Complaint:  Chief Complaint    Manic Behavior; Anxiety; Follow-up     HPI: He reports he stressed, overwhelmed and fed up.  He feels very frustrated by the number of stressors he has going on in his life and feels that he gets no break.  Irritated by various family members and responsibilities.  He often finds himself thinking about ways to get out but has no desire to act on them. His depression is overall unchanged and he continues to feel anxious despite taking Ativan.  He stopped the Seroquel as it was causing overwhelming, unrelenting fatigue throughout the day.  Sleep is good.  Patient reports that he has been having ED for the last 6 months and it is embarrassing and frustrating for him.  His relationship with his wife is stressed. He has on and off SI without plan or intent a few times a week. The last time was earlier this week.   He denies HI. He denies any manic or hypomanic symptoms.  Matthew [name] took 3 days off at the recommendation of his psychiatrist for stress reduction.   Visit Diagnosis:    ICD-10-CM   1. GAD (generalized anxiety disorder) F41.1   2. Bipolar 2 disorder (HCC) F31.81   3. Panic 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 F41.0   4. Insomnia due to other mental disorder F51.05    F99     Past Psychiatric History:  Anxiety: Yes Bipolar Disorder: No Depression: Yes Mania: No Psychosis: No Schizophrenia: No Personality Disorder: No Hospitalization for psychiatric illness: No History of Electroconvulsive Shock Therapy: No Prior Suicide Attempts: No   Past Medical History:  Past Medical History:  Diagnosis Date  . Acute kidney failure (HCC) 01/2012  . Anxiety   . Bipolar disorder (HCC)    hypomania  . COPD (chronic obstructive pulmonary disease) (HCC)   . Depression   . Low back pain     Past Surgical History:  Procedure Laterality Date  . NO PAST SURGERIES       Family Psychiatric History:  Family History  Problem Relation Age of Onset  . Hypertension Other   . Anxiety disorder Mother   . Alcohol abuse Father   . Drug abuse Father   . Bipolar disorder Sister   . Alcohol abuse Brother   . Drug abuse Brother   . Bipolar disorder Paternal Aunt   . Schizophrenia Paternal Aunt   . Cancer Neg Hx   . Diabetes Neg Hx   . Heart disease Neg Hx   . Hyperlipidemia Neg Hx   . Stroke Neg Hx   . Suicidality Neg Hx     Social History:  Social History   Socioeconomic History  . Marital status: Married    Spouse name: None  . Number of children: None  . Years of education: None  . Highest education level: None  Social Needs  . Financial resource strain: None  . Food insecurity - worry: None  . Food insecurity - inability: None  . Transportation needs - medical: None  . Transportation needs - non-medical: None  Occupational History  . None  Tobacco Use  . Smoking status: Current Every Day Smoker    Packs/day: 1.00    Years: 20.00    Pack years: 20.00    Types: Cigarettes  . Smokeless tobacco: Never Used  Substance and Sexual Activity  . Alcohol use: No  .  Drug use: No  . Sexual activity: Yes    Partners: Female    Birth control/protection: None  Other Topics Concern  . None  Social History Narrative  . None    Allergies:  Allergies  Allergen Reactions  . Ciprofloxacin     Acute kidney failure    Metabolic Disorder Labs: Lab Results  Component Value Date   HGBA1C 5.1 04/24/2017   MPG 103 02/03/2014   Lab Results  Component Value Date   PROLACTIN 6.4 04/24/2017   Lab Results  Component Value Date   CHOL 141 04/24/2017   TRIG 319 (H) 04/24/2017   HDL 27 (L) 04/24/2017   CHOLHDL 5 01/21/2011   VLDL 34.8 01/21/2011   LDLCALC 50 04/24/2017   LDLCALC 77 01/21/2011   Lab Results  Component Value Date   TSH 2.020 04/24/2017   TSH 1.75 10/30/2016    Therapeutic Level Labs: Lab Results  Component Value Date    LITHIUM 0.9 04/24/2017   LITHIUM 0.7 (L) 10/30/2016   Lab Results  Component Value Date   VALPROATE 46 (L) 04/24/2017   No components found for:  CBMZ  Current Medications: Current Outpatient Medications  Medication Sig Dispense Refill  . baclofen (LIORESAL) 10 MG tablet Take 10 mg by mouth as needed for muscle spasms.    . busPIRone (BUSPAR) 15 MG tablet Take 1 tablet (15 mg total) by mouth 2 (two) times daily. 60 tablet 2  . celecoxib (CELEBREX) 100 MG capsule Take 100 mg by mouth as needed for mild pain.     . cyclobenzaprine (FLEXERIL) 10 MG tablet Take 1 tablet (10 mg total) by mouth 3 (three) times daily as needed. (Patient taking differently: Take 10 mg by mouth 3 (three) times daily as needed for muscle spasms. ) 30 tablet 0  . divalproex (DEPAKOTE ER) 250 MG 24 hr tablet Take one tab po qAM and 2 tabs po qPM 90 tablet 2  . gabapentin (NEURONTIN) 300 MG capsule Take 300 mg by mouth daily as needed. For pain    . HYDROcodone-acetaminophen (NORCO) 10-325 MG tablet Take 1-2 tablets by mouth every 6 (six) hours as needed. 15 tablet 0  . lithium carbonate (LITHOBID) 300 MG CR tablet Take 4 tablets (1,200 mg total) by mouth at bedtime. 120 tablet 2  . LORazepam (ATIVAN) 1 MG tablet Take 1 tablet (1 mg total) by mouth 3 (three) times daily as needed for anxiety. 90 tablet 2  . QUEtiapine (SEROQUEL XR) 50 MG TB24 24 hr tablet Take 1 tablet (50 mg total) by mouth at bedtime. 30 tablet 2  . tadalafil (CIALIS) 5 MG tablet Take 1 tablet (5 mg total) by mouth daily as needed for erectile dysfunction. 10 tablet 1   No current facility-administered medications for this visit.      Musculoskeletal: Strength & Muscle Tone: within normal limits Gait & Station: normal Patient leans: N/A  Psychiatric Specialty Exam: Review of Systems  Constitutional: Negative for chills, diaphoresis and fever.  Psychiatric/Behavioral: Positive for depression. Negative for hallucinations, substance abuse  and suicidal ideas. The patient is nervous/anxious. The patient does not have insomnia.     Blood pressure 128/72, pulse 84, height 6\' 5"  (1.956 m), weight 241 lb 6.4 oz (109.5 kg).Body mass index is 28.63 kg/m.  General Appearance: Casual  Eye Contact:  Good  Speech:  Clear and Coherent and Normal Rate  Volume:  Normal  Mood:  Anxious and Depressed  Affect:  Full Range  Thought Process:  Coherent and Descriptions of Associations: Intact  Orientation:  Full (Time, Place, and Person)  Thought Content: Rumination   Suicidal Thoughts:  No  Homicidal Thoughts:  No  Memory:  Immediate;   Good Recent;   Good Remote;   Good  Judgement:  Good  Insight:  Good  Psychomotor Activity:  Normal  Concentration:  Concentration: Good and Attention Span: Good  Recall:  Good  Fund of Knowledge: Good  Language: Good  Akathisia:  No  Handed:  Right  AIMS (if indicated): not done  Assets:  Communication Skills Desire for Improvement Housing  ADL's:  Intact  Cognition: WNL  Sleep:  Good   Screenings: PHQ2-9     Office Visit from 12/22/2015 in [facility]  PHQ-2 Total Score  0       Assessment and Plan: Bipolar II d/o- depressed; Panic 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; GAD; Insomnia    Medication management with supportive therapy. Risks and benefits, side effects and alternative treatment options discussed with patient. Pt was given an opportunity to ask questions about medication, illness, and treatment. All current psychiatric medications have been reviewed and discussed with the patient and adjusted as clinically appropriate. The patient has been provided an accurate and updated list of the medications being now prescribed. Patient expressed understanding of how their medications were to be used.  Pt verbalized understanding and verbal consent obtained for treatment. Status of current problems: worsening  Meds: Lithobid 1200mg  po qHS for Bipolar disorder Depakote ER  250mg  po qAM and 500mg  po qHS for Bipolar disorder Buspar 15mg  po BID for GAD Ativan 1mg  po TID prn anxiety for GAD D/c Seroquel due to overwhelming fatigue   Labs: none  Therapy: brief supportive therapy provided. Discussed psychosocial stressors in detail.   Discussed the importance of self care and came up with several possible ways to decrease his stress and responsibilities  Consultations: Encouraged to follow up with PCP as needed  Pt denies SI and is at an acute low risk for suicide. Patient told to call clinic if any problems occur. Patient advised to go to ER if they should develop SI/HI, side effects, or if symptoms worsen. Has crisis numbers to call if needed. Pt verbalized understanding.  F/up in 6 weeks sooner if needed    [name], MD 10/16/2017, 4:55 PM

## 2017-10-23 ENCOUNTER — Telehealth (HOSPITAL_COMMUNITY): Payer: Self-pay

## 2017-10-23 NOTE — Telephone Encounter (Signed)
Medication management - Telephone call with Matthew [name], representative with [employer] to assist pt with updating prior authorization for Tadalafil as he was denied previously. Informed, per patient pt had tried Sildenafil in the past and was ineffective.  Provided further clinical history and information related to patient's current medications attributing to this side effect.  Prior authorization updated for resubmission by representative as he reported a decision should be received within 24-72 standard hours.

## 2017-10-27 MED FILL — DIVALPROEX SOD ER 250 MG TA: 250 | 30 days supply | Qty: 90 | Fill #0

## 2017-10-27 MED FILL — LORazepam 1 MG TABS: 1 | 30 days supply | Qty: 90 | Fill #0

## 2017-10-27 MED FILL — LITHIUM CARBONATE ER 300 MG: 300 | 30 days supply | Qty: 120 | Fill #0

## 2017-10-27 MED FILL — busPIRone HCL 15 MG TABS: 15 | 30 days supply | Qty: 60 | Fill #0

## 2017-10-27 MED FILL — TADALAFIL 5 MG TABS: 5 | 30 days supply | Qty: 6 | Fill #0

## 2017-10-28 NOTE — Telephone Encounter (Signed)
Fax received authorizing patients Tadalafil. [facility] and was advised they will contact the patient when it is ready for pick up

## 2017-11-26 MED FILL — LORazepam 1 MG TABS: 1 | 30 days supply | Qty: 90 | Fill #1

## 2017-11-26 MED FILL — DIVALPROEX SOD ER 250 MG TA: 250 | 30 days supply | Qty: 90 | Fill #1

## 2017-11-26 MED FILL — TADALAFIL 5 MG TABS: 5 | 30 days supply | Qty: 6 | Fill #1

## 2017-11-26 MED FILL — busPIRone HCL 15 MG TABS: 15 | 30 days supply | Qty: 60 | Fill #1

## 2017-11-26 MED FILL — LITHIUM CARBONATE ER 300 MG: 300 | 30 days supply | Qty: 120 | Fill #1

## 2017-12-04 ENCOUNTER — Ambulatory Visit (INDEPENDENT_AMBULATORY_CARE_PROVIDER_SITE_OTHER): Payer: 59 | Admitting: Psychiatry

## 2017-12-04 ENCOUNTER — Encounter (HOSPITAL_COMMUNITY): Payer: Self-pay | Admitting: Psychiatry

## 2017-12-04 DIAGNOSIS — F411 Generalized anxiety disorder: Secondary | ICD-10-CM

## 2017-12-04 DIAGNOSIS — M549 Dorsalgia, unspecified: Secondary | ICD-10-CM | POA: Diagnosis not present

## 2017-12-04 DIAGNOSIS — F3181 Bipolar II disorder: Secondary | ICD-10-CM

## 2017-12-04 DIAGNOSIS — Z813 Family history of other psychoactive substance abuse and dependence: Secondary | ICD-10-CM

## 2017-12-04 DIAGNOSIS — F410 Panic disorder [episodic paroxysmal anxiety] without agoraphobia: Secondary | ICD-10-CM

## 2017-12-04 DIAGNOSIS — Z811 Family history of alcohol abuse and dependence: Secondary | ICD-10-CM | POA: Diagnosis not present

## 2017-12-04 DIAGNOSIS — F17210 Nicotine dependence, cigarettes, uncomplicated: Secondary | ICD-10-CM

## 2017-12-04 DIAGNOSIS — Z818 Family history of other mental and behavioral disorders: Secondary | ICD-10-CM | POA: Diagnosis not present

## 2017-12-04 MED ORDER — BUSPIRONE HCL 15 MG PO TABS: 15.0000 mg | ORAL_TABLET | Freq: Two times a day (BID) | ORAL | 2 refills | Status: DC

## 2017-12-04 MED ORDER — TADALAFIL 5 MG PO TABS: 5.0000 mg | ORAL_TABLET | Freq: Every day | ORAL | 2 refills | Status: DC | PRN

## 2017-12-04 MED ORDER — DIVALPROEX SODIUM ER 250 MG PO TB24: ORAL_TABLET | ORAL | 2 refills | Status: DC

## 2017-12-04 MED ORDER — LITHIUM CARBONATE ER 300 MG PO TBCR: 1200.0000 mg | EXTENDED_RELEASE_TABLET | Freq: Every day | ORAL | 2 refills | Status: DC

## 2017-12-04 MED ORDER — LORAZEPAM 1 MG PO TABS: 1.0000 mg | ORAL_TABLET | Freq: Three times a day (TID) | ORAL | 2 refills | Status: DC | PRN

## 2017-12-04 NOTE — Progress Notes (Signed)
BH MD/PA/NP OP Progress Note  12/04/2017 3:59 PM Matthew [name]  MRN:  [id number]  Chief Complaint:  Chief Complaint    Follow-up     HPI: Patient states he has had a pretty good 6 weeks.  He had a period of time for about 1 week where he was experiencing a lot of back pain and was feeling very irritable.  During this time his depression was worse and he was having passive suicidal thoughts with a desire to "run".  he states the mood in the house was tense and everyone in the house seem to be having a bad time.  Past and things have improved.  He states he still gets irritated by his wife and children but the desire to run has been brief and he has not acted on it.  Depression has improved.  Sleep is fair.  Works continues to be stressful but he has set some limits and given himself permission to end his day at appropriate time and only work for limited time from home.  It has helped.  He reports that Cialis has been very effective in helping his sexual relationship with his wife.  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  He still has on and off anxiety but it is more manageable.  He continues to take Ativan and the BuSpar.  His wife noticed that when he took Neurontin it made him depressed so patient has not been taking it as of late.  He denies current SI/HI. Pt denies recent manic and hypomanic symptoms including periods of decreased need for sleep, increased energy, mood lability, impulsivity, FOI, and excessive spending.   He feels this medication regime has been helpful and wants to continue and he denies side effects.  Visit Diagnosis:    ICD-10-CM   1. GAD (generalized anxiety disorder) F41.1 busPIRone (BUSPAR) 15 MG tablet    LORazepam (ATIVAN) 1 MG tablet  2. Bipolar 2 disorder (HCC) F31.81 divalproex (DEPAKOTE ER) 250 MG 24 hr tablet    lithium carbonate (LITHOBID) 300 MG CR tablet  3. Panic 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 F41.0  LORazepam (ATIVAN) 1 MG tablet    Past Psychiatric History:  Anxiety:Yes Bipolar Disorder:No Depression:Yes Mania:No Psychosis:No Schizophrenia:No Personality Disorder:No Hospitalization for psychiatric illness:No History of Electroconvulsive Shock Therapy:No Prior Suicide Attempts:No    Past Medical History:  Past Medical History:  Diagnosis Date  . Acute kidney failure (HCC) 01/2012  . Anxiety   . Bipolar disorder (HCC)    hypomania  . COPD (chronic obstructive pulmonary disease) (HCC)   . Depression   . Low back pain     Past Surgical History:  Procedure Laterality Date  . NO PAST SURGERIES      Family Psychiatric History:  Family History  Problem Relation Age of Onset  . Hypertension Other   . Anxiety disorder Mother   . Alcohol abuse Father   . Drug abuse Father   . Bipolar disorder Sister   . Alcohol abuse Brother   . Drug abuse Brother   . Bipolar disorder Paternal Aunt   . Schizophrenia Paternal Aunt   . Cancer Neg Hx   . Diabetes Neg Hx   . Heart disease Neg Hx   . Hyperlipidemia Neg Hx   . Stroke Neg Hx   . Suicidality Neg Hx     Social History:  Social History   Socioeconomic History  . Marital status: Married  Spouse name: Not on file  . Number of children: Not on file  . Years of education: Not on file  . Highest education level: Not on file  Occupational History  . Not on file  Social Needs  . Financial resource strain: Not on file  . Food insecurity:    Worry: Not on file    Inability: Not on file  . Transportation needs:    Medical: Not on file    Non-medical: Not on file  Tobacco Use  . Smoking status: Current Every Day Smoker    Packs/day: 1.00    Years: 20.00    Pack years: 20.00    Types: Cigarettes  . Smokeless tobacco: Never Used  Substance and Sexual Activity  . Alcohol use: No  . Drug use: No  . Sexual activity: Yes    Partners: Female    Birth control/protection: None  Lifestyle  . Physical  activity:    Days per week: Not on file    Minutes per session: Not on file  . Stress: Not on file  Relationships  . Social connections:    Talks on phone: Not on file    Gets together: Not on file    Attends religious service: Not on file    Active member of club or organization: Not on file    Attends meetings of clubs or organizations: Not on file    Relationship status: Not on file  Other Topics Concern  . Not on file  Social History Narrative  . Not on file    Allergies:  Allergies  Allergen Reactions  . Ciprofloxacin     Acute kidney failure    Metabolic Disorder Labs: Lab Results  Component Value Date   HGBA1C 5.1 04/24/2017   MPG 103 02/03/2014   Lab Results  Component Value Date   PROLACTIN 6.4 04/24/2017   Lab Results  Component Value Date   CHOL 141 04/24/2017   TRIG 319 (H) 04/24/2017   HDL 27 (L) 04/24/2017   CHOLHDL 5 01/21/2011   VLDL 34.8 01/21/2011   LDLCALC 50 04/24/2017   LDLCALC 77 01/21/2011   Lab Results  Component Value Date   TSH 2.020 04/24/2017   TSH 1.75 10/30/2016    Therapeutic Level Labs: Lab Results  Component Value Date   LITHIUM 0.9 04/24/2017   LITHIUM 0.7 (L) 10/30/2016   Lab Results  Component Value Date   VALPROATE 46 (L) 04/24/2017   No components found for:  CBMZ  Current Medications: Current Outpatient Medications  Medication Sig Dispense Refill  . baclofen (LIORESAL) 10 MG tablet Take 10 mg by mouth as needed for muscle spasms.    . busPIRone (BUSPAR) 15 MG tablet Take 1 tablet (15 mg total) by mouth 2 (two) times daily. 60 tablet 2  . celecoxib (CELEBREX) 100 MG capsule Take 100 mg by mouth as needed for mild pain.     . divalproex (DEPAKOTE ER) 250 MG 24 hr tablet Take one tab po qAM and 2 tabs po qPM 90 tablet 2  . gabapentin (NEURONTIN) 300 MG capsule Take 300 mg by mouth daily as needed. For pain    . HYDROcodone-acetaminophen (NORCO) 10-325 MG tablet Take 1-2 tablets by mouth every 6 (six) hours as  needed. 15 tablet 0  . lithium carbonate (LITHOBID) 300 MG CR tablet Take 4 tablets (1,200 mg total) by mouth at bedtime. 120 tablet 2  . LORazepam (ATIVAN) 1 MG tablet Take 1 tablet (1 mg total) by  mouth 3 (three) times daily as needed for anxiety. 90 tablet 2  . tadalafil (CIALIS) 5 MG tablet Take 1 tablet (5 mg total) by mouth daily as needed for erectile dysfunction. 10 tablet 1  . cyclobenzaprine (FLEXERIL) 10 MG tablet Take 1 tablet (10 mg total) by mouth 3 (three) times daily as needed. (Patient taking differently: Take 10 mg by mouth 3 (three) times daily as needed for muscle spasms. ) 30 tablet 0   No current facility-administered medications for this visit.      Musculoskeletal: Strength & Muscle Tone: within normal limits Gait & Station: normal Patient leans: N/A  Psychiatric Specialty Exam: Review of Systems  Constitutional: Negative for chills, diaphoresis and fever.  Musculoskeletal: Positive for back pain. Negative for joint pain and neck pain.    Blood pressure 132/78, pulse 91, height 6\' 5"  (1.956 m), weight 238 lb 3.2 oz (108 kg).Body mass index is 28.25 kg/m.  General Appearance: Well Groomed  Eye Contact:  Good  Speech:  Clear and Coherent and Normal Rate  Volume:  Normal  Mood:  Anxious  Affect:  Full Range  Thought Process:  Goal Directed and Descriptions of Associations: Intact  Orientation:  Full (Time, Place, and Person)  Thought Content: Logical   Suicidal Thoughts:  No  Homicidal Thoughts:  No  Memory:  Immediate;   Good Recent;   Good Remote;   Good  Judgement:  Good  Insight:  Good  Psychomotor Activity:  Normal  Concentration:  Concentration: Good and Attention Span: Good  Recall:  Good  Fund of Knowledge: Good  Language: Good  Akathisia:  No  Handed:  Right  AIMS (if indicated): not done  Assets:  Communication Skills Desire for Improvement Financial Resources/Insurance Housing Social  Support Talents/Skills Transportation Vocational/Educational  ADL's:  Intact  Cognition: WNL  Sleep:  Good   Screenings: PHQ2-9     [facility]  PHQ-2 Total Score  0      I reviewed the information below and agree except where noted/changed Assessment and Plan: Bipolar II d/o- depressed; Panic 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; GAD; Insomnia    Medication management with supportive therapy. Risks and benefits, side effects and alternative treatment options discussed with patient. Pt was given an opportunity to ask questions about medication, illness, and treatment. All current psychiatric medications have been reviewed and discussed with the patient and adjusted as clinically appropriate. The patient has been provided an accurate and updated list of the medications being now prescribed. Patient expressed understanding of how their medications were to be used.  Pt verbalized understanding and verbal consent obtained for treatment. Status of current problems: worsening  Meds: Lithobid 1200mg  po qHS for Bipolar disorder Depakote ER 250mg  po qAM and 500mg  po qHS for Bipolar disorder Buspar 15mg  po BID for GAD Ativan 1mg  po TID prn anxiety for GAD Cialis 5mg  po prn sexual activity- pt reports it is effective and he denies SE   Labs: none  Therapy: brief supportive therapy provided. Discussed psychosocial stressors in detail.   Discussed the importance of self care and came up with several possible ways to decrease his stress and responsibilities  Consultations: Encouraged to follow up with PCP as needed  Pt denies SI and is at an acute low risk for suicide. Patient told to call clinic if any problems occur. Patient advised to go to ER if they should develop SI/HI, side effects, or if symptoms worsen. Has crisis numbers to  call if needed. Pt verbalized understanding.  F/up in 8 weeks sooner if needed     [name],  MD 12/04/2017, 3:59 PM

## 2017-12-26 MED FILL — LITHIUM CARBONATE ER 300 MG: 300 | 30 days supply | Qty: 120 | Fill #2

## 2017-12-26 MED FILL — LORazepam 1 MG TABS: 1 | 30 days supply | Qty: 90 | Fill #2

## 2017-12-26 MED FILL — TADALAFIL 5 MG TABS: 5 | 30 days supply | Qty: 6 | Fill #2

## 2017-12-26 MED FILL — DIVALPROEX SOD ER 250 MG TA: 250 | 30 days supply | Qty: 90 | Fill #2

## 2017-12-26 MED FILL — busPIRone HCL 15 MG TABS: 15 | 30 days supply | Qty: 60 | Fill #2

## 2018-01-26 MED FILL — DIVALPROEX SOD ER 250 MG TA: 250 | 30 days supply | Qty: 90 | Fill #0

## 2018-01-26 MED FILL — TADALAFIL 5 MG TABS: 5 | 30 days supply | Qty: 10 | Fill #0

## 2018-01-26 MED FILL — LITHIUM CARBONATE ER 300 MG: 300 | 30 days supply | Qty: 120 | Fill #0

## 2018-01-26 MED FILL — busPIRone HCL 15 MG TABS: 15 | 30 days supply | Qty: 60 | Fill #0

## 2018-01-26 MED FILL — LORazepam 1 MG TABS: 1 | 30 days supply | Qty: 90 | Fill #0

## 2018-01-29 ENCOUNTER — Ambulatory Visit (INDEPENDENT_AMBULATORY_CARE_PROVIDER_SITE_OTHER): Payer: 59 | Admitting: Psychiatry

## 2018-01-29 ENCOUNTER — Encounter (HOSPITAL_COMMUNITY): Payer: Self-pay | Admitting: Psychiatry

## 2018-01-29 DIAGNOSIS — Z79899 Other long term (current) drug therapy: Secondary | ICD-10-CM

## 2018-01-29 DIAGNOSIS — G4700 Insomnia, unspecified: Secondary | ICD-10-CM

## 2018-01-29 DIAGNOSIS — Z811 Family history of alcohol abuse and dependence: Secondary | ICD-10-CM

## 2018-01-29 DIAGNOSIS — F17210 Nicotine dependence, cigarettes, uncomplicated: Secondary | ICD-10-CM

## 2018-01-29 DIAGNOSIS — F410 Panic disorder [episodic paroxysmal anxiety] without agoraphobia: Secondary | ICD-10-CM

## 2018-01-29 DIAGNOSIS — F411 Generalized anxiety disorder: Secondary | ICD-10-CM

## 2018-01-29 DIAGNOSIS — F3181 Bipolar II disorder: Secondary | ICD-10-CM

## 2018-01-29 DIAGNOSIS — Z813 Family history of other psychoactive substance abuse and dependence: Secondary | ICD-10-CM

## 2018-01-29 DIAGNOSIS — Z818 Family history of other mental and behavioral disorders: Secondary | ICD-10-CM

## 2018-01-29 MED ORDER — LITHIUM CARBONATE ER 300 MG PO TBCR: 1200.0000 mg | EXTENDED_RELEASE_TABLET | Freq: Every day | ORAL | 2 refills | Status: DC

## 2018-01-29 MED ORDER — BUSPIRONE HCL 15 MG PO TABS: 15.0000 mg | ORAL_TABLET | Freq: Two times a day (BID) | ORAL | 2 refills | Status: DC

## 2018-01-29 MED ORDER — VENLAFAXINE HCL ER 150 MG PO CP24: 150.0000 mg | ORAL_CAPSULE | Freq: Every day | ORAL | 2 refills | Status: DC

## 2018-01-29 MED ORDER — LORAZEPAM 1 MG PO TABS: 1.0000 mg | ORAL_TABLET | Freq: Three times a day (TID) | ORAL | 2 refills | Status: DC | PRN

## 2018-01-29 MED ORDER — DIVALPROEX SODIUM ER 250 MG PO TB24: ORAL_TABLET | ORAL | 2 refills | Status: DC

## 2018-01-29 MED FILL — VENLAFAXINE HCL ER 150 MG C: 150 | 30 days supply | Qty: 30 | Fill #0

## 2018-01-29 NOTE — Progress Notes (Addendum)
BH MD/PA/NP OP Progress Note  01/29/2018 11:30 AM Matthew [name]  MRN:  [id number]  Chief Complaint:  Chief Complaint    Anxiety; Follow-up     HPI: Pt reports that he is very depressed. It started shortly after our visit in April. He has been feeling sad, worthless and having near SI without plan or intent. He states he is irritable.  Camryn just wants it to stop.  He states that prior to getting depressed his wife told him that he seemed manic but did not specify what symptoms he was having.  Today he denies denies recent manic and hypomanic symptoms including periods of decreased need for sleep, increased energy, mood lability, impulsivity, FOI, and excessive spending.  He continues to experience a lot of anxiety with racing thoughts, GI upset, palpitations.  He states that Ativan helps some he is taking it about twice a day.  She continues to be overwhelmed by work and reports that he has limited the amount of work he does in the evenings and at home on the weekends which has helped some.  He did experience one stress induced panic attacks since her last visit.  Visit Diagnosis:    ICD-10-CM   1. GAD (generalized anxiety disorder) F41.1 busPIRone (BUSPAR) 15 MG tablet    LORazepam (ATIVAN) 1 MG tablet    venlafaxine XR (EFFEXOR XR) 150 MG 24 hr capsule  2. Bipolar 2 disorder (HCC) F31.81 divalproex (DEPAKOTE ER) 250 MG 24 hr tablet    lithium carbonate (LITHOBID) 300 MG CR tablet    venlafaxine XR (EFFEXOR XR) 150 MG 24 hr capsule  3. Panic 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 F41.0 LORazepam (ATIVAN) 1 MG tablet    venlafaxine XR (EFFEXOR XR) 150 MG 24 hr capsule    Past Psychiatric History:  Anxiety:Yes Bipolar Disorder:No Depression:Yes Mania:No Psychosis:No Schizophrenia:No Personality Disorder:No Hospitalization for psychiatric illness:No History of Electroconvulsive Shock Therapy:No Prior Suicide Attempts:No    Past Medical History:  Past Medical History:   Diagnosis Date  . Acute kidney failure (HCC) 01/2012  . Anxiety   . Bipolar disorder (HCC)    hypomania  . COPD (chronic obstructive pulmonary disease) (HCC)   . Depression   . Low back pain     Past Surgical History:  Procedure Laterality Date  . NO PAST SURGERIES      Family Psychiatric History:  Family History  Problem Relation Age of Onset  . Hypertension Other   . Anxiety disorder Mother   . Alcohol abuse Father   . Drug abuse Father   . Bipolar disorder Sister   . Alcohol abuse Brother   . Drug abuse Brother   . Bipolar disorder Paternal Aunt   . Schizophrenia Paternal Aunt   . Cancer Neg Hx   . Diabetes Neg Hx   . Heart disease Neg Hx   . Hyperlipidemia Neg Hx   . Stroke Neg Hx   . Suicidality Neg Hx     Social History:  Social History   Socioeconomic History  . Marital status: Married    Spouse name: Not on file  . Number of children: Not on file  . Years of education: Not on file  . Highest education level: Not on file  Occupational History  . Not on file  Social Needs  . Financial resource strain: Not on file  . Food insecurity:    Worry: Not on file    Inability: Not on file  . Transportation needs:    Medical:  Not on file    Non-medical: Not on file  Tobacco Use  . Smoking status: Current Every Day Smoker    Packs/day: 1.00    Years: 20.00    Pack years: 20.00    Types: Cigarettes  . Smokeless tobacco: Never Used  Substance and Sexual Activity  . Alcohol use: No  . Drug use: No  . Sexual activity: Yes    Partners: Female    Birth control/protection: None  Lifestyle  . Physical activity:    Days per week: Not on file    Minutes per session: Not on file  . Stress: Not on file  Relationships  . Social connections:    Talks on phone: Not on file    Gets together: Not on file    Attends religious service: Not on file    Active member of club or organization: Not on file    Attends meetings of clubs or organizations: Not on file     Relationship status: Not on file  Other Topics Concern  . Not on file  Social History Narrative  . Not on file    Allergies:  Allergies  Allergen Reactions  . Ciprofloxacin     Acute kidney failure    Metabolic Disorder Labs: Lab Results  Component Value Date   HGBA1C 5.1 04/24/2017   MPG 103 02/03/2014   Lab Results  Component Value Date   PROLACTIN 6.4 04/24/2017   Lab Results  Component Value Date   CHOL 141 04/24/2017   TRIG 319 (H) 04/24/2017   HDL 27 (L) 04/24/2017   CHOLHDL 5 01/21/2011   VLDL 34.8 01/21/2011   LDLCALC 50 04/24/2017   LDLCALC 77 01/21/2011   Lab Results  Component Value Date   TSH 2.020 04/24/2017   TSH 1.75 10/30/2016    Therapeutic Level Labs: Lab Results  Component Value Date   LITHIUM 0.9 04/24/2017   LITHIUM 0.7 (L) 10/30/2016   Lab Results  Component Value Date   VALPROATE 46 (L) 04/24/2017   No components found for:  CBMZ  Current Medications: Current Outpatient Medications  Medication Sig Dispense Refill  . baclofen (LIORESAL) 10 MG tablet Take 10 mg by mouth as needed for muscle spasms.    . busPIRone (BUSPAR) 15 MG tablet Take 1 tablet (15 mg total) by mouth 2 (two) times daily. 60 tablet 2  . celecoxib (CELEBREX) 100 MG capsule Take 100 mg by mouth as needed for mild pain.     . cyclobenzaprine (FLEXERIL) 10 MG tablet Take 1 tablet (10 mg total) by mouth 3 (three) times daily as needed. (Patient taking differently: Take 10 mg by mouth 3 (three) times daily as needed for muscle spasms. ) 30 tablet 0  . divalproex (DEPAKOTE ER) 250 MG 24 hr tablet Take one tab po qAM and 2 tabs po qPM 90 tablet 2  . gabapentin (NEURONTIN) 300 MG capsule Take 300 mg by mouth daily as needed. For pain    . HYDROcodone-acetaminophen (NORCO) 10-325 MG tablet Take 1-2 tablets by mouth every 6 (six) hours as needed. 15 tablet 0  . lithium carbonate (LITHOBID) 300 MG CR tablet Take 4 tablets (1,200 mg total) by mouth at bedtime. 120 tablet 2  .  LORazepam (ATIVAN) 1 MG tablet Take 1 tablet (1 mg total) by mouth 3 (three) times daily as needed for anxiety. 90 tablet 2  . tadalafil (CIALIS) 5 MG tablet Take 1 tablet (5 mg total) by mouth daily as needed for erectile  dysfunction. 10 tablet 2  . venlafaxine XR (EFFEXOR XR) 150 MG 24 hr capsule Take 1 capsule (150 mg total) by mouth daily with breakfast. 30 capsule 2   No current facility-administered medications for this visit.      Musculoskeletal: Strength & Muscle Tone: within normal limits Gait & Station: normal Patient leans: N/A  Psychiatric Specialty Exam: Review of Systems  Constitutional: Negative for chills, diaphoresis and fever.  Psychiatric/Behavioral: Positive for depression and suicidal ideas. The patient is nervous/anxious. The patient does not have insomnia.     Blood pressure 113/74, pulse 81, height 6' (1.829 m), weight 234 lb 3.2 oz (106.2 kg), SpO2 99 %.Body mass index is 31.76 kg/m.  General Appearance: Well Groomed  Eye Contact:  Good  Speech:  Clear and Coherent and Normal Rate  Volume:  Normal  Mood:  Anxious and Depressed  Affect:  Congruent  Thought Process:  Goal Directed and Descriptions of Associations: Intact  Orientation:  Full (Time, Place, and Person)  Thought Content: Logical   Suicidal Thoughts:  Yes.  without intent/plan  Homicidal Thoughts:  No  Memory:  Immediate;   Good Recent;   Good Remote;   Good  Judgement:  Good  Insight:  Good  Psychomotor Activity:  Normal  Concentration:  Concentration: Good and Attention Span: Good  Recall:  Good  Fund of Knowledge: Good  Language: Good  Akathisia:  No  Handed:  Right  AIMS (if indicated): not done  Assets:  Communication Skills Desire for Improvement Housing Social Support Transportation Vocational/Educational  ADL's:  Intact  Cognition: WNL  Sleep:  Fair   Screenings: PHQ2-9     Office Visit from 12/22/2015 in [facility]  PHQ-2 Total Score  0       I reviewed the information below on 01/29/2018 and agree except where noted/changed Assessment and Plan: Bipolar II d/o- depressed; Panic 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; GAD; Insomnia   Medication management with supportive therapy. Risks and benefits, side effects and alternative treatment options discussed with patient. Pt was given an opportunity to ask questions about medication, illness, and treatment. All current psychiatric medications have been reviewed and discussed with the patient and adjusted as clinically appropriate. The patient has been provided an accurate and updated list of the medications being now prescribed. Patient expressed understanding of how their medications were to be used. Pt verbalized understanding and verbal consent obtained for treatment. Status of current problems:worsening  Meds:Lithobid 1200mg  po qHS for Bipolar disorder Depakote ER 250mg  po qAM and 500mg  po qHS for Bipolar disorder Buspar 15mg  po BID for GAD Ativan 1mg  po TID prn anxiety for GAD Start trial of Effexor XR 150mg  po qD for depression, GAD and panic attacks Cialis 5mg  po prn sexual activity- pt reports it is effective and he denies SE   Labs:none  Therapy: brief supportive therapy provided. Discussed psychosocial stressors in detail. Discussed the importance of self care and came up with several possible ways to decrease his stress and responsibilities  Consultations: Encouraged to follow up with PCP as needed. Pt will consider therapy and call us back to schedule if he feels comfortable. I believe he would work well with Wes.   Pt reports daily SI without plan or intent for the last 2-3 weeks. He is at an acute low risk for suicide due to no prior hx of suicide attempts, social support, responsibility towards family and compliance with meds. Patient told to call clinic if any problems occur. Patient advised  to go to ER if they should develop SI/HI, side effects, or if symptoms  worsen. Has crisis numbers to call if needed. Pt verbalized understanding.  F/up in6 weekssooner if needed   Oletta DarterSalina Anavi Branscum, MD 01/29/2018, 11:30 AM

## 2018-02-12 DIAGNOSIS — M5126 Other intervertebral disc displacement, lumbar region: Secondary | ICD-10-CM | POA: Diagnosis not present

## 2018-02-12 MED FILL — GABAPENTIN 300 MG CAPSULE: 300 | 30 days supply | Qty: 60 | Fill #0

## 2018-02-12 MED FILL — BACLOFEN 10 MG TABLET: 10 | 30 days supply | Qty: 90 | Fill #0

## 2018-02-12 MED FILL — CYCLOBENZAPRINE 10 MG TAB: 10 | 30 days supply | Qty: 60 | Fill #0

## 2018-02-25 MED FILL — VENLAFAXINE HCL ER 150 MG C: 150 | 30 days supply | Qty: 30 | Fill #1

## 2018-02-25 MED FILL — LITHIUM CARBONATE ER 300 MG: 300 | 30 days supply | Qty: 120 | Fill #1

## 2018-02-25 MED FILL — TADALAFIL 5 MG TABS: 5 | 50 days supply | Qty: 10 | Fill #1

## 2018-02-25 MED FILL — DIVALPROEX SOD ER 250 MG TA: 250 | 30 days supply | Qty: 90 | Fill #1

## 2018-02-25 MED FILL — busPIRone HCL 15 MG TABS: 15 | 30 days supply | Qty: 60 | Fill #1

## 2018-02-25 MED FILL — LORazepam 1 MG TABS: 1 | 30 days supply | Qty: 90 | Fill #1

## 2018-03-12 ENCOUNTER — Ambulatory Visit (INDEPENDENT_AMBULATORY_CARE_PROVIDER_SITE_OTHER): Payer: 59 | Admitting: Psychiatry

## 2018-03-12 VITALS — BP 117/75 | HR 89 | Ht 77.0 in | Wt 228.2 lb

## 2018-03-12 DIAGNOSIS — F410 Panic disorder [episodic paroxysmal anxiety] without agoraphobia: Secondary | ICD-10-CM

## 2018-03-12 DIAGNOSIS — F3181 Bipolar II disorder: Secondary | ICD-10-CM | POA: Diagnosis not present

## 2018-03-12 DIAGNOSIS — Z79899 Other long term (current) drug therapy: Secondary | ICD-10-CM | POA: Diagnosis not present

## 2018-03-12 DIAGNOSIS — F411 Generalized anxiety disorder: Secondary | ICD-10-CM | POA: Diagnosis not present

## 2018-03-12 MED ORDER — DIVALPROEX SODIUM ER 250 MG PO TB24: ORAL_TABLET | ORAL | 2 refills | Status: DC

## 2018-03-12 MED ORDER — LITHIUM CARBONATE ER 300 MG PO TBCR: 1200.0000 mg | EXTENDED_RELEASE_TABLET | Freq: Every day | ORAL | 2 refills | Status: DC

## 2018-03-12 MED ORDER — VENLAFAXINE HCL ER 150 MG PO CP24: 150.0000 mg | ORAL_CAPSULE | Freq: Every day | ORAL | 2 refills | Status: DC

## 2018-03-12 MED ORDER — BUSPIRONE HCL 15 MG PO TABS: 15.0000 mg | ORAL_TABLET | Freq: Two times a day (BID) | ORAL | 2 refills | Status: DC

## 2018-03-12 MED ORDER — LORAZEPAM 1 MG PO TABS: 1.0000 mg | ORAL_TABLET | Freq: Three times a day (TID) | ORAL | 2 refills | Status: DC | PRN

## 2018-03-12 NOTE — Progress Notes (Signed)
BH MD/PA/NP OP Progress Note  03/12/2018 3:45 PM EATHEN BUDREAU III  MRN:  [id number]  Chief Complaint:  Chief Complaint    Anxiety; Depression; Follow-up     HPI: Today the patient reports that he feels the Effexor has been very effective.  The first week he took it he started to feel calm and over the next 5 weeks he states his depression improved significantly.  He is not irritable, he is more engaged and has more motivation to do various things now.  His wife has commented that he seems happier.  He denies any recent manic or hypomanic-like symptoms.  He denies SI/HI.  He no longer has the thoughts or desire to run away from all of his current stressors.  He states he has been more productive at work.  Umar is attempting to maintain boundaries so that he can refuse some projects and limit the amount of time he is working at home.  He does have ongoing anxiety and takes Ativan about twice a day.  It does help to calm him down.  Visit Diagnosis:    ICD-10-CM   1. Encounter for long-term (current) use of medications Z79.899 TSH    Hemoglobin A1c    Comprehensive metabolic panel    Lipid Panel With LDL/HDL Ratio    Valproic acid level    Lithium level    Prolactin    CBC with Differential/Platelet    CBC with Differential/Platelet    Prolactin    Lithium level    Valproic acid level    Lipid Panel With LDL/HDL Ratio    Comprehensive metabolic panel    Hemoglobin A1c    TSH    CANCELED: CBC with Differential  2. GAD (generalized anxiety disorder) F41.1 busPIRone (BUSPAR) 15 MG tablet    LORazepam (ATIVAN) 1 MG tablet    venlafaxine XR (EFFEXOR XR) 150 MG 24 hr capsule  3. Bipolar 2 disorder (HCC) F31.81 divalproex (DEPAKOTE ER) 250 MG 24 hr tablet    lithium carbonate (LITHOBID) 300 MG CR tablet    venlafaxine XR (EFFEXOR XR) 150 MG 24 hr capsule  4. Panic 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 F41.0 LORazepam (ATIVAN) 1 MG tablet    venlafaxine XR (EFFEXOR XR) 150 MG 24 hr capsule     Past Psychiatric History:  Anxiety:Yes Bipolar Disorder:No Depression:Yes Mania:No Psychosis:No Schizophrenia:No Personality Disorder:No Hospitalization for psychiatric illness:No History of Electroconvulsive Shock Therapy:No Prior Suicide Attempts:No    Past Medical History:  Past Medical History:  Diagnosis Date  . Acute kidney failure (HCC) 01/2012  . Anxiety   . Bipolar disorder (HCC)    hypomania  . COPD (chronic obstructive pulmonary disease) (HCC)   . Depression   . Low back pain     Past Surgical History:  Procedure Laterality Date  . NO PAST SURGERIES      Family Psychiatric History:  Family History  Problem Relation Age of Onset  . Hypertension Other   . Anxiety disorder Mother   . Alcohol abuse Father   . Drug abuse Father   . Bipolar disorder Sister   . Alcohol abuse Brother   . Drug abuse Brother   . Bipolar disorder Paternal Aunt   . Schizophrenia Paternal Aunt   . Cancer Neg Hx   . Diabetes Neg Hx   . Heart disease Neg Hx   . Hyperlipidemia Neg Hx   . Stroke Neg Hx   . Suicidality Neg Hx     Social History:  Social History  Socioeconomic History  . Marital status: Married    Spouse name: Not on file  . Number of children: Not on file  . Years of education: Not on file  . Highest education level: Not on file  Occupational History  . Not on file  Social Needs  . Financial resource strain: Not on file  . Food insecurity:    Worry: Not on file    Inability: Not on file  . Transportation needs:    Medical: Not on file    Non-medical: Not on file  Tobacco Use  . Smoking status: Current Every Day Smoker    Packs/day: 1.00    Years: 20.00    Pack years: 20.00    Types: Cigarettes  . Smokeless tobacco: Never Used  Substance and Sexual Activity  . Alcohol use: No  . Drug use: No  . Sexual activity: Yes    Partners: Female    Birth control/protection: None  Lifestyle  . Physical activity:    Days per week: Not on  file    Minutes per session: Not on file  . Stress: Not on file  Relationships  . Social connections:    Talks on phone: Not on file    Gets together: Not on file    Attends religious service: Not on file    Active member of club or organization: Not on file    Attends meetings of clubs or organizations: Not on file    Relationship status: Not on file  Other Topics Concern  . Not on file  Social History Narrative  . Not on file    Allergies:  Allergies  Allergen Reactions  . Ciprofloxacin     Acute kidney failure    Metabolic Disorder Labs: Lab Results  Component Value Date   HGBA1C 5.1 04/24/2017   MPG 103 02/03/2014   Lab Results  Component Value Date   PROLACTIN 6.4 04/24/2017   Lab Results  Component Value Date   CHOL 141 04/24/2017   TRIG 319 (H) 04/24/2017   HDL 27 (L) 04/24/2017   CHOLHDL 5 01/21/2011   VLDL 34.8 01/21/2011   LDLCALC 50 04/24/2017   LDLCALC 77 01/21/2011   Lab Results  Component Value Date   TSH 2.020 04/24/2017   TSH 1.75 10/30/2016    Therapeutic Level Labs: Lab Results  Component Value Date   LITHIUM 0.9 04/24/2017   LITHIUM 0.7 (L) 10/30/2016   Lab Results  Component Value Date   VALPROATE 46 (L) 04/24/2017   No components found for:  CBMZ  Current Medications: Current Outpatient Medications  Medication Sig Dispense Refill  . baclofen (LIORESAL) 10 MG tablet Take 10 mg by mouth as needed for muscle spasms.    . busPIRone (BUSPAR) 15 MG tablet Take 1 tablet (15 mg total) by mouth 2 (two) times daily. 60 tablet 2  . celecoxib (CELEBREX) 100 MG capsule Take 100 mg by mouth as needed for mild pain.     . cyclobenzaprine (FLEXERIL) 10 MG tablet Take 1 tablet (10 mg total) by mouth 3 (three) times daily as needed. (Patient taking differently: Take 10 mg by mouth 3 (three) times daily as needed for muscle spasms. ) 30 tablet 0  . divalproex (DEPAKOTE ER) 250 MG 24 hr tablet Take one tab po qAM and 2 tabs po qPM 90 tablet 2  .  gabapentin (NEURONTIN) 300 MG capsule Take 300 mg by mouth daily as needed. For pain    . HYDROcodone-acetaminophen (NORCO) 10-325  MG tablet Take 1-2 tablets by mouth every 6 (six) hours as needed. 15 tablet 0  . lithium carbonate (LITHOBID) 300 MG CR tablet Take 4 tablets (1,200 mg total) by mouth at bedtime. 120 tablet 2  . LORazepam (ATIVAN) 1 MG tablet Take 1 tablet (1 mg total) by mouth 3 (three) times daily as needed for anxiety. 90 tablet 2  . tadalafil (CIALIS) 5 MG tablet Take 1 tablet (5 mg total) by mouth daily as needed for erectile dysfunction. 10 tablet 2  . venlafaxine XR (EFFEXOR XR) 150 MG 24 hr capsule Take 1 capsule (150 mg total) by mouth daily with breakfast. 30 capsule 2   No current facility-administered medications for this visit.      Musculoskeletal: Strength & Muscle Tone: normal Gait & Station: normal Patient leans: straight  Psychiatric Specialty Exam: Physical Exam  Review of Systems  Gastrointestinal: Positive for diarrhea. Negative for abdominal pain, nausea and vomiting.  Psychiatric/Behavioral: Negative for depression and suicidal ideas. The patient is nervous/anxious. The patient does not have insomnia.     Blood pressure 117/75, pulse 89, height 6\' 5"  (1.956 m), weight 228 lb 3.2 oz (103.5 kg).Body mass index is 27.06 kg/m.  General Appearance: Fairly Groomed  Eye Contact:  Good  Speech:  Clear and Coherent and Normal Rate  Volume:  Normal  Mood:  Euthymic  Affect:  Full Range  Thought Process:  Goal Directed and Descriptions of Associations: Intact  Orientation:  Full (Time, Place, and Person)  Thought Content:  Logical  Suicidal Thoughts:  No  Homicidal Thoughts:  No  Memory:  Immediate;   Good Recent;   Good Remote;   Good  Judgement:  Good  Insight:  Good  Psychomotor Activity:  Normal  Concentration:  Concentration: Good and Attention Span: Good  Recall:  Good  Fund of Knowledge:  Good  Language:  Good  Akathisia:  No  Handed:   Right  AIMS (if indicated):   not done  Assets:  Communication Skills Desire for Improvement Financial Resources/Insurance Housing Intimacy Resilience Social Support Talents/Skills Transportation Vocational/Educational  ADL's:  Intact  Cognition:  WNL  Sleep:   good      Screenings: PHQ2-9     Office Visit from 12/22/2015 in [facility]  PHQ-2 Total Score  0        I reviewed the information below on 03/12/2018 and agree Assessment and Plan: Bipolar II d/o- depressed; Panic 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; GAD; Insomnia   Medication management with supportive therapy. Risks and benefits, side effects and alternative treatment options discussed with patient. Pt was given an opportunity to ask questions about medication, illness, and treatment. All current psychiatric medications have been reviewed and discussed with the patient and adjusted as clinically appropriate. The patient has been provided an accurate and updated list of the medications being now prescribed. Patient expressed understanding of how their medications were to be used. Pt verbalized understanding and verbal consent obtained for treatment.  Status of current problems:improving  Meds:Lithobid 1200mg  po qHS for Bipolar disorder Depakote ER 250mg  po qAM and 500mg  po qHS for Bipolar disorder Buspar 15mg  po BID for GAD Ativan 1mg  po TID prn anxiety for GAD Effexor XR 150mg  po qD for depression, GAD and panic attacks Cialis 5mg  po prn sexual activity- pt reports it is effective and he denies SE   Labs: ordered Depakote level, Lithium level,  CBC, CMP, HbA1c, Lipid panel, TSH, Prolactin level Pt is reporting diarrhea for last  6 weeks- recommended fiber and probiotics   Therapy: brief supportive therapy provided. Discussed psychosocial stressors in detail. Discussed the importance of self care and came up with several possible ways to decrease his stress and  responsibilities  Consultations: Encouraged to follow up with PCP as needed. Pt declined therapy  Pt reports he is no longer experiencing SI. He is at an acute low risk for suicide. He has no prior hx of suicide attempts. He has + social support, responsibility towards family and compliance with meds. Patient told to call clinic if any problems occur. Patient advised to go to ER if they should develop SI/HI, side effects, or if symptoms worsen. Has crisis numbers to call if needed. Pt verbalized understanding.  F/up in8 weekssooner if needed   Oletta DarterSalina Jeweline Reif, MD 03/12/2018, 3:45 PM

## 2018-03-13 LAB — CBC WITH DIFFERENTIAL/PLATELET
Basophils Absolute: 0 10*3/uL (ref 0.0–0.2)
Basos: 0 %
EOS (ABSOLUTE): 0.4 10*3/uL (ref 0.0–0.4)
EOS: 5 %
HEMATOCRIT: 43.9 % (ref 37.5–51.0)
Hemoglobin: 15 g/dL (ref 13.0–17.7)
IMMATURE GRANS (ABS): 0 10*3/uL (ref 0.0–0.1)
IMMATURE GRANULOCYTES: 0 %
LYMPHS: 30 %
Lymphocytes Absolute: 2.9 10*3/uL (ref 0.7–3.1)
MCH: 31.1 pg (ref 26.6–33.0)
MCHC: 34.2 g/dL (ref 31.5–35.7)
MCV: 91 fL (ref 79–97)
MONOS ABS: 0.7 10*3/uL (ref 0.1–0.9)
Monocytes: 7 %
NEUTROS ABS: 5.6 10*3/uL (ref 1.4–7.0)
NEUTROS PCT: 58 %
PLATELETS: 235 10*3/uL (ref 150–450)
RBC: 4.82 x10E6/uL (ref 4.14–5.80)
RDW: 13.8 % (ref 12.3–15.4)
WBC: 9.6 10*3/uL (ref 3.4–10.8)

## 2018-03-13 LAB — LIPID PANEL WITH LDL/HDL RATIO
CHOLESTEROL TOTAL: 155 mg/dL (ref 100–199)
HDL: 26 mg/dL — AB (ref >39)
LDL CALC: 57 mg/dL (ref 0–99)
LDl/HDL Ratio: 2.2 ratio (ref 0.0–3.6)
Triglycerides: 360 mg/dL — ABNORMAL HIGH (ref 0–149)
VLDL CHOLESTEROL CAL: 72 mg/dL — AB (ref 5–40)

## 2018-03-13 LAB — COMPREHENSIVE METABOLIC PANEL
ALBUMIN: 4.6 g/dL (ref 3.5–5.5)
ALK PHOS: 71 IU/L (ref 39–117)
ALT: 23 IU/L (ref 0–44)
AST: 17 IU/L (ref 0–40)
Albumin/Globulin Ratio: 2 (ref 1.2–2.2)
BUN / CREAT RATIO: 12 (ref 9–20)
BUN: 10 mg/dL (ref 6–24)
Bilirubin Total: <0.2 mg/dL (ref 0.0–1.2)
CO2: 18 mmol/L — AB (ref 20–29)
CREATININE: 0.83 mg/dL (ref 0.76–1.27)
Calcium: 9.1 mg/dL (ref 8.7–10.2)
Chloride: 103 mmol/L (ref 96–106)
GFR calc Af Amer: 121 mL/min/{1.73_m2} (ref >59)
GFR calc non Af Amer: 105 mL/min/{1.73_m2} (ref >59)
GLUCOSE: 143 mg/dL — AB (ref 65–99)
Globulin, Total: 2.3 g/dL (ref 1.5–4.5)
Potassium: 4.7 mmol/L (ref 3.5–5.2)
Sodium: 138 mmol/L (ref 134–144)
Total Protein: 6.9 g/dL (ref 6.0–8.5)

## 2018-03-13 LAB — HEMOGLOBIN A1C
ESTIMATED AVERAGE GLUCOSE: 103 mg/dL
HEMOGLOBIN A1C: 5.2 % (ref 4.8–5.6)

## 2018-03-13 LAB — PROLACTIN: Prolactin: 15.7 ng/mL — ABNORMAL HIGH (ref 4.0–15.2)

## 2018-03-13 LAB — VALPROIC ACID LEVEL: VALPROIC ACID LVL: 34 ug/mL — AB (ref 50–100)

## 2018-03-13 LAB — LITHIUM LEVEL: Lithium Lvl: 0.6 mmol/L (ref 0.6–1.2)

## 2018-03-13 LAB — TSH: TSH: 1.85 u[IU]/mL (ref 0.450–4.500)

## 2018-03-27 MED FILL — busPIRone HCL 15 MG TABS: 15 | 30 days supply | Qty: 60 | Fill #2

## 2018-03-27 MED FILL — DIVALPROEX SOD ER 250 MG TA: 250 | 30 days supply | Qty: 90 | Fill #2

## 2018-03-27 MED FILL — LITHIUM CARBONATE ER 300 MG: 300 | 30 days supply | Qty: 120 | Fill #2

## 2018-03-27 MED FILL — LORazepam 1 MG TABS: 1 | 30 days supply | Qty: 90 | Fill #2

## 2018-03-27 MED FILL — VENLAFAXINE HCL ER 150 MG C: 150 | 30 days supply | Qty: 30 | Fill #2

## 2018-04-16 MED FILL — TADALAFIL 5 MG TABS: 5 | 50 days supply | Qty: 10 | Fill #2

## 2018-04-24 MED FILL — LITHIUM CARBONATE ER 300 MG: 300 | 30 days supply | Qty: 120 | Fill #0

## 2018-04-24 MED FILL — VENLAFAXINE HCL ER 150 MG C: 150 | 30 days supply | Qty: 30 | Fill #0

## 2018-04-24 MED FILL — LORazepam 1 MG TABS: 1 | 30 days supply | Qty: 90 | Fill #0

## 2018-04-24 MED FILL — busPIRone HCL 15 MG TABS: 15 | 30 days supply | Qty: 60 | Fill #0

## 2018-04-24 MED FILL — DIVALPROEX SOD ER 250 MG TA: 250 | 30 days supply | Qty: 90 | Fill #0

## 2018-05-14 ENCOUNTER — Encounter (HOSPITAL_COMMUNITY): Payer: Self-pay | Admitting: Psychiatry

## 2018-05-14 ENCOUNTER — Ambulatory Visit (INDEPENDENT_AMBULATORY_CARE_PROVIDER_SITE_OTHER): Payer: 59 | Admitting: Psychiatry

## 2018-05-14 DIAGNOSIS — F411 Generalized anxiety disorder: Secondary | ICD-10-CM | POA: Diagnosis not present

## 2018-05-14 DIAGNOSIS — F3181 Bipolar II disorder: Secondary | ICD-10-CM

## 2018-05-14 DIAGNOSIS — F17210 Nicotine dependence, cigarettes, uncomplicated: Secondary | ICD-10-CM

## 2018-05-14 DIAGNOSIS — G4700 Insomnia, unspecified: Secondary | ICD-10-CM | POA: Diagnosis not present

## 2018-05-14 DIAGNOSIS — F410 Panic disorder [episodic paroxysmal anxiety] without agoraphobia: Secondary | ICD-10-CM | POA: Diagnosis not present

## 2018-05-14 MED ORDER — LITHIUM CARBONATE ER 300 MG PO TBCR: 1200.0000 mg | EXTENDED_RELEASE_TABLET | Freq: Every day | ORAL | 2 refills | Status: DC

## 2018-05-14 MED ORDER — BUSPIRONE HCL 15 MG PO TABS: 15.0000 mg | ORAL_TABLET | Freq: Two times a day (BID) | ORAL | 2 refills | Status: DC

## 2018-05-14 MED ORDER — LORAZEPAM 1 MG PO TABS: 1.0000 mg | ORAL_TABLET | Freq: Three times a day (TID) | ORAL | 2 refills | Status: DC | PRN

## 2018-05-14 MED ORDER — VENLAFAXINE HCL ER 75 MG PO CP24: 225.0000 mg | ORAL_CAPSULE | Freq: Every day | ORAL | 2 refills | Status: DC

## 2018-05-14 MED ORDER — DIVALPROEX SODIUM ER 250 MG PO TB24: ORAL_TABLET | ORAL | 2 refills | Status: DC

## 2018-05-14 MED FILL — VENLAFAXINE HCL ER 75 MG CA: 75 | 30 days supply | Qty: 90 | Fill #0

## 2018-05-14 NOTE — Progress Notes (Signed)
BH MD/PA/NP OP Progress Note  05/14/2018 4:44 PM Matthew [name]  MRN:  [id number]  Chief Complaint:  Chief Complaint    Depression     HPI: Patient states for the last 2 weeks he has been extremely depressed and irritable.  He also experienced about a week of depression 1 month ago.  He states that he feels fatigued and unmotivated.  He is more irritable and argumentative.  He spent a significant amount of time describing issues in his marital life.  He feels that his wife is not supportive or understanding.  He would like her to listen more.  They have been arguing and Matthew [name] has been feeling like he wants to run away somewhere.  He denies SI/HI.  Sleep is okay.  He reports that he has been somewhat paranoid that his wife is cheating on him or no longer wants to be with him.  Work continues to be a stressor but he is maintaining boundaries.  He is having anxiety but Ativan seems to help calm him down.  He denies any manic or hypomanic-like symptoms since her last visit.     Visit Diagnosis:    ICD-10-CM   1. GAD (generalized anxiety disorder) F41.1 venlafaxine XR (EFFEXOR-XR) 75 MG 24 hr capsule    busPIRone (BUSPAR) 15 MG tablet    LORazepam (ATIVAN) 1 MG tablet  2. Bipolar 2 disorder (HCC) F31.81 venlafaxine XR (EFFEXOR-XR) 75 MG 24 hr capsule    divalproex (DEPAKOTE ER) 250 MG 24 hr tablet    lithium carbonate (LITHOBID) 300 MG CR tablet  3. Panic 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 F41.0 venlafaxine XR (EFFEXOR-XR) 75 MG 24 hr capsule    LORazepam (ATIVAN) 1 MG tablet    Past Psychiatric History:  Anxiety:Yes Bipolar Disorder:No Depression:Yes Mania:No Psychosis:No Schizophrenia:No Personality Disorder:No Hospitalization for psychiatric illness:No History of Electroconvulsive Shock Therapy:No Prior Suicide Attempts:No    Past Medical History:  Past Medical History:  Diagnosis Date  . Acute kidney failure (HCC) 01/2012  . Anxiety   . Bipolar disorder (HCC)     hypomania  . COPD (chronic obstructive pulmonary disease) (HCC)   . Depression   . Low back pain     Past Surgical History:  Procedure Laterality Date  . NO PAST SURGERIES      Family Psychiatric History:  Family History  Problem Relation Age of Onset  . Hypertension Other   . Anxiety disorder Mother   . Alcohol abuse Father   . Drug abuse Father   . Bipolar disorder Sister   . Alcohol abuse Brother   . Drug abuse Brother   . Bipolar disorder Paternal Aunt   . Schizophrenia Paternal Aunt   . Cancer Neg Hx   . Diabetes Neg Hx   . Heart disease Neg Hx   . Hyperlipidemia Neg Hx   . Stroke Neg Hx   . Suicidality Neg Hx     Social History:  Social History   Socioeconomic History  . Marital status: Married    Spouse name: Not on file  . Number of children: Not on file  . Years of education: Not on file  . Highest education level: Not on file  Occupational History  . Not on file  Social Needs  . Financial resource strain: Not on file  . Food insecurity:    Worry: Not on file    Inability: Not on file  . Transportation needs:    Medical: Not on file    Non-medical: Not  on file  Tobacco Use  . Smoking status: Current Every Day Smoker    Packs/day: 1.00    Years: 20.00    Pack years: 20.00    Types: Cigarettes  . Smokeless tobacco: Never Used  Substance and Sexual Activity  . Alcohol use: No  . Drug use: No  . Sexual activity: Yes    Partners: Female    Birth control/protection: None  Lifestyle  . Physical activity:    Days per week: Not on file    Minutes per session: Not on file  . Stress: Not on file  Relationships  . Social connections:    Talks on phone: Not on file    Gets together: Not on file    Attends religious service: Not on file    Active member of club or organization: Not on file    Attends meetings of clubs or organizations: Not on file    Relationship status: Not on file  Other Topics Concern  . Not on file  Social History  Narrative  . Not on file    Allergies:  Allergies  Allergen Reactions  . Ciprofloxacin     Acute kidney failure    Metabolic Disorder Labs: Lab Results  Component Value Date   HGBA1C 5.2 03/12/2018   MPG 103 02/03/2014   Lab Results  Component Value Date   PROLACTIN 15.7 (H) 03/12/2018   PROLACTIN 6.4 04/24/2017   Lab Results  Component Value Date   CHOL 155 03/12/2018   TRIG 360 (H) 03/12/2018   HDL 26 (L) 03/12/2018   CHOLHDL 5 01/21/2011   VLDL 34.8 01/21/2011   LDLCALC 57 03/12/2018   LDLCALC 50 04/24/2017   Lab Results  Component Value Date   TSH 1.850 03/12/2018   TSH 2.020 04/24/2017    Therapeutic Level Labs: Lab Results  Component Value Date   LITHIUM 0.6 03/12/2018   LITHIUM 0.9 04/24/2017   Lab Results  Component Value Date   VALPROATE 34 (L) 03/12/2018   VALPROATE 46 (L) 04/24/2017   No components found for:  CBMZ  Current Medications: Current Outpatient Medications  Medication Sig Dispense Refill  . baclofen (LIORESAL) 10 MG tablet Take 10 mg by mouth as needed for muscle spasms.    . busPIRone (BUSPAR) 15 MG tablet Take 1 tablet (15 mg total) by mouth 2 (two) times daily. 60 tablet 2  . celecoxib (CELEBREX) 100 MG capsule Take 100 mg by mouth as needed for mild pain.     . cyclobenzaprine (FLEXERIL) 10 MG tablet Take 1 tablet (10 mg total) by mouth 3 (three) times daily as needed. (Patient taking differently: Take 10 mg by mouth 3 (three) times daily as needed for muscle spasms. ) 30 tablet 0  . divalproex (DEPAKOTE ER) 250 MG 24 hr tablet Take one tab po qAM and 2 tabs po qPM 90 tablet 2  . gabapentin (NEURONTIN) 300 MG capsule Take 300 mg by mouth daily as needed. For pain    . lithium carbonate (LITHOBID) 300 MG CR tablet Take 4 tablets (1,200 mg total) by mouth at bedtime. 120 tablet 2  . LORazepam (ATIVAN) 1 MG tablet Take 1 tablet (1 mg total) by mouth 3 (three) times daily as needed for anxiety. 90 tablet 2  . tadalafil (CIALIS) 5 MG  tablet Take 1 tablet (5 mg total) by mouth daily as needed for erectile dysfunction. 10 tablet 2  . venlafaxine XR (EFFEXOR-XR) 75 MG 24 hr capsule Take 3 capsules (225  mg total) by mouth daily with breakfast. 90 capsule 2  . HYDROcodone-acetaminophen (NORCO) 10-325 MG tablet Take 1-2 tablets by mouth every 6 (six) hours as needed. (Patient not taking: Reported on 05/14/2018) 15 tablet 0   No current facility-administered medications for this visit.      Musculoskeletal: Strength & Muscle Tone: normal Gait & Station: normal Patient leans: straight  Psychiatric Specialty Exam: Review of Systems  Constitutional: Negative for chills, diaphoresis and fever.  Psychiatric/Behavioral: Positive for depression. Negative for hallucinations and suicidal ideas. The patient is nervous/anxious. The patient does not have insomnia.     Blood pressure 118/77, pulse (!) 105, resp. rate 14, weight 230 lb 3.2 oz (104.4 kg).Body mass index is 27.3 kg/m.  General Appearance: Well Groomed  Eye Contact:  Good  Speech:  Clear and Coherent and Normal Rate  Volume:  Normal  Mood:  Depressed and Irritable  Affect:  Congruent  Thought Process:  Coherent and Descriptions of Associations: Intact  Orientation:  Full (Time, Place, and Person)  Thought Content:  Rumination  Suicidal Thoughts:  No  Homicidal Thoughts:  No  Memory:  Immediate;   Good  Judgement:  Fair  Insight:  Good  Psychomotor Activity:  Normal  Concentration:  Concentration: Good  Recall:  Good  Fund of Knowledge:  Good  Language:  Good  Akathisia:  No  Handed:  Right  AIMS (if indicated):     Assets:  Communication Skills Desire for Improvement Financial Resources/Insurance Housing Intimacy Social Support Talents/Skills Transportation Vocational/Educational  ADL's:  Intact  Cognition:  WNL  Sleep:   fair        Screenings: PHQ2-9     Office Visit from 12/22/2015 in [facility]  PHQ-2 Total Score  0       I reviewed the information below on 05/14/2018 and have updated it Assessment and Plan: Bipolar II d/o- depressed; Panic 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; GAD; Insomnia   Medication management with supportive therapy. Risks and benefits, side effects and alternative treatment options discussed with patient. Pt was given an opportunity to ask questions about medication, illness, and treatment. All current psychiatric medications have been reviewed and discussed with the patient and adjusted as clinically appropriate. The patient has been provided an accurate and updated list of the medications being now prescribed. Patient expressed understanding of how their medications were to be used. Pt verbalized understanding and verbal consent obtained for treatment.  Status of current problems: Worsening depression  Meds:Lithobid 1200mg  po qHS for Bipolar disorder Depakote ER 250mg  po qAM and 500mg  po qHS for Bipolar disorder Buspar 15mg  po BID for GAD Ativan 1mg  po TID prn anxiety for GAD Increase Effexor XR 225mg  po qD for depression, GAD and panic attacks Cialis 5mg  po prn sexual activity- pt reports it is effective and he denies SE   Labs: reviewed labs Depakote level 34, Lithium level 0.6,  CBC wnl, CMP shows glucose of 143 otherwise Wnl, HbA1c, 5.2 Lipid panel-triglycerides elevated, TSH wnl, Prolactin level 15.7    Therapy: brief supportive therapy provided. Discussed psychosocial stressors in detail. Discussed the importance of self care and came up with several possible ways to decrease his stress and responsibilities. We discussed effective communication and using "I" talk  Consultations: Encouraged to follow up with PCP as needed. Pt declined therapy  Pt reports he is no longer experiencing SI. He is at an acute low risk for suicide. He has no prior hx of suicide attempts. He has +  social support, responsibility towards family and compliance with meds. Patient told to call  clinic if any problems occur. Patient advised to go to ER if they should develop SI/HI, side effects, or if symptoms worsen. Has crisis numbers to call if needed. Pt verbalized understanding.  F/up in4 weekssooner if needed   [name], MD 05/14/2018, 4:44 PM

## 2018-05-26 ENCOUNTER — Other Ambulatory Visit (HOSPITAL_COMMUNITY): Payer: Self-pay | Admitting: Psychiatry

## 2018-05-26 MED FILL — DIVALPROEX SOD ER 250 MG TA: 250 | 30 days supply | Qty: 90 | Fill #1

## 2018-05-26 MED FILL — LORazepam 1 MG TABS: 1 | 30 days supply | Qty: 90 | Fill #1

## 2018-05-26 MED FILL — busPIRone HCL 15 MG TABS: 15 | 30 days supply | Qty: 60 | Fill #1

## 2018-05-26 MED FILL — LITHIUM CARBONATE ER 300 MG: 300 | 30 days supply | Qty: 120 | Fill #1

## 2018-06-04 MED FILL — TADALAFIL 5 MG TABS: 5 | 50 days supply | Qty: 10 | Fill #0

## 2018-06-18 ENCOUNTER — Ambulatory Visit (INDEPENDENT_AMBULATORY_CARE_PROVIDER_SITE_OTHER): Payer: 59 | Admitting: Psychiatry

## 2018-06-18 ENCOUNTER — Encounter (HOSPITAL_COMMUNITY): Payer: Self-pay | Admitting: Psychiatry

## 2018-06-18 DIAGNOSIS — F3181 Bipolar II disorder: Secondary | ICD-10-CM | POA: Diagnosis not present

## 2018-06-18 DIAGNOSIS — F410 Panic disorder [episodic paroxysmal anxiety] without agoraphobia: Secondary | ICD-10-CM

## 2018-06-18 DIAGNOSIS — G4700 Insomnia, unspecified: Secondary | ICD-10-CM | POA: Diagnosis not present

## 2018-06-18 DIAGNOSIS — F411 Generalized anxiety disorder: Secondary | ICD-10-CM | POA: Diagnosis not present

## 2018-06-18 MED ORDER — LORAZEPAM 1 MG PO TABS: 1.0000 mg | ORAL_TABLET | Freq: Three times a day (TID) | ORAL | 2 refills | Status: DC | PRN

## 2018-06-18 MED ORDER — VENLAFAXINE HCL ER 75 MG PO CP24: 225.0000 mg | ORAL_CAPSULE | Freq: Every day | ORAL | 2 refills | Status: DC

## 2018-06-18 MED ORDER — DIVALPROEX SODIUM ER 250 MG PO TB24: ORAL_TABLET | ORAL | 2 refills | Status: DC

## 2018-06-18 MED ORDER — LITHIUM CARBONATE ER 300 MG PO TBCR: 1200.0000 mg | EXTENDED_RELEASE_TABLET | Freq: Every day | ORAL | 2 refills | Status: DC

## 2018-06-18 MED ORDER — BUSPIRONE HCL 15 MG PO TABS: 15.0000 mg | ORAL_TABLET | Freq: Two times a day (BID) | ORAL | 2 refills | Status: DC

## 2018-06-18 MED FILL — VENLAFAXINE HCL ER 75 MG CA: 75 | 30 days supply | Qty: 90 | Fill #0

## 2018-06-18 NOTE — Progress Notes (Signed)
BH MD/PA/NP OP Progress Note  06/18/2018 3:33 PM ORVILE CORONA III  MRN:  [id number]  Chief Complaint:  Chief Complaint    Follow-up     HPI: [name] tells me that the increase in Effexor has helped to decrease his depression symptoms.  He is not as irritable or angry as before.  His frustration tolerance has improved.  He is not as argumentative with his family members and is not feeling as sensitive to day-to-day things that are happening at home.  His fatigue has improved and he is more motivated.  He denies any issues with sleep.  Zella Ball no longer feels the desire to "run away".  He denies any suicidal and homicidal ideations.  He is denying any manic or hypomanic-like symptoms.  His anxiety is more tolerable and he continues to take the Ativan.  He does feel as though he is being more productive at work.  Initially the increase in Effexor was causing fatigue but he switched the medication to nighttime and is doing well.  At this time he states he feels close to what he was like when he was started on the Depakote.    Visit Diagnosis:    ICD-10-CM   1. GAD (generalized anxiety disorder) F41.1 busPIRone (BUSPAR) 15 MG tablet    LORazepam (ATIVAN) 1 MG tablet    venlafaxine XR (EFFEXOR-XR) 75 MG 24 hr capsule  2. Bipolar 2 disorder (HCC) F31.81 divalproex (DEPAKOTE ER) 250 MG 24 hr tablet    lithium carbonate (LITHOBID) 300 MG CR tablet    venlafaxine XR (EFFEXOR-XR) 75 MG 24 hr capsule  3. Panic 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 F41.0 LORazepam (ATIVAN) 1 MG tablet    venlafaxine XR (EFFEXOR-XR) 75 MG 24 hr capsule    Past Psychiatric History:  Anxiety:Yes Bipolar Disorder:No Depression:Yes Mania:No Psychosis:No Schizophrenia:No Personality Disorder:No Hospitalization for psychiatric illness:No History of Electroconvulsive Shock Therapy:No Prior Suicide Attempts:No    Past Medical History:  Past Medical History:  Diagnosis Date  . Acute kidney failure (HCC) 01/2012   . Anxiety   . Bipolar disorder (HCC)    hypomania  . COPD (chronic obstructive pulmonary disease) (HCC)   . Depression   . Low back pain     Past Surgical History:  Procedure Laterality Date  . NO PAST SURGERIES      Family Psychiatric History:  Family History  Problem Relation Age of Onset  . Hypertension Other   . Anxiety disorder Mother   . Alcohol abuse Father   . Drug abuse Father   . Bipolar disorder Sister   . Alcohol abuse Brother   . Drug abuse Brother   . Bipolar disorder Paternal Aunt   . Schizophrenia Paternal Aunt   . Cancer Neg Hx   . Diabetes Neg Hx   . Heart disease Neg Hx   . Hyperlipidemia Neg Hx   . Stroke Neg Hx   . Suicidality Neg Hx     Social History:  Social History   Socioeconomic History  . Marital status: Married    Spouse name: Not on file  . Number of children: Not on file  . Years of education: Not on file  . Highest education level: Not on file  Occupational History  . Not on file  Social Needs  . Financial resource strain: Not on file  . Food insecurity:    Worry: Not on file    Inability: Not on file  . Transportation needs:    Medical: Not on file  Non-medical: Not on file  Tobacco Use  . Smoking status: Current Every Day Smoker    Packs/day: 1.00    Years: 20.00    Pack years: 20.00    Types: Cigarettes  . Smokeless tobacco: Never Used  Substance and Sexual Activity  . Alcohol use: No  . Drug use: No  . Sexual activity: Yes    Partners: Female    Birth control/protection: None  Lifestyle  . Physical activity:    Days per week: Not on file    Minutes per session: Not on file  . Stress: Not on file  Relationships  . Social connections:    Talks on phone: Not on file    Gets together: Not on file    Attends religious service: Not on file    Active member of club or organization: Not on file    Attends meetings of clubs or organizations: Not on file    Relationship status: Not on file  Other Topics Concern   . Not on file  Social History Narrative  . Not on file    Allergies:  Allergies  Allergen Reactions  . Ciprofloxacin     Acute kidney failure    Metabolic Disorder Labs: Lab Results  Component Value Date   HGBA1C 5.2 03/12/2018   MPG 103 02/03/2014   Lab Results  Component Value Date   PROLACTIN 15.7 (H) 03/12/2018   PROLACTIN 6.4 04/24/2017   Lab Results  Component Value Date   CHOL 155 03/12/2018   TRIG 360 (H) 03/12/2018   HDL 26 (L) 03/12/2018   CHOLHDL 5 01/21/2011   VLDL 34.8 01/21/2011   LDLCALC 57 03/12/2018   LDLCALC 50 04/24/2017   Lab Results  Component Value Date   TSH 1.850 03/12/2018   TSH 2.020 04/24/2017    Therapeutic Level Labs: Lab Results  Component Value Date   LITHIUM 0.6 03/12/2018   LITHIUM 0.9 04/24/2017   Lab Results  Component Value Date   VALPROATE 34 (L) 03/12/2018   VALPROATE 46 (L) 04/24/2017   No components found for:  CBMZ  Current Medications: Current Outpatient Medications  Medication Sig Dispense Refill  . baclofen (LIORESAL) 10 MG tablet Take 10 mg by mouth as needed for muscle spasms.    . busPIRone (BUSPAR) 15 MG tablet Take 1 tablet (15 mg total) by mouth 2 (two) times daily. 60 tablet 2  . celecoxib (CELEBREX) 100 MG capsule Take 100 mg by mouth as needed for mild pain.     . cyclobenzaprine (FLEXERIL) 10 MG tablet Take 1 tablet (10 mg total) by mouth 3 (three) times daily as needed. (Patient taking differently: Take 10 mg by mouth 3 (three) times daily as needed for muscle spasms. ) 30 tablet 0  . divalproex (DEPAKOTE ER) 250 MG 24 hr tablet Take one tab po qAM and 2 tabs po qPM 90 tablet 2  . gabapentin (NEURONTIN) 300 MG capsule Take 300 mg by mouth daily as needed. For pain    . lithium carbonate (LITHOBID) 300 MG CR tablet Take 4 tablets (1,200 mg total) by mouth at bedtime. 120 tablet 2  . LORazepam (ATIVAN) 1 MG tablet Take 1 tablet (1 mg total) by mouth 3 (three) times daily as needed for anxiety. 90  tablet 2  . tadalafil (CIALIS) 5 MG tablet TAKE 1 TABLET (5 MG TOTAL) BY MOUTH DAILY AS NEEDED FOR ERECTILE DYSFUNCTION. 10 tablet 1  . venlafaxine XR (EFFEXOR-XR) 75 MG 24 hr capsule Take 3  capsules (225 mg total) by mouth daily with breakfast. 90 capsule 2  . HYDROcodone-acetaminophen (NORCO) 10-325 MG tablet Take 1-2 tablets by mouth every 6 (six) hours as needed. (Patient not taking: Reported on 05/14/2018) 15 tablet 0   No current facility-administered medications for this visit.      Musculoskeletal: Strength & Muscle Tone: normal Gait & Station: normal Patient leans: straight  Psychiatric Specialty Exam: Review of Systems  Constitutional: Negative for chills, diaphoresis and fever.  Respiratory: Negative for cough, sputum production and shortness of breath.     Blood pressure 128/80, height 6\' 5"  (1.956 m), weight 232 lb (105.2 kg).Body mass index is 27.51 kg/m.  General Appearance: Well Groomed  Eye Contact:  Good  Speech:  Clear and Coherent and Normal Rate  Volume:  Normal  Mood:  Euthymic  Affect:  Constricted  Thought Process:  Goal Directed and Descriptions of Associations: Intact  Orientation:  Full (Time, Place, and Person)  Thought Content:  Logical  Suicidal Thoughts:  No  Homicidal Thoughts:  No  Memory:  Immediate;   Good  Judgement:  Good  Insight:  Good  Psychomotor Activity:  Normal  Concentration:  Concentration: Good  Recall:  Good  Fund of Knowledge:  Good  Language:  Good  Akathisia:  No  Handed:  Right  AIMS (if indicated):     Assets:  Communication Skills Desire for Improvement Financial Resources/Insurance Housing Intimacy Resilience Social Support Talents/Skills Transportation Vocational/Educational  ADL's:  Intact  Cognition:  WNL  Sleep:   good         Screenings: PHQ2-9     Office Visit from 12/22/2015 in [facility]  PHQ-2 Total Score  0      I reviewed the information below on 06/18/2018 and  have updated it Assessment and Plan: Bipolar II d/o- depressed; Panic 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; GAD; Insomnia   Medication management with supportive therapy. Risks and benefits, side effects and alternative treatment options discussed with patient. Pt was given an opportunity to ask questions about medication, illness, and treatment. All current psychiatric medications have been reviewed and discussed with the patient and adjusted as clinically appropriate. The patient has been provided an accurate and updated list of the medications being now prescribed. Patient expressed understanding of how their medications were to be used. Pt verbalized understanding and verbal consent obtained for treatment.  Status of current problems: Improved depression and anxiety  Meds:Lithobid 1200mg  po qHS for Bipolar disorder Depakote ER 250mg  po qAM and 500mg  po qHS for Bipolar disorder Buspar 15mg  po BID for GAD Ativan 1mg  po TID prn anxiety for GAD Effexor XR 225mg  po qD for depression, GAD and panic attacks Cialis 5mg  po prn sexual activity- pt reports it is effective and he denies SE   Labs: None   Therapy: brief supportive therapy provided. Discussed psychosocial stressors in detail. Discussed the importance of self care and came up with several possible ways to decrease his stress and responsibilities.  We discussed the need for self care over the holidays on his time off. I encouraged the patient to set up work boundaries and to follow through.  I encouraged him to be active and present went at home.  We discussed effective communication and using "I" talk  Consultations: Encouraged to follow up with PCP as needed. Pt declined therapy  Pt reports he is no longer experiencing SI. He is at an acute low risk for suicide. He has no prior hx of suicide  attempts. He has + social support, responsibility towards family and compliance with meds. Patient told to call clinic if any problems occur.  Patient advised to go to ER if they should develop SI/HI, side effects, or if symptoms worsen. Has crisis numbers to call if needed. Pt verbalized understanding.  F/up in12 weekssooner if needed   [name], MD 06/18/2018, 3:33 PM

## 2018-06-19 MED FILL — DIVALPROEX SOD ER 250 MG TA: 250 | 30 days supply | Qty: 90 | Fill #0

## 2018-06-19 MED FILL — busPIRone HCL 15 MG TABS: 15 | 30 days supply | Qty: 60 | Fill #0

## 2018-06-19 MED FILL — LITHIUM CARBONATE ER 300 MG: 300 | 30 days supply | Qty: 120 | Fill #0

## 2018-07-07 MED FILL — LORazepam 1 MG TABS: 1 | 30 days supply | Qty: 90 | Fill #0

## 2018-07-15 MED FILL — LITHIUM CARBONATE ER 300 MG: 300 | 30 days supply | Qty: 120 | Fill #1

## 2018-07-15 MED FILL — DIVALPROEX SOD ER 250 MG TA: 250 | 30 days supply | Qty: 90 | Fill #1

## 2018-07-15 MED FILL — busPIRone HCL 15 MG TABS: 15 | 30 days supply | Qty: 60 | Fill #1

## 2018-07-15 MED FILL — VENLAFAXINE HCL ER 75 MG CA: 75 | 30 days supply | Qty: 90 | Fill #1

## 2018-08-06 DIAGNOSIS — M5126 Other intervertebral disc displacement, lumbar region: Secondary | ICD-10-CM | POA: Diagnosis not present

## 2018-08-06 MED FILL — predniSONE 10 MG TABS: 10 | 6 days supply | Qty: 21 | Fill #0

## 2018-08-17 MED FILL — TADALAFIL 5 MG TABS: 5 | 50 days supply | Qty: 10 | Fill #1

## 2018-08-17 MED FILL — busPIRone HCL 15 MG TABS: 15 | 30 days supply | Qty: 60 | Fill #2

## 2018-08-17 MED FILL — VENLAFAXINE HCL ER 75 MG CA: 75 | 30 days supply | Qty: 90 | Fill #2

## 2018-08-17 MED FILL — DIVALPROEX SOD ER 250 MG TA: 250 | 30 days supply | Qty: 90 | Fill #2

## 2018-08-17 MED FILL — LITHIUM CARBONATE ER 300 MG: 300 | 30 days supply | Qty: 120 | Fill #2

## 2018-08-17 MED FILL — LORazepam 1 MG TABS: 1 | 30 days supply | Qty: 90 | Fill #1

## 2018-08-26 ENCOUNTER — Telehealth: Payer: Self-pay

## 2018-08-26 NOTE — Telephone Encounter (Signed)
Copied from CRM [id number]. Topic: General - Other >> Aug 26, 2018  4:39 PM Marylen Ponto wrote: Reason for CRM: Pt requested that a message be sent to Dr. Beverely Low asking if she will accept him as a new pt. Pt requests call back. Cb# [phone]

## 2018-08-26 NOTE — Telephone Encounter (Signed)
Can we please schedule pt at his convenience.

## 2018-08-26 NOTE — Telephone Encounter (Signed)
Ok to establish care.

## 2018-08-27 ENCOUNTER — Ambulatory Visit (INDEPENDENT_AMBULATORY_CARE_PROVIDER_SITE_OTHER): Payer: 59 | Admitting: Psychiatry

## 2018-08-27 ENCOUNTER — Encounter (HOSPITAL_COMMUNITY): Payer: Self-pay | Admitting: Psychiatry

## 2018-08-27 VITALS — BP 132/78 | Ht 77.0 in | Wt 242.0 lb

## 2018-08-27 DIAGNOSIS — F411 Generalized anxiety disorder: Secondary | ICD-10-CM

## 2018-08-27 DIAGNOSIS — F410 Panic disorder [episodic paroxysmal anxiety] without agoraphobia: Secondary | ICD-10-CM | POA: Diagnosis not present

## 2018-08-27 DIAGNOSIS — F3181 Bipolar II disorder: Secondary | ICD-10-CM

## 2018-08-27 NOTE — Patient Instructions (Signed)
Dr. Annett Gula- [facility] outpt behavior clinic

## 2018-08-27 NOTE — Progress Notes (Signed)
BH MD/PA/NP OP Progress Note  08/27/2018 2:08 PM Matthew DarbyRobert L Flater [name]  MRN:  [id number]  Chief Complaint:  Chief Complaint    Follow-up     HPI: Patient is here with his wife.  Patient tells me that the last month has been terrible.  His depression is much worse.  He has low motivation and hopelessness.  He is easily irritated and feels down most of the time.  He has a good day here or there but overall he remains very depressed.  He has had on and off suicidal thoughts.  On 2 occasions the suicidal thoughts were very intense.  One time he got into his car and went looking for a place to kill himself but could not find one.  Another time he was feeling "done" and was looking for a gun.  He owns several guns but does not have a handgun.  He felt a handgun would be less messy.  He has not purchased a handgun and wife tells me that all guns have been locked away.  At this point he knows that he needs help.  He has been taking his medications but did not feel that they are very effective and he is tired of having all of the symptoms.  He denies any manic or hypomanic-like symptoms.  He reports that his anxiety has significantly increased.  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  He states that inpatient psych hospitalization would be intolerable to him.  He does not feel comfortable being locked away and is worried about the stigma associated with being on the psych unit.  This is of special concern to him as he does work in the [employer] system.  He is willing to consider ECT as he really wants help.  He states he does not even know what normal is for him anymore.    Visit Diagnosis:    ICD-10-CM   1. Bipolar 2 disorder (HCC) F31.81   2. GAD (generalized anxiety disorder) F41.1   3. Panic 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 F41.0     Past Psychiatric History:  Anxiety:Yes Bipolar  Disorder:No Depression:Yes Mania:No Psychosis:No Schizophrenia:No Personality Disorder:No Hospitalization for psychiatric illness:No History of Electroconvulsive Shock Therapy:No Prior Suicide Attempts:No    Past Medical History:  Past Medical History:  Diagnosis Date  . Acute kidney failure (HCC) 01/2012  . Anxiety   . Bipolar disorder (HCC)    hypomania  . COPD (chronic obstructive pulmonary disease) (HCC)   . Depression   . Low back pain     Past Surgical History:  Procedure Laterality Date  . NO PAST SURGERIES      Family Psychiatric History:  Family History  Problem Relation Age of Onset  . Hypertension Other   . Anxiety disorder Mother   . Alcohol abuse Father   . Drug abuse Father   . Bipolar disorder Sister   . Alcohol abuse Brother   . Drug abuse Brother   . Bipolar disorder Paternal Aunt   . Schizophrenia Paternal Aunt   . Cancer Neg Hx   . Diabetes Neg Hx   . Heart disease Neg Hx   . Hyperlipidemia Neg Hx   . Stroke Neg Hx   . Suicidality Neg Hx     Social History:  Social History   Socioeconomic History  . Marital status: Married    Spouse name: Not on file  . Number of children: Not on file  . Years  of education: Not on file  . Highest education level: Not on file  Occupational History  . Not on file  Social Needs  . Financial resource strain: Not on file  . Food insecurity:    Worry: Not on file    Inability: Not on file  . Transportation needs:    Medical: Not on file    Non-medical: Not on file  Tobacco Use  . Smoking status: Current Every Day Smoker    Packs/day: 1.00    Years: 20.00    Pack years: 20.00    Types: Cigarettes  . Smokeless tobacco: Never Used  Substance and Sexual Activity  . Alcohol use: No  . Drug use: No  . Sexual activity: Yes    Partners: Female    Birth control/protection: None  Lifestyle  . Physical activity:    Days per week: Not on file    Minutes per session: Not on file  . Stress:  Not on file  Relationships  . Social connections:    Talks on phone: Not on file    Gets together: Not on file    Attends religious service: Not on file    Active member of club or organization: Not on file    Attends meetings of clubs or organizations: Not on file    Relationship status: Not on file  Other Topics Concern  . Not on file  Social History Narrative  . Not on file    Allergies:  Allergies  Allergen Reactions  . Ciprofloxacin     Acute kidney failure    Metabolic Disorder Labs: Lab Results  Component Value Date   HGBA1C 5.2 03/12/2018   MPG 103 02/03/2014   Lab Results  Component Value Date   PROLACTIN 15.7 (H) 03/12/2018   PROLACTIN 6.4 04/24/2017   Lab Results  Component Value Date   CHOL 155 03/12/2018   TRIG 360 (H) 03/12/2018   HDL 26 (L) 03/12/2018   CHOLHDL 5 01/21/2011   VLDL 34.8 01/21/2011   LDLCALC 57 03/12/2018   LDLCALC 50 04/24/2017   Lab Results  Component Value Date   TSH 1.850 03/12/2018   TSH 2.020 04/24/2017    Therapeutic Level Labs: Lab Results  Component Value Date   LITHIUM 0.6 03/12/2018   LITHIUM 0.9 04/24/2017   Lab Results  Component Value Date   VALPROATE 34 (L) 03/12/2018   VALPROATE 46 (L) 04/24/2017   No components found for:  CBMZ  Current Medications: Current Outpatient Medications  Medication Sig Dispense Refill  . baclofen (LIORESAL) 10 MG tablet Take 10 mg by mouth as needed for muscle spasms.    . busPIRone (BUSPAR) 15 MG tablet Take 1 tablet (15 mg total) by mouth 2 (two) times daily. 60 tablet 2  . celecoxib (CELEBREX) 100 MG capsule Take 100 mg by mouth as needed for mild pain.     . cyclobenzaprine (FLEXERIL) 10 MG tablet Take 1 tablet (10 mg total) by mouth 3 (three) times daily as needed. (Patient taking differently: Take 10 mg by mouth 3 (three) times daily as needed for muscle spasms. ) 30 tablet 0  . divalproex (DEPAKOTE ER) 250 MG 24 hr tablet Take one tab po qAM and 2 tabs po qPM 90 tablet  2  . gabapentin (NEURONTIN) 300 MG capsule Take 300 mg by mouth daily as needed. For pain    . lithium carbonate (LITHOBID) 300 MG CR tablet Take 4 tablets (1,200 mg total) by mouth at bedtime. 120  tablet 2  . LORazepam (ATIVAN) 1 MG tablet Take 1 tablet (1 mg total) by mouth 3 (three) times daily as needed for anxiety. 90 tablet 2  . tadalafil (CIALIS) 5 MG tablet TAKE 1 TABLET (5 MG TOTAL) BY MOUTH DAILY AS NEEDED FOR ERECTILE DYSFUNCTION. 10 tablet 1  . venlafaxine XR (EFFEXOR-XR) 75 MG 24 hr capsule Take 3 capsules (225 mg total) by mouth daily with breakfast. 90 capsule 2  . HYDROcodone-acetaminophen (NORCO) 10-325 MG tablet Take 1-2 tablets by mouth every 6 (six) hours as needed. (Patient not taking: Reported on 05/14/2018) 15 tablet 0   No current facility-administered medications for this visit.      Musculoskeletal: Strength & Muscle Tone: normal Gait & Station: normal Patient leans: straight   Psychiatric Specialty Exam: Review of Systems  Constitutional: Negative for chills, diaphoresis and fever.  Psychiatric/Behavioral: Positive for depression and suicidal ideas. The patient is nervous/anxious.     Blood pressure 132/78, height 6\' 5"  (1.956 m), weight 242 lb (109.8 kg).Body mass index is 28.7 kg/m.  General Appearance: Fairly Groomed  Eye Contact:  Good  Speech:  Clear and Coherent and Normal Rate  Volume:  Normal  Mood:  Depressed  Affect:  Congruent  Thought Process:  Goal Directed and Descriptions of Associations: Intact  Orientation:  Full (Time, Place, and Person)  Thought Content:  Logical  Suicidal Thoughts:  Yes.  without intent/plan  Homicidal Thoughts:  No  Memory:  Immediate;   Good  Judgement:  Good  Insight:  Good  Psychomotor Activity:  Normal  Concentration:  Concentration: Good  Recall:  Good  Fund of Knowledge:  Good  Language:  Good  Akathisia:  No  Handed:  Right  AIMS (if indicated):     Assets:  Communication Skills Desire for  Improvement Financial Resources/Insurance Housing Intimacy Leisure Time Social Support Talents/Skills Transportation Vocational/Educational  ADL's:  Intact  Cognition:  WNL  Sleep:    fair          Screenings: PHQ2-9     [facility]  PHQ-2 Total Score  0      I reviewed the information below on 08/27/2018 and have updated it Assessment and Plan: Bipolar II d/o- depressed; Panic 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; GAD; Insomnia   Medication management with supportive therapy. Risks and benefits, side effects and alternative treatment options discussed with patient. Pt was given an opportunity to ask questions about medication, illness, and treatment. All current psychiatric medications have been reviewed and discussed with the patient and adjusted as clinically appropriate. The patient has been provided an accurate and updated list of the medications being now prescribed. Patient expressed understanding of how their medications were to be used. Pt verbalized understanding and verbal consent obtained for treatment.  Status of current problems: worsening depression and SI  Meds:Lithobid 1200mg  po qHS for Bipolar disorder Depakote ER 250mg  po qAM and 500mg  po qHS for Bipolar disorder Buspar 15mg  po BID for GAD Ativan 1mg  po TID prn anxiety for GAD Effexor XR 225mg  po qD for depression, GAD and panic attacks Cialis 5mg  po prn sexual activity- pt reports it is effective and he denies SE   Labs: None   Therapy: brief supportive therapy provided. Discussed psychosocial stressors in detail.   Consultations: referred for ECT. Sp[facility] with [facility] Del[facility]acs [facility] who set up an evaluation for ECT on August 28, 2017 at 1pm. Pt was given the info and is agreeable to  meeting.  Pt reports he is experiencing SI on and off. At times the thoughts are intense and he experiences nonspecific plans but denies any intent. He is at an  acute moderate risk. At this time he can contract for safety and his wife is willing to monitor and call for help if and when needed. He has no prior hx of suicide attempts. He has + social support, responsibility towards family and compliance with meds. Patient told to call clinic if any problems occur. Patient advised to go to ER if they should develop new SI/HI, side effects, or if symptoms worsen. Has crisis numbers to call if needed. Pt and wifeverbalized understanding. Pt declined inpt psych treatment and in my opinion IVC to inpatient psychiatric unit would be detrimental to his mental health   F/up in2 weekssooner if needed  The duration of this appointment visit was 50 minutes of face-to-face time with the patient.  Greater than 50% of this time was spent in counseling, explanation of  diagnosis, planning of further management, and coordination of care  [name], MD 08/27/2018, 2:08 PM

## 2018-08-27 NOTE — Telephone Encounter (Signed)
LM asking pt to call back to schedule his NP appt with Tabori.

## 2018-08-28 ENCOUNTER — Encounter
Admission: RE | Admit: 2018-08-28 | Discharge: 2018-08-28 | Disposition: A | Discharge status: Home / Self Care | Payer: 59 | Source: Ambulatory Visit | Attending: Psychiatry | Admitting: Psychiatry

## 2018-08-28 ENCOUNTER — Ambulatory Visit
Admission: RE | Admit: 2018-08-28 | Discharge: 2018-08-28 | Disposition: A | Discharge status: Home / Self Care | Payer: 59 | Source: Ambulatory Visit | Attending: Psychiatry | Admitting: Psychiatry

## 2018-08-28 ENCOUNTER — Telehealth (HOSPITAL_COMMUNITY): Payer: Self-pay | Admitting: *Deleted

## 2018-08-28 ENCOUNTER — Telehealth: Payer: Self-pay | Admitting: Psychiatry

## 2018-08-28 ENCOUNTER — Other Ambulatory Visit: Payer: Self-pay

## 2018-08-28 ENCOUNTER — Telehealth: Payer: Self-pay

## 2018-08-28 DIAGNOSIS — Z01818 Encounter for other preprocedural examination: Secondary | ICD-10-CM | POA: Insufficient documentation

## 2018-08-28 DIAGNOSIS — F319 Bipolar disorder, unspecified: Secondary | ICD-10-CM | POA: Diagnosis not present

## 2018-08-28 LAB — BASIC METABOLIC PANEL
ANION GAP: 8 (ref 5–15)
BUN: 9 mg/dL (ref 6–20)
CALCIUM: 9 mg/dL (ref 8.9–10.3)
CHLORIDE: 107 mmol/L (ref 98–111)
CO2: 23 mmol/L (ref 22–32)
CREATININE: 0.74 mg/dL (ref 0.61–1.24)
GFR calc Af Amer: >60 mL/min (ref >60)
GFR calc non Af Amer: >60 mL/min (ref >60)
Glucose, Bld: 96 mg/dL (ref 70–99)
Potassium: 4.1 mmol/L (ref 3.5–5.1)
SODIUM: 138 mmol/L (ref 135–145)

## 2018-08-28 LAB — CBC
HCT: 46.4 % (ref 39.0–52.0)
Hemoglobin: 15.4 g/dL (ref 13.0–17.0)
MCH: 30.9 pg (ref 26.0–34.0)
MCHC: 33.2 g/dL (ref 30.0–36.0)
MCV: 93.2 fL (ref 80.0–100.0)
PLATELETS: 208 10*3/uL (ref 150–400)
RBC: 4.98 MIL/uL (ref 4.22–5.81)
RDW: 12.9 % (ref 11.5–15.5)
WBC: 9.6 10*3/uL (ref 4.0–10.5)
nRBC: 0 % (ref 0.0–0.2)

## 2018-08-28 NOTE — H&P (Signed)
ECT: This is an ECT evaluation for this 48 year old man with a history of depression and bipolar disorder.  Patient was referred by his outpatient psychiatrist, Dr. Scotty Court.  48 year old man seen for evaluation for ECT.  Patient complains of mood which feels down and depressed almost all of the time.  Has been going on severely for at least the last month.  Will have short periods of time usually less than a day of feeling slightly better but overall has been feeling negative down and hopeless consistently.  Energy level low.  Feels like his productivity and motivation are low.  Has had intermittent suicidal thoughts.  Most recently had more serious suicidal thoughts shortly before Christmas.  Still feels hopeless at times although he denies any hallucinations and denies any psychotic symptoms.  Denies any current intention to try to kill himself.  Patient feels that his job is stressful but also enjoyable.  Feels the same way about his family life.  Could not pinpoint any specific major life stressor that is driving his mood.  Patient sees an outpatient psychiatrist and is on medication for depression and mood stability with only partial improvement.  He does not drink regularly.  Does not use any other recreational drugs.  He was referred for ECT evaluation because of the severity of his depression combined with lack of response to medication.  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  Patient is married has 4 children at home.  Medical history: Smokes.  Very mild COPD.  Otherwise no significant medical problems outside of the chronic psychiatric except for low back pain for which she takes the occasional muscle relaxer.  Substance abuse history: Drinks occasionally but denies ever having had a significant problem with it.  No documentation of any prior substance abuse issues.  Not  currently on any narcotics.  Mental status exam: Neatly dressed and groomed man on time for appointment cooperative and appropriate.  Overall affect is dysphoric down and appears tired.  Speech is a little bit slow but he is able to have a normal interview.  Thoughts are lucid no sign of loosening of associations or delusions.  Suicidal thoughts which are very passive without any intention to act on them.  No homicidal ideation.  Short and long-term memory grossly intact.  Ability to currently function and make decisions about healthcare is all intact.  Past psychiatric history: This is a 48 year old man with severe recurrent depression.  Also possible bipolar disorder.  Has had some problems in the past with anger issues.  Patient has been on multiple antidepressants and mood stabilizers.  Currently on venlafaxine 225 mg/day, lithium and Depakote as well as as needed Ativan.  Has found all of the medicines to be at least partially helpful for him.  Denies any prior hospitalizations.  Has not actually carried through on any suicide attempts in the past.  No clear history of psychotic symptoms.  Assessment: 47 year old man currently having a severe major depressive episode causing significant impairment of functioning and quality of life.  Has been on very reasonable medication trials without improvement to the point of better functioning.  Patient has no contraindications to ECT treatment.  He is a good candidate for a trial of ECT.  Plan: Patient was given opportunity to ask questions as much as he wanted.  Treatment plan was suggested and described to the patient.  Recommend right unilateral ECT treatment with an anticipated course of 6-10 treatments give or take depending on response and tolerance.  Patient should be able to start on Monday, January 13.  He is going to go get his lab studies done today.  I have requested that he discontinue his lithium as of Sunday and not take any Ativan prior to the  treatment.  Case reviewed with ECT nursing.  Utilization review will be informed.  Patient currently agreeable to plan.  He has been apprised of the potential side effects including cognitive impairment and short-term memory loss as well as tachycardia aches and pains and headache.

## 2018-08-28 NOTE — Telephone Encounter (Signed)
Called [facility] [phone] for prior authorization of ECT spoke with Enrigue Catena H who states that no authorization is required.

## 2018-08-31 ENCOUNTER — Ambulatory Visit: Payer: Self-pay | Admitting: Anesthesiology

## 2018-08-31 ENCOUNTER — Encounter: Payer: Self-pay | Admitting: Anesthesiology

## 2018-08-31 ENCOUNTER — Ambulatory Visit
Admission: RE | Admit: 2018-08-31 | Discharge: 2018-08-31 | Disposition: A | Discharge status: Home / Self Care | Payer: 59 | Source: Ambulatory Visit | Attending: Psychiatry | Admitting: Psychiatry

## 2018-08-31 ENCOUNTER — Other Ambulatory Visit: Payer: Self-pay | Admitting: Psychiatry

## 2018-08-31 DIAGNOSIS — F17210 Nicotine dependence, cigarettes, uncomplicated: Secondary | ICD-10-CM | POA: Insufficient documentation

## 2018-08-31 DIAGNOSIS — F332 Major depressive disorder, recurrent severe without psychotic features: Secondary | ICD-10-CM | POA: Diagnosis not present

## 2018-08-31 DIAGNOSIS — M545 Low back pain: Secondary | ICD-10-CM | POA: Insufficient documentation

## 2018-08-31 DIAGNOSIS — J449 Chronic obstructive pulmonary disease, unspecified: Secondary | ICD-10-CM | POA: Diagnosis not present

## 2018-08-31 DIAGNOSIS — F3181 Bipolar II disorder: Secondary | ICD-10-CM | POA: Diagnosis not present

## 2018-08-31 MED ORDER — KETOROLAC TROMETHAMINE 30 MG/ML IJ SOLN
INTRAMUSCULAR | Status: AC
  Administered 2018-08-31: 30 mg via INTRAVENOUS
  Filled 2018-08-31: qty 1

## 2018-08-31 MED ORDER — SUCCINYLCHOLINE CHLORIDE 200 MG/10ML IV SOSY
PREFILLED_SYRINGE | INTRAVENOUS | Status: DC | PRN
  Administered 2018-08-31: 150 mg via INTRAVENOUS

## 2018-08-31 MED ORDER — KETOROLAC TROMETHAMINE 30 MG/ML IJ SOLN
30.0000 mg | Freq: Once | INTRAMUSCULAR | Status: AC
  Administered 2018-08-31: 30 mg via INTRAVENOUS

## 2018-08-31 MED ORDER — SODIUM CHLORIDE 0.9 % IV SOLN
500.0000 mL | Freq: Once | INTRAVENOUS | Status: AC
  Administered 2018-08-31: 500 mL via INTRAVENOUS

## 2018-08-31 MED ORDER — SUCCINYLCHOLINE CHLORIDE 20 MG/ML IJ SOLN
INTRAMUSCULAR | Status: AC
  Filled 2018-08-31: qty 1

## 2018-08-31 MED ORDER — GLYCOPYRROLATE 0.2 MG/ML IJ SOLN
INTRAMUSCULAR | Status: AC
  Administered 2018-08-31: 0.1 mg via INTRAVENOUS
  Filled 2018-08-31: qty 1

## 2018-08-31 MED ORDER — ONDANSETRON HCL 4 MG/2ML IJ SOLN: 4.0000 mg | Freq: Once | INTRAMUSCULAR | Status: DC | PRN

## 2018-08-31 MED ORDER — METHOHEXITAL SODIUM 100 MG/10ML IV SOSY
PREFILLED_SYRINGE | INTRAVENOUS | Status: DC | PRN
  Administered 2018-08-31: 120 mg via INTRAVENOUS

## 2018-08-31 MED ORDER — MIDAZOLAM HCL 2 MG/2ML IJ SOLN
2.0000 mg | Freq: Once | INTRAMUSCULAR | Status: AC
  Administered 2018-08-31: 2 mg via INTRAVENOUS

## 2018-08-31 MED ORDER — MIDAZOLAM HCL 2 MG/2ML IJ SOLN
INTRAMUSCULAR | Status: AC
  Filled 2018-08-31: qty 2

## 2018-08-31 MED ORDER — FENTANYL CITRATE (PF) 100 MCG/2ML IJ SOLN: 25.0000 ug | INTRAMUSCULAR | Status: DC | PRN

## 2018-08-31 MED ORDER — GLYCOPYRROLATE 0.2 MG/ML IJ SOLN
0.1000 mg | Freq: Once | INTRAMUSCULAR | Status: AC
  Administered 2018-08-31: 0.1 mg via INTRAVENOUS

## 2018-08-31 MED ORDER — SODIUM CHLORIDE 0.9 % IV SOLN
INTRAVENOUS | Status: DC | PRN
  Administered 2018-08-31: 09:00:00 via INTRAVENOUS

## 2018-08-31 NOTE — Anesthesia Preprocedure Evaluation (Signed)
Anesthesia Evaluation  Patient identified by MRN, date of birth, ID band Patient awake    Reviewed: Allergy & Precautions, H&P , NPO status , Patient's Chart, lab work & pertinent test results, reviewed documented beta blocker date and time   Airway Mallampati: II   Neck ROM: full    Dental  (+) Poor Dentition   Pulmonary asthma , COPD, Current Smoker,    Pulmonary exam normal        Cardiovascular Exercise Tolerance: Poor negative cardio ROS Normal cardiovascular exam Rhythm:regular Rate:Normal     Neuro/Psych PSYCHIATRIC DISORDERS Anxiety Depression Bipolar Disorder  Neuromuscular disease    GI/Hepatic negative GI ROS, Neg liver ROS,   Endo/Other  negative endocrine ROS  Renal/GU Renal disease  negative genitourinary   Musculoskeletal   Abdominal   Peds  Hematology negative hematology ROS (+)   Anesthesia Other Findings Past Medical History: 01/2012: Acute kidney failure (HCC) No date: Anxiety No date: Bipolar disorder (HCC)     Comment:  hypomania No date: COPD (chronic obstructive pulmonary disease) (HCC) No date: Depression No date: Low back pain Past Surgical History: No date: NO PAST SURGERIES BMI    Body Mass Index:  28.46 kg/m     Reproductive/Obstetrics negative OB ROS                             Anesthesia Physical Anesthesia Plan  ASA: II  Anesthesia Plan: General   Post-op Pain Management:    Induction:   PONV Risk Score and Plan:   Airway Management Planned:   Additional Equipment:   Intra-op Plan:   Post-operative Plan:   Informed Consent: I have reviewed the patients History and Physical, chart, labs and discussed the procedure including the risks, benefits and alternatives for the proposed anesthesia with the patient or authorized representative who has indicated his/her understanding and acceptance.   Dental Advisory Given  Plan Discussed with:  CRNA  Anesthesia Plan Comments:         Anesthesia Quick Evaluation

## 2018-08-31 NOTE — Transfer of Care (Signed)
Immediate Anesthesia Transfer of Care Note  Patient: Matthew [name]  Procedure(s) Performed: ECT TX  Patient Location: PACU  Anesthesia Type:General  Level of Consciousness: sedated  Airway & Oxygen Therapy: Patient Spontanous Breathing and Patient connected to face mask oxygen  Post-op Assessment: Report given to RN and Post -op Vital signs reviewed and stable  Post vital signs: Reviewed and stable  Last Vitals:  Vitals Value Taken Time  BP    Temp    Pulse    Resp    SpO2      Last Pain:  Vitals:   08/31/18 1100  TempSrc:   PainSc: 0-No pain         Complications: No apparent anesthesia complications

## 2018-08-31 NOTE — H&P (Signed)
Matthew [name] is an 48 y.o. male.   Chief Complaint: Major depression HPI: History of recurrent depression  Past Medical History:  Diagnosis Date  . Acute kidney failure (HCC) 01/2012  . Anxiety   . Bipolar disorder (HCC)    hypomania  . COPD (chronic obstructive pulmonary disease) (HCC)   . Depression   . Low back pain     Past Surgical History:  Procedure Laterality Date  . NO PAST SURGERIES      Family History  Problem Relation Age of Onset  . Hypertension Other   . Anxiety disorder Mother   . Alcohol abuse Father   . Drug abuse Father   . Bipolar disorder Sister   . Alcohol abuse Brother   . Drug abuse Brother   . Bipolar disorder Paternal Aunt   . Schizophrenia Paternal Aunt   . Cancer Neg Hx   . Diabetes Neg Hx   . Heart disease Neg Hx   . Hyperlipidemia Neg Hx   . Stroke Neg Hx   . Suicidality Neg Hx    Social History:  reports that he has been smoking cigarettes. He has a 20.00 pack-year smoking history. He has never used smokeless tobacco. He reports that he does not drink alcohol or use drugs.  Allergies:  Allergies  Allergen Reactions  . Ciprofloxacin     Acute kidney failure    (Not in a hospital admission)   No results found for this or any previous visit (from the past 48 hour(s)). No results found.  Review of Systems  Constitutional: Negative.   HENT: Negative.   Eyes: Negative.   Respiratory: Negative.   Cardiovascular: Negative.   Gastrointestinal: Negative.   Musculoskeletal: Negative.   Skin: Negative.   Neurological: Negative.   Psychiatric/Behavioral: Positive for depression and suicidal ideas. Negative for hallucinations, memory loss and substance abuse. The patient is not nervous/anxious and does not have insomnia.     Blood pressure 126/83, pulse 83, temperature 98.1 F (36.7 C), temperature source Oral, height 6\' 5"  (1.956 m), weight 108.9 kg, SpO2 98 %. Physical Exam  Nursing note and vitals  reviewed. Constitutional: He appears well-developed and well-nourished.  HENT:  Head: Normocephalic and atraumatic.  Eyes: Pupils are equal, round, and reactive to light. Conjunctivae are normal.  Neck: Normal range of motion.  Cardiovascular: Regular rhythm and normal heart sounds.  Respiratory: Effort normal. No respiratory distress.  GI: Soft.  Musculoskeletal: Normal range of motion.  Neurological: He is alert.  Skin: Skin is warm and dry.  Psychiatric: Judgment normal. His affect is blunt. His speech is delayed. He is slowed. Cognition and memory are normal. He expresses suicidal ideation. He expresses no suicidal plans.     Assessment/Plan Begin index course of ECT  [name], MD 08/31/2018, 10:04 AM

## 2018-08-31 NOTE — H&P (Signed)
Matthew [name] is an 48 y.o. male.   Chief Complaint: Major depression recurrent severe HPI: See above chronic recurrent depression minimal response to medication  Past Medical History:  Diagnosis Date  . Acute kidney failure (HCC) 01/2012  . Anxiety   . Bipolar disorder (HCC)    hypomania  . COPD (chronic obstructive pulmonary disease) (HCC)   . Depression   . Low back pain     Past Surgical History:  Procedure Laterality Date  . NO PAST SURGERIES      Family History  Problem Relation Age of Onset  . Hypertension Other   . Anxiety disorder Mother   . Alcohol abuse Father   . Drug abuse Father   . Bipolar disorder Sister   . Alcohol abuse Brother   . Drug abuse Brother   . Bipolar disorder Paternal Aunt   . Schizophrenia Paternal Aunt   . Cancer Neg Hx   . Diabetes Neg Hx   . Heart disease Neg Hx   . Hyperlipidemia Neg Hx   . Stroke Neg Hx   . Suicidality Neg Hx    Social History:  reports that he has been smoking cigarettes. He has a 20.00 pack-year smoking history. He has never used smokeless tobacco. He reports that he does not drink alcohol or use drugs.  Allergies:  Allergies  Allergen Reactions  . Ciprofloxacin     Acute kidney failure    (Not in a hospital admission)   No results found for this or any previous visit (from the past 48 hour(s)). No results found.  Review of Systems  Constitutional: Negative.   HENT: Negative.   Eyes: Negative.   Respiratory: Negative.   Cardiovascular: Negative.   Gastrointestinal: Negative.   Musculoskeletal: Positive for back pain.  Skin: Negative.   Neurological: Negative.   Psychiatric/Behavioral: Positive for depression and suicidal ideas. Negative for hallucinations, memory loss and substance abuse. The patient is nervous/anxious and has insomnia.     Blood pressure 126/83, pulse 83, temperature 98.1 F (36.7 C), temperature source Oral, height 6\' 5"  (1.956 m), weight 108.9 kg, SpO2 98 %. Physical  Exam  Nursing note and vitals reviewed. Constitutional: He appears well-developed and well-nourished.  HENT:  Head: Normocephalic and atraumatic.  Eyes: Pupils are equal, round, and reactive to light. Conjunctivae are normal.  Neck: Normal range of motion.  Cardiovascular: Regular rhythm and normal heart sounds.  Respiratory: Effort normal.  GI: Soft.  Musculoskeletal: Normal range of motion.  Neurological: He is alert.  Skin: Skin is warm and dry.  Psychiatric: Judgment normal. His affect is blunt. His speech is delayed. He is slowed. Cognition and memory are normal. He expresses suicidal ideation. He expresses no suicidal plans.     Assessment/Plan Treatment today beginning a course of index ECT starting with right unilateral treatment next treatment Wednesday  [name], MD 08/31/2018, 10:52 AM

## 2018-08-31 NOTE — Discharge Instructions (Signed)
1)  The drugs that you have been given will stay in your system until tomorrow so for the       next 24 hours you should not:  A. Drive an automobile  B. Make any legal decisions  C. Drink any alcoholic beverages  2)  You may resume your regular meals upon return home.  3)  A responsible adult must take you home.  Someone should stay with you for a few          hours, then be available by phone for the remainder of the treatment day.  4)  You May experience any of the following symptoms:  Headache, Nausea and a dry mouth (due to the medications you were given),  temporary memory loss and some confusion, or sore muscles (a warm bath  should help this).  If you you experience any of these symptoms let us know on                your return visit.  5)  Report any of the following: any acute discomfort, severe headache, or temperature        greater than 100.5 F.   Also report any unusual redness, swelling, drainage, or pain         at your IV site.    You may report Symptoms to:  ECT PROGRAM- [place] [facility]          Phone: [phone], ECT Department           or Dr. Shary Key office [phone]  6)  Your next ECT Treatment is Wednesday January 15 at 0830  We will call 2 days prior to your scheduled appointment for arrival times.  7)  Nothing to eat or drink after midnight the night before your procedure.  8)  Take    With a sip of water the morning of your procedure.  9)  Other Instructions: Call [phone] to cancel the morning of your procedure due         to illness or emergency.  10) We will call within 72 hours to assess how you are feeling.

## 2018-08-31 NOTE — Procedures (Signed)
ECT SERVICES Physician's Interval Evaluation & Treatment Note  Patient Identification: DORIS HEAD III MRN:  [id number] Date of Evaluation:  08/31/2018 TX #: 1  MADRS:   MMSE:   P.E. Findings:  No change in physical.  Unremarkable.  Psychiatric Interval Note:  Depressed down but lucid  Subjective:  Patient is a 48 y.o. male seen for evaluation for Electroconvulsive Therapy. Depressed no active suicidal intent no psychosis  Treatment Summary:   [x]   Right Unilateral             []  Bilateral   % Energy : 0.3 ms 50%   Impedance: 1750 ohms  Seizure Energy Index: 3776 V squared  Postictal Suppression Index: 55%  Seizure Concordance Index: 96%  Medications  Pre Shock: Robinul 0.1 mg Toradol 30 mg Brevital 120 mg succinylcholine 150 mg  Post Shock: Versed 2 mg  Seizure Duration: 44 seconds EMG 83 seconds EEG   Comments: Follow-up Wednesday  Lungs:  [x]   Clear to auscultation               []  Other:   Heart:    [x]   Regular rhythm             []  irregular rhythm    [x]   Previous H&P reviewed, patient examined and there are NO CHANGES                 []   Previous H&P reviewed, patient examined and there are changes noted.   [name], MD 1/13/202010:54 AM

## 2018-09-01 ENCOUNTER — Other Ambulatory Visit: Payer: Self-pay | Admitting: Psychiatry

## 2018-09-01 NOTE — Anesthesia Postprocedure Evaluation (Signed)
Anesthesia Post Note  Patient: Matthew [name]  Procedure(s) Performed: ECT TX  Patient location during evaluation: PACU Anesthesia Type: General Level of consciousness: awake and alert Pain management: pain level controlled Vital Signs Assessment: post-procedure vital signs reviewed and stable Respiratory status: spontaneous breathing, nonlabored ventilation, respiratory function stable and patient connected to nasal cannula oxygen Cardiovascular status: blood pressure returned to baseline and stable Postop Assessment: no apparent nausea or vomiting Anesthetic complications: no     Last Vitals:  Vitals:   08/31/18 1141 08/31/18 1201  BP: 127/89 128/84  Pulse: 75 71  Resp:  16  Temp:  37.1 C  SpO2: 98%     Last Pain:  Vitals:   08/31/18 1201  TempSrc: Oral  PainSc: 0-No pain                 Yevette Edwards

## 2018-09-02 ENCOUNTER — Encounter
Admission: RE | Admit: 2018-09-02 | Discharge: 2018-09-02 | Disposition: A | Discharge status: Home / Self Care | Payer: 59 | Source: Ambulatory Visit | Attending: Psychiatry | Admitting: Psychiatry

## 2018-09-02 ENCOUNTER — Encounter: Payer: Self-pay | Admitting: Anesthesiology

## 2018-09-02 DIAGNOSIS — F17210 Nicotine dependence, cigarettes, uncomplicated: Secondary | ICD-10-CM | POA: Insufficient documentation

## 2018-09-02 DIAGNOSIS — Z881 Allergy status to other antibiotic agents status: Secondary | ICD-10-CM | POA: Diagnosis not present

## 2018-09-02 DIAGNOSIS — F419 Anxiety disorder, unspecified: Secondary | ICD-10-CM | POA: Diagnosis not present

## 2018-09-02 DIAGNOSIS — J449 Chronic obstructive pulmonary disease, unspecified: Secondary | ICD-10-CM | POA: Diagnosis not present

## 2018-09-02 DIAGNOSIS — F319 Bipolar disorder, unspecified: Secondary | ICD-10-CM | POA: Diagnosis not present

## 2018-09-02 DIAGNOSIS — Z818 Family history of other mental and behavioral disorders: Secondary | ICD-10-CM | POA: Diagnosis not present

## 2018-09-02 DIAGNOSIS — F3181 Bipolar II disorder: Secondary | ICD-10-CM | POA: Diagnosis not present

## 2018-09-02 DIAGNOSIS — F332 Major depressive disorder, recurrent severe without psychotic features: Secondary | ICD-10-CM | POA: Diagnosis not present

## 2018-09-02 MED ORDER — MIDAZOLAM HCL 2 MG/2ML IJ SOLN
INTRAMUSCULAR | Status: AC
  Filled 2018-09-02: qty 2

## 2018-09-02 MED ORDER — SODIUM CHLORIDE 0.9 % IV SOLN
500.0000 mL | Freq: Once | INTRAVENOUS | Status: AC
  Administered 2018-09-02: 10:00:00 via INTRAVENOUS

## 2018-09-02 MED ORDER — ONDANSETRON HCL 4 MG/2ML IJ SOLN
INTRAMUSCULAR | Status: AC
  Administered 2018-09-02: 4 mg via INTRAVENOUS
  Filled 2018-09-02: qty 2

## 2018-09-02 MED ORDER — MIDAZOLAM HCL 2 MG/2ML IJ SOLN
2.0000 mg | Freq: Once | INTRAMUSCULAR | Status: AC
  Administered 2018-09-02: 2 mg via INTRAVENOUS

## 2018-09-02 MED ORDER — KETOROLAC TROMETHAMINE 30 MG/ML IJ SOLN
30.0000 mg | Freq: Once | INTRAMUSCULAR | Status: AC
  Administered 2018-09-02: 30 mg via INTRAVENOUS

## 2018-09-02 MED ORDER — ONDANSETRON HCL 4 MG/2ML IJ SOLN
4.0000 mg | Freq: Once | INTRAMUSCULAR | Status: AC
  Administered 2018-09-02: 4 mg via INTRAVENOUS

## 2018-09-02 MED ORDER — METHOHEXITAL SODIUM 100 MG/10ML IV SOSY
PREFILLED_SYRINGE | INTRAVENOUS | Status: DC | PRN
  Administered 2018-09-02: 120 mg via INTRAVENOUS

## 2018-09-02 MED ORDER — SUCCINYLCHOLINE CHLORIDE 20 MG/ML IJ SOLN
INTRAMUSCULAR | Status: AC
  Filled 2018-09-02: qty 1

## 2018-09-02 MED ORDER — GLYCOPYRROLATE 0.2 MG/ML IJ SOLN
INTRAMUSCULAR | Status: AC
  Administered 2018-09-02: 0.1 mg via INTRAVENOUS
  Filled 2018-09-02: qty 1

## 2018-09-02 MED ORDER — GLYCOPYRROLATE 0.2 MG/ML IJ SOLN
0.1000 mg | Freq: Once | INTRAMUSCULAR | Status: AC
  Administered 2018-09-02: 0.1 mg via INTRAVENOUS

## 2018-09-02 MED ORDER — KETOROLAC TROMETHAMINE 30 MG/ML IJ SOLN
INTRAMUSCULAR | Status: AC
  Administered 2018-09-02: 30 mg via INTRAVENOUS
  Filled 2018-09-02: qty 1

## 2018-09-02 MED ORDER — SUCCINYLCHOLINE CHLORIDE 20 MG/ML IJ SOLN
INTRAMUSCULAR | Status: DC | PRN
  Administered 2018-09-02: 150 mg via INTRAVENOUS

## 2018-09-02 NOTE — Anesthesia Procedure Notes (Addendum)
Date/Time: 09/02/2018 10:37 AM Performed by: Lily KocherPeralta, Novalie Leamy, CRNA Pre-anesthesia Checklist: Patient identified, Emergency Drugs available, Suction available and Patient being monitored Patient Re-evaluated:Patient Re-evaluated prior to induction Oxygen Delivery Method: Circle system utilized Preoxygenation: Pre-oxygenation with 100% oxygen Induction Type: IV induction Ventilation: Mask ventilation throughout procedure and Oral airway inserted - appropriate to patient size Airway Equipment and Method: Bite block Placement Confirmation: positive ETCO2 Dental Injury: Teeth and Oropharynx as per pre-operative assessment

## 2018-09-02 NOTE — Discharge Instructions (Addendum)
1)  The drugs that you have been given will stay in your system until tomorrow so for the       next 24 hours you should not:  A. Drive an automobile  B. Make any legal decisions  C. Drink any alcoholic beverages  2)  You may resume your regular meals upon return home.  3)  A responsible adult must take you home.  Someone should stay with you for a few          hours, then be available by phone for the remainder of the treatment day.  4)  You May experience any of the following symptoms:  Headache, Nausea and a dry mouth (due to the medications you were given),  temporary memory loss and some confusion, or sore muscles (a warm bath  should help this).  If you you experience any of these symptoms let us know on                your return visit.  5)  Report any of the following: any acute discomfort, severe headache, or temperature        greater than 100.5 F.   Also report any unusual redness, swelling, drainage, or pain         at your IV site.    You may report Symptoms to:  ECT PROGRAM- [place] [facility]          Phone: [phone], ECT Department           or Dr. Shary Key office [phone]  6)  Your next ECT Treatment is Friday January 17 at 8:30   We will call 2 days prior to your scheduled appointment for arrival times.  7)  Nothing to eat or drink after midnight the night before your procedure.  8)  Take      With a sip of water the morning of your procedure.  9)  Other Instructions: Call [phone] to cancel the morning of your procedure due         to illness or emergency.  10) We will call within 72 hours to assess how you are feeling.

## 2018-09-02 NOTE — Anesthesia Preprocedure Evaluation (Addendum)
Anesthesia Evaluation  Patient identified by MRN, date of birth, ID band Patient awake    Reviewed: Allergy & Precautions, H&P , NPO status , Patient's Chart, lab work & pertinent test results, reviewed documented beta blocker date and time   History of Anesthesia Complications Negative for: history of anesthetic complications  Airway Mallampati: II   Neck ROM: full    Dental  (+) Poor Dentition   Pulmonary neg shortness of breath, asthma , COPD, Current Smoker,    Pulmonary exam normal        Cardiovascular Exercise Tolerance: Poor Normal cardiovascular exam Rhythm:regular Rate:Normal     Neuro/Psych PSYCHIATRIC DISORDERS Anxiety Depression Bipolar Disorder  Neuromuscular disease    GI/Hepatic negative GI ROS, Neg liver ROS,   Endo/Other  negative endocrine ROS  Renal/GU Renal disease  negative genitourinary   Musculoskeletal   Abdominal   Peds  Hematology negative hematology ROS (+)   Anesthesia Other Findings Past Medical History: 01/2012: Acute kidney failure (HCC) No date: Anxiety No date: Bipolar disorder (HCC)     Comment:  hypomania No date: COPD (chronic obstructive pulmonary disease) (HCC) No date: Depression No date: Low back pain Past Surgical History: No date: NO PAST SURGERIES BMI    Body Mass Index:  28.46 kg/m     Reproductive/Obstetrics negative OB ROS                             Anesthesia Physical  Anesthesia Plan  ASA: III  Anesthesia Plan: General   Post-op Pain Management:    Induction: Intravenous  PONV Risk Score and Plan:   Airway Management Planned: Mask  Additional Equipment:   Intra-op Plan:   Post-operative Plan:   Informed Consent: I have reviewed the patients History and Physical, chart, labs and discussed the procedure including the risks, benefits and alternatives for the proposed anesthesia with the patient or authorized  representative who has indicated his/her understanding and acceptance.     Dental Advisory Given  Plan Discussed with: CRNA  Anesthesia Plan Comments: (Patient consented for risks of anesthesia including but not limited to:  - adverse reactions to medications - risk of intubation if required - damage to teeth, lips or other oral mucosa - sore throat or hoarseness - Damage to heart, brain, lungs or loss of life  Patient voiced understanding.)        Anesthesia Quick Evaluation  

## 2018-09-02 NOTE — Anesthesia Postprocedure Evaluation (Signed)
Anesthesia Post Note  Patient: Matthew [name]  Procedure(s) Performed: ECT TX  Patient location during evaluation: PACU Anesthesia Type: General Level of consciousness: awake and alert Pain management: pain level controlled Vital Signs Assessment: post-procedure vital signs reviewed and stable Respiratory status: spontaneous breathing, nonlabored ventilation, respiratory function stable and patient connected to nasal cannula oxygen Cardiovascular status: blood pressure returned to baseline and stable Postop Assessment: no apparent nausea or vomiting Anesthetic complications: no     Last Vitals:  Vitals:   09/02/18 1110 09/02/18 1124  BP: 129/82 119/87  Pulse: 68 67  Resp: 15 16  Temp: 36.7 C 37.1 C  SpO2: 99%     Last Pain:  Vitals:   09/02/18 1124  TempSrc: Oral  PainSc:                  [name] 

## 2018-09-02 NOTE — Procedures (Signed)
ECT SERVICES Physician's Interval Evaluation & Treatment Note  Patient Identification: Matthew [name] MRN:  [id number] Date of Evaluation:  09/02/2018 TX #: 2  MADRS:   MMSE:   P.E. Findings:  No change to physical exam.  Appears to be in good health.  Psychiatric Interval Note:  Patient not aware of any psychiatric changes.  Does not appear to be delirious.  Not agitated.  No suicidal intent.  Subjective:  Patient is a 48 y.o. male seen for evaluation for Electroconvulsive Therapy. Complains of minor aches and pains but did not feel overly impaired by them.  Treatment Summary:   [x]   Right Unilateral             []  Bilateral   % Energy : 0.3 ms 50%   Impedance: 2100 ohms  Seizure Energy Index: 2655 V squared  Postictal Suppression Index: 38%  Seizure Concordance Index: 93%  Medications  Pre Shock: Robinul 0.2 mg Toradol 30 mg Brevital 120 mg succinylcholine 150 mg  Post Shock: Versed 2 mg  Seizure Duration: 34 seconds EMG 67 seconds EEG   Comments: Treatments appear to be proceeding without any complication we will see him back on Friday  Lungs:  [x]   Clear to auscultation               []  Other:   Heart:    [x]   Regular rhythm             []  irregular rhythm    [x]   Previous H&P reviewed, patient examined and there are NO CHANGES                 []   Previous H&P reviewed, patient examined and there are changes noted.   [name], MD 1/15/20203:14 PM

## 2018-09-02 NOTE — Anesthesia Post-op Follow-up Note (Signed)
Anesthesia [facility] form completed.        

## 2018-09-02 NOTE — H&P (Signed)
Matthew [name] is an 48 y.o. male.   Chief Complaint: still depressed not actively suicidal HPI: recurrant depression   Past Medical History:  Diagnosis Date  . Acute kidney failure (HCC) 01/2012  . Anxiety   . Bipolar disorder (HCC)    hypomania  . COPD (chronic obstructive pulmonary disease) (HCC)   . Depression   . Low back pain     Past Surgical History:  Procedure Laterality Date  . NO PAST SURGERIES      Family History  Problem Relation Age of Onset  . Hypertension Other   . Anxiety disorder Mother   . Alcohol abuse Father   . Drug abuse Father   . Bipolar disorder Sister   . Alcohol abuse Brother   . Drug abuse Brother   . Bipolar disorder Paternal Aunt   . Schizophrenia Paternal Aunt   . Cancer Neg Hx   . Diabetes Neg Hx   . Heart disease Neg Hx   . Hyperlipidemia Neg Hx   . Stroke Neg Hx   . Suicidality Neg Hx    Social History:  reports that he has been smoking cigarettes. He has a 20.00 pack-year smoking history. He has never used smokeless tobacco. He reports that he does not drink alcohol or use drugs.  Allergies:  Allergies  Allergen Reactions  . Ciprofloxacin     Acute kidney failure    (Not in a hospital admission)   No results found for this or any previous visit (from the past 48 hour(s)). No results found.  Review of Systems  Constitutional: Negative.   HENT: Negative.   Eyes: Negative.   Respiratory: Negative.   Cardiovascular: Negative.   Gastrointestinal: Negative.   Musculoskeletal: Positive for myalgias.  Skin: Negative.   Neurological: Negative.   Psychiatric/Behavioral: Positive for depression. Negative for hallucinations, memory loss, substance abuse and suicidal ideas. The patient is not nervous/anxious and does not have insomnia.     Blood pressure (!) 138/91, pulse 78, temperature 98.8 F (37.1 C), temperature source Oral, resp. rate 18, height 6\' 5"  (1.956 m), weight 110.7 kg, SpO2 98 %. Physical Exam  Nursing  note and vitals reviewed. Constitutional: He appears well-developed and well-nourished.  HENT:  Head: Normocephalic and atraumatic.  Eyes: Pupils are equal, round, and reactive to light. Conjunctivae are normal.  Neck: Normal range of motion.  Cardiovascular: Regular rhythm and normal heart sounds.  Respiratory: Effort normal.  GI: Soft.  Musculoskeletal: Normal range of motion.  Neurological: He is alert.  Skin: Skin is warm and dry.  Psychiatric: He has a normal mood and affect. His behavior is normal. Judgment and thought content normal.     Assessment/Plan Continue index treatment  [name], MD 09/02/2018, 10:03 AM

## 2018-09-02 NOTE — Transfer of Care (Signed)
Immediate Anesthesia Transfer of Care Note  Patient: Matthew [name]  Procedure(s) Performed: ECT TX  Patient Location: PACU  Anesthesia Type:General  Level of Consciousness: sedated  Airway & Oxygen Therapy: Patient Spontanous Breathing and Patient connected to face mask oxygen  Post-op Assessment: Report given to RN and Post -op Vital signs reviewed and stable  Post vital signs: Reviewed and stable  Last Vitals:  Vitals Value Taken Time  BP 131/78 09/02/2018 10:48 AM  Temp 36.8 C 09/02/2018 10:48 AM  Pulse 84 09/02/2018 10:49 AM  Resp 14 09/02/2018 10:49 AM  SpO2 100 % 09/02/2018 10:49 AM  Vitals shown include unvalidated device data.  Last Pain:  Vitals:   09/02/18 0845  TempSrc:   PainSc: 0-No pain         Complications: No apparent anesthesia complications

## 2018-09-03 ENCOUNTER — Other Ambulatory Visit: Payer: Self-pay | Admitting: Psychiatry

## 2018-09-04 ENCOUNTER — Encounter: Payer: Self-pay | Admitting: Anesthesiology

## 2018-09-04 ENCOUNTER — Ambulatory Visit
Admission: RE | Admit: 2018-09-04 | Discharge: 2018-09-04 | Disposition: A | Discharge status: Home / Self Care | Payer: 59 | Source: Ambulatory Visit | Attending: Psychiatry | Admitting: Psychiatry

## 2018-09-04 DIAGNOSIS — Z818 Family history of other mental and behavioral disorders: Secondary | ICD-10-CM | POA: Insufficient documentation

## 2018-09-04 DIAGNOSIS — F339 Major depressive disorder, recurrent, unspecified: Secondary | ICD-10-CM | POA: Diagnosis not present

## 2018-09-04 DIAGNOSIS — J449 Chronic obstructive pulmonary disease, unspecified: Secondary | ICD-10-CM | POA: Insufficient documentation

## 2018-09-04 DIAGNOSIS — F419 Anxiety disorder, unspecified: Secondary | ICD-10-CM | POA: Insufficient documentation

## 2018-09-04 DIAGNOSIS — Z881 Allergy status to other antibiotic agents status: Secondary | ICD-10-CM | POA: Insufficient documentation

## 2018-09-04 DIAGNOSIS — F17210 Nicotine dependence, cigarettes, uncomplicated: Secondary | ICD-10-CM | POA: Insufficient documentation

## 2018-09-04 DIAGNOSIS — F418 Other specified anxiety disorders: Secondary | ICD-10-CM | POA: Diagnosis not present

## 2018-09-04 DIAGNOSIS — F332 Major depressive disorder, recurrent severe without psychotic features: Secondary | ICD-10-CM

## 2018-09-04 MED ORDER — KETOROLAC TROMETHAMINE 30 MG/ML IJ SOLN
INTRAMUSCULAR | Status: AC
  Filled 2018-09-04: qty 1

## 2018-09-04 MED ORDER — GLYCOPYRROLATE 0.2 MG/ML IJ SOLN
INTRAMUSCULAR | Status: AC
  Filled 2018-09-04: qty 1

## 2018-09-04 MED ORDER — GLYCOPYRROLATE 0.2 MG/ML IJ SOLN
INTRAMUSCULAR | Status: AC
  Administered 2018-09-04: 0.4 mg via INTRAVENOUS
  Filled 2018-09-04: qty 1

## 2018-09-04 MED ORDER — KETOROLAC TROMETHAMINE 30 MG/ML IJ SOLN
30.0000 mg | Freq: Once | INTRAMUSCULAR | Status: AC
  Administered 2018-09-04: 30 mg via INTRAVENOUS

## 2018-09-04 MED ORDER — GLYCOPYRROLATE 0.2 MG/ML IJ SOLN
0.4000 mg | Freq: Once | INTRAMUSCULAR | Status: AC
  Administered 2018-09-04: 0.4 mg via INTRAVENOUS

## 2018-09-04 MED ORDER — SUCCINYLCHOLINE CHLORIDE 20 MG/ML IJ SOLN
INTRAMUSCULAR | Status: AC
  Filled 2018-09-04: qty 1

## 2018-09-04 MED ORDER — MIDAZOLAM HCL 2 MG/2ML IJ SOLN
2.0000 mg | Freq: Once | INTRAMUSCULAR | Status: AC
  Administered 2018-09-04: 2 mg via INTRAVENOUS

## 2018-09-04 MED ORDER — SUCCINYLCHOLINE CHLORIDE 20 MG/ML IJ SOLN
INTRAMUSCULAR | Status: DC | PRN
  Administered 2018-09-04: 150 mg via INTRAVENOUS

## 2018-09-04 MED ORDER — METHOHEXITAL SODIUM 100 MG/10ML IV SOSY
PREFILLED_SYRINGE | INTRAVENOUS | Status: DC | PRN
  Administered 2018-09-04: 120 mg via INTRAVENOUS

## 2018-09-04 MED ORDER — SODIUM CHLORIDE 0.9 % IV SOLN
INTRAVENOUS | Status: DC | PRN
  Administered 2018-09-04: 11:00:00 via INTRAVENOUS

## 2018-09-04 MED ORDER — MIDAZOLAM HCL 2 MG/2ML IJ SOLN
INTRAMUSCULAR | Status: AC
  Filled 2018-09-04: qty 2

## 2018-09-04 MED ORDER — SODIUM CHLORIDE 0.9 % IV SOLN
500.0000 mL | Freq: Once | INTRAVENOUS | Status: AC
  Administered 2018-09-04: 500 mL via INTRAVENOUS

## 2018-09-04 NOTE — Anesthesia Preprocedure Evaluation (Signed)
Anesthesia Evaluation  Patient identified by MRN, date of birth, ID band Patient awake    Reviewed: Allergy & Precautions, H&P , NPO status , Patient's Chart, lab work & pertinent test results, reviewed documented beta blocker date and time   History of Anesthesia Complications Negative for: history of anesthetic complications  Airway Mallampati: II   Neck ROM: full    Dental  (+) Poor Dentition   Pulmonary neg shortness of breath, asthma , COPD, Current Smoker,    Pulmonary exam normal        Cardiovascular Exercise Tolerance: Poor Normal cardiovascular exam Rhythm:regular Rate:Normal     Neuro/Psych PSYCHIATRIC DISORDERS Anxiety Depression Bipolar Disorder  Neuromuscular disease    GI/Hepatic negative GI ROS, Neg liver ROS,   Endo/Other  negative endocrine ROS  Renal/GU Renal disease  negative genitourinary   Musculoskeletal   Abdominal   Peds  Hematology negative hematology ROS (+)   Anesthesia Other Findings Past Medical History: 01/2012: Acute kidney failure (HCC) No date: Anxiety No date: Bipolar disorder (HCC)     Comment:  hypomania No date: COPD (chronic obstructive pulmonary disease) (HCC) No date: Depression No date: Low back pain Past Surgical History: No date: NO PAST SURGERIES BMI    Body Mass Index:  28.46 kg/m     Reproductive/Obstetrics negative OB ROS                             Anesthesia Physical  Anesthesia Plan  ASA: III  Anesthesia Plan: General   Post-op Pain Management:    Induction: Intravenous  PONV Risk Score and Plan:   Airway Management Planned: Mask  Additional Equipment:   Intra-op Plan:   Post-operative Plan:   Informed Consent: I have reviewed the patients History and Physical, chart, labs and discussed the procedure including the risks, benefits and alternatives for the proposed anesthesia with the patient or authorized  representative who has indicated his/her understanding and acceptance.     Dental Advisory Given  Plan Discussed with: CRNA  Anesthesia Plan Comments: (Patient consented for risks of anesthesia including but not limited to:  - adverse reactions to medications - risk of intubation if required - damage to teeth, lips or other oral mucosa - sore throat or hoarseness - Damage to heart, brain, lungs or loss of life  Patient voiced understanding.)        Anesthesia Quick Evaluation  

## 2018-09-04 NOTE — H&P (Signed)
Matthew [name] is an 48 y.o. male.   Chief Complaint: Patient reports that he is feeling calmer.  No other specific new complaint today. HPI: History of recurrent severe depression currently getting first course of ECT  Past Medical History:  Diagnosis Date  . Acute kidney failure (HCC) 01/2012  . Anxiety   . Bipolar disorder (HCC)    hypomania  . COPD (chronic obstructive pulmonary disease) (HCC)   . Depression   . Low back pain     Past Surgical History:  Procedure Laterality Date  . NO PAST SURGERIES      Family History  Problem Relation Age of Onset  . Hypertension Other   . Anxiety disorder Mother   . Alcohol abuse Father   . Drug abuse Father   . Bipolar disorder Sister   . Alcohol abuse Brother   . Drug abuse Brother   . Bipolar disorder Paternal Aunt   . Schizophrenia Paternal Aunt   . Cancer Neg Hx   . Diabetes Neg Hx   . Heart disease Neg Hx   . Hyperlipidemia Neg Hx   . Stroke Neg Hx   . Suicidality Neg Hx    Social History:  reports that he has been smoking cigarettes. He has a 20.00 pack-year smoking history. He has never used smokeless tobacco. He reports that he does not drink alcohol or use drugs.  Allergies:  Allergies  Allergen Reactions  . Ciprofloxacin     Acute kidney failure    (Not in a hospital admission)   No results found for this or any previous visit (from the past 48 hour(s)). No results found.  Review of Systems  Constitutional: Negative.   HENT: Negative.   Eyes: Negative.   Respiratory: Negative.   Cardiovascular: Negative.   Gastrointestinal: Negative.   Musculoskeletal: Negative.   Skin: Negative.   Neurological: Negative.   Psychiatric/Behavioral: Positive for memory loss. Negative for depression, hallucinations, substance abuse and suicidal ideas. The patient is not nervous/anxious and does not have insomnia.     Blood pressure 126/87, pulse 77, temperature 98.8 F (37.1 C), temperature source Oral, SpO2 98  %. Physical Exam  Nursing note and vitals reviewed. Constitutional: He appears well-developed and well-nourished.  HENT:  Head: Normocephalic and atraumatic.  Eyes: Pupils are equal, round, and reactive to light. Conjunctivae are normal.  Neck: Normal range of motion.  Cardiovascular: Regular rhythm and normal heart sounds.  Respiratory: Effort normal.  GI: Soft.  Musculoskeletal: Normal range of motion.  Neurological: He is alert.  Skin: Skin is warm and dry.  Psychiatric: He has a normal mood and affect. His behavior is normal. Judgment and thought content normal.     Assessment/Plan Today's treatment #3 we will schedule him for next week with ongoing assessment of improvement  [name], MD 09/04/2018, 10:49 AM

## 2018-09-04 NOTE — Discharge Instructions (Signed)
1)  The drugs that you have been given will stay in your system until tomorrow so for the       next 24 hours you should not:  A. Drive an automobile  B. Make any legal decisions  C. Drink any alcoholic beverages  2)  You may resume your regular meals upon return home.  3)  A responsible adult must take you home.  Someone should stay with you for a few          hours, then be available by phone for the remainder of the treatment day.  4)  You May experience any of the following symptoms:  Headache, Nausea and a dry mouth (due to the medications you were given),  temporary memory loss and some confusion, or sore muscles (a warm bath  should help this).  If you you experience any of these symptoms let us know on                your return visit.  5)  Report any of the following: any acute discomfort, severe headache, or temperature        greater than 100.5 F.   Also report any unusual redness, swelling, drainage, or pain         at your IV site.    You may report Symptoms to:  ECT PROGRAM- [place] [facility]          Phone: [phone], ECT Department           or Dr. Shary Key office [phone]  6)  Your next ECT Treatment is Day Monday  Date September 07, 2018 at 0830  We will call 2 days prior to your scheduled appointment for arrival times.  7)  Nothing to eat or drink after midnight the night before your procedure.  8)  Take .  With a sip of water the morning of your procedure.  9)  Other Instructions: Call [phone] to cancel the morning of your procedure due         to illness or emergency.  10) We will call within 72 hours to assess how you are feeling.

## 2018-09-04 NOTE — Anesthesia Post-op Follow-up Note (Signed)
Anesthesia [facility] form completed.        

## 2018-09-04 NOTE — Procedures (Signed)
ECT SERVICES Physician's Interval Evaluation & Treatment Note  Patient Identification: Matthew DarbyRobert L Brogden [name] MRN:  [id number] Date of Evaluation:  09/04/2018 TX #: 3  MADRS:   MMSE:   P.E. Findings:  No change to physical exam  Psychiatric Interval Note:  Mood is reported as feeling calm her a little bit better  Subjective:  Patient is a 48 y.o. male seen for evaluation for Electroconvulsive Therapy. No specific complaints  Treatment Summary:   [x]   Right Unilateral             []  Bilateral   % Energy : 0.3 ms 50%   Impedance: 1150 ohms  Seizure Energy Index: 5878 V squared  Postictal Suppression Index: 90%  Seizure Concordance Index: 86%  Medications  Pre Shock: Robinul 0.2 mg Toradol 30 mg Brevital 120 mg succinylcholine 150 mg  Post Shock: Versed 2 mg  Seizure Duration: 42 seconds EMG 67 seconds EEG   Comments: Follow-up on Monday  Lungs:  [x]   Clear to auscultation               []  Other:   Heart:    [x]   Regular rhythm             []  irregular rhythm    [x]   Previous H&P reviewed, patient examined and there are NO CHANGES                 []   Previous H&P reviewed, patient examined and there are changes noted.   Matthew RasmussenJohn Clapacs, MD 1/17/202010:51 AM

## 2018-09-04 NOTE — Anesthesia Procedure Notes (Signed)
Date/Time: 09/04/2018 10:56 AM Performed by: Omer Jack, CRNA Pre-anesthesia Checklist: Patient identified, Emergency Drugs available, Suction available and Patient being monitored Patient Re-evaluated:Patient Re-evaluated prior to induction Oxygen Delivery Method: Circle system utilized Preoxygenation: Pre-oxygenation with 100% oxygen Induction Type: IV induction Ventilation: Mask ventilation without difficulty and Mask ventilation throughout procedure Airway Equipment and Method: Bite block Placement Confirmation: positive ETCO2 Dental Injury: Teeth and Oropharynx as per pre-operative assessment

## 2018-09-04 NOTE — Transfer of Care (Signed)
Immediate Anesthesia Transfer of Care Note  Patient: Matthew [name]  Procedure(s) Performed: ECT TX  Patient Location: PACU  Anesthesia Type:General  Level of Consciousness: drowsy and patient cooperative  Airway & Oxygen Therapy: Patient Spontanous Breathing and Patient connected to face mask oxygen  Post-op Assessment: Report given to RN and Post -op Vital signs reviewed and stable  Post vital signs: Reviewed and stable  Last Vitals:  Vitals Value Taken Time  BP 177/99 09/04/2018 11:06 AM  Temp    Pulse 83 09/04/2018 11:06 AM  Resp 14 09/04/2018 11:06 AM  SpO2 98 % 09/04/2018 11:06 AM  Vitals shown include unvalidated device data.  Last Pain:  Vitals:   09/04/18 0841  TempSrc: Oral  PainSc:          Complications: No apparent anesthesia complications

## 2018-09-07 ENCOUNTER — Encounter: Payer: Self-pay | Admitting: Anesthesiology

## 2018-09-07 ENCOUNTER — Other Ambulatory Visit: Payer: Self-pay | Admitting: Psychiatry

## 2018-09-07 ENCOUNTER — Ambulatory Visit
Admission: RE | Admit: 2018-09-07 | Discharge: 2018-09-07 | Disposition: A | Discharge status: Home / Self Care | Payer: 59 | Source: Ambulatory Visit | Attending: Family Medicine | Admitting: Family Medicine

## 2018-09-07 DIAGNOSIS — J449 Chronic obstructive pulmonary disease, unspecified: Secondary | ICD-10-CM | POA: Diagnosis not present

## 2018-09-07 DIAGNOSIS — F17210 Nicotine dependence, cigarettes, uncomplicated: Secondary | ICD-10-CM | POA: Insufficient documentation

## 2018-09-07 DIAGNOSIS — Z881 Allergy status to other antibiotic agents status: Secondary | ICD-10-CM | POA: Diagnosis not present

## 2018-09-07 DIAGNOSIS — F332 Major depressive disorder, recurrent severe without psychotic features: Secondary | ICD-10-CM | POA: Insufficient documentation

## 2018-09-07 DIAGNOSIS — F3181 Bipolar II disorder: Secondary | ICD-10-CM | POA: Diagnosis not present

## 2018-09-07 DIAGNOSIS — F419 Anxiety disorder, unspecified: Secondary | ICD-10-CM | POA: Insufficient documentation

## 2018-09-07 MED ORDER — KETOROLAC TROMETHAMINE 30 MG/ML IJ SOLN
30.0000 mg | Freq: Once | INTRAMUSCULAR | Status: AC
  Administered 2018-09-07: 30 mg via INTRAVENOUS

## 2018-09-07 MED ORDER — SUCCINYLCHOLINE CHLORIDE 20 MG/ML IJ SOLN
INTRAMUSCULAR | Status: AC
  Filled 2018-09-07: qty 1

## 2018-09-07 MED ORDER — GLYCOPYRROLATE 0.2 MG/ML IJ SOLN
INTRAMUSCULAR | Status: AC
  Administered 2018-09-07: 0.3 mg via INTRAVENOUS
  Filled 2018-09-07: qty 2

## 2018-09-07 MED ORDER — MIDAZOLAM HCL 2 MG/2ML IJ SOLN
INTRAMUSCULAR | Status: DC | PRN
  Administered 2018-09-07: 2 mg via INTRAVENOUS

## 2018-09-07 MED ORDER — METHOHEXITAL SODIUM 100 MG/10ML IV SOSY
PREFILLED_SYRINGE | INTRAVENOUS | Status: DC | PRN
  Administered 2018-09-07: 120 mg via INTRAVENOUS

## 2018-09-07 MED ORDER — SUCCINYLCHOLINE CHLORIDE 200 MG/10ML IV SOSY
PREFILLED_SYRINGE | INTRAVENOUS | Status: DC | PRN
  Administered 2018-09-07: 150 mg via INTRAVENOUS

## 2018-09-07 MED ORDER — SODIUM CHLORIDE 0.9 % IV SOLN
500.0000 mL | Freq: Once | INTRAVENOUS | Status: AC
  Administered 2018-09-07: 500 mL via INTRAVENOUS

## 2018-09-07 MED ORDER — ONDANSETRON HCL 4 MG/2ML IJ SOLN: 4.0000 mg | Freq: Once | INTRAMUSCULAR | Status: DC | PRN

## 2018-09-07 MED ORDER — FENTANYL CITRATE (PF) 100 MCG/2ML IJ SOLN: 25.0000 ug | INTRAMUSCULAR | Status: DC | PRN

## 2018-09-07 MED ORDER — SODIUM CHLORIDE 0.9 % IV SOLN
INTRAVENOUS | Status: DC | PRN
  Administered 2018-09-07: 09:00:00 via INTRAVENOUS

## 2018-09-07 MED ORDER — KETOROLAC TROMETHAMINE 30 MG/ML IJ SOLN
INTRAMUSCULAR | Status: AC
  Filled 2018-09-07: qty 1

## 2018-09-07 MED ORDER — MIDAZOLAM HCL 2 MG/2ML IJ SOLN
INTRAMUSCULAR | Status: AC
  Filled 2018-09-07: qty 2

## 2018-09-07 MED ORDER — METHOHEXITAL SODIUM 0.5 G IJ SOLR
INTRAMUSCULAR | Status: AC
  Filled 2018-09-07: qty 500

## 2018-09-07 MED ORDER — GLYCOPYRROLATE 0.2 MG/ML IJ SOLN
0.3000 mg | Freq: Once | INTRAMUSCULAR | Status: AC
  Administered 2018-09-07: 0.3 mg via INTRAVENOUS

## 2018-09-07 NOTE — H&P (Signed)
Matthew [name] is an 48 y.o. male.   Chief Complaint: Follow-up for patient with depression receiving ECT.  Today will be treatment #4.  Patient reports he is not having suicidal ideation and is feeling more "calm" HPI: History of severe recurrent depression possible bipolar disorder  Past Medical History:  Diagnosis Date  . Acute kidney failure (HCC) 01/2012  . Anxiety   . Bipolar disorder (HCC)    hypomania  . COPD (chronic obstructive pulmonary disease) (HCC)   . Depression   . Low back pain     Past Surgical History:  Procedure Laterality Date  . NO PAST SURGERIES      Family History  Problem Relation Age of Onset  . Hypertension Other   . Anxiety disorder Mother   . Alcohol abuse Father   . Drug abuse Father   . Bipolar disorder Sister   . Alcohol abuse Brother   . Drug abuse Brother   . Bipolar disorder Paternal Aunt   . Schizophrenia Paternal Aunt   . Cancer Neg Hx   . Diabetes Neg Hx   . Heart disease Neg Hx   . Hyperlipidemia Neg Hx   . Stroke Neg Hx   . Suicidality Neg Hx    Social History:  reports that he has been smoking cigarettes. He has a 20.00 pack-year smoking history. He has never used smokeless tobacco. He reports that he does not drink alcohol or use drugs.  Allergies:  Allergies  Allergen Reactions  . Ciprofloxacin     Acute kidney failure    (Not in a hospital admission)   No results found for this or any previous visit (from the past 48 hour(s)). No results found.  Review of Systems  Constitutional: Negative.   HENT: Negative.   Eyes: Negative.   Respiratory: Negative.   Cardiovascular: Negative.   Gastrointestinal: Negative.   Musculoskeletal: Negative.   Skin: Negative.   Neurological: Positive for headaches.  Psychiatric/Behavioral: Positive for memory loss. Negative for depression, hallucinations, substance abuse and suicidal ideas. The patient is not nervous/anxious and does not have insomnia.     Blood pressure  126/83, pulse 88, temperature 97.8 F (36.6 C), temperature source Oral, resp. rate 18, height 6\' 5"  (1.956 m), weight 107.5 kg, SpO2 96 %. Physical Exam  Nursing note and vitals reviewed. Constitutional: He appears well-developed and well-nourished.  HENT:  Head: Normocephalic and atraumatic.  Eyes: Pupils are equal, round, and reactive to light. Conjunctivae are normal.  Neck: Normal range of motion.  Cardiovascular: Normal heart sounds.  Respiratory: Effort normal.  GI: Soft.  Musculoskeletal: Normal range of motion.  Neurological: He is alert.  Skin: Skin is warm and dry.  Psychiatric: He has a normal mood and affect. His behavior is normal. Judgment and thought content normal.     Assessment/Plan Patient is on treatment #4 and seems to be showing some improvement with minimal side effects.  Spoke with him and reviewed the plan for continued treatment this week.  [name], MD 09/07/2018, 9:38 AM

## 2018-09-07 NOTE — Discharge Instructions (Signed)
1)  The drugs that you have been given will stay in your system until tomorrow so for the       next 24 hours you should not:  A. Drive an automobile  B. Make any legal decisions  C. Drink any alcoholic beverages  2)  You may resume your regular meals upon return home.  3)  A responsible adult must take you home.  Someone should stay with you for a few          hours, then be available by phone for the remainder of the treatment day.  4)  You May experience any of the following symptoms:  Headache, Nausea and a dry mouth (due to the medications you were given),  temporary memory loss and some confusion, or sore muscles (a warm bath  should help this).  If you you experience any of these symptoms let us know on                your return visit.  5)  Report any of the following: any acute discomfort, severe headache, or temperature        greater than 100.5 F.   Also report any unusual redness, swelling, drainage, or pain         at your IV site.    You may report Symptoms to:  ECT PROGRAM- [place] at [facility]          Phone: [phone], ECT Department           or Dr. Shary Key office [phone]  6)  Your next ECT Treatment is Day Wednesday  Date September 09, 2018 at 0830  We will call 2 days prior to your scheduled appointment for arrival times.  7)  Nothing to eat or drink after midnight the night before your procedure.  8)  Take .     With a sip of water the morning of your procedure.  9)  Other Instructions: Call [phone] to cancel the morning of your procedure due         to illness or emergency.  10) We will call within 72 hours to assess how you are feeling.

## 2018-09-07 NOTE — Anesthesia Procedure Notes (Addendum)
Procedure Name: LMA Insertion Date/Time: 09/07/2018 10:15 AM Performed by: Lily Kocher, CRNA Pre-anesthesia Checklist: Patient identified, Patient being monitored, Timeout performed, Emergency Drugs available and Suction available Patient Re-evaluated:Patient Re-evaluated prior to induction Oxygen Delivery Method: Circle system utilized Preoxygenation: Pre-oxygenation with 100% oxygen Induction Type: IV induction Ventilation: Mask ventilation with difficulty LMA: LMA inserted LMA Size: 4.0 Tube type: Oral Number of attempts: 1 Placement Confirmation: positive ETCO2 and breath sounds checked- equal and bilateral Tube secured with: Tape Dental Injury: Teeth and Oropharynx as per pre-operative assessment

## 2018-09-07 NOTE — Procedures (Signed)
ECT SERVICES Physician's Interval Evaluation & Treatment Note  Patient Identification: Matthew [name] MRN:  [id number] Date of Evaluation:  09/07/2018 TX #: 4  MADRS: 22  MMSE: 30  P.E. Findings:  No change to physical exam  Psychiatric Interval Note: Subjectively feels calmer  Exam  Subjective:  Patient is a 48 y.o. male seen for evaluation for Electroconvulsive Therapy. Calmer somewhat less depressed  Treatment Summary:   [x]   Right Unilateral             []  Bilateral   % Energy : 0.3 ms 50%   Impedance: 1000 ohms  Seizure Energy Index: 4033 V squared  Postictal Suppression Index: 62%  Seizure Concordance Index: 93%  Medications  Pre Shock: Robinul 0.3 mg Toradol 30 mg Brevital 120 mg succinylcholine 150 mg  Post Shock: Versed 2 mg  Seizure Duration: EMG 14 seconds EEG 65 seconds   Comments: Follow-up Wednesday  Lungs:  [x]   Clear to auscultation               []  Other:   Heart:    [x]   Regular rhythm             []  irregular rhythm    [x]   Previous H&P reviewed, patient examined and there are NO CHANGES                 []   Previous H&P reviewed, patient examined and there are changes noted.   [name], MD 1/20/20209:39 AM

## 2018-09-07 NOTE — Transfer of Care (Signed)
Immediate Anesthesia Transfer of Care Note  Patient: Matthew [name]  Procedure(s) Performed: ECT TX  Patient Location: PACU  Anesthesia Type:General  Level of Consciousness: sedated  Airway & Oxygen Therapy: Patient Spontanous Breathing and Patient connected to face mask oxygen  Post-op Assessment: Report given to RN and Post -op Vital signs reviewed and stable  Post vital signs: Reviewed and stable  Last Vitals:  Vitals Value Taken Time  BP 131/82 09/07/2018 10:27 AM  Temp 36.7 C 09/07/2018 10:27 AM  Pulse 87 09/07/2018 10:29 AM  Resp 12 09/07/2018 10:29 AM  SpO2 100 % 09/07/2018 10:29 AM  Vitals shown include unvalidated device data.  Last Pain:  Vitals:   09/07/18 1027  TempSrc:   PainSc: Asleep         Complications: No apparent anesthesia complications

## 2018-09-07 NOTE — Anesthesia Postprocedure Evaluation (Signed)
Anesthesia Post Note  Patient: Matthew [name]  Procedure(s) Performed: ECT TX  Patient location during evaluation: PACU Anesthesia Type: General Level of consciousness: awake and alert and oriented Pain management: pain level controlled Vital Signs Assessment: post-procedure vital signs reviewed and stable Respiratory status: spontaneous breathing Cardiovascular status: blood pressure returned to baseline Anesthetic complications: no     Last Vitals:  Vitals:   09/07/18 1053 09/07/18 1100  BP: 113/64 (!) 136/91  Pulse: 76 68  Resp: 16 (!) 68  Temp: 36.6 C 36.6 C  SpO2: 98%     Last Pain:  Vitals:   09/07/18 1100  TempSrc: Oral  PainSc: 0-No pain                 Elya Tarquinio

## 2018-09-07 NOTE — Anesthesia Postprocedure Evaluation (Signed)
Anesthesia Post Note  Patient: Matthew [name]  Procedure(s) Performed: ECT TX  Patient location during evaluation: PACU Anesthesia Type: General Level of consciousness: awake and alert Pain management: pain level controlled Vital Signs Assessment: post-procedure vital signs reviewed and stable Respiratory status: spontaneous breathing, nonlabored ventilation, respiratory function stable and patient connected to nasal cannula oxygen Cardiovascular status: blood pressure returned to baseline and stable Postop Assessment: no apparent nausea or vomiting Anesthetic complications: no     Last Vitals:  Vitals:   09/04/18 1125 09/04/18 1141  BP: (!) 120/91 123/87  Pulse: 67 67  Resp: 15 16  Temp: 36.9 C 36.9 C  SpO2: 96%     Last Pain:  Vitals:   09/04/18 1141  TempSrc: Oral  PainSc:                  Lenard Simmer

## 2018-09-07 NOTE — Anesthesia Post-op Follow-up Note (Signed)
Anesthesia [facility] form completed.        

## 2018-09-07 NOTE — Anesthesia Preprocedure Evaluation (Signed)
Anesthesia Evaluation  Patient identified by MRN, date of birth, ID band Patient awake    Reviewed: Allergy & Precautions, H&P , NPO status , Patient's Chart, lab work & pertinent test results, reviewed documented beta blocker date and time   History of Anesthesia Complications Negative for: history of anesthetic complications  Airway Mallampati: II   Neck ROM: full    Dental  (+) Poor Dentition   Pulmonary neg shortness of breath, asthma , COPD, Current Smoker,    Pulmonary exam normal        Cardiovascular Exercise Tolerance: Poor Normal cardiovascular exam Rhythm:regular Rate:Normal     Neuro/Psych PSYCHIATRIC DISORDERS Anxiety Depression Bipolar Disorder  Neuromuscular disease    GI/Hepatic negative GI ROS, Neg liver ROS,   Endo/Other  negative endocrine ROS  Renal/GU Renal disease  negative genitourinary   Musculoskeletal   Abdominal   Peds  Hematology negative hematology ROS (+)   Anesthesia Other Findings Past Medical History: 01/2012: Acute kidney failure (HCC) No date: Anxiety No date: Bipolar disorder (HCC)     Comment:  hypomania No date: COPD (chronic obstructive pulmonary disease) (HCC) No date: Depression No date: Low back pain Past Surgical History: No date: NO PAST SURGERIES BMI    Body Mass Index:  28.46 kg/m     Reproductive/Obstetrics negative OB ROS                             Anesthesia Physical  Anesthesia Plan  ASA: III  Anesthesia Plan: General   Post-op Pain Management:    Induction: Intravenous  PONV Risk Score and Plan:   Airway Management Planned: Mask  Additional Equipment:   Intra-op Plan:   Post-operative Plan:   Informed Consent: I have reviewed the patients History and Physical, chart, labs and discussed the procedure including the risks, benefits and alternatives for the proposed anesthesia with the patient or authorized  representative who has indicated his/her understanding and acceptance.     Dental Advisory Given  Plan Discussed with: CRNA  Anesthesia Plan Comments: (Patient consented for risks of anesthesia including but not limited to:  - adverse reactions to medications - risk of intubation if required - damage to teeth, lips or other oral mucosa - sore throat or hoarseness - Damage to heart, brain, lungs or loss of life  Patient voiced understanding.)        Anesthesia Quick Evaluation

## 2018-09-08 ENCOUNTER — Other Ambulatory Visit: Payer: Self-pay | Admitting: Psychiatry

## 2018-09-09 ENCOUNTER — Encounter (HOSPITAL_COMMUNITY)
Admission: RE | Admit: 2018-09-09 | Discharge: 2018-09-09 | Disposition: A | Discharge status: Home / Self Care | Payer: 59 | Source: Ambulatory Visit | Attending: Psychiatry | Admitting: Psychiatry

## 2018-09-09 ENCOUNTER — Encounter: Payer: Self-pay | Admitting: Anesthesiology

## 2018-09-09 DIAGNOSIS — F17210 Nicotine dependence, cigarettes, uncomplicated: Secondary | ICD-10-CM | POA: Diagnosis not present

## 2018-09-09 DIAGNOSIS — F332 Major depressive disorder, recurrent severe without psychotic features: Secondary | ICD-10-CM

## 2018-09-09 DIAGNOSIS — F319 Bipolar disorder, unspecified: Secondary | ICD-10-CM | POA: Diagnosis not present

## 2018-09-09 DIAGNOSIS — Z818 Family history of other mental and behavioral disorders: Secondary | ICD-10-CM | POA: Diagnosis not present

## 2018-09-09 DIAGNOSIS — J449 Chronic obstructive pulmonary disease, unspecified: Secondary | ICD-10-CM | POA: Diagnosis not present

## 2018-09-09 DIAGNOSIS — F419 Anxiety disorder, unspecified: Secondary | ICD-10-CM | POA: Diagnosis not present

## 2018-09-09 DIAGNOSIS — Z881 Allergy status to other antibiotic agents status: Secondary | ICD-10-CM | POA: Diagnosis not present

## 2018-09-09 MED ORDER — METHOHEXITAL SODIUM 100 MG/10ML IV SOSY
PREFILLED_SYRINGE | INTRAVENOUS | Status: DC | PRN
  Administered 2018-09-09: 120 mg via INTRAVENOUS

## 2018-09-09 MED ORDER — SUCCINYLCHOLINE CHLORIDE 20 MG/ML IJ SOLN
INTRAMUSCULAR | Status: AC
  Filled 2018-09-09: qty 1

## 2018-09-09 MED ORDER — SODIUM CHLORIDE 0.9 % IV SOLN
INTRAVENOUS | Status: DC | PRN
  Administered 2018-09-09: 10:00:00 via INTRAVENOUS

## 2018-09-09 MED ORDER — SODIUM CHLORIDE 0.9 % IV SOLN
500.0000 mL | Freq: Once | INTRAVENOUS | Status: AC
  Administered 2018-09-09: 500 mL via INTRAVENOUS

## 2018-09-09 MED ORDER — GLYCOPYRROLATE 0.2 MG/ML IJ SOLN
INTRAMUSCULAR | Status: AC
  Administered 2018-09-09: 0.3 mg via INTRAVENOUS
  Filled 2018-09-09: qty 2

## 2018-09-09 MED ORDER — KETOROLAC TROMETHAMINE 30 MG/ML IJ SOLN
30.0000 mg | Freq: Once | INTRAMUSCULAR | Status: AC
  Administered 2018-09-09: 30 mg via INTRAVENOUS

## 2018-09-09 MED ORDER — GLYCOPYRROLATE 0.2 MG/ML IJ SOLN
0.3000 mg | Freq: Once | INTRAMUSCULAR | Status: AC
  Administered 2018-09-09: 0.3 mg via INTRAVENOUS

## 2018-09-09 MED ORDER — MIDAZOLAM HCL 2 MG/2ML IJ SOLN
INTRAMUSCULAR | Status: DC | PRN
  Administered 2018-09-09: 2 mg via INTRAVENOUS

## 2018-09-09 MED ORDER — SUCCINYLCHOLINE CHLORIDE 200 MG/10ML IV SOSY
PREFILLED_SYRINGE | INTRAVENOUS | Status: DC | PRN
  Administered 2018-09-09: 150 mg via INTRAVENOUS

## 2018-09-09 MED ORDER — KETOROLAC TROMETHAMINE 30 MG/ML IJ SOLN
INTRAMUSCULAR | Status: AC
  Administered 2018-09-09: 30 mg via INTRAVENOUS
  Filled 2018-09-09: qty 1

## 2018-09-09 MED ORDER — MIDAZOLAM HCL 2 MG/2ML IJ SOLN: 2.0000 mg | Freq: Once | INTRAMUSCULAR | Status: DC

## 2018-09-09 MED ORDER — MIDAZOLAM HCL 2 MG/2ML IJ SOLN
INTRAMUSCULAR | Status: AC
  Filled 2018-09-09: qty 2

## 2018-09-09 MED ORDER — METHOHEXITAL SODIUM 0.5 G IJ SOLR
INTRAMUSCULAR | Status: AC
  Filled 2018-09-09: qty 500

## 2018-09-09 NOTE — Anesthesia Post-op Follow-up Note (Signed)
Anesthesia [facility] form completed.        

## 2018-09-09 NOTE — Anesthesia Postprocedure Evaluation (Signed)
Anesthesia Post Note  Patient: Matthew [name]  Procedure(s) Performed: ECT TX  Patient location during evaluation: PACU Anesthesia Type: General Level of consciousness: awake and alert Pain management: pain level controlled Vital Signs Assessment: post-procedure vital signs reviewed and stable Respiratory status: spontaneous breathing, nonlabored ventilation and respiratory function stable Cardiovascular status: blood pressure returned to baseline and stable Postop Assessment: no signs of nausea or vomiting Anesthetic complications: no     Last Vitals:  Vitals:   09/09/18 1120 09/09/18 1130  BP: 133/88 135/88  Pulse: 97 96  Resp: 18 18  Temp:    SpO2: 99% 98%    Last Pain:  Vitals:   09/09/18 1136  TempSrc:   PainSc: 5                  Cleva Camero

## 2018-09-09 NOTE — Anesthesia Procedure Notes (Signed)
Procedure Name: General with mask airway Date/Time: 09/09/2018 11:00 AM Performed by: Lily Kocher, CRNA Pre-anesthesia Checklist: Patient identified, Emergency Drugs available, Suction available and Patient being monitored Patient Re-evaluated:Patient Re-evaluated prior to induction Oxygen Delivery Method: Circle system utilized Preoxygenation: Pre-oxygenation with 100% oxygen Ventilation: Mask ventilation without difficulty and Oral airway inserted - appropriate to patient size Airway Equipment and Method: Bite block and Oral airway Placement Confirmation: positive ETCO2 and breath sounds checked- equal and bilateral

## 2018-09-09 NOTE — Procedures (Signed)
ECT SERVICES Physician's Interval Evaluation & Treatment Note  Patient Identification: Rachael DarbyRobert L Gwaltney III MRN:  [id number] Date of Evaluation:  09/09/2018 TX #: 5  MADRS:   MMSE:   P.E. Findings:  No change to physical  Psychiatric Interval Note:  Affect calmer mood reported as better  Subjective:  Patient is a 48 y.o. male seen for evaluation for Electroconvulsive Therapy. No report of any side effects as his mood is much better  Treatment Summary:   [x]   Right Unilateral             []  Bilateral   % Energy : 0.3 ms 50%   Impedance: 2720 ohms  Seizure Energy Index: 4098 V squared  Postictal Suppression Index: 22%  Seizure Concordance Index: 74%  Medications  Pre Shock: Robinul 0.3 mg Toradol 30 mg Brevital 120 mg succinylcholine 150 mg  Post Shock: Versed 2 mg  Seizure Duration: 33 seconds EMG 64 seconds EEG   Comments: Follow-up Friday  Lungs:  [x]   Clear to auscultation               []  Other:   Heart:    [x]   Regular rhythm             []  irregular rhythm    [x]   Previous H&P reviewed, patient examined and there are NO CHANGES                 []   Previous H&P reviewed, patient examined and there are changes noted.   Mordecai RasmussenJohn Clapacs, MD 1/22/202010:54 AM

## 2018-09-09 NOTE — Transfer of Care (Signed)
Immediate Anesthesia Transfer of Care Note  Patient: Matthew [name]  Procedure(s) Performed: ECT TX  Patient Location: PACU  Anesthesia Type:General  Level of Consciousness: sedated  Airway & Oxygen Therapy: Patient Spontanous Breathing and Patient connected to face mask oxygen  Post-op Assessment: Report given to RN and Post -op Vital signs reviewed and stable  Post vital signs: Reviewed and stable  Last Vitals:  Vitals Value Taken Time  BP 132/95 09/09/2018 11:10 AM  Temp    Pulse 97 09/09/2018 11:10 AM  Resp 18 09/09/2018 11:10 AM  SpO2 99 % 09/09/2018 11:10 AM    Last Pain:  Vitals:   09/09/18 1110  TempSrc:   PainSc: Asleep         Complications: No apparent anesthesia complications

## 2018-09-09 NOTE — Discharge Instructions (Signed)
1)  The drugs that you have been given will stay in your system until tomorrow so for the       next 24 hours you should not:  A. Drive an automobile  B. Make any legal decisions  C. Drink any alcoholic beverages  2)  You may resume your regular meals upon return home.  3)  A responsible adult must take you home.  Someone should stay with you for a few          hours, then be available by phone for the remainder of the treatment day.  4)  You May experience any of the following symptoms:  Headache, Nausea and a dry mouth (due to the medications you were given),  temporary memory loss and some confusion, or sore muscles (a warm bath  should help this).  If you you experience any of these symptoms let us know on                your return visit.  5)  Report any of the following: any acute discomfort, severe headache, or temperature        greater than 100.5 F.   Also report any unusual redness, swelling, drainage, or pain         at your IV site.    You may report Symptoms to:  ECT PROGRAM- [place] [facility]          Phone: [phone], ECT Department           or Dr. Shary Key office [phone]  6)  Your next ECT Treatment is Friday January 24   We will call 2 days prior to your scheduled appointment for arrival times.  7)  Nothing to eat or drink after midnight the night before your procedure.  8)  Take     With a sip of water the morning of your procedure.  9)  Other Instructions: Call [phone] to cancel the morning of your procedure due         to illness or emergency.  10) We will call within 72 hours to assess how you are feeling.

## 2018-09-09 NOTE — H&P (Signed)
Matthew [name] is an 48 y.o. male.   Chief Complaint: Patient reports that his mood has been improved.  Not reporting current suicidal or psychotic symptoms. HPI: History of recurrent severe depression possible bipolar disorder showing some response at this point ECT.  Past Medical History:  Diagnosis Date  . Acute kidney failure (HCC) 01/2012  . Anxiety   . Bipolar disorder (HCC)    hypomania  . COPD (chronic obstructive pulmonary disease) (HCC)   . Depression   . Low back pain     Past Surgical History:  Procedure Laterality Date  . NO PAST SURGERIES      Family History  Problem Relation Age of Onset  . Hypertension Other   . Anxiety disorder Mother   . Alcohol abuse Father   . Drug abuse Father   . Bipolar disorder Sister   . Alcohol abuse Brother   . Drug abuse Brother   . Bipolar disorder Paternal Aunt   . Schizophrenia Paternal Aunt   . Cancer Neg Hx   . Diabetes Neg Hx   . Heart disease Neg Hx   . Hyperlipidemia Neg Hx   . Stroke Neg Hx   . Suicidality Neg Hx    Social History:  reports that he has been smoking cigarettes. He has a 20.00 pack-year smoking history. He has never used smokeless tobacco. He reports that he does not drink alcohol or use drugs.  Allergies:  Allergies  Allergen Reactions  . Ciprofloxacin     Acute kidney failure    (Not in a hospital admission)   No results found for this or any previous visit (from the past 48 hour(s)). No results found.  Review of Systems  Constitutional: Negative.   HENT: Negative.   Eyes: Negative.   Respiratory: Negative.   Cardiovascular: Negative.   Gastrointestinal: Negative.   Musculoskeletal: Negative.   Skin: Negative.   Neurological: Negative.   Psychiatric/Behavioral: Negative.     Blood pressure 125/84, pulse 80, temperature (!) 97.4 F (36.3 C), temperature source Oral, resp. rate 18, SpO2 100 %. Physical Exam  Nursing note and vitals reviewed. Constitutional: He appears  well-developed and well-nourished.  HENT:  Head: Normocephalic and atraumatic.  Eyes: Pupils are equal, round, and reactive to light. Conjunctivae are normal.  Neck: Normal range of motion.  Cardiovascular: Regular rhythm and normal heart sounds.  Respiratory: Effort normal.  GI: Soft.  Musculoskeletal: Normal range of motion.  Neurological: He is alert.  Skin: Skin is warm and dry.  Psychiatric: Judgment normal. His affect is blunt. His speech is delayed. He is slowed. Thought content is not paranoid. Cognition and memory are normal. He expresses no homicidal and no suicidal ideation.     Assessment/Plan Treatment today followed by treatment on Friday and continued day by day assessment  Mordecai RasmussenJohn Bailen Geffre, MD 09/09/2018, 10:52 AM

## 2018-09-09 NOTE — Anesthesia Preprocedure Evaluation (Signed)
Anesthesia Evaluation  Patient identified by MRN, date of birth, ID band Patient awake    Reviewed: Allergy & Precautions, NPO status , Patient's Chart, lab work & pertinent test results  History of Anesthesia Complications Negative for: history of anesthetic complications  Airway Mallampati: III  TM Distance: >3 FB Neck ROM: Full    Dental no notable dental hx.    Pulmonary asthma , neg sleep apnea, COPD,  COPD inhaler, Current Smoker,    breath sounds clear to auscultation- rhonchi (-) wheezing      Cardiovascular Exercise Tolerance: Good (-) hypertension(-) CAD, (-) Past MI, (-) Cardiac Stents and (-) CABG  Rhythm:Regular Rate:Normal - Systolic murmurs and - Diastolic murmurs    Neuro/Psych neg Seizures PSYCHIATRIC DISORDERS Anxiety Depression Bipolar Disorder negative neurological ROS     GI/Hepatic negative GI ROS, Neg liver ROS,   Endo/Other  negative endocrine ROSneg diabetes  Renal/GU negative Renal ROS     Musculoskeletal negative musculoskeletal ROS (+)   Abdominal (+) - obese,   Peds  Hematology negative hematology ROS (+)   Anesthesia Other Findings Past Medical History: 01/2012: Acute kidney failure (HCC) No date: Anxiety No date: Bipolar disorder (HCC)     Comment:  hypomania No date: COPD (chronic obstructive pulmonary disease) (HCC) No date: Depression No date: Low back pain   Reproductive/Obstetrics                             Anesthesia Physical  Anesthesia Plan  ASA: II  Anesthesia Plan: General   Post-op Pain Management:    Induction: Intravenous  PONV Risk Score and Plan: 0 and Ondansetron  Airway Management Planned: Mask  Additional Equipment:   Intra-op Plan:   Post-operative Plan:   Informed Consent: I have reviewed the patients History and Physical, chart, labs and discussed the procedure including the risks, benefits and alternatives for the  proposed anesthesia with the patient or authorized representative who has indicated his/her understanding and acceptance.     Dental advisory given  Plan Discussed with: CRNA and Anesthesiologist  Anesthesia Plan Comments:         Anesthesia Quick Evaluation  

## 2018-09-10 ENCOUNTER — Encounter: Payer: Self-pay | Admitting: Family Medicine

## 2018-09-10 ENCOUNTER — Other Ambulatory Visit: Payer: Self-pay

## 2018-09-10 ENCOUNTER — Ambulatory Visit: Payer: 59 | Admitting: Family Medicine

## 2018-09-10 ENCOUNTER — Other Ambulatory Visit: Payer: Self-pay | Admitting: Psychiatry

## 2018-09-10 VITALS — BP 122/78 | HR 76 | Temp 98.2°F | Resp 16 | Ht 78.0 in | Wt 236.0 lb

## 2018-09-10 DIAGNOSIS — Z720 Tobacco use: Secondary | ICD-10-CM

## 2018-09-10 DIAGNOSIS — J4520 Mild intermittent asthma, uncomplicated: Secondary | ICD-10-CM | POA: Diagnosis not present

## 2018-09-10 DIAGNOSIS — F3181 Bipolar II disorder: Secondary | ICD-10-CM | POA: Diagnosis not present

## 2018-09-10 DIAGNOSIS — T887XXA Unspecified adverse effect of drug or medicament, initial encounter: Secondary | ICD-10-CM

## 2018-09-10 NOTE — Patient Instructions (Signed)
Schedule your complete physical in 6 months No need for labs today- yay! Start the [facility] paperwork process to make sure you're protected If you have any questions or concerns- please call and we'll help in any way! Welcome!  We're glad to have you!!!

## 2018-09-10 NOTE — Progress Notes (Signed)
   Subjective:    Patient ID: Matthew [name], male    DOB: [birth year], 48 y.o.   MRN: [id number]  HPI New to establish.  Previous MD- Yetta Barre Ninfa Meeker)  Psychiatrist- Michae Kava  Medication side effect- pt reports diarrhea and bowel issues since starting Lithium.  Lithium is currently on hold while getting ECT and bowels have improved.  Bipolar- pt has had 5 ECT treatments, on M/W/Fs.  Reports feeling good w/ exception of 'horrible headache' the day of tx.  Pt notes improvement in mood since starting txs.  Pt has been able to cut back on Ativan while having his treatments.  Asthma- pt is not on any controller medications.  Tends to flare w/ URIs.  Will use Albuterol PRN.  Continues to smoke.  Pt is smoking 1-1.5 packs/day.  Smoking ~27 yrs.  Not interested in quitting.  Review of Systems For ROS see HPI     Objective:   Physical Exam Vitals signs reviewed.  Constitutional:      General: He is not in acute distress.    Appearance: He is well-developed.  HENT:     Head: Normocephalic and atraumatic.  Eyes:     Conjunctiva/sclera: Conjunctivae normal.     Pupils: Pupils are equal, round, and reactive to light.  Neck:     Musculoskeletal: Normal range of motion and neck supple.     Thyroid: No thyromegaly.  Cardiovascular:     Rate and Rhythm: Normal rate and regular rhythm.     Heart sounds: Normal heart sounds. No murmur.  Pulmonary:     Effort: Pulmonary effort is normal. No respiratory distress.     Breath sounds: Normal breath sounds.  Abdominal:     General: Bowel sounds are normal. There is no distension.     Palpations: Abdomen is soft.  Lymphadenopathy:     Cervical: No cervical adenopathy.  Skin:    General: Skin is warm and dry.  Neurological:     Mental Status: He is alert and oriented to person, place, and time.     Cranial Nerves: No cranial nerve deficit.  Psychiatric:        Behavior: Behavior normal.           Assessment & Plan:  Medication side  effect- new.  Pt reports that since holding his Lithium, his loose stools and cramping have resolved.  He is now certain that the Lithium adversely impacts his bowel habits and causes diarrhea.  Will follow along and monitor if psych feels the need to restart.

## 2018-09-11 ENCOUNTER — Encounter: Payer: Self-pay | Admitting: Certified Registered Nurse Anesthetist

## 2018-09-11 ENCOUNTER — Encounter (HOSPITAL_BASED_OUTPATIENT_CLINIC_OR_DEPARTMENT_OTHER)
Admission: RE | Admit: 2018-09-11 | Discharge: 2018-09-11 | Disposition: A | Discharge status: Home / Self Care | Payer: 59 | Source: Ambulatory Visit | Attending: Psychiatry | Admitting: Psychiatry

## 2018-09-11 DIAGNOSIS — Z881 Allergy status to other antibiotic agents status: Secondary | ICD-10-CM | POA: Diagnosis not present

## 2018-09-11 DIAGNOSIS — J449 Chronic obstructive pulmonary disease, unspecified: Secondary | ICD-10-CM | POA: Diagnosis not present

## 2018-09-11 DIAGNOSIS — F419 Anxiety disorder, unspecified: Secondary | ICD-10-CM | POA: Diagnosis not present

## 2018-09-11 DIAGNOSIS — F319 Bipolar disorder, unspecified: Secondary | ICD-10-CM | POA: Diagnosis not present

## 2018-09-11 DIAGNOSIS — F17210 Nicotine dependence, cigarettes, uncomplicated: Secondary | ICD-10-CM | POA: Diagnosis not present

## 2018-09-11 DIAGNOSIS — Z818 Family history of other mental and behavioral disorders: Secondary | ICD-10-CM | POA: Diagnosis not present

## 2018-09-11 DIAGNOSIS — F3181 Bipolar II disorder: Secondary | ICD-10-CM | POA: Diagnosis not present

## 2018-09-11 DIAGNOSIS — F332 Major depressive disorder, recurrent severe without psychotic features: Secondary | ICD-10-CM | POA: Diagnosis not present

## 2018-09-11 MED ORDER — SODIUM CHLORIDE 0.9 % IV SOLN
INTRAVENOUS | Status: DC | PRN
  Administered 2018-09-11: 10:00:00 via INTRAVENOUS

## 2018-09-11 MED ORDER — KETOROLAC TROMETHAMINE 30 MG/ML IJ SOLN
INTRAMUSCULAR | Status: AC
  Administered 2018-09-11: 30 mg via INTRAVENOUS
  Filled 2018-09-11: qty 1

## 2018-09-11 MED ORDER — MIDAZOLAM HCL 2 MG/2ML IJ SOLN
INTRAMUSCULAR | Status: DC | PRN
  Administered 2018-09-11: 2 mg via INTRAVENOUS

## 2018-09-11 MED ORDER — METHOHEXITAL SODIUM 100 MG/10ML IV SOSY
PREFILLED_SYRINGE | INTRAVENOUS | Status: DC | PRN
  Administered 2018-09-11: 120 mg via INTRAVENOUS

## 2018-09-11 MED ORDER — MIDAZOLAM HCL 2 MG/2ML IJ SOLN
INTRAMUSCULAR | Status: AC
  Filled 2018-09-11: qty 2

## 2018-09-11 MED ORDER — KETOROLAC TROMETHAMINE 30 MG/ML IJ SOLN
30.0000 mg | Freq: Once | INTRAMUSCULAR | Status: AC
  Administered 2018-09-11: 30 mg via INTRAVENOUS

## 2018-09-11 MED ORDER — GLYCOPYRROLATE 0.2 MG/ML IJ SOLN
INTRAMUSCULAR | Status: AC
  Administered 2018-09-11: 0.3 mg via INTRAVENOUS
  Filled 2018-09-11: qty 2

## 2018-09-11 MED ORDER — SUCCINYLCHOLINE CHLORIDE 20 MG/ML IJ SOLN
INTRAMUSCULAR | Status: DC | PRN
  Administered 2018-09-11: 150 mg via INTRAVENOUS

## 2018-09-11 MED ORDER — MIDAZOLAM HCL 2 MG/2ML IJ SOLN: 2.0000 mg | Freq: Once | INTRAMUSCULAR | Status: DC

## 2018-09-11 MED ORDER — SODIUM CHLORIDE 0.9 % IV SOLN
500.0000 mL | Freq: Once | INTRAVENOUS | Status: AC
  Administered 2018-09-11: 500 mL via INTRAVENOUS

## 2018-09-11 MED ORDER — GLYCOPYRROLATE 0.2 MG/ML IJ SOLN
0.3000 mg | Freq: Once | INTRAMUSCULAR | Status: AC
  Administered 2018-09-11: 0.3 mg via INTRAVENOUS

## 2018-09-11 NOTE — Assessment & Plan Note (Signed)
Pt smoking 1-1.5ppd.  Stressed need for him to stop smoking.  He is not interested at this time.  'I just need to get through this ECT'.  Will follow.

## 2018-09-11 NOTE — Transfer of Care (Signed)
Immediate Anesthesia Transfer of Care Note  Patient: Matthew [name]  Procedure(s) Performed: ECT TX  Patient Location: PACU  Anesthesia Type:General  Level of Consciousness: drowsy and patient cooperative  Airway & Oxygen Therapy: Patient Spontanous Breathing  Post-op Assessment: Report given to RN, Post -op Vital signs reviewed and stable and Patient moving all extremities  Post vital signs: Reviewed and stable  Last Vitals:  Vitals Value Taken Time  BP 131/86 09/11/2018 10:31 AM  Temp 36.6 C 09/11/2018 10:31 AM  Pulse 89 09/11/2018 10:32 AM  Resp 9 09/11/2018 10:32 AM  SpO2 97 % 09/11/2018 10:32 AM  Vitals shown include unvalidated device data.  Last Pain:  Vitals:   09/11/18 1031  TempSrc:   PainSc: 0-No pain         Complications: No apparent anesthesia complications

## 2018-09-11 NOTE — Discharge Instructions (Signed)
1)  The drugs that you have been given will stay in your system until tomorrow so for the       next 24 hours you should not:  A. Drive an automobile  B. Make any legal decisions  C. Drink any alcoholic beverages  2)  You may resume your regular meals upon return home.  3)  A responsible adult must take you home.  Someone should stay with you for a few          hours, then be available by phone for the remainder of the treatment day.  4)  You May experience any of the following symptoms:  Headache, Nausea and a dry mouth (due to the medications you were given),  temporary memory loss and some confusion, or sore muscles (a warm bath  should help this).  If you you experience any of these symptoms let us know on                your return visit.  5)  Report any of the following: any acute discomfort, severe headache, or temperature        greater than 100.5 F.   Also report any unusual redness, swelling, drainage, or pain         at your IV site.    You may report Symptoms to:  ECT PROGRAM- [place] at [facility]          Phone: [phone], ECT Department           or Dr. Shary Key office [phone]  6)  Your next ECT Treatment is Friday January 31   We will call 2 days prior to your scheduled appointment for arrival times.  7)  Nothing to eat or drink after midnight the night before your procedure.  8)  Take     With a sip of water the morning of your procedure.  9)  Other Instructions: Call [phone] to cancel the morning of your procedure due         to illness or emergency.  10) We will call within 72 hours to assess how you are feeling.

## 2018-09-11 NOTE — Anesthesia Preprocedure Evaluation (Signed)
Anesthesia Evaluation  Patient identified by MRN, date of birth, ID band Patient awake    Reviewed: Allergy & Precautions, NPO status , Patient's Chart, lab work & pertinent test results  History of Anesthesia Complications Negative for: history of anesthetic complications  Airway Mallampati: III  TM Distance: >3 FB Neck ROM: Full    Dental no notable dental hx.    Pulmonary asthma , neg sleep apnea, COPD,  COPD inhaler, Current Smoker,    breath sounds clear to auscultation- rhonchi (-) wheezing      Cardiovascular Exercise Tolerance: Good (-) hypertension(-) CAD, (-) Past MI, (-) Cardiac Stents and (-) CABG  Rhythm:Regular Rate:Normal - Systolic murmurs and - Diastolic murmurs    Neuro/Psych neg Seizures PSYCHIATRIC DISORDERS Anxiety Depression Bipolar Disorder negative neurological ROS     GI/Hepatic negative GI ROS, Neg liver ROS,   Endo/Other  negative endocrine ROSneg diabetes  Renal/GU negative Renal ROS     Musculoskeletal negative musculoskeletal ROS (+)   Abdominal (+) - obese,   Peds  Hematology negative hematology ROS (+)   Anesthesia Other Findings Past Medical History: 01/2012: Acute kidney failure (HCC) No date: Anxiety No date: Bipolar disorder (HCC)     Comment:  hypomania No date: COPD (chronic obstructive pulmonary disease) (HCC) No date: Depression No date: Low back pain   Reproductive/Obstetrics                             Anesthesia Physical  Anesthesia Plan  ASA: II  Anesthesia Plan: General   Post-op Pain Management:    Induction: Intravenous  PONV Risk Score and Plan: 0 and Ondansetron  Airway Management Planned: Mask  Additional Equipment:   Intra-op Plan:   Post-operative Plan:   Informed Consent: I have reviewed the patients History and Physical, chart, labs and discussed the procedure including the risks, benefits and alternatives for the  proposed anesthesia with the patient or authorized representative who has indicated his/her understanding and acceptance.     Dental advisory given  Plan Discussed with: CRNA and Anesthesiologist  Anesthesia Plan Comments:         Anesthesia Quick Evaluation

## 2018-09-11 NOTE — Assessment & Plan Note (Signed)
New to provider, ongoing for pt.  Currently asymptomatic.  Typically only flares w/ respiratory illnesses.  Has albuterol inhaler at home to use PRN.  Stressed the need for him to stop smoking.

## 2018-09-11 NOTE — H&P (Signed)
Matthew [name] is an 48 y.o. male.   Chief Complaint: Patient has felt like his mood has improved.  Significantly calmer.  No current suicidal ideation. HPI: History of recurrent severe depression  Past Medical History:  Diagnosis Date  . Acute kidney failure (HCC) 01/2012  . Anxiety   . Bipolar disorder (HCC)    hypomania  . COPD (chronic obstructive pulmonary disease) (HCC)   . Depression   . Low back pain     Past Surgical History:  Procedure Laterality Date  . NO PAST SURGERIES      Family History  Problem Relation Age of Onset  . Anxiety disorder Mother   . Alcohol abuse Father   . Drug abuse Father   . Bipolar disorder Sister   . Alcohol abuse Brother   . Drug abuse Brother   . Bipolar disorder Paternal Aunt   . Schizophrenia Paternal Aunt   . Cancer Maternal Grandmother        lung  . Hypertension Maternal Grandfather   . Cancer Paternal Grandmother        lung  . Hypertension Paternal Grandfather   . Diabetes Neg Hx   . Heart disease Neg Hx   . Hyperlipidemia Neg Hx   . Stroke Neg Hx   . Suicidality Neg Hx    Social History:  reports that he has been smoking cigarettes. He has a 20.00 pack-year smoking history. He has never used smokeless tobacco. He reports that he does not drink alcohol or use drugs.  Allergies:  Allergies  Allergen Reactions  . Ciprofloxacin     Acute kidney failure    (Not in a hospital admission)   No results found for this or any previous visit (from the past 48 hour(s)). No results found.  Review of Systems  Constitutional: Negative.   HENT: Negative.   Eyes: Negative.   Respiratory: Negative.   Cardiovascular: Negative.   Gastrointestinal: Negative.   Musculoskeletal: Negative.   Skin: Negative.   Neurological: Negative.   Psychiatric/Behavioral: Negative.  Negative for depression and suicidal ideas.    Blood pressure 122/86, pulse 81, temperature 98.2 F (36.8 C), temperature source Oral, resp. rate 18,  SpO2 98 %. Physical Exam  Nursing note and vitals reviewed. Constitutional: He appears well-developed and well-nourished.  HENT:  Head: Normocephalic and atraumatic.  Eyes: Pupils are equal, round, and reactive to light. Conjunctivae are normal.  Neck: Normal range of motion.  Cardiovascular: Regular rhythm and normal heart sounds.  Respiratory: Effort normal.  GI: Soft.  Musculoskeletal: Normal range of motion.  Neurological: He is alert.  Skin: Skin is warm and dry.  Psychiatric: He has a normal mood and affect. His speech is normal and behavior is normal. Judgment and thought content normal. His affect is not blunt. His speech is not delayed. He is not slowed. Cognition and memory are normal.     Assessment/Plan Appears to have shown significant improvement but having mild side effects.  Plan is to stop index course after today and follow-up in 1 week  [name], MD 09/11/2018, 10:13 AM

## 2018-09-11 NOTE — Anesthesia Post-op Follow-up Note (Signed)
Anesthesia [facility] form completed.        

## 2018-09-11 NOTE — Anesthesia Postprocedure Evaluation (Signed)
Anesthesia Post Note  Patient: Matthew [name]  Procedure(s) Performed: ECT TX  Patient location during evaluation: PACU Anesthesia Type: General Level of consciousness: awake and alert Pain management: pain level controlled Vital Signs Assessment: post-procedure vital signs reviewed and stable Respiratory status: spontaneous breathing, nonlabored ventilation and respiratory function stable Cardiovascular status: blood pressure returned to baseline and stable Postop Assessment: no signs of nausea or vomiting Anesthetic complications: no     Last Vitals:  Vitals:   09/11/18 1041 09/11/18 1048  BP: 139/84 115/78  Pulse:  69  Resp: 14 18  Temp:  36.7 C  SpO2:      Last Pain:  Vitals:   09/11/18 1048  TempSrc: Oral  PainSc: 0-No pain                 [name]

## 2018-09-11 NOTE — Assessment & Plan Note (Signed)
New to provider, ongoing for pt.  Following w/ psychiatry.  Currently getting ECT treatments.  He feels these are helping.  Will follow along.

## 2018-09-11 NOTE — Procedures (Signed)
ECT SERVICES Physician's Interval Evaluation & Treatment Note  Patient Identification: Matthew DarbyRobert L Canterbury [name] MRN:  [id number] Date of Evaluation:  09/11/2018 TX #: [id number]  MADRS:   MMSE:   P.E. Findings:  No change to physical  Psychiatric Interval Note:  Patient reports mood is better and more calm.  He does have a little bit of a headache  Subjective:  Patient is a 48 y.o. male seen for evaluation for Electroconvulsive Therapy. Feeling better seems to be about the same improvement as we saw Wednesday  Treatment Summary:   [x]   Right Unilateral             []  Bilateral   % Energy : 0.3 ms 50%   Impedance: 2030 ohms  Seizure Energy Index: We had a loose lead and did not get very good reading on this  Postictal Suppression Index: Same here  Seizure Concordance Index: 38% reading but that is not reliable  Medications  Pre Shock: Robinul 0.3 mg Toradol 30 mg Brevital 120 mg succinylcholine 150 mg  Post Shock: Versed 2 mg  Seizure Duration: 38 seconds by EMG about 57 seconds EEG   Comments: Good-quality seizure despite some difficulty with the reading.  We have discussed this and the plan is to switch to early maintenance schedule see him back in 1 week.  Lungs:  [x]   Clear to auscultation               []  Other:   Heart:    [x]   Regular rhythm             []  irregular rhythm    [x]   Previous H&P reviewed, patient examined and there are NO CHANGES                 []   Previous H&P reviewed, patient examined and there are changes noted.   Mordecai RasmussenJohn Clapacs, MD 1/24/202010:15 AM

## 2018-09-16 ENCOUNTER — Telehealth: Payer: Self-pay

## 2018-09-17 ENCOUNTER — Ambulatory Visit (INDEPENDENT_AMBULATORY_CARE_PROVIDER_SITE_OTHER): Payer: 59 | Admitting: Psychiatry

## 2018-09-17 ENCOUNTER — Encounter (HOSPITAL_COMMUNITY): Payer: Self-pay | Admitting: Psychiatry

## 2018-09-17 ENCOUNTER — Ambulatory Visit (HOSPITAL_COMMUNITY): Payer: 59 | Admitting: Psychiatry

## 2018-09-17 ENCOUNTER — Other Ambulatory Visit: Payer: Self-pay | Admitting: Psychiatry

## 2018-09-17 VITALS — BP 124/78 | Ht 77.0 in | Wt 238.4 lb

## 2018-09-17 DIAGNOSIS — F3181 Bipolar II disorder: Secondary | ICD-10-CM | POA: Diagnosis not present

## 2018-09-17 DIAGNOSIS — F411 Generalized anxiety disorder: Secondary | ICD-10-CM | POA: Diagnosis not present

## 2018-09-17 DIAGNOSIS — F410 Panic disorder [episodic paroxysmal anxiety] without agoraphobia: Secondary | ICD-10-CM | POA: Diagnosis not present

## 2018-09-17 MED ORDER — DIVALPROEX SODIUM ER 250 MG PO TB24: ORAL_TABLET | ORAL | 2 refills | Status: DC

## 2018-09-17 MED ORDER — VENLAFAXINE HCL ER 75 MG PO CP24: 225.0000 mg | ORAL_CAPSULE | Freq: Every day | ORAL | 2 refills | Status: DC

## 2018-09-17 MED ORDER — LORAZEPAM 1 MG PO TABS: 1.0000 mg | ORAL_TABLET | Freq: Three times a day (TID) | ORAL | 2 refills | Status: DC | PRN

## 2018-09-17 MED ORDER — BUSPIRONE HCL 15 MG PO TABS: 15.0000 mg | ORAL_TABLET | Freq: Two times a day (BID) | ORAL | 2 refills | Status: DC

## 2018-09-17 MED FILL — LORazepam 1 MG TABS: 1 | 30 days supply | Qty: 90 | Fill #0

## 2018-09-17 MED FILL — busPIRone HCL 15 MG TABS: 15 | 30 days supply | Qty: 60 | Fill #0

## 2018-09-17 MED FILL — DIVALPROEX SOD ER 250 MG TA: 250 | 30 days supply | Qty: 90 | Fill #0

## 2018-09-17 MED FILL — VENLAFAXINE HCL ER 75 MG CA: 75 | 30 days supply | Qty: 90 | Fill #0

## 2018-09-17 NOTE — Progress Notes (Signed)
BH MD/PA/NP OP Progress Note  09/17/2018 8:29 AM Matthew [name]  MRN:  [id number]  Chief Complaint:  Chief Complaint    Depression; Follow-up     HPI: Matthew [name] has completed 6 treatments of ECT.  He states after the fourth treatment he started to feel better.  His last treatment was last Friday.  This Friday he will follow-up with Dr. Delaney Meigslaypacs to discuss results and potential maintenance treatment.  Matthew [name] tells me that the days he had the treatment he was experiencing severe headaches and fatigue.  It would gradually improve over the next day.  Since concluding treatment he feels as though his depression has significantly improved and has not really been noticing it.  He denies isolation and irritability.  He denies anhedonia and hopelessness.  He reports that the last time he experienced any SI was prior to starting ECT.  He denies HI.  He is denying any manic or hypomanic-like symptoms.  His anxiety is present but not as intense.  He has not had to take the Ativan during the day.  Matthew [name] is able to deal with the stress of his new position and is taking a more healthy approach to organizing his schedule.  He takes Ativan at night and is able to sleep well.  Matthew [name] shared his depression struggles and treatment with several colleagues.  It was a relief to him to do so as now he does not feels so guarded.  He has been off the lithium for 3 weeks now.  He continued Depakote, Effexor, BuSpar and took Ativan after treatment for sleep.   Visit Diagnosis:    ICD-10-CM   1. Bipolar 2 disorder (HCC) F31.81 divalproex (DEPAKOTE ER) 250 MG 24 hr tablet    venlafaxine XR (EFFEXOR-XR) 75 MG 24 hr capsule  2. GAD (generalized anxiety disorder) F41.1 busPIRone (BUSPAR) 15 MG tablet    LORazepam (ATIVAN) 1 MG tablet    venlafaxine XR (EFFEXOR-XR) 75 MG 24 hr capsule  3. Panic 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 F41.0 LORazepam (ATIVAN) 1 MG tablet    venlafaxine XR (EFFEXOR-XR) 75 MG 24 hr capsule    Past  Psychiatric History:  Anxiety:Yes Bipolar Disorder:No Depression:Yes Mania:No Psychosis:No Schizophrenia:No Personality Disorder:No Hospitalization for psychiatric illness:No History of Electroconvulsive Shock Therapy:No Prior Suicide Attempts:No    Past Medical History:  Past Medical History:  Diagnosis Date  . Acute kidney failure (HCC) 01/2012  . Anxiety   . Bipolar disorder (HCC)    hypomania  . COPD (chronic obstructive pulmonary disease) (HCC)   . Depression   . Low back pain     Past Surgical History:  Procedure Laterality Date  . NO PAST SURGERIES      Family Psychiatric History:  Family History  Problem Relation Age of Onset  . Anxiety disorder Mother   . Alcohol abuse Father   . Drug abuse Father   . Bipolar disorder Sister   . Alcohol abuse Brother   . Drug abuse Brother   . Bipolar disorder Paternal Aunt   . Schizophrenia Paternal Aunt   . Cancer Maternal Grandmother        lung  . Hypertension Maternal Grandfather   . Cancer Paternal Grandmother        lung  . Hypertension Paternal Grandfather   . Diabetes Neg Hx   . Heart disease Neg Hx   . Hyperlipidemia Neg Hx   . Stroke Neg Hx   . Suicidality Neg Hx     Social History:  Social  History   Socioeconomic History  . Marital status: Married    Spouse name: Not on file  . Number of children: Not on file  . Years of education: Not on file  . Highest education level: Not on file  Occupational History  . Not on file  Social Needs  . Financial resource strain: Not on file  . Food insecurity:    Worry: Not on file    Inability: Not on file  . Transportation needs:    Medical: Not on file    Non-medical: Not on file  Tobacco Use  . Smoking status: Current Every Day Smoker    Packs/day: 1.00    Years: 20.00    Pack years: 20.00    Types: Cigarettes  . Smokeless tobacco: Never Used  Substance and Sexual Activity  . Alcohol use: No    Comment: Occasionally on holidays.  .  Drug use: No  . Sexual activity: Yes    Partners: Female    Birth control/protection: None  Lifestyle  . Physical activity:    Days per week: Not on file    Minutes per session: Not on file  . Stress: Not on file  Relationships  . Social connections:    Talks on phone: Not on file    Gets together: Not on file    Attends religious service: Not on file    Active member of club or organization: Not on file    Attends meetings of clubs or organizations: Not on file    Relationship status: Not on file  Other Topics Concern  . Not on file  Social History Narrative  . Not on file    Allergies:  Allergies  Allergen Reactions  . Ciprofloxacin     Acute kidney failure    Metabolic Disorder Labs: Lab Results  Component Value Date   HGBA1C 5.2 03/12/2018   MPG 103 02/03/2014   Lab Results  Component Value Date   PROLACTIN 15.7 (H) 03/12/2018   PROLACTIN 6.4 04/24/2017   Lab Results  Component Value Date   CHOL 155 03/12/2018   TRIG 360 (H) 03/12/2018   HDL 26 (L) 03/12/2018   CHOLHDL 5 01/21/2011   VLDL 34.8 01/21/2011   LDLCALC 57 03/12/2018   LDLCALC 50 04/24/2017   Lab Results  Component Value Date   TSH 1.850 03/12/2018   TSH 2.020 04/24/2017    Therapeutic Level Labs: Lab Results  Component Value Date   LITHIUM 0.6 03/12/2018   LITHIUM 0.9 04/24/2017   Lab Results  Component Value Date   VALPROATE 34 (L) 03/12/2018   VALPROATE 46 (L) 04/24/2017   No components found for:  CBMZ  Current Medications: Current Outpatient Medications  Medication Sig Dispense Refill  . baclofen (LIORESAL) 10 MG tablet Take 10 mg by mouth as needed for muscle spasms.    . busPIRone (BUSPAR) 15 MG tablet Take 1 tablet (15 mg total) by mouth 2 (two) times daily. 60 tablet 2  . celecoxib (CELEBREX) 100 MG capsule Take 100 mg by mouth as needed for mild pain.     . cyclobenzaprine (FLEXERIL) 10 MG tablet Take 1 tablet (10 mg total) by mouth 3 (three) times daily as needed.  (Patient taking differently: Take 10 mg by mouth 3 (three) times daily as needed for muscle spasms. ) 30 tablet 0  . divalproex (DEPAKOTE ER) 250 MG 24 hr tablet Take one tab po qAM and 2 tabs po qPM 90 tablet 2  . gabapentin (  NEURONTIN) 300 MG capsule Take 300 mg by mouth daily as needed. For pain    . LORazepam (ATIVAN) 1 MG tablet Take 1 tablet (1 mg total) by mouth 3 (three) times daily as needed for anxiety. 90 tablet 2  . tadalafil (CIALIS) 5 MG tablet TAKE 1 TABLET (5 MG TOTAL) BY MOUTH DAILY AS NEEDED FOR ERECTILE DYSFUNCTION. 10 tablet 1  . venlafaxine XR (EFFEXOR-XR) 75 MG 24 hr capsule Take 3 capsules (225 mg total) by mouth daily with breakfast. 90 capsule 2   No current facility-administered medications for this visit.      Musculoskeletal: Strength & Muscle Tone: normal Gait & Station: normal Patient leans: straight   Psychiatric Specialty Exam: Review of Systems  Constitutional: Negative for chills, fever and malaise/fatigue.  Respiratory: Negative for cough, sputum production and shortness of breath.     Blood pressure 124/78, height 6\' 5"  (1.956 m), weight 238 lb 6.4 oz (108.1 kg).Body mass index is 28.27 kg/m.  General Appearance: Well Groomed and wearing a 3 piece suit  Eye Contact:  Good  Speech:  Clear and Coherent and Normal Rate  Volume:  Normal  Mood:  Euthymic  Affect:  Full Range and pleasent and happy and relaxed  Thought Process:  Goal Directed and Descriptions of Associations: Intact  Orientation:  Full (Time, Place, and Person)  Thought Content:  Logical  Suicidal Thoughts:  No  Homicidal Thoughts:  No  Memory:  Immediate;   Good  Judgement:  Good  Insight:  Good  Psychomotor Activity:  Normal  Concentration:  Concentration: Good  Recall:  Good  Fund of Knowledge:  Good  Language:  Good  Akathisia:  No  Handed:  Right  AIMS (if indicated):     Assets:  Communication Skills Desire for Improvement Financial  Resources/Insurance Housing Intimacy Resilience Social Support Talents/Skills Transportation Vocational/Educational  ADL's:  Intact  Cognition:  WNL  Sleep:   good            Screenings: ECT-MADRS     ECT Treatment from 09/07/2018 in [facility] ECT Treatment from 08/31/2018 in [facility]  MADRS Total Score  22  40    Mini-Mental     ECT Treatment from 09/07/2018 in [facility] ECT Treatment from 08/31/2018 in [facility]  Total Score (max 30 points )  30  30    PHQ2-9     Office Visit from 09/10/2018 in [facility] Office Visit from 12/22/2015 in [facility]  PHQ-2 Total Score  0  0  PHQ-9 Total Score  0  -      I reviewed the information below on 09/17/2018 and have updated it Assessment and Plan: Bipolar II d/o- depressed; Panic 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; GAD; Insomnia   Medication management with supportive therapy. Risks and benefits, side effects and alternative treatment options discussed with patient. Pt was given an opportunity to ask questions about medication, illness, and treatment. All current psychiatric medications have been reviewed and discussed with the patient and adjusted as clinically appropriate. The patient has been provided an accurate and updated list of the medications being now prescribed. Patient expressed understanding of how their medications were to be used. Pt verbalized understanding and verbal consent obtained for treatment.  Status of current problems: significantly improved  Meds: d/cLithobid- he has been off it for 3 weeks. Will restart if needed  Depakote ER 250mg  po qAM and 500mg  po qHS for Bipolar disorder Buspar 15mg  po BID for GAD Ativan 1mg  po TID prn anxiety for GAD Effexor XR 225mg  po qD for depression, GAD and panic attacks Cialis  5mg  po prn sexual activity- pt reports it is effective and he denies SE  Pt completed 6 treatments of ECT and reports that his depression has significantly improved. We discussed potentially needing maintence ECT in the future if depression returns  Labs: 08/28/2018 labs- BMP WNL, CBC wnl   Therapy: brief supportive therapy provided. Discussed psychosocial stressors in detail.   Consultations: none  Pt reports he is no longer experiencing SI. The last time was 3 weeks ago prior to starting ECT.  He is at an acute low risk of suicide.  He has no prior hx of suicide attempts. He has + social support, responsibility towards family and compliance with meds, he is employed and denies substance abuse. Negative factors include strong family hx of mental illness. Patient told to call clinic if any problems occur. Patient advised to go to ER if they should develop new SI/HI, side effects, or if symptoms worsen. Has crisis numbers to call if needed.   F/up in2 weekssooner if needed  The duration of this appointment visit was 30 minutes of face-to-face time with the patient.  Greater than 50% of this time was spent in counseling, explanation of  diagnosis, planning of further management, and coordination of care  [name], MD 09/17/2018, 8:29 AM

## 2018-09-18 ENCOUNTER — Inpatient Hospital Stay: Admission: RE | Admit: 2018-09-18 | Payer: Self-pay | Source: Ambulatory Visit

## 2018-09-18 ENCOUNTER — Encounter: Payer: Self-pay | Admitting: Anesthesiology

## 2018-10-01 ENCOUNTER — Encounter (HOSPITAL_COMMUNITY): Payer: Self-pay | Admitting: Psychiatry

## 2018-10-01 ENCOUNTER — Ambulatory Visit (HOSPITAL_COMMUNITY): Payer: 59 | Admitting: Psychiatry

## 2018-10-01 NOTE — Progress Notes (Unsigned)
BH MD/PA/NP OP Progress Note  10/01/2018 4:49 PM Matthew [name]  MRN:  [id number]  Chief Complaint:   HPI: Matthew [name] has completed 6 treatments of ECT.  He states after the fourth treatment he started to feel better.  His last treatment was last Friday.  This Friday he will follow-up with Dr. Delaney Meigs to discuss results and potential maintenance treatment.  Matthew [name] tells me that the days he had the treatment he was experiencing severe headaches and fatigue.  It would gradually improve over the next day.  Since concluding treatment he feels as though his depression has significantly improved and has not really been noticing it.  He denies isolation and irritability.  He denies anhedonia and hopelessness.  He reports that the last time he experienced any SI was prior to starting ECT.  He denies HI.  He is denying any manic or hypomanic-like symptoms.  His anxiety is present but not as intense.  He has not had to take the Ativan during the day.  Matthew [name] is able to deal with the stress of his new position and is taking a more healthy approach to organizing his schedule.  He takes Ativan at night and is able to sleep well.  Matthew [name] shared his depression struggles and treatment with several colleagues.  It was a relief to him to do so as now he does not feels so guarded.  He has been off the lithium for 3 weeks now.  He continued Depakote, Effexor, BuSpar and took Ativan after treatment for sleep.   Visit Diagnosis:  No diagnosis found.  Past Psychiatric History:  Anxiety:Yes Bipolar Disorder:No Depression:Yes Mania:No Psychosis:No Schizophrenia:No Personality Disorder:No Hospitalization for psychiatric illness:No History of Electroconvulsive Shock Therapy:No Prior Suicide Attempts:No    Past Medical History:  Past Medical History:  Diagnosis Date  . Acute kidney failure (HCC) 01/2012  . Anxiety   . Bipolar disorder (HCC)    hypomania  . COPD (chronic obstructive pulmonary  disease) (HCC)   . Depression   . Low back pain     Past Surgical History:  Procedure Laterality Date  . NO PAST SURGERIES      Family Psychiatric History:  Family History  Problem Relation Age of Onset  . Anxiety disorder Mother   . Alcohol abuse Father   . Drug abuse Father   . Bipolar disorder Sister   . Alcohol abuse Brother   . Drug abuse Brother   . Bipolar disorder Paternal Aunt   . Schizophrenia Paternal Aunt   . Cancer Maternal Grandmother        lung  . Hypertension Maternal Grandfather   . Cancer Paternal Grandmother        lung  . Hypertension Paternal Grandfather   . Diabetes Neg Hx   . Heart disease Neg Hx   . Hyperlipidemia Neg Hx   . Stroke Neg Hx   . Suicidality Neg Hx     Social History:  Social History   Socioeconomic History  . Marital status: Married    Spouse name: Not on file  . Number of children: Not on file  . Years of education: Not on file  . Highest education level: Not on file  Occupational History  . Not on file  Social Needs  . Financial resource strain: Not on file  . Food insecurity:    Worry: Not on file    Inability: Not on file  . Transportation needs:    Medical: Not on file  Non-medical: Not on file  Tobacco Use  . Smoking status: Current Every Day Smoker    Packs/day: 1.00    Years: 20.00    Pack years: 20.00    Types: Cigarettes  . Smokeless tobacco: Never Used  Substance and Sexual Activity  . Alcohol use: No    Comment: Occasionally on holidays.  . Drug use: No  . Sexual activity: Yes    Partners: Female    Birth control/protection: None  Lifestyle  . Physical activity:    Days per week: Not on file    Minutes per session: Not on file  . Stress: Not on file  Relationships  . Social connections:    Talks on phone: Not on file    Gets together: Not on file    Attends religious service: Not on file    Active member of club or organization: Not on file    Attends meetings of clubs or organizations:  Not on file    Relationship status: Not on file  Other Topics Concern  . Not on file  Social History Narrative  . Not on file    Allergies:  Allergies  Allergen Reactions  . Ciprofloxacin     Acute kidney failure    Metabolic Disorder Labs: Lab Results  Component Value Date   HGBA1C 5.2 03/12/2018   MPG 103 02/03/2014   Lab Results  Component Value Date   PROLACTIN 15.7 (H) 03/12/2018   PROLACTIN 6.4 04/24/2017   Lab Results  Component Value Date   CHOL 155 03/12/2018   TRIG 360 (H) 03/12/2018   HDL 26 (L) 03/12/2018   CHOLHDL 5 01/21/2011   VLDL 34.8 01/21/2011   LDLCALC 57 03/12/2018   LDLCALC 50 04/24/2017   Lab Results  Component Value Date   TSH 1.850 03/12/2018   TSH 2.020 04/24/2017    Therapeutic Level Labs: Lab Results  Component Value Date   LITHIUM 0.6 03/12/2018   LITHIUM 0.9 04/24/2017   Lab Results  Component Value Date   VALPROATE 34 (L) 03/12/2018   VALPROATE 46 (L) 04/24/2017   No components found for:  CBMZ  Current Medications: Current Outpatient Medications  Medication Sig Dispense Refill  . baclofen (LIORESAL) 10 MG tablet Take 10 mg by mouth as needed for muscle spasms.    . busPIRone (BUSPAR) 15 MG tablet Take 1 tablet (15 mg total) by mouth 2 (two) times daily. 60 tablet 2  . celecoxib (CELEBREX) 100 MG capsule Take 100 mg by mouth as needed for mild pain.     . cyclobenzaprine (FLEXERIL) 10 MG tablet Take 1 tablet (10 mg total) by mouth 3 (three) times daily as needed. (Patient taking differently: Take 10 mg by mouth 3 (three) times daily as needed for muscle spasms. ) 30 tablet 0  . divalproex (DEPAKOTE ER) 250 MG 24 hr tablet Take one tab po qAM and 2 tabs po qPM 90 tablet 2  . gabapentin (NEURONTIN) 300 MG capsule Take 300 mg by mouth daily as needed. For pain    . LORazepam (ATIVAN) 1 MG tablet Take 1 tablet (1 mg total) by mouth 3 (three) times daily as needed for anxiety. 90 tablet 2  . tadalafil (CIALIS) 5 MG tablet TAKE  1 TABLET (5 MG TOTAL) BY MOUTH DAILY AS NEEDED FOR ERECTILE DYSFUNCTION. 10 tablet 1  . venlafaxine XR (EFFEXOR-XR) 75 MG 24 hr capsule Take 3 capsules (225 mg total) by mouth daily with breakfast. 90 capsule 2   No  current facility-administered medications for this visit.      Musculoskeletal: Strength & Muscle Tone: normal Gait & Station: normal Patient leans: straight   Psychiatric Specialty Exam: Review of Systems  Constitutional: Negative for chills, fever and malaise/fatigue.  Respiratory: Negative for cough, sputum production and shortness of breath.     Blood pressure 132/78, height 6\' 5"  (1.956 m), weight 237 lb (107.5 kg).Body mass index is 28.1 kg/m.  General Appearance: Well Groomed and wearing a 3 piece suit  Eye Contact:  Good  Speech:  Clear and Coherent and Normal Rate  Volume:  Normal  Mood:  Euthymic  Affect:  Full Range and pleasent and happy and relaxed  Thought Process:  Goal Directed and Descriptions of Associations: Intact  Orientation:  Full (Time, Place, and Person)  Thought Content:  Logical  Suicidal Thoughts:  No  Homicidal Thoughts:  No  Memory:  Immediate;   Good  Judgement:  Good  Insight:  Good  Psychomotor Activity:  Normal  Concentration:  Concentration: Good  Recall:  Good  Fund of Knowledge:  Good  Language:  Good  Akathisia:  No  Handed:  Right  AIMS (if indicated):     Assets:  Communication Skills Desire for Improvement Financial Resources/Insurance Housing Intimacy Resilience Social Support Talents/Skills Transportation Vocational/Educational  ADL's:  Intact  Cognition:  WNL  Sleep:   good            Screenings: ECT-MADRS     ECT Treatment from 09/07/2018 in [facility] ECT Treatment from 08/31/2018 in [facility]  MADRS Total Score  22  40    Mini-Mental     ECT Treatment from 09/07/2018 in [facility] ECT  Treatment from 08/31/2018 in [facility]  Total Score (max 30 points )  30  30    PHQ2-9     Office Visit from 09/10/2018 in [facility] Office Visit from 12/22/2015 in [facility]  PHQ-2 Total Score  0  0  PHQ-9 Total Score  0  -      I reviewed the information below on 09/17/2018 and have updated it Assessment and Plan: Bipolar II d/o- depressed; Panic 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; GAD; Insomnia   Medication management with supportive therapy. Risks and benefits, side effects and alternative treatment options discussed with patient. Pt was given an opportunity to ask questions about medication, illness, and treatment. All current psychiatric medications have been reviewed and discussed with the patient and adjusted as clinically appropriate. The patient has been provided an accurate and updated list of the medications being now prescribed. Patient expressed understanding of how their medications were to be used. Pt verbalized understanding and verbal consent obtained for treatment.  Status of current problems: significantly improved  Meds: d/cLithobid- he has been off it for 3 weeks. Will restart if needed Depakote ER 250mg  po qAM and 500mg  po qHS for Bipolar disorder Buspar 15mg  po BID for GAD Ativan 1mg  po TID prn anxiety for GAD Effexor XR 225mg  po qD for depression, GAD and panic attacks Cialis 5mg  po prn sexual activity- pt reports it is effective and he denies SE  Pt completed 6 treatments of ECT and reports that his depression has significantly improved. We discussed potentially needing maintence ECT in the future if depression returns  Labs: 08/28/2018 labs- BMP WNL, CBC wnl   Therapy: brief supportive therapy provided. Discussed  psychosocial stressors in detail.   Consultations: none  Pt reports he is no longer experiencing SI. The last time was 3 weeks ago prior to  starting ECT.  He is at an acute low risk of suicide.  He has no prior hx of suicide attempts. He has + social support, responsibility towards family and compliance with meds, he is employed and denies substance abuse. Negative factors include strong family hx of mental illness. Patient told to call clinic if any problems occur. Patient advised to go to ER if they should develop new SI/HI, side effects, or if symptoms worsen. Has crisis numbers to call if needed.   F/up in2 weekssooner if needed  The duration of this appointment visit was 30 minutes of face-to-face time with the patient.  Greater than 50% of this time was spent in counseling, explanation of  diagnosis, planning of further management, and coordination of care  Oletta DarterSalina Abrianna Sidman, MD 10/01/2018, 4:49 PM

## 2018-10-15 ENCOUNTER — Encounter (HOSPITAL_COMMUNITY): Payer: Self-pay | Admitting: Psychiatry

## 2018-10-15 ENCOUNTER — Ambulatory Visit (INDEPENDENT_AMBULATORY_CARE_PROVIDER_SITE_OTHER): Payer: 59 | Admitting: Psychiatry

## 2018-10-15 DIAGNOSIS — F411 Generalized anxiety disorder: Secondary | ICD-10-CM | POA: Diagnosis not present

## 2018-10-15 DIAGNOSIS — F3181 Bipolar II disorder: Secondary | ICD-10-CM | POA: Diagnosis not present

## 2018-10-15 DIAGNOSIS — F410 Panic disorder [episodic paroxysmal anxiety] without agoraphobia: Secondary | ICD-10-CM | POA: Diagnosis not present

## 2018-10-15 MED ORDER — BUSPIRONE HCL 15 MG PO TABS: 15.0000 mg | ORAL_TABLET | Freq: Two times a day (BID) | ORAL | 2 refills | Status: DC

## 2018-10-15 MED ORDER — DIVALPROEX SODIUM ER 250 MG PO TB24: ORAL_TABLET | ORAL | 2 refills | Status: DC

## 2018-10-15 MED ORDER — ATOMOXETINE HCL 25 MG PO CAPS: 25.0000 mg | ORAL_CAPSULE | Freq: Two times a day (BID) | ORAL | 2 refills | Status: DC

## 2018-10-15 MED ORDER — VENLAFAXINE HCL ER 75 MG PO CP24: 225.0000 mg | ORAL_CAPSULE | Freq: Every day | ORAL | 2 refills | Status: DC

## 2018-10-15 MED ORDER — LORAZEPAM 1 MG PO TABS: 1.0000 mg | ORAL_TABLET | Freq: Three times a day (TID) | ORAL | 2 refills | Status: DC | PRN

## 2018-10-15 MED FILL — busPIRone HCL 15 MG TABS: 15 | 30 days supply | Qty: 60 | Fill #0

## 2018-10-15 MED FILL — DIVALPROEX SOD ER 250 MG TA: 250 | 30 days supply | Qty: 90 | Fill #0

## 2018-10-15 MED FILL — ATOMOXETINE HCL 25 MG CAPS: 25 | 30 days supply | Qty: 60 | Fill #0

## 2018-10-15 MED FILL — VENLAFAXINE HCL ER 75 MG CA: 75 | 30 days supply | Qty: 90 | Fill #0

## 2018-10-15 MED FILL — LORazepam 1 MG TABS: 1 | 30 days supply | Qty: 90 | Fill #0 | Status: TO

## 2018-10-15 NOTE — Progress Notes (Signed)
BH MD/PA/NP OP Progress Note  10/15/2018 10:06 AM [name]  MRN:  [id number]  Chief Complaint:  Chief Complaint    Anxiety; Follow-up     HPI: Macallan tells me that his mood is been stable.  He has been able to handle stress a little bit better.  His sleep is good.  He is denying any SI/HI.  He is denying any manic or hypomanic-like symptoms.  Adolph is experiencing less irritability although it is not completely gone.  He continues to experience anxiety during the day.  He takes half a tablet of Klonopin in the morning and that seems to help make it more manageable.  He is actively working on self-care.  Al has decided to take Friday evening off and Saturday and Sunday morning off.  He is attempting to adjust his Sunday night schedule so that he can relax before going to work on Monday.  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  He has been trying to work on it but finds that he is unable to focus.  As we talked about it some more he shared that he has always had issues with focus and procrastination well in school.  He would complete assignments but they would not be of good quality.  Occasionally he would come across some material that he enjoyed and would not find it difficult to complete any assignments related to it.  While in school he states that no one ever mentioned the possibility of ADHD to him.  Today he tells me that he is often easily distracted.  He will start projects but gets sidetracked and not complete them in a timely fashion.  He often feels overwhelmed during the day with the number of meetings that he has and the number of staff who come to talk to him.  During times that he is alone he does attempt to finish an assignment as quickly as possible.  Visit Diagnosis:    ICD-10-CM   1. GAD (generalized anxiety disorder) F41.1 busPIRone (BUSPAR) 15 MG tablet    LORazepam (ATIVAN) 1 MG tablet    venlafaxine XR  (EFFEXOR-XR) 75 MG 24 hr capsule  2. Bipolar 2 disorder (HCC) F31.81 divalproex (DEPAKOTE ER) 250 MG 24 hr tablet    venlafaxine XR (EFFEXOR-XR) 75 MG 24 hr capsule  3. Panic 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 F41.0 LORazepam (ATIVAN) 1 MG tablet    venlafaxine XR (EFFEXOR-XR) 75 MG 24 hr capsule    Past Psychiatric History:  Anxiety:Yes Bipolar Disorder:No Depression:Yes Mania:No Psychosis:No Schizophrenia:No Personality Disorder:No Hospitalization for psychiatric illness:No History of Electroconvulsive Shock Therapy:No Prior Suicide Attempts:No    Past Medical History:  Past Medical History:  Diagnosis Date  . Acute kidney failure (HCC) 01/2012  . Anxiety   . Bipolar disorder (HCC)    hypomania  . COPD (chronic obstructive pulmonary disease) (HCC)   . Depression   . Low back pain     Past Surgical History:  Procedure Laterality Date  . NO PAST SURGERIES      Family Psychiatric History:  Family History  Problem Relation Age of Onset  . Anxiety disorder Mother   . Alcohol abuse Father   . Drug abuse Father   . Bipolar disorder Sister   . Alcohol abuse Brother   . Drug abuse Brother   . Bipolar disorder Paternal Aunt   . Schizophrenia Paternal Aunt   . Cancer Maternal Grandmother  lung  . Hypertension Maternal Grandfather   . Cancer Paternal Grandmother        lung  . Hypertension Paternal Grandfather   . Diabetes Neg Hx   . Heart disease Neg Hx   . Hyperlipidemia Neg Hx   . Stroke Neg Hx   . Suicidality Neg Hx     Social History:  Social History   Socioeconomic History  . Marital status: Married    Spouse name: Not on file  . Number of children: Not on file  . Years of education: Not on file  . Highest education level: Not on file  Occupational History  . Not on file  Social Needs  . Financial resource strain: Not on file  . Food insecurity:    Worry: Not on file    Inability: Not on file  . Transportation needs:    Medical: Not  on file    Non-medical: Not on file  Tobacco Use  . Smoking status: Current Every Day Smoker    Packs/day: 1.00    Years: 20.00    Pack years: 20.00    Types: Cigarettes  . Smokeless tobacco: Never Used  Substance and Sexual Activity  . Alcohol use: No    Comment: Occasionally on holidays.  . Drug use: No  . Sexual activity: Yes    Partners: Female    Birth control/protection: None  Lifestyle  . Physical activity:    Days per week: Not on file    Minutes per session: Not on file  . Stress: Not on file  Relationships  . Social connections:    Talks on phone: Not on file    Gets together: Not on file    Attends religious service: Not on file    Active member of club or organization: Not on file    Attends meetings of clubs or organizations: Not on file    Relationship status: Not on file  Other Topics Concern  . Not on file  Social History Narrative  . Not on file    Allergies:  Allergies  Allergen Reactions  . Ciprofloxacin     Acute kidney failure    Metabolic Disorder Labs: Lab Results  Component Value Date   HGBA1C 5.2 03/12/2018   MPG 103 02/03/2014   Lab Results  Component Value Date   PROLACTIN 15.7 (H) 03/12/2018   PROLACTIN 6.4 04/24/2017   Lab Results  Component Value Date   CHOL 155 03/12/2018   TRIG 360 (H) 03/12/2018   HDL 26 (L) 03/12/2018   CHOLHDL 5 01/21/2011   VLDL 34.8 01/21/2011   LDLCALC 57 03/12/2018   LDLCALC 50 04/24/2017   Lab Results  Component Value Date   TSH 1.850 03/12/2018   TSH 2.020 04/24/2017    Therapeutic Level Labs: Lab Results  Component Value Date   LITHIUM 0.6 03/12/2018   LITHIUM 0.9 04/24/2017   Lab Results  Component Value Date   VALPROATE 34 (L) 03/12/2018   VALPROATE 46 (L) 04/24/2017   No components found for:  CBMZ  Current Medications: Current Outpatient Medications  Medication Sig Dispense Refill  . atomoxetine (STRATTERA) 25 MG capsule Take 1 capsule (25 mg total) by mouth 2 (two)  times daily. 60 capsule 2  . baclofen (LIORESAL) 10 MG tablet Take 10 mg by mouth as needed for muscle spasms.    . busPIRone (BUSPAR) 15 MG tablet Take 1 tablet (15 mg total) by mouth 2 (two) times daily. 60 tablet 2  .  celecoxib (CELEBREX) 100 MG capsule Take 100 mg by mouth as needed for mild pain.     . cyclobenzaprine (FLEXERIL) 10 MG tablet Take 1 tablet (10 mg total) by mouth 3 (three) times daily as needed. (Patient taking differently: Take 10 mg by mouth 3 (three) times daily as needed for muscle spasms. ) 30 tablet 0  . divalproex (DEPAKOTE ER) 250 MG 24 hr tablet Take one tab po qAM and 2 tabs po qPM 90 tablet 2  . gabapentin (NEURONTIN) 300 MG capsule Take 300 mg by mouth daily as needed. For pain    . LORazepam (ATIVAN) 1 MG tablet Take 1 tablet (1 mg total) by mouth 3 (three) times daily as needed for anxiety. 90 tablet 2  . tadalafil (CIALIS) 5 MG tablet TAKE 1 TABLET (5 MG TOTAL) BY MOUTH DAILY AS NEEDED FOR ERECTILE DYSFUNCTION. 10 tablet 1  . venlafaxine XR (EFFEXOR-XR) 75 MG 24 hr capsule Take 3 capsules (225 mg total) by mouth daily with breakfast. 90 capsule 2   No current facility-administered medications for this visit.      Musculoskeletal: Strength & Muscle Tone: normal Gait & Station: normal Patient leans: straight  Psychiatric Specialty Exam: Review of Systems  Constitutional: Negative for chills, diaphoresis and fever.  Respiratory: Negative for cough, shortness of breath and wheezing.     Blood pressure 130/80, height  (1.956 m), weight 234 lb (106.1 kg).Body mass index is 27.75 kg/m.  General Appearance: Well Groomed  Eye Contact:  Good  Speech:  Clear and Coherent and Normal Rate  Volume:  Normal  Mood:  Euthymic  Affect:  Full Range  Thought Process:  Goal Directed and Descriptions of Associations: Intact  Orientation:  Full (Time, Place, and Person)  Thought Content:  Logical  Suicidal Thoughts:  No  Homicidal Thoughts:  No  Memory:   Immediate;   Fair  Judgement:  Good  Insight:  Good  Psychomotor Activity:  Normal  Concentration:  Concentration: Fair  Recall:  Fair  Fund of Knowledge:  Good  Language:  Good  Akathisia:  No  Handed:  Right  AIMS (if indicated):     Assets:  Communication Skills Desire for Improvement Financial Resources/Insurance Housing Intimacy Resilience Social Support Talents/Skills Transportation Vocational/Educational  ADL's:  Intact  Cognition:  WNL  Sleep:   good      Screenings: ECT-MADRS     ECT Treatment from 09/07/2018 in [facility] ECT Treatment from 08/31/2018 in [facility]  MADRS Total Score  22  40    Mini-Mental     ECT Treatment from 09/07/2018 in [facility] ECT Treatment from 08/31/2018 in [facility]  Total Score (max 30 points )  30  30    PHQ2-9     Office Visit from 09/10/2018 in [facility] Office Visit from 12/22/2015 in [facility]  PHQ-2 Total Score  0  0  PHQ-9 Total Score  0  -      I reviewed the information below on 10/15/2018 and have updated it Assessment and Plan: Bipolar II d/o; Panic 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; GAD; Insomnia; rule out ADHD  Given Treyven's report of long-term issues with focus and concentration I do wonder if he potentially had ADHD that was undiagnosed.   Medication management with supportive therapy. Risks and benefits, side effects and alternative treatment options discussed with patient.  Pt was given an opportunity to ask questions about medication, illness, and treatment. All current psychiatric medications have been reviewed and discussed with the patient and adjusted as clinically appropriate. The patient has been provided an accurate and updated list of the medications being now prescribed. Patient expressed understanding of how  their medications were to be used. Pt verbalized understanding and verbal consent obtained for treatment.  Status of current problems: Significantly improved  Meds: d/cLithobid-Will restart if needed Depakote ER 250mg  po qAM and 500mg  po qHS for Bipolar disorder Buspar 15mg  po BID for GAD Ativan 1mg  po TID prn anxiety for GAD Effexor XR 225mg  po qD for depression, GAD and panic attacks Start Strattera 25mg  po BID for focus Cialis 5mg  po prn sexual activity- pt reports it is effective and he denies SE  Pt completed 6 treatments of ECT and reports that his depression has significantly improved. We discussed potentially needing maintence ECT in the future if depression returns  Labs: None today   Therapy: brief supportive therapy provided. Discussed psychosocial stressors in detail.   Consultations: none  Pt reports he is no longer experiencing SI.  He is at an acute low risk of suicide.  He has no prior hx of suicide attempts. He has + social support, responsibility towards family and compliance with meds, he is employed and denies substance abuse. Negative factors include strong family hx of mental illness. Patient told to call clinic if any problems occur. Patient advised to go to ER if they should develop new SI/HI, side effects, or if symptoms worsen. Has crisis numbers to call if needed.   F/up in4 weekssooner if needed-Dejan feels that he is emotionally more stable but will schedule an earlier appointment if needed  The duration of this appointment visit was 30 minutes of face-to-face time with the patient.  Greater than 50% of this time was spent in counseling, explanation of  diagnosis, planning of further management, and coordination of care  [name], MD 10/15/2018, 10:06 AM

## 2018-11-06 ENCOUNTER — Other Ambulatory Visit (HOSPITAL_COMMUNITY): Payer: Self-pay | Admitting: Psychiatry

## 2018-11-06 MED FILL — ATOMOXETINE HCL 25 MG CAPS: 25 | 30 days supply | Qty: 60 | Fill #1

## 2018-11-06 MED FILL — DIVALPROEX SOD ER 250 MG TA: 250 | 30 days supply | Qty: 90 | Fill #1

## 2018-11-06 MED FILL — VENLAFAXINE HCL ER 75 MG CA: 75 | 30 days supply | Qty: 90 | Fill #1

## 2018-11-06 MED FILL — busPIRone HCL 15 MG TABS: 15 | 30 days supply | Qty: 60 | Fill #1

## 2018-11-12 ENCOUNTER — Other Ambulatory Visit: Payer: Self-pay

## 2018-11-12 ENCOUNTER — Ambulatory Visit (HOSPITAL_COMMUNITY): Payer: 59 | Admitting: Psychiatry

## 2018-11-12 NOTE — Progress Notes (Unsigned)
Virtual Visit via Telephone Note  I connected with Matthew [name] on 11/12/18 at  7:30 AM EDT by telephone and verified that I am speaking with the correct person using two identifiers.   I discussed the limitations, risks, security and privacy concerns of performing an evaluation and management service by telephone and the availability of in person appointments. I also discussed with the patient that there may be a patient responsible charge related to this service. The patient expressed understanding and agreed to proceed.   History of Present Illness:    Observations/Objective:   Assessment and Plan:   Follow Up Instructions:    I discussed the assessment and treatment plan with the patient. The patient was provided an opportunity to ask questions and all were answered. The patient agreed with the plan and demonstrated an understanding of the instructions.   The patient was advised to call back or seek an in-person evaluation if the symptoms worsen or if the condition fails to improve as anticipated.  I provided *** minutes of non-face-to-face time during this encounter.   [name], MD

## 2018-11-26 ENCOUNTER — Other Ambulatory Visit: Payer: Self-pay

## 2018-11-26 ENCOUNTER — Telehealth (HOSPITAL_COMMUNITY): Payer: Self-pay

## 2018-11-26 ENCOUNTER — Ambulatory Visit (HOSPITAL_COMMUNITY): Payer: 59 | Admitting: Psychiatry

## 2018-11-26 ENCOUNTER — Encounter (HOSPITAL_COMMUNITY): Payer: Self-pay | Admitting: Psychiatry

## 2018-11-26 DIAGNOSIS — F3181 Bipolar II disorder: Secondary | ICD-10-CM

## 2018-11-26 DIAGNOSIS — R37 Sexual dysfunction, unspecified: Secondary | ICD-10-CM

## 2018-11-26 DIAGNOSIS — F410 Panic disorder [episodic paroxysmal anxiety] without agoraphobia: Secondary | ICD-10-CM

## 2018-11-26 DIAGNOSIS — F411 Generalized anxiety disorder: Secondary | ICD-10-CM

## 2018-11-26 MED ORDER — TADALAFIL 5 MG PO TABS: 5.0000 mg | ORAL_TABLET | Freq: Every day | ORAL | 1 refills | Status: DC | PRN

## 2018-11-26 MED ORDER — BUSPIRONE HCL 15 MG PO TABS: 15.0000 mg | ORAL_TABLET | Freq: Two times a day (BID) | ORAL | 0 refills | Status: DC

## 2018-11-26 MED ORDER — DIVALPROEX SODIUM ER 250 MG PO TB24: ORAL_TABLET | ORAL | 0 refills | Status: DC

## 2018-11-26 MED ORDER — VENLAFAXINE HCL ER 75 MG PO CP24: 225.0000 mg | ORAL_CAPSULE | Freq: Every day | ORAL | 0 refills | Status: DC

## 2018-11-26 MED ORDER — LORAZEPAM 1 MG PO TABS: 1.0000 mg | ORAL_TABLET | Freq: Three times a day (TID) | ORAL | 2 refills | Status: DC | PRN

## 2018-11-26 MED ORDER — ATOMOXETINE HCL 25 MG PO CAPS: 25.0000 mg | ORAL_CAPSULE | Freq: Two times a day (BID) | ORAL | 0 refills | Status: DC

## 2018-11-26 NOTE — Telephone Encounter (Signed)
Medication management - Prior authorization for patient's continued need for Cialis medication completed online with CoverMyMeds and request faxed to patient's [employer] through CoverMyMeds.  May be up to 5 days before decision.  Printed off PA submitted

## 2018-11-26 NOTE — Progress Notes (Unsigned)
Virtual Visit via Telephone Note  I connected with Matthew [name] on 11/26/18 at  8:00 AM EDT by telephone and verified that I am speaking with the correct person using two identifiers.   I discussed the limitations, risks, security and privacy concerns of performing an evaluation and management service by telephone and the availability of in person appointments. I also discussed with the patient that there may be a patient responsible charge related to this service. The patient expressed understanding and agreed to proceed.   History of Present Illness:    Observations/Objective:   Assessment and Plan:   Follow Up Instructions:    I discussed the assessment and treatment plan with the patient. The patient was provided an opportunity to ask questions and all were answered. The patient agreed with the plan and demonstrated an understanding of the instructions.   The patient was advised to call back or seek an in-person evaluation if the symptoms worsen or if the condition fails to improve as anticipated.  I provided *** minutes of non-face-to-face time during this encounter.   [name], MD

## 2018-11-30 DIAGNOSIS — K1230 Oral mucositis (ulcerative), unspecified: Secondary | ICD-10-CM | POA: Diagnosis not present

## 2018-12-15 ENCOUNTER — Encounter: Payer: Self-pay | Admitting: Physician Assistant

## 2018-12-15 ENCOUNTER — Telehealth: Payer: Self-pay

## 2018-12-15 ENCOUNTER — Other Ambulatory Visit: Payer: Self-pay

## 2018-12-15 ENCOUNTER — Ambulatory Visit (INDEPENDENT_AMBULATORY_CARE_PROVIDER_SITE_OTHER): Payer: 59 | Admitting: Physician Assistant

## 2018-12-15 VITALS — BP 130/70 | HR 90 | Wt 225.0 lb

## 2018-12-15 DIAGNOSIS — B001 Herpesviral vesicular dermatitis: Secondary | ICD-10-CM

## 2018-12-15 DIAGNOSIS — R197 Diarrhea, unspecified: Secondary | ICD-10-CM | POA: Diagnosis not present

## 2018-12-15 MED ORDER — VALACYCLOVIR HCL 1 G PO TABS: 2000.0000 mg | ORAL_TABLET | Freq: Two times a day (BID) | ORAL | 0 refills | Status: DC

## 2018-12-15 MED FILL — valACYclovir HCL 1 GM TABS: 1 | 1 days supply | Qty: 4 | Fill #0

## 2018-12-15 MED FILL — LORazepam 1 MG TABS: 1 | 30 days supply | Qty: 90 | Fill #0

## 2018-12-15 MED FILL — TADALAFIL 5 MG TABS: 5 | 10 days supply | Qty: 10 | Fill #0

## 2018-12-15 NOTE — Progress Notes (Signed)
Virtual Visit via Video   I connected with patient on 12/15/18 at  3:20 PM EDT by a video enabled telemedicine application and verified that I am speaking with the correct person using two identifiers.  Location patient: Work Location provider: Acupuncturist, Office Persons participating in the virtual visit: Patient, Provider, [place] ([place])  I discussed the limitations of evaluation and management by telemedicine and the availability of in person appointments. The patient expressed understanding and agreed to proceed.  Subjective:   HPI:   Patient presents via [employer] today c/o 3 weeks of non-bloody diarrhea. Patient notes 4 weeks ago having pain in his mouth with ulceration. Was seen at an outside urgent care and notes he was diagnosed with stomatitis. Notes he was given mouth rinse and a prescription for Clindamycin that he took a few times per day for 7 days. Notes in that time he started to develop loose stools that worsened over the subsequent couple of weeks. Noted about 6 loose stools daily. Denies ay abdominal pain, nausea or vomiting during that time. Notes he has tried to keep well-hydrated but it "comes right out the other end". Denies change to urinary habits or any noted dark urine. Does not some occasional lightheadedness with standing quickly. Denies SOB at rest or on exertion. Does note some mouth dryness. Notes recurrence of cold sore x 1 days. Does note he was not given any antiviral medications during original assessment.   ROS:   See pertinent positives and negatives per HPI.  Patient Active Problem List   Diagnosis Date Noted  . Bipolar 2 disorder ([place]) 12/08/2014  . Bipolar II disorder ([place]) 07/07/2014  . Panic 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 07/07/2014  . Bipolar II disorder, most recent episode hypomanic ([place]) 07/07/2014  . Bipolar 2 disorder, major depressive episode ([place]) 02/03/2014  . Panic disorder 02/03/2014  . Low back pain 05/28/2012  .  Scoliosis of thoracic spine 03/12/2012  . Back pain, thoracic 03/12/2012  . Other abnormal glucose 03/12/2012  . Nonspecific elevation of levels of transaminase or lactic acid dehydrogenase (LDH) 02/02/2012  . Tobacco abuse 01/26/2012  . Asthma 01/26/2012  . Routine general medical examination at a health care facility 01/21/2011    Social History   Tobacco Use  . Smoking status: Current Every Day Smoker    Packs/day: 1.00    Years: 20.00    Pack years: 20.00    Types: Cigarettes  . Smokeless tobacco: Never Used  Substance Use Topics  . Alcohol use: No    Comment: Occasionally on holidays.    Current Outpatient Medications:  .  atomoxetine (STRATTERA) 25 MG capsule, Take 1 capsule (25 mg total) by mouth 2 (two) times daily., Disp: 180 capsule, Rfl: 0 .  baclofen (LIORESAL) 10 MG tablet, Take 10 mg by mouth as needed for muscle spasms., Disp: , Rfl:  .  busPIRone (BUSPAR) 15 MG tablet, Take 1 tablet (15 mg total) by mouth 2 (two) times daily., Disp: 180 tablet, Rfl: 0 .  celecoxib (CELEBREX) 100 MG capsule, Take 100 mg by mouth as needed for mild pain. , Disp: , Rfl:  .  cyclobenzaprine (FLEXERIL) 10 MG tablet, Take 1 tablet (10 mg total) by mouth 3 (three) times daily as needed. (Patient taking differently: Take 10 mg by mouth 3 (three) times daily as needed for muscle spasms. ), Disp: 30 tablet, Rfl: 0 .  divalproex (DEPAKOTE ER) 250 MG 24 hr tablet, Take one tab po qAM and 2 tabs po qPM,  Disp: 270 tablet, Rfl: 0 .  LORazepam (ATIVAN) 1 MG tablet, Take 1 tablet (1 mg total) by mouth 3 (three) times daily as needed for anxiety., Disp: 90 tablet, Rfl: 2 .  tadalafil (CIALIS) 5 MG tablet, Take 1 tablet (5 mg total) by mouth daily as needed for erectile dysfunction., Disp: 10 tablet, Rfl: 1 .  venlafaxine XR (EFFEXOR-XR) 75 MG 24 hr capsule, Take 3 capsules (225 mg total) by mouth daily with breakfast., Disp: 270 capsule, Rfl: 0  Allergies  Allergen Reactions  . Ciprofloxacin      Acute kidney failure    Objective:   BP 130/70   Pulse 90   Wt 225 lb (102.1 kg)   BMI 26.68 kg/m   Patient is well-developed, well-nourished in no acute distress.  Resting comfortably in chair at desk.  Head is normocephalic, atraumatic.  No labored breathing.  Speech is clear and coherent with logical contest.  Patient is alert and oriented at baseline.  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 Diarrhea of presumed infectious origin Concern for c.diff due to associated clindamycin use. No records for review but find odd that he would be given antibiotic for stomatitis as this is usually viral or inflammatory in nature. Stool frequency has slowed down to about 4 loose BM daily. No fever, SOB, Abd pain, N/V. Is able to tolerate PO fluids. Will check CBC, CMP, Stool culture and c diff study. Start daily probiotic and bland diet. Will start further treatment based on studies. Continue gentle hydration -- water and G2 gatorade. Strict ER precautions reviewed with patient. .   - CBC w/Diff; Future - Comp Met (CMET); Future - Stool Culture; Future - Clostridium difficile Toxin B, Qualitative, Real-Time PCR([facility]); Future  2. Cold sore Recurrent x 1 day. Likely related to physical and mental stress recently. Start Valtrex as directed.  - valACYclovir (VALTREX) 1000 MG tablet; Take 2 tablets (2,000 mg total) by mouth 2 (two) times daily.  Dispense: 4 tablet; Refill: 0    Leeanne Rio, [place] 12/15/2018

## 2018-12-15 NOTE — Patient Instructions (Signed)
Instructions sent to MyChart.  Please keep hydrated and try to get plenty of rest.  Consider starting a daily probiotic. Follow the dietary recommendations below.  Do not forget to go by the lab in the morning for blood work and stool testing.  Will call with results as soon as they are available. Start the Valtrex as directed for the cold sore.   If you note any acutely worsening symptoms or are unable to keep fluids in, please go to the ER for IV fluids and assessment.   Hang in there!   Food Choices to Help Relieve Diarrhea, Adult When you have diarrhea, the foods you eat and your eating habits are very important. Choosing the right foods and drinks can help:  Relieve diarrhea.  Replace lost fluids and nutrients.  Prevent dehydration. What general guidelines should I follow?  Relieving diarrhea  Choose foods with less than 2 g or .07 oz. of fiber per serving.  Limit fats to less than 8 tsp (38 g or 1.34 oz.) a day.  Avoid the following: ? Foods and beverages sweetened with high-fructose corn syrup, honey, or sugar alcohols such as xylitol, sorbitol, and mannitol. ? Foods that contain a lot of fat or sugar. ? Fried, greasy, or spicy foods. ? High-fiber grains, breads, and cereals. ? Raw fruits and vegetables.  Eat foods that are rich in probiotics. These foods include dairy products such as yogurt and fermented milk products. They help increase healthy bacteria in the stomach and intestines (gastrointestinal tract, or GI tract).  If you have lactose intolerance, avoid dairy products. These may make your diarrhea worse.  Take medicine to help stop diarrhea (antidiarrheal medicine) only as told by your health care provider. Replacing nutrients  Eat small meals or snacks every 3-4 hours.  Eat bland foods, such as white rice, toast, or baked potato, until your diarrhea starts to get better. Gradually reintroduce nutrient-rich foods as tolerated or as told by your health  care provider. This includes: ? Well-cooked protein foods. ? Peeled, seeded, and soft-cooked fruits and vegetables. ? Low-fat dairy products.  Take vitamin and mineral supplements as told by your health care provider. Preventing dehydration  Start by sipping water or a special solution to prevent dehydration (oral rehydration solution, ORS). Urine that is clear or pale yellow means that you are getting enough fluid.  Try to drink at least 8-10 cups of fluid each day to help replace lost fluids.  You may add other liquids in addition to water, such as clear juice or decaffeinated sports drinks, as tolerated or as told by your health care provider.  Avoid drinks with caffeine, such as coffee, tea, or soft drinks.  Avoid alcohol. What foods are recommended?     The items listed may not be a complete list. Talk with your health care provider about what dietary choices are best for you. Grains White rice. White, JamaicaFrench, or pita breads (fresh or toasted), including plain rolls, buns, or bagels. White pasta. Saltine, soda, or graham crackers. Pretzels. Low-fiber cereal. Cooked cereals made with water (such as cornmeal, farina, or cream cereals). Plain muffins. Matzo. Melba toast. Zwieback. Vegetables Potatoes (without the skin). Most well-cooked and canned vegetables without skins or seeds. Tender lettuce. Fruits Apple sauce. Fruits canned in juice. Cooked apricots, cherries, grapefruit, peaches, pears, or plums. Fresh bananas and cantaloupe. Meats and other protein foods Baked or boiled chicken. Eggs. Tofu. Fish. Seafood. Smooth nut butters. Ground or well-cooked tender beef, ham, veal, lamb, pork, or  poultry. Dairy Plain yogurt, kefir, and unsweetened liquid yogurt. Lactose-free milk, buttermilk, skim milk, or soy milk. Low-fat or nonfat hard cheese. Beverages Water. Low-calorie sports drinks. Fruit juices without pulp. Strained tomato and vegetable juices. Decaffeinated teas. Sugar-free  beverages not sweetened with sugar alcohols. Oral rehydration solutions, if approved by your health care provider. Seasoning and other foods Bouillon, broth, or soups made from recommended foods. What foods are not recommended? The items listed may not be a complete list. Talk with your health care provider about what dietary choices are best for you. Grains Whole grain, whole wheat, bran, or rye breads, rolls, pastas, and crackers. Wild or brown rice. Whole grain or bran cereals. Barley. Oats and oatmeal. Corn tortillas or taco shells. Granola. Popcorn. Vegetables Raw vegetables. Fried vegetables. Cabbage, broccoli, Brussels sprouts, artichokes, baked beans, beet greens, corn, kale, legumes, peas, sweet potatoes, and yams. Potato skins. Cooked spinach and cabbage. Fruits Dried fruit, including raisins and dates. Raw fruits. Stewed or dried prunes. Canned fruits with syrup. Meat and other protein foods Fried or fatty meats. Deli meats. Chunky nut butters. Nuts and seeds. Beans and lentils. Tomasa Blase. Hot dogs. Sausage. Dairy High-fat cheeses. Whole milk, chocolate milk, and beverages made with milk, such as milk shakes. Half-and-half. Cream. sour cream. Ice cream. Beverages Caffeinated beverages (such as coffee, tea, soda, or energy drinks). Alcoholic beverages. Fruit juices with pulp. Prune juice. Soft drinks sweetened with high-fructose corn syrup or sugar alcohols. High-calorie sports drinks. Fats and oils Butter. Cream sauces. Margarine. Salad oils. Plain salad dressings. Olives. Avocados. Mayonnaise. Sweets and desserts Sweet rolls, doughnuts, and sweet breads. Sugar-free desserts sweetened with sugar alcohols such as xylitol and sorbitol. Seasoning and other foods Honey. Hot sauce. Chili powder. Gravy. Cream-based or milk-based soups. Pancakes and waffles. Summary  When you have diarrhea, the foods you eat and your eating habits are very important.  Make sure you get at least 8-10 cups  of fluid each day, or enough to keep your urine clear or pale yellow.  Eat bland foods and gradually reintroduce healthy, nutrient-rich foods as tolerated, or as told by your health care provider.  Avoid high-fiber, fried, greasy, or spicy foods. This information is not intended to replace advice given to you by your health care provider. Make sure you discuss any questions you have with your health care provider. Document Released: 10/26/2003 Document Revised: 08/02/2016 Document Reviewed: 08/02/2016 Elsevier Interactive Patient Education  2019 [employer].

## 2018-12-15 NOTE — Progress Notes (Signed)
I have discussed the procedure for the virtual visit with the patient who has given consent to proceed with assessment and treatment.   Matthew [name], CMA     

## 2018-12-15 NOTE — Telephone Encounter (Signed)
Copied from CRM [id number]. Topic: Appointment Scheduling - Scheduling Inquiry for Clinic >> Dec 15, 2018  9:28 AM Jolayne Haines L wrote: Reason for CRM: pt would like to set up an appt with Dr Beverely Low. Symptoms : Diarrhea , dehydrated and mouth issues. Number went to VM. Please call pt at [phone]

## 2018-12-16 ENCOUNTER — Other Ambulatory Visit (INDEPENDENT_AMBULATORY_CARE_PROVIDER_SITE_OTHER): Payer: 59

## 2018-12-16 DIAGNOSIS — R197 Diarrhea, unspecified: Secondary | ICD-10-CM | POA: Diagnosis not present

## 2018-12-16 LAB — COMPREHENSIVE METABOLIC PANEL
ALT: 46 U/L (ref 0–53)
AST: 19 U/L (ref 0–37)
Albumin: 4.7 g/dL (ref 3.5–5.2)
Alkaline Phosphatase: 65 U/L (ref 39–117)
BUN: 13 mg/dL (ref 6–23)
CO2: 26 mEq/L (ref 19–32)
Calcium: 9.3 mg/dL (ref 8.4–10.5)
Chloride: 104 mEq/L (ref 96–112)
Creatinine, Ser: 0.88 mg/dL (ref 0.40–1.50)
GFR: 92.45 mL/min (ref >60.00)
Glucose, Bld: 101 mg/dL — ABNORMAL HIGH (ref 70–99)
Potassium: 3.9 mEq/L (ref 3.5–5.1)
Sodium: 138 mEq/L (ref 135–145)
Total Bilirubin: 0.5 mg/dL (ref 0.2–1.2)
Total Protein: 7.3 g/dL (ref 6.0–8.3)

## 2018-12-16 LAB — CBC WITH DIFFERENTIAL/PLATELET
Basophils Absolute: 0 10*3/uL (ref 0.0–0.1)
Basophils Relative: 0.6 % (ref 0.0–3.0)
Eosinophils Absolute: 0.2 10*3/uL (ref 0.0–0.7)
Eosinophils Relative: 2.5 % (ref 0.0–5.0)
HCT: 47 % (ref 39.0–52.0)
Hemoglobin: 16.5 g/dL (ref 13.0–17.0)
Lymphocytes Relative: 30.1 % (ref 12.0–46.0)
Lymphs Abs: 2.5 10*3/uL (ref 0.7–4.0)
MCHC: 35.2 g/dL (ref 30.0–36.0)
MCV: 90.2 fl (ref 78.0–100.0)
Monocytes Absolute: 0.5 10*3/uL (ref 0.1–1.0)
Monocytes Relative: 5.7 % (ref 3.0–12.0)
Neutro Abs: 5 10*3/uL (ref 1.4–7.7)
Neutrophils Relative %: 61.1 % (ref 43.0–77.0)
Platelets: 206 10*3/uL (ref 150.0–400.0)
RBC: 5.21 Mil/uL (ref 4.22–5.81)
RDW: 13.2 % (ref 11.5–15.5)
WBC: 8.2 10*3/uL (ref 4.0–10.5)

## 2018-12-18 ENCOUNTER — Encounter: Payer: Self-pay | Admitting: Physician Assistant

## 2018-12-20 LAB — STOOL CULTURE
MICRO NUMBER:: 431845
MICRO NUMBER:: 431846
MICRO NUMBER:: 431847
SHIGA RESULT:: NOT DETECTED
SPECIMEN QUALITY:: ADEQUATE
SPECIMEN QUALITY:: ADEQUATE
SPECIMEN QUALITY:: ADEQUATE

## 2018-12-20 LAB — CLOSTRIDIUM DIFFICILE TOXIN B, QUALITATIVE, REAL-TIME PCR: Toxigenic C. Difficile by PCR: NOT DETECTED

## 2018-12-24 ENCOUNTER — Other Ambulatory Visit: Payer: Self-pay

## 2018-12-24 ENCOUNTER — Ambulatory Visit (HOSPITAL_COMMUNITY): Payer: 59 | Admitting: Psychiatry

## 2018-12-24 ENCOUNTER — Encounter (HOSPITAL_COMMUNITY): Payer: Self-pay | Admitting: Psychiatry

## 2018-12-24 DIAGNOSIS — F411 Generalized anxiety disorder: Secondary | ICD-10-CM

## 2018-12-24 DIAGNOSIS — F410 Panic disorder [episodic paroxysmal anxiety] without agoraphobia: Secondary | ICD-10-CM

## 2018-12-24 DIAGNOSIS — F3181 Bipolar II disorder: Secondary | ICD-10-CM

## 2018-12-24 MED FILL — DIVALPROEX SOD ER 250 MG TA: 250 | 90 days supply | Qty: 270 | Fill #0

## 2018-12-24 MED FILL — ATOMOXETINE HCL 25 MG CAPS: 25 | 90 days supply | Qty: 180 | Fill #0

## 2018-12-24 MED FILL — VENLAFAXINE HCL ER 75 MG CA: 75 | 90 days supply | Qty: 270 | Fill #0

## 2018-12-24 MED FILL — busPIRone HCL 15 MG TABS: 15 | 90 days supply | Qty: 180 | Fill #0

## 2018-12-24 NOTE — Progress Notes (Unsigned)
Virtual Visit via Telephone Note  I connected with Matthew [name] on 12/24/18 at  8:30 AM EDT by telephone and verified that I am speaking with the correct person using two identifiers.  Location: Patient: home Provider: home   I discussed the limitations, risks, security and privacy concerns of performing an evaluation and management service by telephone and the availability of in person appointments. I also discussed with the patient that there may be a patient responsible charge related to this service. The patient expressed understanding and agreed to proceed.    12/24/2018 8:48 AM Matthew [name]  MRN:  [id number]  Chief Complaint:  Chief Complaint    Anxiety; Follow-up     HPI:    ***Daivd tells me that his mood is been stable.  He has been able to handle stress a little bit better.  His sleep is good.  He is denying any SI/HI.  He is denying any manic or hypomanic-like symptoms.  Ramesses is experiencing less irritability although it is not completely gone.  He continues to experience anxiety during the day.  He takes half a tablet of Klonopin in the morning and that seems to help make it more manageable.  He is actively working on self-care.  Raylin has decided to take Friday evening off and Saturday and Sunday morning off.  He is attempting to adjust his Sunday night schedule so that he can relax before going to work on Monday.  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  He has been trying to work on it but finds that he is unable to focus.  As we talked about it some more he shared that he has always had issues with focus and procrastination well in school.  He would complete assignments but they would not be of good quality.  Occasionally he would come across some material that he enjoyed and would not find it difficult to complete any assignments related to it.  While in school he states that no one ever mentioned the  possibility of ADHD to him.  Today he tells me that he is often easily distracted.  He will start projects but gets sidetracked and not complete them in a timely fashion.  He often feels overwhelmed during the day with the number of meetings that he has and the number of staff who come to talk to him.  During times that he is alone he does attempt to finish an assignment as quickly as possible.  Visit Diagnosis:    ICD-10-CM   1. Bipolar 2 disorder (HCC) F31.81   2. GAD (generalized anxiety disorder) F41.1   3. Panic 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 F41.0     Past Psychiatric History:  Anxiety:Yes Bipolar Disorder:No Depression:Yes Mania:No Psychosis:No Schizophrenia:No Personality Disorder:No Hospitalization for psychiatric illness:No History of Electroconvulsive Shock Therapy:No Prior Suicide Attempts:No    Past Medical History:  Past Medical History:  Diagnosis Date  . Acute kidney failure (HCC) 01/2012  . Anxiety   . Bipolar disorder (HCC)    hypomania  . COPD (chronic obstructive pulmonary disease) (HCC)   . Depression   . Low back pain     Past Surgical History:  Procedure Laterality Date  . NO PAST SURGERIES      Family Psychiatric History:  Family History  Problem Relation Age of Onset  . Anxiety disorder Mother   . Alcohol abuse Father   . Drug abuse Father   .  Bipolar disorder Sister   . Alcohol abuse Brother   . Drug abuse Brother   . Bipolar disorder Paternal Aunt   . Schizophrenia Paternal Aunt   . Cancer Maternal Grandmother        lung  . Hypertension Maternal Grandfather   . Cancer Paternal Grandmother        lung  . Hypertension Paternal Grandfather   . Diabetes Neg Hx   . Heart disease Neg Hx   . Hyperlipidemia Neg Hx   . Stroke Neg Hx   . Suicidality Neg Hx     Social History:  Social History   Socioeconomic History  . Marital status: Married    Spouse name: Not on file  . Number of children: Not on file  . Years of  education: Not on file  . Highest education level: Not on file  Occupational History  . Not on file  Social Needs  . Financial resource strain: Not on file  . Food insecurity:    Worry: Not on file    Inability: Not on file  . Transportation needs:    Medical: Not on file    Non-medical: Not on file  Tobacco Use  . Smoking status: Current Every Day Smoker    Packs/day: 1.00    Years: 20.00    Pack years: 20.00    Types: Cigarettes  . Smokeless tobacco: Never Used  Substance and Sexual Activity  . Alcohol use: No    Comment: Occasionally on holidays.  . Drug use: No  . Sexual activity: Yes    Partners: Female    Birth control/protection: None  Lifestyle  . Physical activity:    Days per week: Not on file    Minutes per session: Not on file  . Stress: Not on file  Relationships  . Social connections:    Talks on phone: Not on file    Gets together: Not on file    Attends religious service: Not on file    Active member of club or organization: Not on file    Attends meetings of clubs or organizations: Not on file    Relationship status: Not on file  Other Topics Concern  . Not on file  Social History Narrative  . Not on file    Allergies:  Allergies  Allergen Reactions  . Ciprofloxacin     Acute kidney failure    Metabolic Disorder Labs: Lab Results  Component Value Date   HGBA1C 5.2 03/12/2018   MPG 103 02/03/2014   Lab Results  Component Value Date   PROLACTIN 15.7 (H) 03/12/2018   PROLACTIN 6.4 04/24/2017   Lab Results  Component Value Date   CHOL 155 03/12/2018   TRIG 360 (H) 03/12/2018   HDL 26 (L) 03/12/2018   CHOLHDL 5 01/21/2011   VLDL 34.8 01/21/2011   LDLCALC 57 03/12/2018   LDLCALC 50 04/24/2017   Lab Results  Component Value Date   TSH 1.850 03/12/2018   TSH 2.020 04/24/2017    Therapeutic Level Labs: Lab Results  Component Value Date   LITHIUM 0.6 03/12/2018   LITHIUM 0.9 04/24/2017   Lab Results  Component Value Date    VALPROATE 34 (L) 03/12/2018   VALPROATE 46 (L) 04/24/2017   No components found for:  CBMZ  Current Medications: Current Outpatient Medications  Medication Sig Dispense Refill  . atomoxetine (STRATTERA) 25 MG capsule Take 1 capsule (25 mg total) by mouth 2 (two) times daily. 180 capsule 0  .  baclofen (LIORESAL) 10 MG tablet Take 10 mg by mouth as needed for muscle spasms.    . busPIRone (BUSPAR) 15 MG tablet Take 1 tablet (15 mg total) by mouth 2 (two) times daily. 180 tablet 0  . celecoxib (CELEBREX) 100 MG capsule Take 100 mg by mouth as needed for mild pain.     . cyclobenzaprine (FLEXERIL) 10 MG tablet Take 1 tablet (10 mg total) by mouth 3 (three) times daily as needed. (Patient taking differently: Take 10 mg by mouth 3 (three) times daily as needed for muscle spasms. ) 30 tablet 0  . divalproex (DEPAKOTE ER) 250 MG 24 hr tablet Take one tab po qAM and 2 tabs po qPM 270 tablet 0  . LORazepam (ATIVAN) 1 MG tablet Take 1 tablet (1 mg total) by mouth 3 (three) times daily as needed for anxiety. 90 tablet 2  . tadalafil (CIALIS) 5 MG tablet Take 1 tablet (5 mg total) by mouth daily as needed for erectile dysfunction. 10 tablet 1  . venlafaxine XR (EFFEXOR-XR) 75 MG 24 hr capsule Take 3 capsules (225 mg total) by mouth daily with breakfast. 270 capsule 0  . valACYclovir (VALTREX) 1000 MG tablet Take 2 tablets (2,000 mg total) by mouth 2 (two) times daily. (Patient not taking: Reported on 12/24/2018) 4 tablet 0   No current facility-administered medications for this visit.     Psychiatric Specialty Exam:   There were no vitals taken for this visit.There is no height or weight on file to calculate BMI.  General Appearance: {Appearance:22683}  Eye Contact:  {BHH EYE CONTACT:22684}  Speech:  {Speech:22685}  Volume:  {Volume (PAA):22686}  Mood:  {BHH MOOD:22306}  Affect:  {Affect (PAA):22687}  Thought Process:  {Thought Process (PAA):22688}  Orientation:  {BHH ORIENTATION (PAA):22689}   Thought Content:  {Thought Content:22690}  Suicidal Thoughts:  {ST/HT (PAA):22692}  Homicidal Thoughts:  {ST/HT (PAA):22692}  Memory:  {BHH MEMORY:22881}  Judgement:  {Judgement (PAA):22694}  Insight:  {Insight (PAA):22695}  Psychomotor Activity:  {Psychomotor (PAA):22696}  Concentration:  {Concentration:21399}  Recall:  {BHH GOOD/FAIR/POOR:22877}  Fund of Knowledge:  {BHH GOOD/FAIR/POOR:22877}  Language:  {BHH GOOD/FAIR/POOR:22877}  Akathisia:  {BHH YES OR NO:22294}  Handed:  {Handed:22697}  AIMS (if indicated):     Assets:  {Assets (PAA):22698}  ADL's:  {BHH ZOX'W:96045}ADL'S:22290}  Cognition:  {chl bhh cognition:304700322}  Sleep:           Screenings: ECT-MADRS     [facility]  MADRS Total Score  22  40    Mini-Mental     ECT [facility]07/2018 [facility]  Total Score (max 30 points )  30  30    PHQ2-9     Office Visit from 09/10/2018 in [facility] Office [facility]  PHQ-2 Total Score  0  0  PHQ-9 Total Score  0  -      I reviewed the information below on Dec 24, 2018 and have updated it Assessment and Plan: Bipolar II d/o; Panic 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; GAD; Insomnia; rule out ADHD  Given Akai's report of long-term issues with focus and concentration I do wonder if he potentially had ADHD that was undiagnosed.  Medication management with supportive therapy. Risks and benefits, side effects and  alternative treatment options discussed with patient. Pt was given an opportunity to ask questions about medication, illness, and treatment. All current psychiatric medications have been reviewed and discussed with the patient and  adjusted as clinically appropriate. The patient has been provided an accurate and updated list of the medications being now prescribed. Patient expressed understanding of how their medications were to be used. Pt verbalized understanding and verbal consent obtained for treatment.  Status of current problems: Continues to be stable  Meds: Depakote ER 250mg  po qAM and 500mg  po qHS for Bipolar disorder Buspar 15mg  po BID for GAD Ativan 1mg  po TID prn anxiety for GAD Effexor XR 225mg  po qD for depression, GAD and panic attacks Strattera 25mg  po BID for focus Cialis 5mg  po prn sexual activity- pt reports it is effective and he denies SE  Pt completed 6 treatments of ECT and reported that his depression had significantly improved. We discussed potentially needing maintence ECT in the future if depression returns  Labs: None today   Therapy: brief supportive therapy provided. Discussed psychosocial stressors in detail.   Consultations: none  Pt reports he is no longer experiencing SI.  He is at an acute low risk of suicide.  He has no prior hx of suicide attempts. He has + social support, responsibility towards family and compliance with meds, he is employed and denies substance abuse. Negative factors include strong family hx of mental illness. Patient told to call clinic if any problems occur. Patient advised to go to ER if they should develop new SI/HI, side effects, or if symptoms worsen. Has crisis numbers to call if needed.   F/up in 4 to 5 weeks weekssooner if needed  The duration of this appointment visit was 20 minutes of non face-to-face time with the patient.  Greater than 50% of this time was spent in counseling, explanation of  diagnosis, planning of further management, and coordination of care  [name], MD 12/24/2018, 8:48 AM

## 2018-12-29 ENCOUNTER — Encounter: Payer: Self-pay | Admitting: Physician Assistant

## 2019-01-28 ENCOUNTER — Ambulatory Visit (HOSPITAL_COMMUNITY): Payer: 59 | Admitting: Psychiatry

## 2019-02-16 ENCOUNTER — Telehealth (HOSPITAL_COMMUNITY): Payer: Self-pay

## 2019-02-16 ENCOUNTER — Ambulatory Visit (INDEPENDENT_AMBULATORY_CARE_PROVIDER_SITE_OTHER): Payer: 59 | Admitting: Psychiatry

## 2019-02-16 ENCOUNTER — Other Ambulatory Visit: Payer: Self-pay

## 2019-02-16 DIAGNOSIS — F3181 Bipolar II disorder: Secondary | ICD-10-CM

## 2019-02-16 DIAGNOSIS — F411 Generalized anxiety disorder: Secondary | ICD-10-CM | POA: Diagnosis not present

## 2019-02-16 DIAGNOSIS — F410 Panic disorder [episodic paroxysmal anxiety] without agoraphobia: Secondary | ICD-10-CM | POA: Diagnosis not present

## 2019-02-16 MED ORDER — LURASIDONE HCL 40 MG PO TABS: 40.0000 mg | ORAL_TABLET | Freq: Every day | ORAL | 0 refills | Status: DC

## 2019-02-16 MED FILL — LATUDA 40 MG TABLET: 40 | 30 days supply | Qty: 30 | Fill #0

## 2019-02-16 MED FILL — LORAZEPAM 1 MG TABS: 1 | 30 days supply | Qty: 90 | Fill #0

## 2019-02-16 MED FILL — TADALAFIL 5 MG TABS: 5 | 10 days supply | Qty: 10 | Fill #0

## 2019-02-16 NOTE — Telephone Encounter (Signed)
Discussed with Dr. Montel Culver who agreed to see patient today at 2 pm.  Called patient back who was appreciative of this time and agreed to meet through virtual session.  Patient to call back if any problems connecting for appointment.

## 2019-02-16 NOTE — Progress Notes (Addendum)
BH MD/PA/NP OP Progress Note  02/16/2019 3:22 PM Matthew [name]  MRN:  [id number] Interview was started using WebEx teleconferencing application but completed by phone once audio in application failed. II verified that I was speaking with the correct person using two identifiers. I discussed the limitations of evaluation and management by telemedicine and  the availability of in person appointments. Patient expressed understanding and agreed to proceed.  Chief Complaint: Increased depression /anxiety  HPI:  Matthew [name] is a patient of Dr. Oletta DarterSalina Agarwal whom he had seen last time on May 7th this year. He reports that he had been mildly depressed a month ago but over past two weeks or so he has been feeling depressed every day. He is not hpopeless or suicidal at this time but he has a hx of passive SI when profoundly depressed. He has tried to contact Dr. Michae KavaAgarwal but she is on a vacation until end of next week.   Matthew [name] has been severely depressed at the beginning of this year and having failed to respond to increased dose of venlafaxine (from 150 mg up to 225 mg) he eventually had a 6 ECTs in January this year. His mood improved and there was a consideration given to continue these as maintenance treatment but patient ultimately decided against it (he had severe headaches after each treatment). Matthew [name] has been diagnosed with bipolar 2 disorder (has a hx of hypomania) and panic disorder. He has a hx of passive SI while severely depressed but no hx of suicidal attempts. He tells me that increase in anxiety level typically predates increase in depression which is a case this time as well. He is prescribed buspirone 15 mg bid and lorazepam prn panic attacks. He is on a relatively low dose of divalproate (250 mg in am and 500 mg at HS) for mood stabilization. His valproic acid level on this dose, when last tested, was only 34 mcg/ml. The only recent change in his medication regimen is addition of low dose of  atomoxetine  (25 mg bid) for suspected ADHD.    Visit Diagnosis:    ICD-10-CM   1. Bipolar 2 disorder, major depressive episode (HCC)  F31.81   2. Panic 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  F41.0   3. GAD (generalized anxiety disorder)  F41.1     Past Psychiatric History: Please refer to intake H&P.  Past Medical History:  Past Medical History:  Diagnosis Date  . Acute kidney failure (HCC) 01/2012  . Anxiety   . Bipolar disorder (HCC)    hypomania  . COPD (chronic obstructive pulmonary disease) (HCC)   . Depression   . Low back pain     Past Surgical History:  Procedure Laterality Date  . NO PAST SURGERIES      Family Psychiatric History: Reviewed.  Family History:  Family History  Problem Relation Age of Onset  . Anxiety disorder Mother   . Alcohol abuse Father   . Drug abuse Father   . Bipolar disorder Sister   . Alcohol abuse Brother   . Drug abuse Brother   . Bipolar disorder Paternal Aunt   . Schizophrenia Paternal Aunt   . Cancer Maternal Grandmother        lung  . Hypertension Maternal Grandfather   . Cancer Paternal Grandmother        lung  . Hypertension Paternal Grandfather   . Diabetes Neg Hx   . Heart disease Neg Hx   . Hyperlipidemia Neg Hx   . Stroke Neg  Hx   . Suicidality Neg Hx     Social History:  Social History   Socioeconomic History  . Marital status: Married    Spouse name: Not on file  . Number of children: Not on file  . Years of education: Not on file  . Highest education level: Not on file  Occupational History  . Not on file  Social Needs  . Financial resource strain: Not on file  . Food insecurity    Worry: Not on file    Inability: Not on file  . Transportation needs    Medical: Not on file    Non-medical: Not on file  Tobacco Use  . Smoking status: Current Every Day Smoker    Packs/day: 1.00    Years: 20.00    Pack years: 20.00    Types: Cigarettes  . Smokeless tobacco: Never Used  Substance and Sexual Activity  .  Alcohol use: No    Comment: Occasionally on holidays.  . Drug use: No  . Sexual activity: Yes    Partners: Female    Birth control/protection: None  Lifestyle  . Physical activity    Days per week: Not on file    Minutes per session: Not on file  . Stress: Not on file  Relationships  . Social Herbalist on phone: Not on file    Gets together: Not on file    Attends religious service: Not on file    Active member of club or organization: Not on file    Attends meetings of clubs or organizations: Not on file    Relationship status: Not on file  Other Topics Concern  . Not on file  Social History Narrative  . Not on file    Allergies:  Allergies  Allergen Reactions  . Ciprofloxacin     Acute kidney failure    Metabolic Disorder Labs: Lab Results  Component Value Date   HGBA1C 5.2 03/12/2018   MPG 103 02/03/2014   Lab Results  Component Value Date   PROLACTIN 15.7 (H) 03/12/2018   PROLACTIN 6.4 04/24/2017   Lab Results  Component Value Date   CHOL 155 03/12/2018   TRIG 360 (H) 03/12/2018   HDL 26 (L) 03/12/2018   CHOLHDL 5 01/21/2011   VLDL 34.8 01/21/2011   LDLCALC 57 03/12/2018   LDLCALC 50 04/24/2017   Lab Results  Component Value Date   TSH 1.850 03/12/2018   TSH 2.020 04/24/2017    Therapeutic Level Labs: Lab Results  Component Value Date   LITHIUM 0.6 03/12/2018   LITHIUM 0.9 04/24/2017   Lab Results  Component Value Date   VALPROATE 34 (L) 03/12/2018   VALPROATE 46 (L) 04/24/2017   No components found for:  CBMZ  Current Medications: Current Outpatient Medications  Medication Sig Dispense Refill  . atomoxetine (STRATTERA) 25 MG capsule Take 1 capsule (25 mg total) by mouth 2 (two) times daily. 180 capsule 0  . baclofen (LIORESAL) 10 MG tablet Take 10 mg by mouth as needed for muscle spasms.    . busPIRone (BUSPAR) 15 MG tablet Take 1 tablet (15 mg total) by mouth 2 (two) times daily. 180 tablet 0  . celecoxib (CELEBREX) 100 MG  capsule Take 100 mg by mouth as needed for mild pain.     . cyclobenzaprine (FLEXERIL) 10 MG tablet Take 1 tablet (10 mg total) by mouth 3 (three) times daily as needed. (Patient taking differently: Take 10 mg by mouth 3 (three)  times daily as needed for muscle spasms. ) 30 tablet 0  . divalproex (DEPAKOTE ER) 250 MG 24 hr tablet Take one tab po qAM and 2 tabs po qPM 270 tablet 0  . LORazepam (ATIVAN) 1 MG tablet Take 1 tablet (1 mg total) by mouth 3 (three) times daily as needed for anxiety. 90 tablet 2  . lurasidone (LATUDA) 40 MG TABS tablet Take 1 tablet (40 mg total) by mouth daily with supper. 30 tablet 0  . tadalafil (CIALIS) 5 MG tablet Take 1 tablet (5 mg total) by mouth daily as needed for erectile dysfunction. 10 tablet 1  . valACYclovir (VALTREX) 1000 MG tablet Take 2 tablets (2,000 mg total) by mouth 2 (two) times daily. (Patient not taking: Reported on 12/24/2018) 4 tablet 0  . venlafaxine XR (EFFEXOR-XR) 75 MG 24 hr capsule Take 3 capsules (225 mg total) by mouth daily with breakfast. 270 capsule 0   No current facility-administered medications for this visit.      Psychiatric Specialty Exam: Review of Systems  Musculoskeletal: Positive for back pain.  Psychiatric/Behavioral: Positive for depression. The patient is nervous/anxious.   All other systems reviewed and are negative.   There were no vitals taken for this visit.There is no height or weight on file to calculate BMI.  General Appearance: Well Groomed  Eye Contact:  Good  Speech:  Clear and Coherent  Volume:  Normal  Mood:  Anxious and Depressed  Affect:  Constricted  Thought Process:  Goal Directed  Orientation:  Full (Time, Place, and Person)  Thought Content: Logical   Suicidal Thoughts:  No  Homicidal Thoughts:  No  Memory:  Immediate;   Good Recent;   Good Remote;   Good  Judgement:  Good  Insight:  Good  Psychomotor Activity:  Normal  Concentration:  Concentration: Fair  Recall:  Good  Fund of  Knowledge: Good  Language: Good  Akathisia:  Negative  Handed:  Right  AIMS (if indicated): not done  Assets:  Communication Skills Desire for Improvement Financial Resources/Insurance Housing Physical Health Social Support Vocational/Educational  ADL's:  Intact  Cognition: WNL  Sleep:  Good   Screenings: ECT-MADRS     [facility]CT [facility]  MADRS Total Score  22  40    Mini-Mental     ECT [facility]  Total Score (max 30 points )  30  30    PHQ2-9     Office Visit from 09/10/2018 in [facility] Visit from [facility]  PHQ-2 Total Score  0  0  PHQ-9 Total Score  0  -       Assessment and Plan: Patient is seen by me instead of his usual psychiatrist Dr. Oletta DarterSalina Agarwal who is currently on vacation. Patient reports worsening of depressive (and anxiety) symptoms over past two weeks or so. He is not suicidal but had SI when he became profoundly depressed at the beginning of this year and having failed to respond to increased dose of venlafaxine (from 150 mg up to 225 mg) he eventually had 6 ECTs in January this year. His mood improved and there was a consideration given to continue these as maintenance treatment but patient ultimately decided against it (he had severe  headaches after each treatment). Matthew [name] has been diagnosed with bipolar 2 disorder (has a hx of hypomania) and panic disorder. He has a hx of passive SI while severely depressed but no hx of suicidal attempts. He tells me that increase in anxiety level typically predates increase in depression which is a case this time as well. He is prescribed buspirone 15 mg bid and lorazepam prn  panic attacks. He is on a relatively low dose of divalproate (250 mg in am and 500 mg at HS) for mood stabilization. His valproic acid level on this dose, when last tested, was only 34 mcg/ml. At that time, however, Matthew [name] was also on 1200 mg of lithium which was discontinued in January this year. The only recent change in his medication regimen is addition of a low dose of atomoxetine  (25 mg bid) for suspected ADHD.   Impression: Bipolar 2 disorder, major depressive episode: GAD; Panic disorder; susp. ADHD  Plan: Patient would like to try medication adjustments but if these are not helpful is willing to consider another ECT series. After reviewing EMR and verifying past medication hx with the patient it appears that he did not yet try medications approved for bipolar depression: lurasidone, quetiapine, cariprazine or fluoxetine/olanzapine combination.   I proposed a trial of lurasidone 40 mg daily in PM while decreasing dose of venlafaxine to 150 mg daily. I am concerned about risk of mood destabilization with SNRI but at the same time it potentially can be helpful for his chronic anxiety.   Venlafaxine can also decrease the metabolism of atomoxetine and may thereby increase adverse effects of the latter. Depression can be one of those side effects (around 5-7% risk) and this is the only newly added medication in the past two months.   Given the low blood level of valproic acid it may be advisable to increase dose of Depakote ER to 1000 mg daily. However, if patient will ultimately need to undergo ECT again, it could possibly interfere with that treatment. I therefore decided not to increase Depakote dose at this time.   The plan was discussed with patient who had an opportunity to ask questions and these were all answered. I spend 25 minutes in indirect clinical contact with the patient and devoted approximately 50% of this time to discussion of treatment options and med education. Patient was also  advised to make an appointment with Dr. Michae KavaAgarwal soon after she returns from her leave of absence to evaluate response to current changes and decide on further steps.    Magdalene Patricialgierd A Leotha Westermeyer, MD 02/16/2019, 3:22 PM

## 2019-02-16 NOTE — Telephone Encounter (Signed)
Medication mangement - Telephone call with patient as he reported some worsening of symptoms over the past 2-4 weeks.  Stated he was hoping to speak to Dr. Doyne Keel or to see her this week since he was cancelled 01/28/19 but Dr. Doyne Keel is on vacation.  Patient questions if he can see another provider this week virtually or speak to one helping to cover for Dr. Doyne Keel to discuss his concerns and symptoms.  Agreed to send a message to Dr. Montel Culver with request and will need to set patient back up for follow up with Dr. Doyne Keel after and patient agreed with plan.

## 2019-02-25 ENCOUNTER — Encounter (HOSPITAL_COMMUNITY): Payer: Self-pay | Admitting: Psychiatry

## 2019-02-25 ENCOUNTER — Other Ambulatory Visit: Payer: Self-pay

## 2019-02-25 ENCOUNTER — Ambulatory Visit (INDEPENDENT_AMBULATORY_CARE_PROVIDER_SITE_OTHER): Payer: 59 | Admitting: Psychiatry

## 2019-02-25 DIAGNOSIS — F3181 Bipolar II disorder: Secondary | ICD-10-CM

## 2019-02-25 DIAGNOSIS — F410 Panic disorder [episodic paroxysmal anxiety] without agoraphobia: Secondary | ICD-10-CM

## 2019-02-25 DIAGNOSIS — F411 Generalized anxiety disorder: Secondary | ICD-10-CM

## 2019-02-25 NOTE — Progress Notes (Signed)
Virtual Visit via Telephone Note  I connected with Matthew [name] on 02/25/19 at  7:30 AM EDT by telephone and verified that I am speaking with the correct person using two identifiers.  Location: Patient: home Provider: office   I discussed the limitations, risks, security and privacy concerns of performing an evaluation and management service by telephone and the availability of in person appointments. I also discussed with the patient that there may be a patient responsible charge related to this service. The patient expressed understanding and agreed to proceed.   History of Present Illness: Pt states about 6 weeks ago he began having some depression. The depression was getting more intense and frequent. Matthew [name] became concerned due to passive SI. He was scared that his depression was going to return to prior to ECT. The anxiety was causing GI upset and palpations and fatigue. He spoke with Matthew [name] last week who decreased Effexor and started [place]. Matthew [name] repeatedly apologized for meeting with Matthew [name] and stated he was not trying to "go around you. I needed help".  Last week he took a 5 day vacation and did a lot of self care. He returned to work and by Tuesday he was having overwhelming anxiety. He was feeling down, crying, anxiety and hopelessness. He spoke with his wife. They decided that he should speak with his boss regarding Matthew [name]'s level of work and what is acceptable amount of work. They had a discussion and his boss was receptive. Matthew [name] will be handing off some things to other people. Matthew [name] is going to ask for more staff to help decrease his load. 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 He disagreed and spoke with his chair. They are meeting this afternoon to discuss what to do. Yesterday he went to work and he is anxious. His wife has been very supportive and that has helped. He is not going to quit but does feel  ok about delegating some of the work. Matthew [name] tells me yesterday he did work but didn't take on too much. He did spend time with family. On weekends he is maintaining the work/life balance. Next week he is only working 3 days. The depression is a little better. He is feeling down and tired. He is not sure he can handle work and school. He is no longer feeling hopeless or having SI. Matthew [name] is worried that it could become worse where he feels hopeless about his life. Yesterday he was feeling anxious in the evening and afternoon. He took Ativan in the evening and it helped. He has been taking Latuda for one week and is not sure yet of its effect. He has not noticed any issues with decreasing Effexor. He is taking the Strattera and it helps some. His mind is overwhelmed and he is not always staying on task. He has added a bunch of PAL for the rest of the summer. At the end of the visit Matthew [name] shared that "you are the best psychiatrist I have ever worked with. I really appreciate you and how you work with me".    Observations/Objective: I spoke with Matthew [name] on the phone.  Pt was calm, pleasant and cooperative.  Pt was engaged in the conversation and answered questions appropriately.  Speech was clear and coherent with normal rate, tone and volume.  Mood is depressed and anxious, affect is congruent. Thought processes are coherent, goal directed and intact.  Thought content is logical.  Pt denies SI/HI.   Pt denies auditory and visual hallucinations and did not appear to be responding to internal stimuli.  Memory and concentration are good.  Fund of knowledge and use of language are average.  Insight and judgment are fair.  I am unable to comment on psychomotor activity, general appearance, hygiene, or eye contact as I was unable to physically see the patient on the phone.   Assessment and Plan: Bipolar II d/o- current episode depressed; Panic d/o without agoraphobia; GAD; Insomnia; r/o  ADHD  Decrease Effexor XR to 75mg  with goal to stop  Continue Latuda 40mg  po qD  Strattera 25mg  po BID- will increase if needed  Buspar 15mg  po BID  Depakote 250mg  po qAM and 500mg  po qPM  Ativan 1mg  po TID prn  Cialis 5mg  po prn sexual activity  No refills today  - will order labs at next visit  Follow Up Instructions: 1 week   I discussed the assessment and treatment plan with the patient. The patient was provided an opportunity to ask questions and all were answered. The patient agreed with the plan and demonstrated an understanding of the instructions.   The patient was advised to call back or seek an in-person evaluation if the symptoms worsen or if the condition fails to improve as anticipated.  I provided 35 minutes of non-face-to-face time during this encounter.   Oletta DarterSalina Camile Esters, MD

## 2019-03-04 ENCOUNTER — Other Ambulatory Visit: Payer: Self-pay

## 2019-03-04 ENCOUNTER — Ambulatory Visit (INDEPENDENT_AMBULATORY_CARE_PROVIDER_SITE_OTHER): Payer: 59 | Admitting: Psychiatry

## 2019-03-04 ENCOUNTER — Encounter (HOSPITAL_COMMUNITY): Payer: Self-pay | Admitting: Psychiatry

## 2019-03-04 DIAGNOSIS — F410 Panic disorder [episodic paroxysmal anxiety] without agoraphobia: Secondary | ICD-10-CM | POA: Diagnosis not present

## 2019-03-04 DIAGNOSIS — Z5181 Encounter for therapeutic drug level monitoring: Secondary | ICD-10-CM

## 2019-03-04 DIAGNOSIS — F411 Generalized anxiety disorder: Secondary | ICD-10-CM | POA: Diagnosis not present

## 2019-03-04 DIAGNOSIS — F3181 Bipolar II disorder: Secondary | ICD-10-CM

## 2019-03-04 MED ORDER — DIVALPROEX SODIUM ER 250 MG PO TB24: ORAL_TABLET | ORAL | 0 refills | Status: DC

## 2019-03-04 MED ORDER — LORAZEPAM 1 MG PO TABS: 1.0000 mg | ORAL_TABLET | Freq: Three times a day (TID) | ORAL | 2 refills | Status: DC | PRN

## 2019-03-04 MED ORDER — LURASIDONE HCL 40 MG PO TABS: 40.0000 mg | ORAL_TABLET | Freq: Every day | ORAL | 0 refills | Status: DC

## 2019-03-04 MED ORDER — ATOMOXETINE HCL 40 MG PO CAPS: 40.0000 mg | ORAL_CAPSULE | Freq: Two times a day (BID) | ORAL | 0 refills | Status: DC

## 2019-03-04 MED ORDER — BUSPIRONE HCL 15 MG PO TABS: 15.0000 mg | ORAL_TABLET | Freq: Two times a day (BID) | ORAL | 0 refills | Status: DC

## 2019-03-04 MED FILL — ATOMOXETINE HCL 40 MG CAPS: 40 | 90 days supply | Qty: 180 | Fill #0

## 2019-03-04 NOTE — Progress Notes (Signed)
Virtual Visit via Telephone Note  I connected with Matthew [name] on 03/04/19 at  7:30 AM EDT by telephone and verified that I am speaking with the correct person using two identifiers.  Location: Patient: home Provider: office   I discussed the limitations, risks, security and privacy concerns of performing an evaluation and management service by telephone and the availability of in person appointments. I also discussed with the patient that there may be a patient responsible charge related to this service. The patient expressed understanding and agreed to proceed.   History of Present Illness: Pt states he is "alright. The plan I have works". He is has been delegating more work and that has helped a lot. Matthew [name] has been focusing on this for the last 2 weeks and it makes him feel relieved. 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 He has been working and taking breaks in the middle of the day. Matthew [name] reports no more "give up days". Depression is a little better. Anxiety has improved and he has noticed it much. Last night he was having some due to having to do something related to his son's trip this weekend and work deadline. He took some deep breaths and figured out what was causing the anxiety. He has been taking the Ativan TID each day but still has anxiety on top that. Matthew [name] denies any panic attacks over the week. He still feels "scatter brained" he will move from project and project or things around the house. He thinks it might be due to being overwhelmed and the ADHD. Matthew [name] denies SI/HI. He denies manic and hypomanic like symptoms. Sleep is good especially with Latuda. He is now able to fall asleep and stay asleep all night and wakes up rested.    Observations/Objective: I spoke with Matthew [name] on the phone.  Pt was calm, pleasant and cooperative.  Pt was engaged in the conversation and answered questions appropriately.  Speech was clear and coherent  with normal rate, tone and volume.  Mood is mildly depressed and anxious, affect is brighter and relaxed as compared to last week. Thought processes are coherent, goal oriented and intact.  Thought content is logical.  Pt denies SI/HI.   Pt denies auditory and visual hallucinations and did not appear to be responding to internal stimuli.  Memory and concentration are good.  Fund of knowledge and use of language are average.  Insight and judgment are fair.  I am unable to comment on psychomotor activity, general appearance, hygiene, or eye contact as I was unable to physically see the patient on the phone.  Vital signs not available since interview conducted virtually.    Assessment and Plan: Bipolar 2 disorder-current episode depressed; panic 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; GAD; insomnia; rule out ADHD  D/c Effexor XR   Latuda 40 mg p.o. daily  Increase Strattera 40 mg p.o. twice daily  BuSpar 15 mg p.o. twice daily  Depakote 250 mg p.o. every morning and 500 mg p.o. every afternoon  Ativan 1 mg p.o. 3 times daily as needed anxiety  Cialis 5 mg p.o. as needed sexual activity  Ordered labs- Depakote level, CBC, CMP, HbA1c, Lipid panel, TSH, Prolactin level, EKG    Follow Up Instructions: In 1 week or sooner if needed   I discussed the assessment and treatment plan with the patient. The patient was provided an opportunity to ask questions and all were answered. The patient agreed with the plan and demonstrated  an understanding of the instructions.   The patient was advised to call back or seek an in-person evaluation if the symptoms worsen or if the condition fails to improve as anticipated.  I provided 25 minutes of non-face-to-face time during this encounter.   Oletta DarterSalina Quinlyn Tep, MD

## 2019-03-08 ENCOUNTER — Other Ambulatory Visit (HOSPITAL_COMMUNITY): Payer: Self-pay | Admitting: Psychiatry

## 2019-03-08 DIAGNOSIS — R37 Sexual dysfunction, unspecified: Secondary | ICD-10-CM

## 2019-03-08 MED FILL — busPIRone HCL 15 MG TABS: 15 | 90 days supply | Qty: 180 | Fill #0

## 2019-03-08 MED FILL — DIVALPROEX SOD ER 250 MG TA: 250 | 90 days supply | Qty: 270 | Fill #0

## 2019-03-09 ENCOUNTER — Other Ambulatory Visit: Payer: Self-pay

## 2019-03-09 ENCOUNTER — Encounter: Payer: Self-pay | Admitting: Family Medicine

## 2019-03-09 ENCOUNTER — Ambulatory Visit (INDEPENDENT_AMBULATORY_CARE_PROVIDER_SITE_OTHER): Payer: 59 | Admitting: Family Medicine

## 2019-03-09 VITALS — BP 126/80 | HR 105 | Temp 98.1°F | Resp 17 | Ht 78.0 in | Wt 223.0 lb

## 2019-03-09 DIAGNOSIS — E663 Overweight: Secondary | ICD-10-CM

## 2019-03-09 DIAGNOSIS — F3181 Bipolar II disorder: Secondary | ICD-10-CM

## 2019-03-09 DIAGNOSIS — Z0000 Encounter for general adult medical examination without abnormal findings: Secondary | ICD-10-CM | POA: Diagnosis not present

## 2019-03-09 DIAGNOSIS — B001 Herpesviral vesicular dermatitis: Secondary | ICD-10-CM | POA: Diagnosis not present

## 2019-03-09 LAB — CBC WITH DIFFERENTIAL/PLATELET
Basophils Absolute: 0 10*3/uL (ref 0.0–0.1)
Basophils Relative: 0.7 % (ref 0.0–3.0)
Eosinophils Absolute: 0.2 10*3/uL (ref 0.0–0.7)
Eosinophils Relative: 3.6 % (ref 0.0–5.0)
HCT: 47.2 % (ref 39.0–52.0)
Hemoglobin: 16 g/dL (ref 13.0–17.0)
Lymphocytes Relative: 35.2 % (ref 12.0–46.0)
Lymphs Abs: 2.3 10*3/uL (ref 0.7–4.0)
MCHC: 33.8 g/dL (ref 30.0–36.0)
MCV: 93.3 fl (ref 78.0–100.0)
Monocytes Absolute: 0.6 10*3/uL (ref 0.1–1.0)
Monocytes Relative: 8.5 % (ref 3.0–12.0)
Neutro Abs: 3.4 10*3/uL (ref 1.4–7.7)
Neutrophils Relative %: 52 % (ref 43.0–77.0)
Platelets: 174 10*3/uL (ref 150.0–400.0)
RBC: 5.06 Mil/uL (ref 4.22–5.81)
RDW: 13.2 % (ref 11.5–15.5)
WBC: 6.5 10*3/uL (ref 4.0–10.5)

## 2019-03-09 LAB — BASIC METABOLIC PANEL
BUN: 12 mg/dL (ref 6–23)
CO2: 27 mEq/L (ref 19–32)
Calcium: 9.2 mg/dL (ref 8.4–10.5)
Chloride: 103 mEq/L (ref 96–112)
Creatinine, Ser: 0.81 mg/dL (ref 0.40–1.50)
GFR: 101.63 mL/min (ref >60.00)
Glucose, Bld: 94 mg/dL (ref 70–99)
Potassium: 4.4 mEq/L (ref 3.5–5.1)
Sodium: 139 mEq/L (ref 135–145)

## 2019-03-09 LAB — HEPATIC FUNCTION PANEL
ALT: 28 U/L (ref 0–53)
AST: 17 U/L (ref 0–37)
Albumin: 4.6 g/dL (ref 3.5–5.2)
Alkaline Phosphatase: 60 U/L (ref 39–117)
Bilirubin, Direct: 0.1 mg/dL (ref 0.0–0.3)
Total Bilirubin: 0.6 mg/dL (ref 0.2–1.2)
Total Protein: 6.7 g/dL (ref 6.0–8.3)

## 2019-03-09 LAB — LIPID PANEL
Cholesterol: 161 mg/dL (ref 0–200)
HDL: 30.6 mg/dL — ABNORMAL LOW (ref >39.00)
LDL Cholesterol: 96 mg/dL (ref 0–99)
NonHDL: 130.73
Total CHOL/HDL Ratio: 5
Triglycerides: 174 mg/dL — ABNORMAL HIGH (ref 0.0–149.0)
VLDL: 34.8 mg/dL (ref 0.0–40.0)

## 2019-03-09 LAB — HEMOGLOBIN A1C: Hgb A1c MFr Bld: 5.4 % (ref 4.6–6.5)

## 2019-03-09 LAB — TSH: TSH: 1.5 u[IU]/mL (ref 0.35–4.50)

## 2019-03-09 MED ORDER — VALACYCLOVIR HCL 1 G PO TABS: 2000.0000 mg | ORAL_TABLET | Freq: Two times a day (BID) | ORAL | 6 refills | Status: DC

## 2019-03-09 MED ORDER — CELECOXIB 100 MG PO CAPS: 100.0000 mg | ORAL_CAPSULE | Freq: Every day | ORAL | 3 refills | Status: DC

## 2019-03-09 MED ORDER — BACLOFEN 10 MG PO TABS: 10.0000 mg | ORAL_TABLET | Freq: Three times a day (TID) | ORAL | 3 refills | Status: DC | PRN

## 2019-03-09 MED FILL — CELECOXIB 100 MG CAP: 100 | 30 days supply | Qty: 30 | Fill #0

## 2019-03-09 MED FILL — BACLOFEN 10 MG TABS: 10 | 15 days supply | Qty: 45 | Fill #0

## 2019-03-09 MED FILL — valACYclovir HCL 1 GM TABS: 1 | 1 days supply | Qty: 4 | Fill #0

## 2019-03-09 NOTE — Patient Instructions (Signed)
Follow up in 1 year or as needed We'll notify you of your lab results and make any changes if needed Continue to work on healthy diet and regular exercise- you can do it! STOP SMOKING! Call with any questions or concerns Stay Safe!!!

## 2019-03-09 NOTE — Progress Notes (Signed)
   Subjective:    Patient ID: Matthew [name], male    DOB: 1970-11-05, 48 y.o.   MRN: [id number]  HPI CPE- UTD on Tdap, flu.  Has labs ordered per psych- need to be drawn today.   Review of Systems Patient reports no vision/hearing changes, anorexia, fever ,adenopathy, persistant/recurrent hoarseness, swallowing issues, chest pain, palpitations, edema, hemoptysis, dyspnea (rest,exertional, paroxysmal nocturnal), gastrointestinal  bleeding (melena, rectal bleeding), abdominal pain, excessive heart burn, GU symptoms (dysuria, hematuria, voiding/incontinence issues) syncope, focal weakness, memory loss, numbness & tingling, skin/hair/nail changes, abnormal bruising/bleeding, musculoskeletal symptoms/signs.   + chronic cough + chronic anxiety/depression    Objective:   Physical Exam General Appearance:    Alert, cooperative, no distress, appears stated age  Head:    Normocephalic, without obvious abnormality, atraumatic  Eyes:    PERRL, conjunctiva/corneas clear, EOM's intact, fundi    benign, both eyes       Ears:    Normal TM's and external ear canals, both ears  Nose:   Nares normal, septum midline, mucosa normal, no drainage   or sinus tenderness  Throat:   Lips, mucosa, and tongue normal; teeth and gums normal  Neck:   Supple, symmetrical, trachea midline, no adenopathy;       thyroid:  No enlargement/tenderness/nodules  Back:     Symmetric, no curvature, ROM normal, no CVA tenderness  Lungs:     Clear to auscultation bilaterally, respirations unlabored  Chest wall:    No tenderness or deformity  Heart:    Regular rate and rhythm, S1 and S2 normal, no murmur, rub   or gallop  Abdomen:     Soft, non-tender, bowel sounds active all four quadrants,    no masses, no organomegaly  Genitalia:    Normal male without lesion, masses,discharge or tenderness  Rectal:    Deferred due to young age  Extremities:   Extremities normal, atraumatic, no cyanosis or edema  Pulses:   2+ and  symmetric all extremities  Skin:   Skin color, texture, turgor normal, no rashes or lesions  Lymph nodes:   Cervical, supraclavicular, and axillary nodes normal  Neurologic:   CNII-XII intact. Normal strength, sensation and reflexes      throughout   ...................................................Marland Kitchen       Assessment & Plan:

## 2019-03-09 NOTE — Assessment & Plan Note (Signed)
Stressed need for healthy diet and regular exercise.  Check labs to risk stratify.  Will follow. 

## 2019-03-09 NOTE — Assessment & Plan Note (Signed)
Pt's PE WNL.  UTD on Tdap.  Check labs.  Anticipatory guidance provided.  

## 2019-03-09 NOTE — Assessment & Plan Note (Signed)
Re-ordered labs for psych today.

## 2019-03-10 LAB — PROLACTIN: Prolactin: 7.4 ng/mL (ref 2.0–18.0)

## 2019-03-10 LAB — VALPROIC ACID LEVEL: Valproic Acid Lvl: 39.1 mg/L — ABNORMAL LOW (ref 50.0–100.0)

## 2019-03-11 ENCOUNTER — Other Ambulatory Visit: Payer: Self-pay

## 2019-03-11 ENCOUNTER — Ambulatory Visit (INDEPENDENT_AMBULATORY_CARE_PROVIDER_SITE_OTHER): Payer: 59 | Admitting: Psychiatry

## 2019-03-11 DIAGNOSIS — Z5329 Procedure and treatment not carried out because of patient's decision for other reasons: Secondary | ICD-10-CM

## 2019-03-11 NOTE — Progress Notes (Signed)
I called twice and there was no answer. I was not able to leave a voice message. Pt is a no show.

## 2019-03-12 MED FILL — LATUDA 40 MG TABLET: 40 | 30 days supply | Qty: 30 | Fill #0

## 2019-03-12 MED FILL — TADALAFIL 5 MG TABS: 5 | 10 days supply | Qty: 10 | Fill #0

## 2019-03-13 ENCOUNTER — Ambulatory Visit (HOSPITAL_COMMUNITY)
Admission: RE | Admit: 2019-03-13 | Discharge: 2019-03-13 | Disposition: A | Discharge status: Home / Self Care | Payer: 59 | Attending: Psychiatry | Admitting: Psychiatry

## 2019-03-15 ENCOUNTER — Telehealth (HOSPITAL_COMMUNITY): Payer: Self-pay

## 2019-03-15 ENCOUNTER — Telehealth: Payer: Self-pay

## 2019-03-15 NOTE — Telephone Encounter (Signed)
Medication management - Telephone call with pt stating he was not doing very well at this time.  States he went to the inpatient over the weekend for an evaluation and felt they were not helpful at all. Pt. requests to restart ECT as soon as possible.  Agreed to contact Dr. Doyne Keel to see if she could support patient getting back into ECT with Dr. Weber Cooks as soon as he could be scheduled and approved.  Spoke with Dr. Doyne Keel who stated she would reach out to Dr. Weber Cooks to get patient in as soon as possible and for patient to keep appointment with her scheduled for this Thursday 03/18/19 at 7:30am.  Called patient back to arrange for him to contact this nurse back on tomorrow for an update on getting scheduled as soon as this could be set up.  Patient reported at present he can keep himself safe and denied being a danger to self or others. Dr. Doyne Keel will let this nurse know what gets arranged for patient for restarting ECT.

## 2019-03-16 ENCOUNTER — Other Ambulatory Visit: Payer: Self-pay | Admitting: Psychiatry

## 2019-03-17 ENCOUNTER — Other Ambulatory Visit
Admission: RE | Admit: 2019-03-17 | Discharge: 2019-03-17 | Disposition: A | Discharge status: Home / Self Care | Payer: 59 | Source: Ambulatory Visit | Attending: Psychiatry | Admitting: Psychiatry

## 2019-03-17 ENCOUNTER — Encounter: Payer: Self-pay | Admitting: Certified Registered"

## 2019-03-17 ENCOUNTER — Other Ambulatory Visit: Payer: Self-pay

## 2019-03-17 ENCOUNTER — Encounter
Admission: RE | Admit: 2019-03-17 | Discharge: 2019-03-17 | Disposition: A | Discharge status: Home / Self Care | Payer: 59 | Source: Ambulatory Visit | Attending: Psychiatry | Admitting: Psychiatry

## 2019-03-17 DIAGNOSIS — F17210 Nicotine dependence, cigarettes, uncomplicated: Secondary | ICD-10-CM | POA: Insufficient documentation

## 2019-03-17 DIAGNOSIS — J449 Chronic obstructive pulmonary disease, unspecified: Secondary | ICD-10-CM | POA: Insufficient documentation

## 2019-03-17 DIAGNOSIS — Z818 Family history of other mental and behavioral disorders: Secondary | ICD-10-CM | POA: Diagnosis not present

## 2019-03-17 DIAGNOSIS — F319 Bipolar disorder, unspecified: Secondary | ICD-10-CM | POA: Insufficient documentation

## 2019-03-17 DIAGNOSIS — F332 Major depressive disorder, recurrent severe without psychotic features: Secondary | ICD-10-CM

## 2019-03-17 DIAGNOSIS — F3181 Bipolar II disorder: Secondary | ICD-10-CM | POA: Diagnosis not present

## 2019-03-17 DIAGNOSIS — Z20828 Contact with and (suspected) exposure to other viral communicable diseases: Secondary | ICD-10-CM | POA: Diagnosis not present

## 2019-03-17 DIAGNOSIS — F419 Anxiety disorder, unspecified: Secondary | ICD-10-CM | POA: Diagnosis not present

## 2019-03-17 LAB — SARS CORONAVIRUS 2 BY RT PCR (HOSPITAL ORDER, PERFORMED IN ~~LOC~~ HOSPITAL LAB): SARS Coronavirus 2: NEGATIVE

## 2019-03-17 MED ORDER — LABETALOL HCL 5 MG/ML IV SOLN
INTRAVENOUS | Status: DC | PRN
  Administered 2019-03-17: 10 mg via INTRAVENOUS

## 2019-03-17 MED ORDER — METHOHEXITAL SODIUM 100 MG/10ML IV SOSY
PREFILLED_SYRINGE | INTRAVENOUS | Status: DC | PRN
  Administered 2019-03-17: 120 mg via INTRAVENOUS

## 2019-03-17 MED ORDER — GLYCOPYRROLATE 0.2 MG/ML IJ SOLN
INTRAMUSCULAR | Status: AC
  Filled 2019-03-17: qty 1

## 2019-03-17 MED ORDER — KETOROLAC TROMETHAMINE 30 MG/ML IJ SOLN
INTRAMUSCULAR | Status: AC
  Administered 2019-03-17: 30 mg via INTRAVENOUS
  Filled 2019-03-17: qty 1

## 2019-03-17 MED ORDER — SODIUM CHLORIDE 0.9 % IV SOLN: 500.0000 mL | Freq: Once | INTRAVENOUS | Status: DC

## 2019-03-17 MED ORDER — GLYCOPYRROLATE 0.2 MG/ML IJ SOLN
0.3000 mg | Freq: Once | INTRAMUSCULAR | Status: AC
  Administered 2019-03-17: 10:00:00 0.3 mg via INTRAVENOUS

## 2019-03-17 MED ORDER — GLYCOPYRROLATE 0.2 MG/ML IJ SOLN
INTRAMUSCULAR | Status: AC
  Administered 2019-03-17: 0.3 mg via INTRAVENOUS
  Filled 2019-03-17: qty 1

## 2019-03-17 MED ORDER — MIDAZOLAM HCL 5 MG/5ML IJ SOLN
INTRAMUSCULAR | Status: DC | PRN
  Administered 2019-03-17: 2 mg via INTRAVENOUS

## 2019-03-17 MED ORDER — MIDAZOLAM HCL 2 MG/2ML IJ SOLN: 4.0000 mg | Freq: Once | INTRAMUSCULAR | Status: DC

## 2019-03-17 MED ORDER — MIDAZOLAM HCL 2 MG/2ML IJ SOLN
INTRAMUSCULAR | Status: AC
  Filled 2019-03-17: qty 2

## 2019-03-17 MED ORDER — LABETALOL HCL 5 MG/ML IV SOLN
INTRAVENOUS | Status: AC
  Filled 2019-03-17: qty 4

## 2019-03-17 MED ORDER — SODIUM CHLORIDE 0.9 % IV SOLN
500.0000 mL | Freq: Once | INTRAVENOUS | Status: AC
  Administered 2019-03-17: 500 mL via INTRAVENOUS

## 2019-03-17 MED ORDER — KETOROLAC TROMETHAMINE 30 MG/ML IJ SOLN
30.0000 mg | Freq: Once | INTRAMUSCULAR | Status: AC
  Administered 2019-03-17: 10:00:00 30 mg via INTRAVENOUS

## 2019-03-17 MED ORDER — MIDAZOLAM HCL 2 MG/2ML IJ SOLN: 2.0000 mg | Freq: Once | INTRAMUSCULAR | Status: DC

## 2019-03-17 MED ORDER — SUCCINYLCHOLINE CHLORIDE 20 MG/ML IJ SOLN
INTRAMUSCULAR | Status: DC | PRN
  Administered 2019-03-17: 150 mg via INTRAVENOUS

## 2019-03-17 MED ORDER — GLYCOPYRROLATE 0.2 MG/ML IJ SOLN: 0.3000 mg | Freq: Once | INTRAMUSCULAR | Status: DC

## 2019-03-17 MED ORDER — KETOROLAC TROMETHAMINE 30 MG/ML IJ SOLN: 30.0000 mg | Freq: Once | INTRAMUSCULAR | Status: DC

## 2019-03-17 NOTE — Transfer of Care (Signed)
Immediate Anesthesia Transfer of Care Note  Patient: Matthew [name]  Procedure(s) Performed: ECT TX  Patient Location: PACU  Anesthesia Type:General  Level of Consciousness: awake  Airway & Oxygen Therapy: Patient Spontanous Breathing and Patient connected to face mask oxygen  Post-op Assessment: Report given to RN and Post -op Vital signs reviewed and stable  Post vital signs: Reviewed  Last Vitals:  Vitals Value Taken Time  BP 125/92 03/17/19 1121  Temp 36.5 C 03/17/19 1112  Pulse 87 03/17/19 1121  Resp 18 03/17/19 1121  SpO2 99 % 03/17/19 1121    Last Pain:  Vitals:   03/17/19 1112  TempSrc:   PainSc: Asleep         Complications: No apparent anesthesia complications

## 2019-03-17 NOTE — Anesthesia Procedure Notes (Signed)
Procedure Name: General with mask airway Date/Time: 03/17/2019 11:03 AM Performed by: Rolla Plate, CRNA Pre-anesthesia Checklist: Patient identified, Emergency Drugs available, Suction available, Patient being monitored and Timeout performed Patient Re-evaluated:Patient Re-evaluated prior to induction Oxygen Delivery Method: Circle system utilized Preoxygenation: Pre-oxygenation with 100% oxygen Induction Type: IV induction Ventilation: Mask ventilation without difficulty and Mask ventilation throughout procedure Airway Equipment and Method: Bite block Dental Injury: Teeth and Oropharynx as per pre-operative assessment

## 2019-03-17 NOTE — Discharge Instructions (Signed)
1)  The drugs that you have been given will stay in your system until tomorrow so for the       next 24 hours you should not: ° A. Drive an automobile ° B. Make any legal decisions ° C. Drink any alcoholic beverages ° °2)  You may resume your regular meals upon return home. ° °3)  A responsible adult must take you home.  Someone should stay with you for a few          hours, then be available by phone for the remainder of the treatment day. ° °4)  You May experience any of the following symptoms: ° Headache, Nausea and a dry mouth (due to the medications you were given),  temporary memory loss and some confusion, or sore muscles (a warm bath  should help this).  If you you experience any of these symptoms let us know on                your return visit. ° °5)  Report any of the following: any acute discomfort, severe headache, or temperature        greater than 100.5 F.   Also report any unusual redness, swelling, drainage, or pain         at your IV site. ° °  You may report Symptoms to:  ECT PROGRAM- [place] [facility] °         Phone: [phone], ECT Department  °         or Dr. Clapac's office [phone] ° °6)  Your next ECT Treatment is Friday August 14  ° We will call 2 days prior to your scheduled appointment for arrival times. ° °7)  Nothing to eat or drink after midnight the night before your procedure. ° °8)  Take    With a sip of water the morning of your procedure. ° °9)  Other Instructions: Call [phone] to cancel the morning of your procedure due         to illness or emergency. ° °10) We will call within 72 hours to assess how you are feeling.  °

## 2019-03-17 NOTE — Anesthesia Preprocedure Evaluation (Signed)
Anesthesia Evaluation  Patient identified by MRN, date of birth, ID band Patient awake    Reviewed: Allergy & Precautions, NPO status , Patient's Chart, lab work & pertinent test results  History of Anesthesia Complications Negative for: history of anesthetic complications  Airway Mallampati: III  TM Distance: >3 FB Neck ROM: Full    Dental no notable dental hx.    Pulmonary asthma , neg sleep apnea, COPD,  COPD inhaler, Current Smoker,    breath sounds clear to auscultation- rhonchi (-) wheezing      Cardiovascular Exercise Tolerance: Good (-) hypertension(-) angina(-) CAD, (-) Past MI, (-) Cardiac Stents and (-) CABG  Rhythm:Regular Rate:Normal - Systolic murmurs and - Diastolic murmurs    Neuro/Psych neg Seizures PSYCHIATRIC DISORDERS Anxiety Depression Bipolar Disorder negative neurological ROS     GI/Hepatic negative GI ROS, Neg liver ROS,   Endo/Other  negative endocrine ROSneg diabetes  Renal/GU negative Renal ROS     Musculoskeletal negative musculoskeletal ROS (+)   Abdominal (+) - obese,   Peds  Hematology negative hematology ROS (+)   Anesthesia Other Findings Past Medical History: 01/2012: Acute kidney failure ([place]) No date: Anxiety No date: Bipolar disorder ([place])     Comment:  hypomania No date: COPD (chronic obstructive pulmonary disease) ([facility]) No date: Depression No date: Low back pain   Reproductive/Obstetrics                             Anesthesia Physical  Anesthesia Plan  ASA: II  Anesthesia Plan: General   Post-op Pain Management:    Induction: Intravenous  PONV Risk Score and Plan: 0 and Ondansetron  Airway Management Planned: Mask  Additional Equipment:   Intra-op Plan:   Post-operative Plan:   Informed Consent: I have reviewed the patients History and Physical, chart, labs and discussed the procedure including the risks, benefits and alternatives  for the proposed anesthesia with the patient or authorized representative who has indicated his/her understanding and acceptance.     Dental advisory given  Plan Discussed with: CRNA and Anesthesiologist  Anesthesia Plan Comments:         Anesthesia Quick Evaluation

## 2019-03-17 NOTE — H&P (Signed)
Matthew [name] is an 48 y.o. male.   Chief Complaint: Mood is been more depressed.  Passive suicidal thoughts but no intent or plan whatsoever.  No psychosis HPI: History of recurrent depression good response to ECT last treatment about 6 months ago  Past Medical History:  Diagnosis Date  . Acute kidney failure ([place]) 01/2012  . Anxiety   . Bipolar disorder ([place])    hypomania  . COPD (chronic obstructive pulmonary disease) ([place])   . Depression   . Low back pain     Past Surgical History:  Procedure Laterality Date  . NO PAST SURGERIES      Family History  Problem Relation Age of Onset  . Anxiety disorder Mother   . Alcohol abuse Father   . Drug abuse Father   . Bipolar disorder Sister   . Alcohol abuse Brother   . Drug abuse Brother   . Bipolar disorder Paternal Aunt   . Schizophrenia Paternal Aunt   . Cancer Maternal Grandmother        lung  . Hypertension Maternal Grandfather   . Cancer Paternal Grandmother        lung  . Hypertension Paternal Grandfather   . Diabetes Neg Hx   . Heart disease Neg Hx   . Hyperlipidemia Neg Hx   . Stroke Neg Hx   . Suicidality Neg Hx    Social History:  reports that he has been smoking cigarettes. He has a 20.00 pack-year smoking history. He has never used smokeless tobacco. He reports that he does not drink alcohol or use drugs.  Allergies:  Allergies  Allergen Reactions  . Ciprofloxacin     Acute kidney failure    (Not in a hospital admission)   Results for orders placed or performed during the hospital encounter of 03/17/19 (from the past 48 hour(s))  SARS Coronavirus 2 (CEPHEID - Performed in [place] hospital lab), Hosp Order     Status: None   Collection Time: 03/17/19  8:10 AM   Specimen: Nasopharyngeal Swab  Result Value Ref Range   SARS Coronavirus 2 NEGATIVE NEGATIVE    Comment: (NOTE) If result is NEGATIVE SARS-CoV-2 target nucleic acids are NOT DETECTED. The SARS-CoV-2 RNA is generally detectable in  upper and lower  respiratory specimens during the acute phase of infection. The lowest  concentration of SARS-CoV-2 viral copies this assay can detect is 250  copies / mL. A negative result does not preclude SARS-CoV-2 infection  and should not be used as the sole basis for treatment or other  patient management decisions.  A negative result may occur with  improper specimen collection / handling, submission of specimen other  than nasopharyngeal swab, presence of viral mutation(s) within the  areas targeted by this assay, and inadequate number of viral copies  (<250 copies / mL). A negative result must be combined with clinical  observations, patient history, and epidemiological information. If result is POSITIVE SARS-CoV-2 target nucleic acids are DETECTED. The SARS-CoV-2 RNA is generally detectable in upper and lower  respiratory specimens dur ing the acute phase of infection.  Positive  results are indicative of active infection with SARS-CoV-2.  Clinical  correlation with patient history and other diagnostic information is  necessary to determine patient infection status.  Positive results do  not rule out bacterial infection or co-infection with other viruses. If result is PRESUMPTIVE POSTIVE SARS-CoV-2 nucleic acids MAY BE PRESENT.   A presumptive positive result was obtained on the submitted specimen  and confirmed on repeat testing.  While 2019 novel coronavirus  (SARS-CoV-2) nucleic acids may be present in the submitted sample  additional confirmatory testing may be necessary for epidemiological  and / or clinical management purposes  to differentiate between  SARS-CoV-2 and other Sarbecovirus currently known to infect humans.  If clinically indicated additional testing with an alternate test  methodology [phone] is advised. The SARS-CoV-2 RNA is generally  detectable in upper and lower respiratory sp ecimens during the acute  phase of infection. The expected result is  Negative. Fact Sheet for Patients:  BoilerBrush.com.cyhttps://www.fda.gov/media/136312/download Fact Sheet for Healthcare Providers: https://pope.com/https://www.fda.gov/media/136313/download This test is not yet approved or cleared by the [place] FDA and has been authorized for detection and/or diagnosis of SARS-CoV-2 by FDA under an Emergency Use Authorization (EUA).  This EUA will remain in effect (meaning this test can be used) for the duration of the COVID-19 declaration under Section 564(b)(1) of the Act, 21 U.S.C. section 360bbb-3(b)(1), unless the authorization is terminated or revoked sooner. Performed at [facility], [address] [place], [place] [address]    No results found.  Review of Systems  Constitutional: Negative.   HENT: Negative.   Eyes: Negative.   Respiratory: Negative.   Cardiovascular: Negative.   Gastrointestinal: Negative.   Musculoskeletal: Negative.   Skin: Negative.   Neurological: Negative.   Psychiatric/Behavioral: Positive for depression and suicidal ideas. Negative for hallucinations, memory loss and substance abuse. The patient is not nervous/anxious and does not have insomnia.     Blood pressure 118/82, pulse 78, temperature 98.7 F (37.1 C), temperature source Oral, resp. rate 19, SpO2 98 %. Physical Exam  Nursing note and vitals reviewed. Constitutional: He appears well-developed and well-nourished.  HENT:  Head: Normocephalic and atraumatic.  Eyes: Pupils are equal, round, and reactive to light. Conjunctivae are normal.  Neck: Normal range of motion.  Cardiovascular: Normal heart sounds.  Respiratory: Effort normal.  GI: Soft.  Musculoskeletal: Normal range of motion.  Neurological: He is alert.  Skin: Skin is warm and dry.  Psychiatric: His speech is normal and behavior is normal. Judgment normal. Cognition and memory are normal. He exhibits a depressed mood. He expresses suicidal ideation. He expresses no suicidal plans.      Assessment/Plan Treatment today and follow-up in 1 week and then reassess whether maintenance treatment schedule was appropriate  Matthew RasmussenJohn Neytiri Asche, MD 03/17/2019, 10:56 AM

## 2019-03-17 NOTE — Anesthesia Post-op Follow-up Note (Signed)
Anesthesia [facility] form completed.        

## 2019-03-17 NOTE — Anesthesia Postprocedure Evaluation (Signed)
Anesthesia Post Note  Patient: [name]  Procedure(s) Performed: ECT TX  Patient location during evaluation: PACU Anesthesia Type: General Level of consciousness: awake and alert Pain management: pain level controlled Vital Signs Assessment: post-procedure vital signs reviewed and stable Respiratory status: spontaneous breathing, nonlabored ventilation, respiratory function stable and patient connected to nasal cannula oxygen Cardiovascular status: blood pressure returned to baseline and stable Postop Assessment: no apparent nausea or vomiting Anesthetic complications: no     Last Vitals:  Vitals:   03/17/19 1134 03/17/19 1206  BP:    Pulse: 82 82  Resp: 17 18  Temp:    SpO2: 100%     Last Pain:  Vitals:   03/17/19 1206  TempSrc:   PainSc: 0-No pain                 Martha Clan

## 2019-03-17 NOTE — Procedures (Signed)
ECT SERVICES Physician's Interval Evaluation & Treatment Note  Patient Identification: Matthew [name] MRN:  [id number] Date of Evaluation:  03/17/2019 TX #: [id number]  MADRS:   MMSE:   P.E. Findings:  No change to physical exam  Psychiatric Interval Note:  Mood is feeling worse.  Lucid no psychotic symptoms.  Only passive suicidal thoughts with no intent or plan.  Subjective:  Patient is a 47 y.o. male seen for evaluation for Electroconvulsive Therapy. Depressed but lucid agreeable to treatment  Treatment Summary:   [x]   Right Unilateral             []  Bilateral   % Energy : 0.3 ms 50%   Impedance: 1210 ohms  Seizure Energy Index: 4124 V squared  Postictal Suppression Index: 23%  Seizure Concordance Index: 96%  Medications  Pre Shock: Robinul 0.3 mg Toradol 30 mg Brevital 120 mg succinylcholine 150 mg  Post Shock: Versed 2 mg  Seizure Duration: 37 seconds EMG 65 seconds EEG   Comments: Follow-up in 1 week  Lungs:  [x]   Clear to auscultation               []  Other:   Heart:    [x]   Regular rhythm             []  irregular rhythm    [x]   Previous H&P reviewed, patient examined and there are NO CHANGES                 []   Previous H&P reviewed, patient examined and there are changes noted.   Alethia Berthold, MD 7/29/202010:58 AM

## 2019-03-18 ENCOUNTER — Other Ambulatory Visit: Payer: Self-pay | Admitting: Psychiatry

## 2019-03-18 ENCOUNTER — Ambulatory Visit (INDEPENDENT_AMBULATORY_CARE_PROVIDER_SITE_OTHER): Payer: 59 | Admitting: Psychiatry

## 2019-03-18 ENCOUNTER — Encounter (HOSPITAL_COMMUNITY): Payer: Self-pay | Admitting: Psychiatry

## 2019-03-18 DIAGNOSIS — F3181 Bipolar II disorder: Secondary | ICD-10-CM

## 2019-03-18 DIAGNOSIS — F411 Generalized anxiety disorder: Secondary | ICD-10-CM

## 2019-03-18 NOTE — Progress Notes (Signed)
Virtual Visit via Telephone Note  I connected with Matthew [name] on 03/18/19 at  7:30 AM EDT by telephone and verified that I am speaking with the correct person using two identifiers.  Location: Patient: home Provider: office   I discussed the limitations, risks, security and privacy concerns of performing an evaluation and management service by telephone and the availability of in person appointments. I also discussed with the patient that there may be a patient responsible charge related to this service. The patient expressed understanding and agreed to proceed.   History of Present Illness: Pt had a ECT treatment yesterday. He held the evening doses of Depakote and Ativan before the treatment. He is feeling very tired today. He is experiencing muscle soreness all over his body and is moving slow today. He has to work at noon. He is off tomorrow and has to decide if needs to have another ECT treatment.  He was told that he can contact the nurse at 8 AM tomorrow if he will need another ECT treatment.  Yesterday after treatment he felt "pretty good" except for a headache all day. He felter calmer, less irritable, no passive or active thoughts of death. He was able to sleep after taking Tylenol and ibuprofen.  Today he feels same and likes that he is not overwhelmed.  He states that his depression is decreased.  If things continue to improve throughout the day then he may not need an ECT treatment tomorrow.  He states per the discussion he had with the ECT doctor-[facility] may need to come in every few weeks for 1 maintenance treatment.  At this point if he is feeling down then he can have another treatment on Friday otherwise they will reevaluate in 2 weeks.  He denies manic or hypomanic-like symptoms.   Observations/Objective: I spoke with Matthew [name] on the phone.  Pt was calm, pleasant and cooperative.  Pt was engaged in the conversation and answered questions appropriately.   Speech was clear and coherent with normal rate, tone and volume.  Mood is less depressed and anxious, affect is congruent but calmer and brighter and less distressed than previous conversation on Monday. Thought processes are coherent, goal oriented and intact.  Thought content is logical.  Pt denies SI/HI.   Pt denies auditory and visual hallucinations and did not appear to be responding to internal stimuli.  Memory and concentration are good.  Fund of knowledge and use of language are average.  Insight and judgment are fair.  I am unable to comment on psychomotor activity, general appearance, hygiene, or eye contact as I was unable to physically see the patient on the phone.  Vital signs not available since interview conducted virtually.    Assessment and Plan: Bipolar 2 d/o- current episode depressed; Panic d/o without agoraphobia; GAD; Insomnia; r/o ADHD   Latuda 40 mg p.o. daily  Strattera 40 mg p.o. twice daily  BuSpar 15 mg p.o. twice daily  Depakote 250 mg p.o. every morning and 500 mg p.o. q. afternoon  Ativan 1 mg p.o. 3 times daily as needed anxiety  Cialis 5 mg p.o. as needed sexual activity  I reviewed his labs done on 03/09/2019- CMP within normal limits, lipid profile shows elevated triglycerides and decreased HDL, CBC within normal limits, hemoglobin A1c 5.4, TSH 1.5, prolactin 7.4 and within normal limits, valproic acid was low at 39.1   Follow Up Instructions: In 2 weeks or sooner if needed   I discussed the  assessment and treatment plan with the patient. The patient was provided an opportunity to ask questions and all were answered. The patient agreed with the plan and demonstrated an understanding of the instructions.   The patient was advised to call back or seek an in-person evaluation if the symptoms worsen or if the condition fails to improve as anticipated.  I provided 15 minutes of non-face-to-face time during this encounter.   Oletta DarterSalina Arnesha Schiraldi, MD

## 2019-03-18 NOTE — Telephone Encounter (Signed)
I received a message that Matthew [name] was experiencing severe depression.  I called him and he confirmed.  He was feeling very depressed with SI.  He states that he was irritable, overwhelmed, crying and unable to focus.  The symptoms became intense over the weekend.  He felt that he was a danger to himself so he went to the behavioral health crisis unit.  He states while there he was treated very badly and left.  Today Matthew [name] states that he feels he is in need of ECT treatments again.  I contacted the ECT provider at [facility].  They were agreeable to restarting ECT and were able to fit him in on Wednesday.  They stated they would contact him to get him set up.  I called Matthew [name] again to let them know that someone would be contacting him and he would be able to start receiving ECT treatments on Wednesday.  He was able to contract for safety and states that his wife is keeping a close eye on him.

## 2019-03-25 ENCOUNTER — Encounter (HOSPITAL_COMMUNITY): Payer: Self-pay | Admitting: Psychiatry

## 2019-03-25 ENCOUNTER — Other Ambulatory Visit: Payer: Self-pay

## 2019-03-25 ENCOUNTER — Ambulatory Visit (INDEPENDENT_AMBULATORY_CARE_PROVIDER_SITE_OTHER): Payer: 59 | Admitting: Psychiatry

## 2019-03-25 ENCOUNTER — Ambulatory Visit (HOSPITAL_COMMUNITY): Payer: 59 | Admitting: Psychiatry

## 2019-03-25 DIAGNOSIS — F411 Generalized anxiety disorder: Secondary | ICD-10-CM | POA: Diagnosis not present

## 2019-03-25 DIAGNOSIS — F5105 Insomnia due to other mental disorder: Secondary | ICD-10-CM | POA: Diagnosis not present

## 2019-03-25 DIAGNOSIS — F99 Mental disorder, not otherwise specified: Secondary | ICD-10-CM | POA: Diagnosis not present

## 2019-03-25 DIAGNOSIS — F3181 Bipolar II disorder: Secondary | ICD-10-CM | POA: Diagnosis not present

## 2019-03-25 MED ORDER — BUSPIRONE HCL 15 MG PO TABS: 15.0000 mg | ORAL_TABLET | Freq: Every day | ORAL | 0 refills | Status: DC

## 2019-03-25 NOTE — Progress Notes (Signed)
Virtual Visit via Telephone Note  I connected with Matthew [name] on 03/25/19 at  7:30 AM EDT by telephone and verified that I am speaking with the correct person using two identifiers.  Location: Patient: home Provider: office   I discussed the limitations, risks, security and privacy concerns of performing an evaluation and management service by telephone and the availability of in person appointments. I also discussed with the patient that there may be a patient responsible charge related to this service. The patient expressed understanding and agreed to proceed.   History of Present Illness: Pt states day by day he is feeling better. He is able to focus more, not as tired, more motivated and no longer having passive thoughts of death. Sleep is good. He denies SI/HI. [place] states he is still irritable but it is improving slowly. His wife told him that since starting [place] he has been a little more irritable. He denies any manic and hypomanic like symptoms. It is usually shows as something that he becomes as obessively and unrealistically focused on. 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 The anxiety is improving but he still wants to continue Ativan. He feels better with Ativan that Buspar. The Buspar helps but it makes him drowsy. He stopped the Buspar yesterday and noticed that it was the best day he has had since the ECT treatment. He wants to stop the daytime dose. 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 If needed he has another ECT treatment scheduled on Aug 13. He will monitor and determine if needed next week. He states he still has sore muscles all over since his last ECT treatment.    Observations/Objective: I spoke with Matthew [name] on the phone.  Pt was calm, pleasant and cooperative.  Pt was engaged in the conversation and answered questions appropriately.  Speech was clear and coherent with normal rate, tone and volume.   Mood is mildly depressed and anxious, affect is congruent but brighter than at previous appt. Thought processes are coherent, goal oriented and intact.  Thought content is logical.  Pt denies SI/HI.   Pt denies auditory and visual hallucinations and did not appear to be responding to internal stimuli.  Memory and concentration are good.  Fund of knowledge and use of language are average.  Insight and judgment are fair.  I am unable to comment on psychomotor activity, general appearance, hygiene, or eye contact as I was unable to physically see the patient on the phone.  Vital signs not available since interview conducted virtually.    Assessment and Plan: Bipolar 2 d/o- current episode depressed; Panic d/o without agoraphobia; GAD; Insomnia; r/o ADHD  Decrease Buspar to 15mg  po qHS due to daytime fatigue  Latuda 40mg  po qD  Stattera 40mg  po BID  Depakote 250mg  po qAM and 500mg  qLunch  Ativan 1mg  po TID prn anxiety  Cialis 5mg  po prn sexual acitivites  Follow Up Instructions: In 2 weeks or sooner if needed   I discussed the assessment and treatment plan with the patient. The patient was provided an opportunity to ask questions and all were answered. The patient agreed with the plan and demonstrated an understanding of the instructions.   The patient was advised to call back or seek an in-person evaluation if the symptoms worsen or if the condition fails to improve as anticipated.  I provided 15 minutes of non-face-to-face time during this encounter.  Charlcie Cradle, MD

## 2019-03-29 ENCOUNTER — Telehealth: Payer: Self-pay

## 2019-03-30 MED FILL — predniSONE 10 MG TABS: 10 | 6 days supply | Qty: 21 | Fill #0

## 2019-04-01 ENCOUNTER — Other Ambulatory Visit: Payer: Self-pay | Admitting: Psychiatry

## 2019-04-02 ENCOUNTER — Other Ambulatory Visit: Payer: Self-pay

## 2019-04-02 ENCOUNTER — Encounter: Payer: Self-pay | Admitting: Anesthesiology

## 2019-04-02 ENCOUNTER — Other Ambulatory Visit
Admission: RE | Admit: 2019-04-02 | Discharge: 2019-04-02 | Disposition: A | Discharge status: Home / Self Care | Payer: 59 | Source: Ambulatory Visit | Attending: Psychiatry | Admitting: Psychiatry

## 2019-04-02 ENCOUNTER — Encounter
Admission: RE | Admit: 2019-04-02 | Discharge: 2019-04-02 | Disposition: A | Discharge status: Home / Self Care | Payer: 59 | Source: Ambulatory Visit | Attending: Psychiatry | Admitting: Psychiatry

## 2019-04-02 DIAGNOSIS — F3130 Bipolar disorder, current episode depressed, mild or moderate severity, unspecified: Secondary | ICD-10-CM | POA: Diagnosis not present

## 2019-04-02 DIAGNOSIS — Z20828 Contact with and (suspected) exposure to other viral communicable diseases: Secondary | ICD-10-CM | POA: Diagnosis not present

## 2019-04-02 DIAGNOSIS — J449 Chronic obstructive pulmonary disease, unspecified: Secondary | ICD-10-CM | POA: Insufficient documentation

## 2019-04-02 DIAGNOSIS — Z818 Family history of other mental and behavioral disorders: Secondary | ICD-10-CM | POA: Diagnosis not present

## 2019-04-02 DIAGNOSIS — F332 Major depressive disorder, recurrent severe without psychotic features: Secondary | ICD-10-CM

## 2019-04-02 DIAGNOSIS — F419 Anxiety disorder, unspecified: Secondary | ICD-10-CM | POA: Insufficient documentation

## 2019-04-02 DIAGNOSIS — Z811 Family history of alcohol abuse and dependence: Secondary | ICD-10-CM | POA: Diagnosis not present

## 2019-04-02 DIAGNOSIS — F17210 Nicotine dependence, cigarettes, uncomplicated: Secondary | ICD-10-CM | POA: Diagnosis not present

## 2019-04-02 DIAGNOSIS — Z881 Allergy status to other antibiotic agents status: Secondary | ICD-10-CM | POA: Diagnosis not present

## 2019-04-02 LAB — SARS CORONAVIRUS 2 BY RT PCR (HOSPITAL ORDER, PERFORMED IN ~~LOC~~ HOSPITAL LAB): SARS Coronavirus 2: NEGATIVE

## 2019-04-02 MED ORDER — KETOROLAC TROMETHAMINE 30 MG/ML IJ SOLN
30.0000 mg | Freq: Once | INTRAMUSCULAR | Status: AC
  Administered 2019-04-02: 30 mg via INTRAVENOUS

## 2019-04-02 MED ORDER — MIDAZOLAM HCL 2 MG/2ML IJ SOLN
INTRAMUSCULAR | Status: DC | PRN
  Administered 2019-04-02: 2 mg via INTRAVENOUS

## 2019-04-02 MED ORDER — KETOROLAC TROMETHAMINE 30 MG/ML IJ SOLN
INTRAMUSCULAR | Status: AC
  Administered 2019-04-02: 10:00:00 30 mg via INTRAVENOUS
  Filled 2019-04-02: qty 1

## 2019-04-02 MED ORDER — SODIUM CHLORIDE 0.9 % IV SOLN
500.0000 mL | Freq: Once | INTRAVENOUS | Status: AC
  Administered 2019-04-02: 500 mL via INTRAVENOUS

## 2019-04-02 MED ORDER — SUCCINYLCHOLINE CHLORIDE 20 MG/ML IJ SOLN
INTRAMUSCULAR | Status: AC
  Filled 2019-04-02: qty 1

## 2019-04-02 MED ORDER — SODIUM CHLORIDE 0.9 % IV SOLN
INTRAVENOUS | Status: DC | PRN
  Administered 2019-04-02: 11:00:00 via INTRAVENOUS

## 2019-04-02 MED ORDER — GLYCOPYRROLATE 0.2 MG/ML IJ SOLN
0.1000 mg | Freq: Once | INTRAMUSCULAR | Status: AC
  Administered 2019-04-02: 10:00:00 0.1 mg via INTRAVENOUS

## 2019-04-02 MED ORDER — MIDAZOLAM HCL 2 MG/2ML IJ SOLN: 2.0000 mg | Freq: Once | INTRAMUSCULAR | Status: DC

## 2019-04-02 MED ORDER — LABETALOL HCL 5 MG/ML IV SOLN
INTRAVENOUS | Status: AC
  Filled 2019-04-02: qty 4

## 2019-04-02 MED ORDER — SUCCINYLCHOLINE CHLORIDE 200 MG/10ML IV SOSY
PREFILLED_SYRINGE | INTRAVENOUS | Status: DC | PRN
  Administered 2019-04-02: 150 mg via INTRAVENOUS

## 2019-04-02 MED ORDER — LABETALOL HCL 5 MG/ML IV SOLN
INTRAVENOUS | Status: DC | PRN
  Administered 2019-04-02: 10 mg via INTRAVENOUS

## 2019-04-02 MED ORDER — GLYCOPYRROLATE 0.2 MG/ML IJ SOLN
INTRAMUSCULAR | Status: AC
  Administered 2019-04-02: 10:00:00 0.1 mg via INTRAVENOUS
  Filled 2019-04-02: qty 1

## 2019-04-02 MED ORDER — METHOHEXITAL SODIUM 100 MG/10ML IV SOSY
PREFILLED_SYRINGE | INTRAVENOUS | Status: DC | PRN
  Administered 2019-04-02: 120 mg via INTRAVENOUS

## 2019-04-02 MED ORDER — MIDAZOLAM HCL 2 MG/2ML IJ SOLN
INTRAMUSCULAR | Status: AC
  Filled 2019-04-02: qty 2

## 2019-04-02 NOTE — Anesthesia Post-op Follow-up Note (Signed)
Anesthesia [facility] form completed.        

## 2019-04-02 NOTE — Procedures (Signed)
ECT SERVICES Physician's Interval Evaluation & Treatment Note  Patient Identification: Matthew [name] MRN:  [id number] Date of Evaluation:  04/02/2019 TX #: 8  MADRS:   MMSE:   P.E. Findings:  No change to physical exam  Psychiatric Interval Note:  Still notes irritability grumpiness not suicidal not psychotic  Subjective:  Patient is a 48 y.o. male seen for evaluation for Electroconvulsive Therapy. Still feels a little down and irritable and anxious  Treatment Summary:   [x]   Right Unilateral             []  Bilateral   % Energy : 0.3 ms 50%   Impedance: 2110 ohms  Seizure Energy Index: 6574 V squared  Postictal Suppression Index: 57%  Seizure Concordance Index: 92%  Medications  Pre Shock: Robinul 0.3 mg Toradol 30 mg Brevital 120 mg succinylcholine 150 mg  Post Shock: Versed 2 mg  Seizure Duration: 46 seconds by EMG 76 seconds by EEG   Comments: At mutual agreement follow-up in 2 weeks  Lungs:  [x]   Clear to auscultation               []  Other:   Heart:    [x]   Regular rhythm             []  irregular rhythm    [x]   Previous H&P reviewed, patient examined and there are NO CHANGES                 []   Previous H&P reviewed, patient examined and there are changes noted.   Alethia Berthold, MD 8/14/202011:06 AM

## 2019-04-02 NOTE — Anesthesia Postprocedure Evaluation (Signed)
Anesthesia Post Note  Patient: Matthew [name]  Procedure(s) Performed: ECT TX  Patient location during evaluation: PACU Anesthesia Type: General Level of consciousness: awake and alert Pain management: pain level controlled Vital Signs Assessment: post-procedure vital signs reviewed and stable Respiratory status: spontaneous breathing, nonlabored ventilation and respiratory function stable Cardiovascular status: blood pressure returned to baseline and stable Postop Assessment: no apparent nausea or vomiting Anesthetic complications: no     Last Vitals:  Vitals:   04/02/19 1131 04/02/19 1208  BP: 125/81 (!) 119/96  Pulse: 87 66  Resp: (!) 9 18  Temp: 36.8 C   SpO2: 100%     Last Pain:  Vitals:   04/02/19 1208  TempSrc:   PainSc: 0-No pain                 Durenda Hurt

## 2019-04-02 NOTE — Anesthesia Preprocedure Evaluation (Signed)
Anesthesia Evaluation  Patient identified by MRN, date of birth, ID band Patient awake    Reviewed: Allergy & Precautions, H&P , NPO status , Patient's Chart, lab work & pertinent test results  Airway Mallampati: III  TM Distance: >3 FB Neck ROM: full    Dental  (+) Chipped   Pulmonary COPD, Current Smoker,           Cardiovascular negative cardio ROS       Neuro/Psych PSYCHIATRIC DISORDERS Anxiety Depression Bipolar Disorder negative neurological ROS     GI/Hepatic negative GI ROS, Neg liver ROS,   Endo/Other  negative endocrine ROS  Renal/GU negative Renal ROS  negative genitourinary   Musculoskeletal   Abdominal   Peds  Hematology negative hematology ROS (+)   Anesthesia Other Findings Past Medical History: 01/2012: Acute kidney failure ([place]) No date: Anxiety No date: Bipolar disorder ([place])     Comment:  hypomania No date: COPD (chronic obstructive pulmonary disease) ([facility]) No date: Depression No date: Low back pain  Past Surgical History: No date: NO PAST SURGERIES     Reproductive/Obstetrics negative OB ROS                             Anesthesia Physical Anesthesia Plan  ASA: II  Anesthesia Plan: General   Post-op Pain Management:    Induction:   PONV Risk Score and Plan:   Airway Management Planned: Mask  Additional Equipment:   Intra-op Plan:   Post-operative Plan:   Informed Consent: I have reviewed the patients History and Physical, chart, labs and discussed the procedure including the risks, benefits and alternatives for the proposed anesthesia with the patient or authorized representative who has indicated his/her understanding and acceptance.     Dental Advisory Given  Plan Discussed with: Anesthesiologist, CRNA and Surgeon  Anesthesia Plan Comments:         Anesthesia Quick Evaluation

## 2019-04-02 NOTE — Anesthesia Procedure Notes (Signed)
Date/Time: 04/02/2019 11:17 AM Performed by: Dionne Bucy, CRNA Pre-anesthesia Checklist: Patient identified, Emergency Drugs available, Suction available and Patient being monitored Patient Re-evaluated:Patient Re-evaluated prior to induction Oxygen Delivery Method: Circle system utilized Preoxygenation: Pre-oxygenation with 100% oxygen Induction Type: IV induction Ventilation: Mask ventilation without difficulty and Mask ventilation throughout procedure Airway Equipment and Method: Bite block Placement Confirmation: positive ETCO2 Dental Injury: Teeth and Oropharynx as per pre-operative assessment

## 2019-04-02 NOTE — Discharge Instructions (Signed)
1)  The drugs that you have been given will stay in your system until tomorrow so for the       next 24 hours you should not: ° A. Drive an automobile ° B. Make any legal decisions ° C. Drink any alcoholic beverages ° °2)  You may resume your regular meals upon return home. ° °3)  A responsible adult must take you home.  Someone should stay with you for a few          hours, then be available by phone for the remainder of the treatment day. ° °4)  You May experience any of the following symptoms: ° Headache, Nausea and a dry mouth (due to the medications you were given),  temporary memory loss and some confusion, or sore muscles (a warm bath  should help this).  If you you experience any of these symptoms let us know on                your return visit. ° °5)  Report any of the following: any acute discomfort, severe headache, or temperature        greater than 100.5 F.   Also report any unusual redness, swelling, drainage, or pain         at your IV site. ° °  You may report Symptoms to:  ECT PROGRAM- [facility] °         Phone: [phone], ECT Department  °         or Dr. Clapac's office [phone] ° °6)  Your next ECT Treatment is Friday August 28 at 8:00 ° We will call 2 days prior to your scheduled appointment for arrival times. ° °7)  Nothing to eat or drink after midnight the night before your procedure. ° °8)  Take    With a sip of water the morning of your procedure. ° °9)  Other Instructions: Call [phone] to cancel the morning of your procedure due         to illness or emergency. ° °10) We will call within 72 hours to assess how you are feeling.  °

## 2019-04-02 NOTE — Transfer of Care (Signed)
Immediate Anesthesia Transfer of Care Note  Patient: Matthew [name]  Procedure(s) Performed: ECT TX  Patient Location: PACU  Anesthesia Type:General  Level of Consciousness: sedated  Airway & Oxygen Therapy: Patient Spontanous Breathing and Patient connected to face mask oxygen  Post-op Assessment: Report given to RN and Post -op Vital signs reviewed and stable  Post vital signs: Reviewed and stable  Last Vitals:  Vitals Value Taken Time  BP 143/125 04/02/19 1129  Temp    Pulse 95 04/02/19 1129  Resp 14 04/02/19 1129  SpO2 100 % 04/02/19 1129  Vitals shown include unvalidated device data.  Last Pain:  Vitals:   04/02/19 0923  TempSrc: Oral  PainSc: 0-No pain         Complications: No apparent anesthesia complications

## 2019-04-02 NOTE — H&P (Signed)
Matthew [name] is an 48 y.o. male.   Chief Complaint: Still feeling a little grumpy and angry at times.  Irritable at times.  No suicidal ideation. HPI: History of recurrent severe depression and anxiety  Past Medical History:  Diagnosis Date  . Acute kidney failure ([place]) 01/2012  . Anxiety   . Bipolar disorder ([place])    hypomania  . COPD (chronic obstructive pulmonary disease) ([place])   . Depression   . Low back pain     Past Surgical History:  Procedure Laterality Date  . NO PAST SURGERIES      Family History  Problem Relation Age of Onset  . Anxiety disorder Mother   . Alcohol abuse Father   . Drug abuse Father   . Bipolar disorder Sister   . Alcohol abuse Brother   . Drug abuse Brother   . Bipolar disorder Paternal Aunt   . Schizophrenia Paternal Aunt   . Cancer Maternal Grandmother        lung  . Hypertension Maternal Grandfather   . Cancer Paternal Grandmother        lung  . Hypertension Paternal Grandfather   . Diabetes Neg Hx   . Heart disease Neg Hx   . Hyperlipidemia Neg Hx   . Stroke Neg Hx   . Suicidality Neg Hx    Social History:  reports that he has been smoking cigarettes. He has a 20.00 pack-year smoking history. He has never used smokeless tobacco. He reports that he does not drink alcohol or use drugs.  Allergies:  Allergies  Allergen Reactions  . Ciprofloxacin     Acute kidney failure    (Not in a hospital admission)   Results for orders placed or performed during the hospital encounter of 04/02/19 (from the past 48 hour(s))  SARS Coronavirus 2 [facility] order, Performed in [place] hospital lab)     Status: None   Collection Time: 04/02/19  8:19 AM  Result Value Ref Range   SARS Coronavirus 2 NEGATIVE NEGATIVE    Comment: (NOTE) If result is NEGATIVE SARS-CoV-2 target nucleic acids are NOT DETECTED. The SARS-CoV-2 RNA is generally detectable in upper and lower  respiratory specimens during the acute phase of infection. The  lowest  concentration of SARS-CoV-2 viral copies this assay can detect is 250  copies / mL. A negative result does not preclude SARS-CoV-2 infection  and should not be used as the sole basis for treatment or other  patient management decisions.  A negative result may occur with  improper specimen collection / handling, submission of specimen other  than nasopharyngeal swab, presence of viral mutation(s) within the  areas targeted by this assay, and inadequate number of viral copies  (<250 copies / mL). A negative result must be combined with clinical  observations, patient history, and epidemiological information. If result is POSITIVE SARS-CoV-2 target nucleic acids are DETECTED. The SARS-CoV-2 RNA is generally detectable in upper and lower  respiratory specimens dur ing the acute phase of infection.  Positive  results are indicative of active infection with SARS-CoV-2.  Clinical  correlation with patient history and other diagnostic information is  necessary to determine patient infection status.  Positive results do  not rule out bacterial infection or co-infection with other viruses. If result is PRESUMPTIVE POSTIVE SARS-CoV-2 nucleic acids MAY BE PRESENT.   A presumptive positive result was obtained on the submitted specimen  and confirmed on repeat testing.  While 2019 novel coronavirus  (SARS-CoV-2) nucleic acids  may be present in the submitted sample  additional confirmatory testing may be necessary for epidemiological  and / or clinical management purposes  to differentiate between  SARS-CoV-2 and other Sarbecovirus currently known to infect humans.  If clinically indicated additional testing with an alternate test  methodology [phone]([id number]) is advised. The SARS-CoV-2 RNA is generally  detectable in upper and lower respiratory sp ecimens during the acute  phase of infection. The expected result is Negative. Fact Sheet for Patients:   BoilerBrush.com.cyhttps://www.fda.gov/media/136312/download Fact Sheet for Healthcare Providers: https://pope.com/https://www.fda.gov/media/136313/download This test is not yet approved or cleared by the [place] FDA and has been authorized for detection and/or diagnosis of SARS-CoV-2 by FDA under an Emergency Use Authorization (EUA).  This EUA will remain in effect (meaning this test can be used) for the duration of the COVID-19 declaration under Section 564(b)(1) of the Act, 21 U.S.C. section 360bbb-3(b)(1), unless the authorization is terminated or revoked sooner. Performed at [facility], [address] [place], [place] [address]    No results found.  Review of Systems  Constitutional: Negative.   HENT: Negative.   Eyes: Negative.   Respiratory: Negative.   Cardiovascular: Negative.   Gastrointestinal: Negative.   Musculoskeletal: Negative.   Skin: Negative.   Neurological: Negative.   Psychiatric/Behavioral: Positive for depression. Negative for hallucinations, memory loss, substance abuse and suicidal ideas. The patient is not nervous/anxious and does not have insomnia.     Blood pressure (!) 125/91, pulse 71, temperature 98.1 F (36.7 C), temperature source Oral, resp. rate 18, SpO2 98 %. Physical Exam  Nursing note and vitals reviewed. Constitutional: He appears well-developed and well-nourished.  HENT:  Head: Normocephalic and atraumatic.  Eyes: Pupils are equal, round, and reactive to light. Conjunctivae are normal.  Neck: Normal range of motion.  Cardiovascular: Regular rhythm and normal heart sounds.  Respiratory: Effort normal.  GI: Soft.  Musculoskeletal: Normal range of motion.  Neurological: He is alert.  Skin: Skin is warm and dry.  Psychiatric: Judgment normal. His affect is blunt. His speech is delayed. He is slowed. Thought content is not paranoid. Cognition and memory are normal. He expresses no homicidal and no suicidal ideation.     Assessment/Plan Treatment today  and then follow-up in about 2 weeks  Mordecai RasmussenJohn Clapacs, MD 04/02/2019, 11:04 AM

## 2019-04-08 ENCOUNTER — Other Ambulatory Visit: Payer: Self-pay

## 2019-04-08 ENCOUNTER — Encounter (HOSPITAL_COMMUNITY): Payer: Self-pay | Admitting: Psychiatry

## 2019-04-08 ENCOUNTER — Ambulatory Visit (INDEPENDENT_AMBULATORY_CARE_PROVIDER_SITE_OTHER): Payer: 59 | Admitting: Psychiatry

## 2019-04-08 DIAGNOSIS — F411 Generalized anxiety disorder: Secondary | ICD-10-CM

## 2019-04-08 DIAGNOSIS — F99 Mental disorder, not otherwise specified: Secondary | ICD-10-CM

## 2019-04-08 DIAGNOSIS — F410 Panic disorder [episodic paroxysmal anxiety] without agoraphobia: Secondary | ICD-10-CM | POA: Diagnosis not present

## 2019-04-08 DIAGNOSIS — F5105 Insomnia due to other mental disorder: Secondary | ICD-10-CM | POA: Diagnosis not present

## 2019-04-08 DIAGNOSIS — F3181 Bipolar II disorder: Secondary | ICD-10-CM | POA: Diagnosis not present

## 2019-04-08 NOTE — Progress Notes (Signed)
Virtual Visit via Video Note  I connected with Matthew [name] on 04/08/19 at  7:30 AM EDT by a video enabled telemedicine application and verified that I am speaking with the correct person using two identifiers.  Location: Patient: home Provider: office   I discussed the limitations of evaluation and management by telemedicine and the availability of in person appointments. The patient expressed understanding and agreed to proceed.  History of Present Illness: Matthew [name] reports that he is feeling better.  He had his second ECT treatment last week.  He discussed the matter with his wife and they both agreed that although there were signs of improvement overall Matthew [name] was presenting very "flat".  After this recent treatment he states that he was feeling "flat" for a few days but now that the myalgias and fatigue have improved he is feeling better.  He finds that he is not as irritable and is able to control the reaction to the irritability better.  He also finds that he is more interested in doing different activities.  He is making sure that he is delegating work to others and he is taking breaks during the day.  By about 10:30 at night he finds that he is naturally feeling tired and will stop working and go to bed.  He is doing work on his thesis and states that it is going well.  He is having anxiety related to work and his thesis but it is manageable.  He is not taking Ativan as much as he was before.  Matthew [name] denies any manic or hypomanic-like symptoms.  He is taking his medications as prescribed and feels that they are effective.  He denies SI/HI.  Matthew [name] is scheduled for another ECT treatment in 2 weeks.  He will determine whether or not he needs it closer to that date.   Observations/Objective: I spoke with Matthew [name] on a video call.  Pt was calm, pleasant and cooperative.  Pt was engaged in the conversation and answered questions appropriately.  He was dressed in casual  clothing and his hygiene appeared to be fair.  Eye contact was good.  Speech was clear and coherent with normal rate, tone and volume.  Mood is with improved depressed and anxiety, affect is congruent.  He was calmer and brighter than at our previous visit.  Thought processes are coherent, goal oriented and intact.  Thought content is logical.  Pt denies SI/HI.   Pt denies auditory and visual hallucinations and did not appear to be responding to internal stimuli.  Memory and concentration are good.  Fund of knowledge and use of language are average.  Insight and judgment are fair.  His psychomotor activity was normal.   Assessment and Plan: Bipolar 2 disorder-current episode depression; GAD; panic 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; insomnia; rule out ADHD  Status of current symptoms- improvement in depression and anxiety  Continue- Latuda 40 mg p.o. daily  Strattera 40 mg p.o. twice daily  BuSpar 15 mg p.o. twice daily  Depakote 250 mg every morning and 500 mg q. lunch  Ativan 1 mg p.o. 3 times daily as needed anxiety  Cialis 5 mg p.o. as needed sexual activity  Matthew [name] states that he does not need any refills at this time  Follow Up Instructions: In 2 weeks or sooner if needed   I discussed the assessment and treatment plan with the patient. The patient was provided an opportunity to ask questions and all were answered. The patient agreed with the  plan and demonstrated an understanding of the instructions.   The patient was advised to call back or seek an in-person evaluation if the symptoms worsen or if the condition fails to improve as anticipated.  I provided 20 minutes of non-face-to-face time during this encounter.   Charlcie Cradle, MD

## 2019-04-14 ENCOUNTER — Telehealth: Payer: Self-pay

## 2019-04-15 ENCOUNTER — Other Ambulatory Visit: Payer: Self-pay | Admitting: Psychiatry

## 2019-04-16 ENCOUNTER — Encounter: Payer: Self-pay | Admitting: Anesthesiology

## 2019-04-16 ENCOUNTER — Inpatient Hospital Stay: Admission: RE | Admit: 2019-04-16 | Payer: 59 | Source: Ambulatory Visit

## 2019-04-16 ENCOUNTER — Other Ambulatory Visit: Admission: RE | Admit: 2019-04-16 | Payer: 59 | Source: Ambulatory Visit

## 2019-04-19 ENCOUNTER — Telehealth: Payer: Self-pay

## 2019-04-19 MED FILL — LATUDA 40 MG TABLET: 40 | 30 days supply | Qty: 30 | Fill #1

## 2019-04-22 ENCOUNTER — Other Ambulatory Visit: Payer: Self-pay | Admitting: Psychiatry

## 2019-04-22 ENCOUNTER — Ambulatory Visit (INDEPENDENT_AMBULATORY_CARE_PROVIDER_SITE_OTHER): Payer: 59 | Admitting: Psychiatry

## 2019-04-22 ENCOUNTER — Other Ambulatory Visit: Payer: Self-pay

## 2019-04-22 ENCOUNTER — Encounter (HOSPITAL_COMMUNITY): Payer: Self-pay | Admitting: Psychiatry

## 2019-04-22 DIAGNOSIS — F3181 Bipolar II disorder: Secondary | ICD-10-CM | POA: Diagnosis not present

## 2019-04-22 DIAGNOSIS — F5105 Insomnia due to other mental disorder: Secondary | ICD-10-CM | POA: Diagnosis not present

## 2019-04-22 DIAGNOSIS — F410 Panic disorder [episodic paroxysmal anxiety] without agoraphobia: Secondary | ICD-10-CM

## 2019-04-22 DIAGNOSIS — R41840 Attention and concentration deficit: Secondary | ICD-10-CM

## 2019-04-22 DIAGNOSIS — F411 Generalized anxiety disorder: Secondary | ICD-10-CM | POA: Diagnosis not present

## 2019-04-22 DIAGNOSIS — F99 Mental disorder, not otherwise specified: Secondary | ICD-10-CM

## 2019-04-22 MED ORDER — ATOMOXETINE HCL 40 MG PO CAPS: 40.0000 mg | ORAL_CAPSULE | Freq: Two times a day (BID) | ORAL | 0 refills | Status: DC

## 2019-04-22 MED ORDER — BUSPIRONE HCL 15 MG PO TABS: 15.0000 mg | ORAL_TABLET | Freq: Every day | ORAL | 0 refills | Status: DC

## 2019-04-22 MED ORDER — DIVALPROEX SODIUM ER 250 MG PO TB24: ORAL_TABLET | ORAL | 0 refills | Status: DC

## 2019-04-22 MED ORDER — LORAZEPAM 1 MG PO TABS: 1.0000 mg | ORAL_TABLET | Freq: Three times a day (TID) | ORAL | 2 refills | Status: DC | PRN

## 2019-04-22 MED FILL — LORAZEPAM 1 MG TABS: 1 | 30 days supply | Qty: 90 | Fill #0

## 2019-04-22 NOTE — Progress Notes (Signed)
Virtual Visit via Telephone Note  I connected with Matthew [name] on 04/22/19 at  7:30 AM EDT by telephone and verified that I am speaking with the correct person using two identifiers.  Location: Patient: Home Provider: Office   I discussed the limitations, risks, security and privacy concerns of performing an evaluation and management service by telephone and the availability of in person appointments. I also discussed with the patient that there may be a patient responsible charge related to this service. The patient expressed understanding and agreed to proceed.   History of Present Illness: "It's been rough". Kids and wife told him he has been distant. Pt reports increased anxiety and depressed. He cried on Saturday due to feeling overwhelmed. He is always tired and unmotivated. Even if he takes a nap it doesn't help. Purnell is not interest in doing the things that he usually enjoys. He has been wanting to isolate from people outside of his family. He canceled an ECT treatment last Wednesday because he was feeling ok. For the last once week he states all this has been worse and constant. Since then things have been on a decline. He is now scheduled to have an ECT treatment on Friday. Fabion and wife believe the Anette Guarneri is not helping and may be making things worse. He feels he has been more irritable since starting Latuda. Ladislav denies any manic or hypomanic symptoms. He is reporting some passive SI without plan or intent. He doesn't care if he dies right now. Brison denies HI. He is having a lot of anxiety. He is having back and chest pain. Pt has no appetite and is only eating one meal a day because he forces himself to do it. Pt reports the anxiety is pretty constant and Ativan is now only taking the "edge off". He denies any panic attacks. He is taking Ativan TID. Work load is better but he is unmotivated. It could be due to anxiety or the depression.     Observations/Objective: I  spoke with Matthew [name] on the phone.  Pt was calm, pleasant and cooperative.  Pt was engaged in the conversation and answered questions appropriately.  Speech was clear and coherent with normal rate, tone and volume.  Mood is depressed and anxious, affect is congruent. Thought processes are coherent, goal oriented and intact.  Thought content is logical.  He is reporting passive SI without plan or intent. Pt denies HI.   Pt denies auditory and visual hallucinations and did not appear to be responding to internal stimuli.  Memory and concentration are good.  Fund of knowledge and use of language are average.  Insight and judgment are fair.  I am unable to comment on psychomotor activity, general appearance, hygiene, or eye contact as I was unable to physically see the patient on the phone.  Vital signs not available since interview conducted virtually.    Assessment and Plan:   Bipolar 2 disorder-current episode depressed; GAD; panic 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; insomnia; rule out ADHD  Status of current symptoms: overall worsening of all symptoms  I reviewed the records   Continue: D/c Latuda   Strattera 40 mg p.o. twice daily for mood and concentration issues  BuSpar 15 mg p.o. twice daily for  anxiety and augmentation agent for depression  Depakote 250 mg p.o. every morning and 500 mg p.o. q. lunch for bipolar 2 disorder. Recommended he stop afternoon and morning dose prior to ECT  Ativan 1 mg p.o. 3 times  daily as needed anxiety and panic attacks. Recommended he stop nighttime and morning dose of Ativan prior to ECT treatments.  Cialis 5 mg p.o. as needed sexual activity  May restart antidepressant due to ongoing  depression- previous treated with Lithium and Effexor  He is doing maintaince ECT and thinks he might need to do more treatments  Unclear but Depakote and Ativan maybe preventing Estiben from gaining the full effect of an ECT treatment?  I will discuss with Dr.  Toni Amendlapacs  Follow Up Instructions: In 1 weeks or sooner if needed   I discussed the assessment and treatment plan with the patient. The patient was provided an opportunity to ask questions and all were answered. The patient agreed with the plan and demonstrated an understanding of the instructions.   The patient was advised to call back or seek an in-person evaluation if the symptoms worsen or if the condition fails to improve as anticipated.  I provided 30 minutes of non-face-to-face time during this encounter.   Oletta DarterSalina Gissell Barra, MD

## 2019-04-23 ENCOUNTER — Other Ambulatory Visit
Admission: RE | Admit: 2019-04-23 | Discharge: 2019-04-23 | Disposition: A | Discharge status: Home / Self Care | Payer: 59 | Source: Ambulatory Visit | Attending: Psychiatry | Admitting: Psychiatry

## 2019-04-29 ENCOUNTER — Encounter (HOSPITAL_COMMUNITY): Payer: Self-pay | Admitting: Psychiatry

## 2019-04-29 ENCOUNTER — Ambulatory Visit (INDEPENDENT_AMBULATORY_CARE_PROVIDER_SITE_OTHER): Payer: 59 | Admitting: Psychiatry

## 2019-04-29 ENCOUNTER — Other Ambulatory Visit: Payer: Self-pay

## 2019-04-29 DIAGNOSIS — F410 Panic disorder [episodic paroxysmal anxiety] without agoraphobia: Secondary | ICD-10-CM | POA: Diagnosis not present

## 2019-04-29 DIAGNOSIS — F3181 Bipolar II disorder: Secondary | ICD-10-CM

## 2019-04-29 DIAGNOSIS — F411 Generalized anxiety disorder: Secondary | ICD-10-CM

## 2019-04-29 NOTE — Progress Notes (Signed)
Virtual Visit via Video Note  I connected with [name] on 04/29/19 at  8:00 AM EDT by a video enabled telemedicine application and verified that I am speaking with the correct person using two identifiers.  Location: Patient: Home Provider: Office   I discussed the limitations of evaluation and management by telemedicine and the availability of in person appointments. The patient expressed understanding and agreed to proceed.  History of Present Illness: I had a tele-psych video conference with Matthew [name].  He tells me that his depression is ongoing but not as intense as it was 2 weeks ago.  He is engaging a little bit more with the family.  He denies the desire to "run away or hide".  Matthew [name] denies SI/HI.  Matthew [name] is denying manic and hypomanic-like symptoms.  He "took a drug holiday "for 3 days last weekend.  He states he was tired of taking medications and wanted to see what would happen.  On the fourth day he noticed that his anxiety was worsening.  He is denying any panic attacks.  At that point he restarted all his medications but Depakote.  When I asked him why Depakote specifically he states that there was no reason.  He has ongoing stressors regarding work, family and his thesis.  It is overwhelming but he is trying to use positive coping skills.  He stopped the [place] and reported mild tremors that resolved after a day or 2.  Matthew [name] did not go to his last scheduled ECT appointment due to COVID-like symptoms of cough and sore throat.  Today we discussed his symptom presentation over the last several months.  I expressed my concern over how rapid his depression comes on and how intense it can be.  I recommended that he complete a full course of ECT as he benefited when he completed 6 treatments over 2 weeks.  Matthew [name] is agreeable to the idea.  He states that he hates the "loss of control and recovery process".  [place] will call the nurse coordinator to schedule the next ECT treatment and  discussed with Dr. Weber Cooks.    Observations/Objective:  There were no vitals taken for this visit.There is no height or weight on file to calculate BMI.  General Appearance: Casual and wearing a beanie over his hair and a tshirt. hygiene appeared to be good  Eye Contact:  Good  Speech:  Clear and Coherent, Normal Rate and somewhat monotone as per his usual  Volume:  Normal  Mood:  Anxious and Depressed  Affect:  Congruent  Thought Process:  Goal Directed and Descriptions of Associations: Intact  Orientation:  Full (Time, Place, and Person)  Thought Content:  Logical  Suicidal Thoughts:  No  Homicidal Thoughts:  No  Memory:  Immediate;   Good  Judgement:  Good  Insight:  Good  Psychomotor Activity:  Normal  Concentration:  Concentration: Good  Recall:  Good  Fund of Knowledge:  Good  Language:  Good  Akathisia:  No  Handed:  Right  AIMS (if indicated):     Assets:  Communication Skills Desire for Improvement Financial Resources/Insurance Housing Intimacy Physical Health Resilience Social Support Talents/Skills Transportation Vocational/Educational  ADL's:  Intact  Cognition:  WNL  Sleep:   good     Assessment and Plan: Bipolar 2 disorder-current episode depressed; GAD; panic 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; insomnia; rule out ADHD  Status of current symptoms: Ongoing depression and anxiety  I recommended that patient complete a full course of ECT.  I  encouraged him to speak with the ECT provider regarding his concerns.  Matthew MaduroRobert was agreeable and plan to schedule the appointment tomorrow.  Due to his diagnosis of bipolar 2 disorder he is not a good candidate for TMS.  If symptoms continue despite treatment with ECT I may recommend ketamine  Strattera 40 mg p.o. twice daily for mood and concentration issues  BuSpar 15 mg p.o. twice daily for anxiety and augmentation agent for depression  Depakote 250 mg p.o. every morning and 500 mg p.o. q. lunch for bipolar 2  disorder.  I encouraged him to discuss the use of this medication with Dr. Toni Amendlapacs to ensure best possible response to ECT  Ativan 1 mg p.o. 3 times daily as needed anxiety and panic attacks.  I encouraged him to discuss the use of this medication with Dr. Toni Amendlapacs to ensure best possible response to ECT  Cialis 5 mg p.o. as needed sexual activity  May consider restarting treatment with antidepressant-previously treated with lithium and Effexor   Follow Up Instructions: In 2-3 weeks or sooner if needed   I discussed the assessment and treatment plan with the patient. The patient was provided an opportunity to ask questions and all were answered. The patient agreed with the plan and demonstrated an understanding of the instructions.   The patient was advised to call back or seek an in-person evaluation if the symptoms worsen or if the condition fails to improve as anticipated.  I provided 35 minutes of non-face-to-face time during this encounter.   [name], MD

## 2019-04-30 ENCOUNTER — Telehealth: Payer: Self-pay

## 2019-05-03 ENCOUNTER — Other Ambulatory Visit: Payer: Self-pay | Admitting: Psychiatry

## 2019-05-03 ENCOUNTER — Other Ambulatory Visit
Admission: RE | Admit: 2019-05-03 | Discharge: 2019-05-03 | Disposition: A | Discharge status: Home / Self Care | Payer: 59 | Source: Ambulatory Visit | Attending: Psychiatry | Admitting: Psychiatry

## 2019-05-03 ENCOUNTER — Encounter: Payer: Self-pay | Admitting: Anesthesiology

## 2019-05-03 ENCOUNTER — Other Ambulatory Visit: Payer: Self-pay

## 2019-05-03 ENCOUNTER — Encounter
Admission: RE | Admit: 2019-05-03 | Discharge: 2019-05-03 | Disposition: A | Discharge status: Home / Self Care | Payer: 59 | Source: Ambulatory Visit | Attending: Psychiatry | Admitting: Psychiatry

## 2019-05-03 DIAGNOSIS — F319 Bipolar disorder, unspecified: Secondary | ICD-10-CM | POA: Diagnosis not present

## 2019-05-03 DIAGNOSIS — Z20828 Contact with and (suspected) exposure to other viral communicable diseases: Secondary | ICD-10-CM | POA: Insufficient documentation

## 2019-05-03 DIAGNOSIS — F3181 Bipolar II disorder: Secondary | ICD-10-CM | POA: Diagnosis not present

## 2019-05-03 DIAGNOSIS — F17210 Nicotine dependence, cigarettes, uncomplicated: Secondary | ICD-10-CM | POA: Insufficient documentation

## 2019-05-03 DIAGNOSIS — R45851 Suicidal ideations: Secondary | ICD-10-CM | POA: Insufficient documentation

## 2019-05-03 DIAGNOSIS — J449 Chronic obstructive pulmonary disease, unspecified: Secondary | ICD-10-CM | POA: Diagnosis not present

## 2019-05-03 DIAGNOSIS — Z01812 Encounter for preprocedural laboratory examination: Secondary | ICD-10-CM | POA: Insufficient documentation

## 2019-05-03 DIAGNOSIS — F419 Anxiety disorder, unspecified: Secondary | ICD-10-CM | POA: Insufficient documentation

## 2019-05-03 DIAGNOSIS — F332 Major depressive disorder, recurrent severe without psychotic features: Secondary | ICD-10-CM | POA: Diagnosis not present

## 2019-05-03 LAB — SARS CORONAVIRUS 2 BY RT PCR (HOSPITAL ORDER, PERFORMED IN ~~LOC~~ HOSPITAL LAB): SARS Coronavirus 2: NEGATIVE

## 2019-05-03 MED ORDER — GLYCOPYRROLATE 0.2 MG/ML IJ SOLN
INTRAMUSCULAR | Status: AC
  Filled 2019-05-03: qty 1

## 2019-05-03 MED ORDER — MIDAZOLAM HCL 2 MG/2ML IJ SOLN
INTRAMUSCULAR | Status: AC
  Filled 2019-05-03: qty 2

## 2019-05-03 MED ORDER — GLYCOPYRROLATE 0.2 MG/ML IJ SOLN
INTRAMUSCULAR | Status: AC
  Administered 2019-05-03: 0.3 mg via INTRAVENOUS
  Filled 2019-05-03: qty 1

## 2019-05-03 MED ORDER — SUCCINYLCHOLINE CHLORIDE 20 MG/ML IJ SOLN
INTRAMUSCULAR | Status: AC
  Filled 2019-05-03: qty 1

## 2019-05-03 MED ORDER — LABETALOL HCL 5 MG/ML IV SOLN
INTRAVENOUS | Status: AC
  Filled 2019-05-03: qty 4

## 2019-05-03 MED ORDER — SUCCINYLCHOLINE CHLORIDE 200 MG/10ML IV SOSY
PREFILLED_SYRINGE | INTRAVENOUS | Status: DC | PRN
  Administered 2019-05-03: 150 mg via INTRAVENOUS

## 2019-05-03 MED ORDER — KETOROLAC TROMETHAMINE 30 MG/ML IJ SOLN
30.0000 mg | Freq: Once | INTRAMUSCULAR | Status: AC
  Administered 2019-05-03: 11:00:00 30 mg via INTRAVENOUS

## 2019-05-03 MED ORDER — KETOROLAC TROMETHAMINE 30 MG/ML IJ SOLN
INTRAMUSCULAR | Status: AC
  Administered 2019-05-03: 11:00:00 30 mg via INTRAVENOUS
  Filled 2019-05-03: qty 1

## 2019-05-03 MED ORDER — MIDAZOLAM HCL 2 MG/2ML IJ SOLN
2.0000 mg | Freq: Once | INTRAMUSCULAR | Status: AC
  Administered 2019-05-03: 2 mg via INTRAVENOUS

## 2019-05-03 MED ORDER — METHOHEXITAL SODIUM 100 MG/10ML IV SOSY
PREFILLED_SYRINGE | INTRAVENOUS | Status: DC | PRN
  Administered 2019-05-03: 120 mg via INTRAVENOUS

## 2019-05-03 MED ORDER — SODIUM CHLORIDE 0.9 % IV SOLN
500.0000 mL | Freq: Once | INTRAVENOUS | Status: AC
  Administered 2019-05-03: 10:00:00 via INTRAVENOUS

## 2019-05-03 MED ORDER — GLYCOPYRROLATE 0.2 MG/ML IJ SOLN
0.3000 mg | Freq: Once | INTRAMUSCULAR | Status: AC
  Administered 2019-05-03: 11:00:00 0.3 mg via INTRAVENOUS

## 2019-05-03 NOTE — Anesthesia Postprocedure Evaluation (Signed)
Anesthesia Post Note  Patient: Matthew [name]  Procedure(s) Performed: ECT TX  Patient location during evaluation: PACU Anesthesia Type: General Level of consciousness: awake and alert Pain management: pain level controlled Vital Signs Assessment: post-procedure vital signs reviewed and stable Respiratory status: spontaneous breathing, nonlabored ventilation and respiratory function stable Cardiovascular status: blood pressure returned to baseline and stable Postop Assessment: no signs of nausea or vomiting Anesthetic complications: no     Last Vitals:  Vitals:   05/03/19 1214 05/03/19 1226  BP: (!) 124/92 120/80  Pulse: 66 71  Resp: 12 18  Temp: 36.7 C 36.7 C  SpO2: 100%     Last Pain:  Vitals:   05/03/19 1226  TempSrc: Oral  PainSc: 0-No pain                 Margaret Staggs

## 2019-05-03 NOTE — Procedures (Signed)
ECT SERVICES Physician's Interval Evaluation & Treatment Note  Patient Identification: Matthew [name] MRN:  [id number] Date of Evaluation:  05/03/2019 TX #: 1  MADRS:   MMSE:   P.E. Findings:  No change to physical exam normal no findings  Psychiatric Interval Note:  Very depressed down flat  Subjective:  Patient is a 48 y.o. male seen for evaluation for Electroconvulsive Therapy. Acknowledges worse depression.  Passive suicidal ideation no intent or plan  Treatment Summary:   [x]   Right Unilateral             []  Bilateral   % Energy : 0.3 ms 50%   Impedance: 1770 ohms  Seizure Energy Index: 8487 V squared  Postictal Suppression Index: 68%  Seizure Concordance Index: 89%  Medications  Pre Shock: Robinul 0.3 mg Toradol 30 mg Brevital 120 mg succinylcholine 150 mg  Post Shock: Versed 2 mg  Seizure Duration: About 40 seconds by muscle movement 76 seconds EEG   Comments: Follow-up Wednesday and Friday as outpatient for full course  Lungs:  [x]   Clear to auscultation               []  Other:   Heart:    [x]   Regular rhythm             []  irregular rhythm    [x]   Previous H&P reviewed, patient examined and there are NO CHANGES                 []   Previous H&P reviewed, patient examined and there are changes noted.   Alethia Berthold, MD 9/14/202011:34 AM

## 2019-05-03 NOTE — Transfer of Care (Signed)
Immediate Anesthesia Transfer of Care Note  Patient: Matthew [name]  Procedure(s) Performed: ECT TX  Patient Location: PACU  Anesthesia Type:General  Level of Consciousness: sedated  Airway & Oxygen Therapy: Patient Spontanous Breathing and Patient connected to face mask oxygen  Post-op Assessment: Report given to RN and Post -op Vital signs reviewed and stable  Post vital signs: Reviewed and stable  Last Vitals:  Vitals Value Taken Time  BP 118/95 05/03/19 1154  Temp 36.3 C 05/03/19 1153  Pulse 80 05/03/19 1155  Resp 13 05/03/19 1155  SpO2 100 % 05/03/19 1155  Vitals shown include unvalidated device data.  Last Pain:  Vitals:   05/03/19 0907  TempSrc:   PainSc: 0-No pain         Complications: No apparent anesthesia complications

## 2019-05-03 NOTE — Discharge Instructions (Signed)
1)  The drugs that you have been given will stay in your system until tomorrow so for the       next 24 hours you should not: ° A. Drive an automobile ° B. Make any legal decisions ° C. Drink any alcoholic beverages ° °2)  You may resume your regular meals upon return home. ° °3)  A responsible adult must take you home.  Someone should stay with you for a few          hours, then be available by phone for the remainder of the treatment day. ° °4)  You May experience any of the following symptoms: ° Headache, Nausea and a dry mouth (due to the medications you were given),  temporary memory loss and some confusion, or sore muscles (a warm bath  should help this).  If you you experience any of these symptoms let us know on                your return visit. ° °5)  Report any of the following: any acute discomfort, severe headache, or temperature        greater than 100.5 F.   Also report any unusual redness, swelling, drainage, or pain         at your IV site. ° °  You may report Symptoms to:  ECT PROGRAM-  at [facility] °         Phone: [phone], ECT Department  °         or Dr. Clapac's office [phone] ° °6)  Your next ECT Treatment is Wednesday September 16 at 8:30  ° We will call 2 days prior to your scheduled appointment for arrival times. ° °7)  Nothing to eat or drink after midnight the night before your procedure. ° °8)  Take     With a sip of water the morning of your procedure. ° °9)  Other Instructions: Call [phone] to cancel the morning of your procedure due         to illness or emergency. ° °10) We will call within 72 hours to assess how you are feeling.  °

## 2019-05-03 NOTE — H&P (Signed)
Matthew [name] is an 48 y.o. male.   Chief Complaint: chronic depression with no presistant response to medication  HPI: recurrent depression  Past Medical History:  Diagnosis Date  . Acute kidney failure (HCC) 01/2012  . Anxiety   . Bipolar disorder (HCC)    hypomania  . COPD (chronic obstructive pulmonary disease) (HCC)   . Depression   . Low back pain     Past Surgical History:  Procedure Laterality Date  . NO PAST SURGERIES      Family History  Problem Relation Age of Onset  . Anxiety disorder Mother   . Alcohol abuse Father   . Drug abuse Father   . Bipolar disorder Sister   . Alcohol abuse Brother   . Drug abuse Brother   . Bipolar disorder Paternal Aunt   . Schizophrenia Paternal Aunt   . Cancer Maternal Grandmother        lung  . Hypertension Maternal Grandfather   . Cancer Paternal Grandmother        lung  . Hypertension Paternal Grandfather   . Diabetes Neg Hx   . Heart disease Neg Hx   . Hyperlipidemia Neg Hx   . Stroke Neg Hx   . Suicidality Neg Hx    Social History:  reports that he has been smoking cigarettes. He has a 20.00 pack-year smoking history. He has never used smokeless tobacco. He reports that he does not drink alcohol or use drugs.  Allergies:  Allergies  Allergen Reactions  . Ciprofloxacin     Acute kidney failure    (Not in a hospital admission)   Results for orders placed or performed during the hospital encounter of 05/03/19 (from the past 48 hour(s))  SARS Coronavirus 2 [facility](Hospital order, [facility]) Nasopharyngeal Nasopharyngeal Swab     Status: None   Collection Time: 05/03/19  8:10 AM   Specimen: Nasopharyngeal Swab  Result Value Ref Range   SARS Coronavirus 2 NEGATIVE NEGATIVE    Comment: (NOTE) If result is NEGATIVE SARS-CoV-2 target nucleic acids are NOT DETECTED. The SARS-CoV-2 RNA is generally detectable in upper and lower  respiratory specimens during the acute phase of infection.  The lowest  concentration of SARS-CoV-2 viral copies this assay can detect is 250  copies / mL. A negative result does not preclude SARS-CoV-2 infection  and should not be used as the sole basis for treatment or other  patient management decisions.  A negative result may occur with  improper specimen collection / handling, submission of specimen other  than nasopharyngeal swab, presence of viral mutation(s) within the  areas targeted by this assay, and inadequate number of viral copies  (<250 copies / mL). A negative result must be combined with clinical  observations, patient history, and epidemiological information. If result is POSITIVE SARS-CoV-2 target nucleic acids are DETECTED. The SARS-CoV-2 RNA is generally detectable in upper and lower  respiratory specimens dur ing the acute phase of infection.  Positive  results are indicative of active infection with SARS-CoV-2.  Clinical  correlation with patient history and other diagnostic information is  necessary to determine patient infection status.  Positive results do  not rule out bacterial infection or co-infection with other viruses. If result is PRESUMPTIVE POSTIVE SARS-CoV-2 nucleic acids MAY BE PRESENT.   A presumptive positive result was obtained on the submitted specimen  and confirmed on repeat testing.  While 2019 novel coronavirus  (SARS-CoV-2) nucleic acids may be present in the  submitted sample  additional confirmatory testing may be necessary for epidemiological  and / or clinical management purposes  to differentiate between  SARS-CoV-2 and other Sarbecovirus currently known to infect humans.  If clinically indicated additional testing with an alternate test  methodology [phone]) is advised. The SARS-CoV-2 RNA is generally  detectable in upper and lower respiratory sp ecimens during the acute  phase of infection. The expected result is Negative. Fact Sheet for Patients:   StrictlyIdeas.no Fact Sheet for Healthcare Providers: BankingDealers.co.za This test is not yet approved or cleared by the [place] FDA and has been authorized for detection and/or diagnosis of SARS-CoV-2 by FDA under an Emergency Use Authorization (EUA).  This EUA will remain in effect (meaning this test can be used) for the duration of the COVID-19 declaration under Section 564(b)(1) of the Act, 21 U.S.C. section 360bbb-3(b)(1), unless the authorization is terminated or revoked sooner. Performed at [facility], [address] [place], [place] [address]    No results found.  Review of Systems  Constitutional: Positive for malaise/fatigue.  HENT: Negative.   Eyes: Negative.   Respiratory: Negative.   Cardiovascular: Negative.   Gastrointestinal: Negative.   Musculoskeletal: Negative.   Skin: Negative.   Neurological: Negative.   Psychiatric/Behavioral: Positive for depression and suicidal ideas. Negative for hallucinations, memory loss and substance abuse. The patient is nervous/anxious. The patient does not have insomnia.     Blood pressure 125/79, pulse 75, temperature 97.7 F (36.5 C), temperature source Oral, resp. rate 18, SpO2 98 %. Physical Exam  Nursing note and vitals reviewed. Constitutional: He appears well-developed and well-nourished.  HENT:  Head: Normocephalic and atraumatic.  Eyes: Pupils are equal, round, and reactive to light. Conjunctivae are normal.  Neck: Normal range of motion.  Cardiovascular: Normal heart sounds.  Respiratory: Effort normal.  GI: Soft.  Musculoskeletal: Normal range of motion.  Neurological: He is alert.  Skin: Skin is warm and dry.  Psychiatric: Judgment normal. His affect is blunt. His speech is delayed. He is slowed. Cognition and memory are normal. He expresses no suicidal plans.     Assessment/Plan Start today with plan for full course  Alethia Berthold,  MD 05/03/2019, 10:00 AM

## 2019-05-03 NOTE — Anesthesia Preprocedure Evaluation (Signed)
Anesthesia Evaluation  Patient identified by MRN, date of birth, ID band Patient awake    Reviewed: Allergy & Precautions, NPO status , Patient's Chart, lab work & pertinent test results  History of Anesthesia Complications Negative for: history of anesthetic complications  Airway Mallampati: III  TM Distance: >3 FB Neck ROM: Full    Dental no notable dental hx.    Pulmonary asthma , neg sleep apnea, COPD,  COPD inhaler, Current Smoker and Patient abstained from smoking.,    breath sounds clear to auscultation- rhonchi (-) wheezing      Cardiovascular Exercise Tolerance: Good (-) hypertension(-) CAD, (-) Past MI, (-) Cardiac Stents and (-) CABG  Rhythm:Regular Rate:Normal - Systolic murmurs and - Diastolic murmurs    Neuro/Psych neg Seizures PSYCHIATRIC DISORDERS Anxiety Depression Bipolar Disorder negative neurological ROS     GI/Hepatic negative GI ROS, Neg liver ROS,   Endo/Other  negative endocrine ROSneg diabetes  Renal/GU negative Renal ROS     Musculoskeletal negative musculoskeletal ROS (+)   Abdominal (+) - obese,   Peds  Hematology negative hematology ROS (+)   Anesthesia Other Findings Past Medical History: 01/2012: Acute kidney failure (HCC) No date: Anxiety No date: Bipolar disorder (HCC)     Comment:  hypomania No date: COPD (chronic obstructive pulmonary disease) (HCC) No date: Depression No date: Low back pain   Reproductive/Obstetrics                             Anesthesia Physical  Anesthesia Plan  ASA: II  Anesthesia Plan: General   Post-op Pain Management:    Induction: Intravenous  PONV Risk Score and Plan: 0 and Ondansetron  Airway Management Planned: Mask  Additional Equipment:   Intra-op Plan:   Post-operative Plan:   Informed Consent: I have reviewed the patients History and Physical, chart, labs and discussed the procedure including the risks,  benefits and alternatives for the proposed anesthesia with the patient or authorized representative who has indicated his/her understanding and acceptance.     Dental advisory given  Plan Discussed with: CRNA and Anesthesiologist  Anesthesia Plan Comments:         Anesthesia Quick Evaluation  

## 2019-05-03 NOTE — Anesthesia Post-op Follow-up Note (Signed)
Anesthesia [facility] form completed.        

## 2019-05-03 NOTE — Anesthesia Procedure Notes (Signed)
Date/Time: 05/03/2019 11:42 AM Performed by: Dionne Bucy, CRNA Pre-anesthesia Checklist: Patient identified, Emergency Drugs available, Suction available and Patient being monitored Patient Re-evaluated:Patient Re-evaluated prior to induction Oxygen Delivery Method: Circle system utilized Preoxygenation: Pre-oxygenation with 100% oxygen Induction Type: IV induction Ventilation: Mask ventilation without difficulty and Mask ventilation throughout procedure Airway Equipment and Method: Bite block Placement Confirmation: positive ETCO2 Dental Injury: Teeth and Oropharynx as per pre-operative assessment

## 2019-05-04 ENCOUNTER — Other Ambulatory Visit: Payer: Self-pay | Admitting: Psychiatry

## 2019-05-04 MED FILL — LORAZEPAM 1 MG TABS: 1 | 30 days supply | Qty: 90 | Fill #0

## 2019-05-04 MED FILL — BACLOFEN 10 MG TABS: 10 | 15 days supply | Qty: 45 | Fill #1

## 2019-05-04 MED FILL — TADALAFIL 5 MG TABS: 5 | 10 days supply | Qty: 10 | Fill #1

## 2019-05-05 ENCOUNTER — Encounter: Payer: Self-pay | Admitting: Anesthesiology

## 2019-05-05 ENCOUNTER — Other Ambulatory Visit: Payer: Self-pay

## 2019-05-05 ENCOUNTER — Encounter (HOSPITAL_BASED_OUTPATIENT_CLINIC_OR_DEPARTMENT_OTHER)
Admission: RE | Admit: 2019-05-05 | Discharge: 2019-05-05 | Disposition: A | Discharge status: Home / Self Care | Payer: 59 | Source: Ambulatory Visit | Attending: Psychiatry | Admitting: Psychiatry

## 2019-05-05 ENCOUNTER — Other Ambulatory Visit: Payer: Self-pay | Admitting: Psychiatry

## 2019-05-05 DIAGNOSIS — F332 Major depressive disorder, recurrent severe without psychotic features: Secondary | ICD-10-CM | POA: Diagnosis not present

## 2019-05-05 DIAGNOSIS — F319 Bipolar disorder, unspecified: Secondary | ICD-10-CM | POA: Diagnosis not present

## 2019-05-05 DIAGNOSIS — R45851 Suicidal ideations: Secondary | ICD-10-CM | POA: Diagnosis not present

## 2019-05-05 DIAGNOSIS — F419 Anxiety disorder, unspecified: Secondary | ICD-10-CM | POA: Diagnosis not present

## 2019-05-05 DIAGNOSIS — F3181 Bipolar II disorder: Secondary | ICD-10-CM | POA: Diagnosis not present

## 2019-05-05 DIAGNOSIS — Z20828 Contact with and (suspected) exposure to other viral communicable diseases: Secondary | ICD-10-CM | POA: Diagnosis not present

## 2019-05-05 DIAGNOSIS — Z01812 Encounter for preprocedural laboratory examination: Secondary | ICD-10-CM | POA: Diagnosis not present

## 2019-05-05 DIAGNOSIS — J449 Chronic obstructive pulmonary disease, unspecified: Secondary | ICD-10-CM | POA: Diagnosis not present

## 2019-05-05 DIAGNOSIS — F17210 Nicotine dependence, cigarettes, uncomplicated: Secondary | ICD-10-CM | POA: Diagnosis not present

## 2019-05-05 MED ORDER — KETOROLAC TROMETHAMINE 30 MG/ML IJ SOLN
30.0000 mg | Freq: Once | INTRAMUSCULAR | Status: AC
  Administered 2019-05-05: 30 mg via INTRAVENOUS

## 2019-05-05 MED ORDER — GLYCOPYRROLATE 0.2 MG/ML IJ SOLN
0.3000 mg | Freq: Once | INTRAMUSCULAR | Status: AC
  Administered 2019-05-05: 0.3 mg via INTRAVENOUS

## 2019-05-05 MED ORDER — ONDANSETRON HCL 4 MG/2ML IJ SOLN: 4.0000 mg | Freq: Once | INTRAMUSCULAR | Status: DC | PRN

## 2019-05-05 MED ORDER — SUCCINYLCHOLINE CHLORIDE 200 MG/10ML IV SOSY
PREFILLED_SYRINGE | INTRAVENOUS | Status: DC | PRN
  Administered 2019-05-05: 150 mg via INTRAVENOUS

## 2019-05-05 MED ORDER — GLYCOPYRROLATE 0.2 MG/ML IJ SOLN
INTRAMUSCULAR | Status: AC
  Filled 2019-05-05: qty 2

## 2019-05-05 MED ORDER — METHOHEXITAL SODIUM 100 MG/10ML IV SOSY
PREFILLED_SYRINGE | INTRAVENOUS | Status: DC | PRN
  Administered 2019-05-05: 120 mg via INTRAVENOUS

## 2019-05-05 MED ORDER — SODIUM CHLORIDE 0.9 % IV SOLN
500.0000 mL | Freq: Once | INTRAVENOUS | Status: AC
  Administered 2019-05-05: 500 mL via INTRAVENOUS

## 2019-05-05 MED ORDER — LABETALOL HCL 5 MG/ML IV SOLN
INTRAVENOUS | Status: DC | PRN
  Administered 2019-05-05: 5 mg via INTRAVENOUS
  Administered 2019-05-05: 10 mg via INTRAVENOUS

## 2019-05-05 MED ORDER — LABETALOL HCL 5 MG/ML IV SOLN
INTRAVENOUS | Status: AC
  Filled 2019-05-05: qty 4

## 2019-05-05 MED ORDER — MIDAZOLAM HCL 2 MG/2ML IJ SOLN
2.0000 mg | Freq: Once | INTRAMUSCULAR | Status: AC
  Administered 2019-05-05: 2 mg via INTRAVENOUS

## 2019-05-05 MED ORDER — SUCCINYLCHOLINE CHLORIDE 20 MG/ML IJ SOLN
INTRAMUSCULAR | Status: AC
  Filled 2019-05-05: qty 1

## 2019-05-05 MED ORDER — KETOROLAC TROMETHAMINE 30 MG/ML IJ SOLN
INTRAMUSCULAR | Status: AC
  Filled 2019-05-05: qty 1

## 2019-05-05 MED ORDER — SODIUM CHLORIDE 0.9 % IV SOLN
INTRAVENOUS | Status: DC | PRN
  Administered 2019-05-05: 11:00:00 via INTRAVENOUS

## 2019-05-05 MED ORDER — MIDAZOLAM HCL 2 MG/2ML IJ SOLN
INTRAMUSCULAR | Status: AC
  Filled 2019-05-05: qty 2

## 2019-05-05 NOTE — Procedures (Signed)
ECT SERVICES Physician's Interval Evaluation & Treatment Note  Patient Identification: Matthew [name] MRN:  [id number] Date of Evaluation:  05/05/2019 TX #: 2  MADRS:   MMSE:   P.E. Findings:  No change physical exam  Psychiatric Interval Note:  Affect about the same perhaps a little less grumpy and irritable  Subjective:  Patient is a 48 y.o. male seen for evaluation for Electroconvulsive Therapy. Had some pain after the last treatment.  Subjectively acknowledges that his mood might be slightly better  Treatment Summary:   [x]   Right Unilateral             []  Bilateral   % Energy : 0.3 ms 50%   Impedance: 1160 ohms  Seizure Energy Index: 4324 V squared  Postictal Suppression Index: 58%  Seizure Concordance Index: 96%  Medications  Pre Shock: Robinul 0.3 mg Toradol 30 mg Brevital 120 mg succinylcholine 150 mg  Post Shock: Versed 2 mg  Seizure Duration: EMG 37 seconds EEG 68 seconds   Comments: Follow-up on Friday  Lungs:  [x]   Clear to auscultation               []  Other:   Heart:    [x]   Regular rhythm             []  irregular rhythm    [x]   Previous H&P reviewed, patient examined and there are NO CHANGES                 []   Previous H&P reviewed, patient examined and there are changes noted.   Alethia Berthold, MD 9/16/202011:06 AM

## 2019-05-05 NOTE — Anesthesia Post-op Follow-up Note (Signed)
Anesthesia [facility] form completed.        

## 2019-05-05 NOTE — Transfer of Care (Signed)
Immediate Anesthesia Transfer of Care Note  Patient: Matthew [name]  Procedure(s) Performed: ECT TX  Patient Location: PACU  Anesthesia Type:General  Level of Consciousness: sedated  Airway & Oxygen Therapy: Patient Spontanous Breathing and Patient connected to face mask oxygen  Post-op Assessment: Report given to RN and Post -op Vital signs reviewed and stable  Post vital signs: Reviewed and stable  Last Vitals:  Vitals Value Taken Time  BP 146/90 05/05/19 1128  Temp 36.8 C 05/05/19 1128  Pulse 84 05/05/19 1131  Resp 15 05/05/19 1131  SpO2 100 % 05/05/19 1131  Vitals shown include unvalidated device data.  Last Pain:  Vitals:   05/05/19 1128  TempSrc:   PainSc: 0-No pain      Patients Stated Pain Goal: 0 (03/09/56 5051)  Complications: No apparent anesthesia complications

## 2019-05-05 NOTE — Anesthesia Procedure Notes (Signed)
Date/Time: 05/05/2019 11:17 AM Performed by: Dionne Bucy, CRNA Pre-anesthesia Checklist: Patient identified, Emergency Drugs available, Suction available and Patient being monitored Patient Re-evaluated:Patient Re-evaluated prior to induction Oxygen Delivery Method: Circle system utilized Preoxygenation: Pre-oxygenation with 100% oxygen Induction Type: IV induction Ventilation: Mask ventilation without difficulty and Mask ventilation throughout procedure Airway Equipment and Method: Bite block Placement Confirmation: positive ETCO2 Dental Injury: Teeth and Oropharynx as per pre-operative assessment

## 2019-05-05 NOTE — Anesthesia Postprocedure Evaluation (Signed)
Anesthesia Post Note  Patient: Matthew [name]  Procedure(s) Performed: ECT TX  Patient location during evaluation: PACU Anesthesia Type: General Level of consciousness: awake and alert Pain management: pain level controlled Vital Signs Assessment: post-procedure vital signs reviewed and stable Respiratory status: spontaneous breathing and respiratory function stable Cardiovascular status: stable Anesthetic complications: no     Last Vitals:  Vitals:   05/05/19 1145 05/05/19 1151  BP: 119/82 110/81  Pulse: 89   Resp: 17 16  Temp: 37 C   SpO2: 98%     Last Pain:  Vitals:   05/05/19 1151  TempSrc:   PainSc: 0-No pain                 KEPHART,WILLIAM K

## 2019-05-05 NOTE — Discharge Instructions (Signed)
1)  The drugs that you have been given will stay in your system until tomorrow so for the       next 24 hours you should not:  A. Drive an automobile  B. Make any legal decisions  C. Drink any alcoholic beverages  2)  You may resume your regular meals upon return home.  3)  A responsible adult must take you home.  Someone should stay with you for a few          hours, then be available by phone for the remainder of the treatment day.  4)  You May experience any of the following symptoms:  Headache, Nausea and a dry mouth (due to the medications you were given),  temporary memory loss and some confusion, or sore muscles (a warm bath  should help this).  If you you experience any of these symptoms let us know on                your return visit.  5)  Report any of the following: any acute discomfort, severe headache, or temperature        greater than 100.5 F.   Also report any unusual redness, swelling, drainage, or pain         at your IV site.    You may report Symptoms to:  [place] [facility]          Phone: [phone], ECT Department           or Dr. Prescott Gum office [phone]  6)  Your next ECT Treatment is Friday  September 18 8:30   We will call 2 days prior to your scheduled appointment for arrival times.  7)  Nothing to eat or drink after midnight the night before your procedure.  8)  Take     With a sip of water the morning of your procedure.  9)  Other Instructions: Call [phone] to cancel the morning of your procedure due         to illness or emergency.  10) We will call within 72 hours to assess how you are feeling.

## 2019-05-05 NOTE — Anesthesia Preprocedure Evaluation (Signed)
Anesthesia Evaluation  Patient identified by MRN, date of birth, ID band Patient awake    Reviewed: Allergy & Precautions, NPO status , Patient's Chart, lab work & pertinent test results  History of Anesthesia Complications Negative for: history of anesthetic complications  Airway Mallampati: III  TM Distance: >3 FB Neck ROM: Full    Dental no notable dental hx.    Pulmonary asthma , neg sleep apnea, COPD,  COPD inhaler, Current Smoker and Patient abstained from smoking.,    - rhonchi (-) wheezing      Cardiovascular Exercise Tolerance: Good (-) hypertension(-) CAD, (-) Past MI, (-) Cardiac Stents and (-) CABG  - Systolic murmurs and - Diastolic murmurs    Neuro/Psych neg Seizures PSYCHIATRIC DISORDERS Anxiety Depression Bipolar Disorder negative neurological ROS     GI/Hepatic negative GI ROS, Neg liver ROS,   Endo/Other  negative endocrine ROSneg diabetes  Renal/GU negative Renal ROS     Musculoskeletal negative musculoskeletal ROS (+)   Abdominal (+) - obese,   Peds  Hematology negative hematology ROS (+)   Anesthesia Other Findings    Reproductive/Obstetrics                             Anesthesia Physical  Anesthesia Plan  ASA: II  Anesthesia Plan: General   Post-op Pain Management:    Induction: Intravenous  PONV Risk Score and Plan: 0 and Ondansetron  Airway Management Planned: Mask  Additional Equipment:   Intra-op Plan:   Post-operative Plan:   Informed Consent: I have reviewed the patients History and Physical, chart, labs and discussed the procedure including the risks, benefits and alternatives for the proposed anesthesia with the patient or authorized representative who has indicated his/her understanding and acceptance.       Plan Discussed with: CRNA and Anesthesiologist  Anesthesia Plan Comments:         Anesthesia Quick Evaluation

## 2019-05-05 NOTE — H&P (Signed)
Matthew [name] is an 48 y.o. male.   Chief Complaint: Continues with depression.  Had significant myalgias after last treatment as well HPI: History of recurrent severe depression good response to ECT  Past Medical History:  Diagnosis Date  . Acute kidney failure ([place]) 01/2012  . Anxiety   . Bipolar disorder ([place])    hypomania  . COPD (chronic obstructive pulmonary disease) ([place])   . Depression   . Low back pain     Past Surgical History:  Procedure Laterality Date  . NO PAST SURGERIES      Family History  Problem Relation Age of Onset  . Anxiety disorder Mother   . Alcohol abuse Father   . Drug abuse Father   . Bipolar disorder Sister   . Alcohol abuse Brother   . Drug abuse Brother   . Bipolar disorder Paternal Aunt   . Schizophrenia Paternal Aunt   . Cancer Maternal Grandmother        lung  . Hypertension Maternal Grandfather   . Cancer Paternal Grandmother        lung  . Hypertension Paternal Grandfather   . Diabetes Neg Hx   . Heart disease Neg Hx   . Hyperlipidemia Neg Hx   . Stroke Neg Hx   . Suicidality Neg Hx    Social History:  reports that he has been smoking cigarettes. He has a 20.00 pack-year smoking history. He has never used smokeless tobacco. He reports that he does not drink alcohol or use drugs.  Allergies:  Allergies  Allergen Reactions  . Ciprofloxacin     Acute kidney failure    (Not in a hospital admission)   No results found for this or any previous visit (from the past 48 hour(s)). No results found.  Review of Systems  Constitutional: Negative.   HENT: Negative.   Eyes: Negative.   Respiratory: Negative.   Cardiovascular: Negative.   Gastrointestinal: Negative.   Musculoskeletal: Negative.   Skin: Negative.   Neurological: Negative.   Psychiatric/Behavioral: Positive for depression and suicidal ideas. Negative for hallucinations, memory loss and substance abuse. The patient is not nervous/anxious and does not have  insomnia.     Blood pressure 121/89, pulse 83, temperature 97.9 F (36.6 C), temperature source Oral, resp. rate 18, height 6\' 5"  (1.956 m), weight 96.6 kg, SpO2 99 %. Physical Exam  Constitutional: He appears well-developed and well-nourished.  HENT:  Head: Normocephalic and atraumatic.  Eyes: Pupils are equal, round, and reactive to light. Conjunctivae are normal.  Neck: Normal range of motion.  Cardiovascular: Normal heart sounds.  Respiratory: Effort normal.  GI: Soft.  Musculoskeletal: Normal range of motion.  Neurological: He is alert.  Skin: Skin is warm and dry.  Psychiatric: Judgment normal. His affect is blunt. His speech is delayed. He is slowed. Cognition and memory are normal. He expresses suicidal ideation. He expresses no homicidal ideation. He expresses no suicidal plans.     Assessment/Plan Continue index course right unilateral no change to overall treatment plan.  Pain medicine provided prior to treatment.  Alethia Berthold, MD 05/05/2019, 10:54 AM

## 2019-05-06 ENCOUNTER — Telehealth (HOSPITAL_COMMUNITY): Payer: Self-pay

## 2019-05-06 NOTE — Telephone Encounter (Signed)
Patient called regarding Effexor Xr 75mg . He stated that he feels with starting back up with ECT that he would like to be put back on the medication. He is using [facility]. Please review and advise. Thank you.

## 2019-05-06 NOTE — Telephone Encounter (Signed)
Yes you can send in a script for Effexor XR 75mg  qD- 30 tabs with no refills

## 2019-05-07 ENCOUNTER — Encounter: Payer: Self-pay | Admitting: Anesthesiology

## 2019-05-07 ENCOUNTER — Other Ambulatory Visit: Payer: Self-pay

## 2019-05-07 ENCOUNTER — Other Ambulatory Visit (HOSPITAL_COMMUNITY): Payer: Self-pay

## 2019-05-07 ENCOUNTER — Encounter
Admission: RE | Admit: 2019-05-07 | Discharge: 2019-05-07 | Disposition: A | Discharge status: Home / Self Care | Payer: 59 | Source: Ambulatory Visit | Attending: Psychiatry | Admitting: Psychiatry

## 2019-05-07 DIAGNOSIS — Z01812 Encounter for preprocedural laboratory examination: Secondary | ICD-10-CM | POA: Diagnosis not present

## 2019-05-07 DIAGNOSIS — F319 Bipolar disorder, unspecified: Secondary | ICD-10-CM | POA: Diagnosis not present

## 2019-05-07 DIAGNOSIS — Z20828 Contact with and (suspected) exposure to other viral communicable diseases: Secondary | ICD-10-CM | POA: Diagnosis not present

## 2019-05-07 DIAGNOSIS — F17210 Nicotine dependence, cigarettes, uncomplicated: Secondary | ICD-10-CM | POA: Diagnosis not present

## 2019-05-07 DIAGNOSIS — R45851 Suicidal ideations: Secondary | ICD-10-CM | POA: Diagnosis not present

## 2019-05-07 DIAGNOSIS — J449 Chronic obstructive pulmonary disease, unspecified: Secondary | ICD-10-CM | POA: Diagnosis not present

## 2019-05-07 DIAGNOSIS — F419 Anxiety disorder, unspecified: Secondary | ICD-10-CM | POA: Diagnosis not present

## 2019-05-07 DIAGNOSIS — F418 Other specified anxiety disorders: Secondary | ICD-10-CM | POA: Diagnosis not present

## 2019-05-07 MED ORDER — METHOHEXITAL SODIUM 100 MG/10ML IV SOSY
PREFILLED_SYRINGE | INTRAVENOUS | Status: DC | PRN
  Administered 2019-05-07: 120 mg via INTRAVENOUS

## 2019-05-07 MED ORDER — GLYCOPYRROLATE 0.2 MG/ML IJ SOLN
0.3000 mg | Freq: Once | INTRAMUSCULAR | Status: AC
  Administered 2019-05-07: 09:00:00 0.3 mg via INTRAVENOUS

## 2019-05-07 MED ORDER — KETOROLAC TROMETHAMINE 30 MG/ML IJ SOLN
INTRAMUSCULAR | Status: AC
  Administered 2019-05-07: 30 mg via INTRAVENOUS
  Filled 2019-05-07: qty 1

## 2019-05-07 MED ORDER — METHOHEXITAL SODIUM 0.5 G IJ SOLR
INTRAMUSCULAR | Status: AC
  Filled 2019-05-07: qty 500

## 2019-05-07 MED ORDER — SODIUM CHLORIDE 0.9 % IV SOLN
500.0000 mL | Freq: Once | INTRAVENOUS | Status: AC
  Administered 2019-05-07: 09:00:00 500 mL via INTRAVENOUS

## 2019-05-07 MED ORDER — MIDAZOLAM HCL 2 MG/2ML IJ SOLN
INTRAMUSCULAR | Status: AC
  Filled 2019-05-07: qty 2

## 2019-05-07 MED ORDER — VENLAFAXINE HCL ER 75 MG PO CP24: 75.0000 mg | ORAL_CAPSULE | Freq: Every day | ORAL | 0 refills | Status: DC

## 2019-05-07 MED ORDER — MIDAZOLAM HCL 2 MG/2ML IJ SOLN
2.0000 mg | Freq: Once | INTRAMUSCULAR | Status: AC
  Administered 2019-05-07: 2 mg via INTRAVENOUS

## 2019-05-07 MED ORDER — KETOROLAC TROMETHAMINE 30 MG/ML IJ SOLN
30.0000 mg | Freq: Once | INTRAMUSCULAR | Status: AC
  Administered 2019-05-07: 09:00:00 30 mg via INTRAVENOUS

## 2019-05-07 MED ORDER — LABETALOL HCL 5 MG/ML IV SOLN
INTRAVENOUS | Status: DC | PRN
  Administered 2019-05-07: 15 mg via INTRAVENOUS

## 2019-05-07 MED ORDER — SUCCINYLCHOLINE CHLORIDE 200 MG/10ML IV SOSY
PREFILLED_SYRINGE | INTRAVENOUS | Status: DC | PRN
  Administered 2019-05-07: 150 mg via INTRAVENOUS

## 2019-05-07 MED ORDER — GLYCOPYRROLATE 0.2 MG/ML IJ SOLN
INTRAMUSCULAR | Status: AC
  Administered 2019-05-07: 09:00:00 0.3 mg via INTRAVENOUS
  Filled 2019-05-07: qty 2

## 2019-05-07 NOTE — H&P (Signed)
[name] is an 48 y.o. male.   Chief Complaint: slight improvement HPI: recurrent depression  Past Medical History:  Diagnosis Date  . Acute kidney failure ([place]) 01/2012  . Anxiety   . Bipolar disorder ([place])    hypomania  . COPD (chronic obstructive pulmonary disease) ([place])   . Depression   . Low back pain     Past Surgical History:  Procedure Laterality Date  . NO PAST SURGERIES      Family History  Problem Relation Age of Onset  . Anxiety disorder Mother   . Alcohol abuse Father   . Drug abuse Father   . Bipolar disorder Sister   . Alcohol abuse Brother   . Drug abuse Brother   . Bipolar disorder Paternal Aunt   . Schizophrenia Paternal Aunt   . Cancer Maternal Grandmother        lung  . Hypertension Maternal Grandfather   . Cancer Paternal Grandmother        lung  . Hypertension Paternal Grandfather   . Diabetes Neg Hx   . Heart disease Neg Hx   . Hyperlipidemia Neg Hx   . Stroke Neg Hx   . Suicidality Neg Hx    Social History:  reports that he has been smoking cigarettes. He has a 20.00 pack-year smoking history. He has never used smokeless tobacco. He reports that he does not drink alcohol or use drugs.  Allergies:  Allergies  Allergen Reactions  . Ciprofloxacin     Acute kidney failure    (Not in a hospital admission)   No results found for this or any previous visit (from the past 48 hour(s)). No results found.  Review of Systems  Constitutional: Negative.   HENT: Negative.   Eyes: Negative.   Respiratory: Negative.   Cardiovascular: Negative.   Gastrointestinal: Negative.   Musculoskeletal: Negative.   Skin: Negative.   Neurological: Negative.   Psychiatric/Behavioral: Positive for depression. Negative for hallucinations, substance abuse and suicidal ideas.    Blood pressure 122/76, pulse 84, temperature 97.8 F (36.6 C), temperature source Oral, resp. rate 16, height 6\' 5"  (1.956 m), weight 96.6 kg, SpO2 98 %. Physical Exam   Nursing note and vitals reviewed. Constitutional: He appears well-developed and well-nourished.  HENT:  Head: Normocephalic and atraumatic.  Eyes: Pupils are equal, round, and reactive to light. Conjunctivae are normal.  Neck: Normal range of motion.  Cardiovascular: Regular rhythm and normal heart sounds.  Respiratory: Effort normal.  GI: Soft.  Musculoskeletal: Normal range of motion.  Neurological: He is alert.  Skin: Skin is warm and dry.  Psychiatric: Judgment normal. His affect is blunt. His speech is delayed. He is slowed. Thought content is not paranoid. Cognition and memory are normal. He expresses no homicidal and no suicidal ideation.     Assessment/Plan Continue index into next week  Alethia Berthold, MD 05/07/2019, 10:01 AM

## 2019-05-07 NOTE — Anesthesia Postprocedure Evaluation (Signed)
Anesthesia Post Note  Patient: Matthew [name]  Procedure(s) Performed: ECT TX  Patient location during evaluation: PACU Anesthesia Type: General Level of consciousness: awake and alert Pain management: pain level controlled Vital Signs Assessment: post-procedure vital signs reviewed and stable Respiratory status: spontaneous breathing, nonlabored ventilation and respiratory function stable Cardiovascular status: blood pressure returned to baseline and stable Postop Assessment: no apparent nausea or vomiting Anesthetic complications: no     Last Vitals:  Vitals:   05/07/19 1120 05/07/19 1130  BP: 124/87 111/67  Pulse: 67 69  Resp: 13 17  Temp:  36.6 C  SpO2: 100% 99%    Last Pain:  Vitals:   05/07/19 1130  TempSrc:   PainSc: 0-No pain                 Durenda Hurt

## 2019-05-07 NOTE — Anesthesia Preprocedure Evaluation (Signed)
Anesthesia Evaluation  Patient identified by MRN, date of birth, ID band Patient awake    Reviewed: Allergy & Precautions, H&P , NPO status , Patient's Chart, lab work & pertinent test results  Airway Mallampati: III  TM Distance: >3 FB Neck ROM: full    Dental  (+) Chipped   Pulmonary COPD, Current Smoker,           Cardiovascular negative cardio ROS       Neuro/Psych PSYCHIATRIC DISORDERS Anxiety Depression Bipolar Disorder negative neurological ROS     GI/Hepatic negative GI ROS, Neg liver ROS,   Endo/Other  negative endocrine ROS  Renal/GU negative Renal ROS  negative genitourinary   Musculoskeletal   Abdominal   Peds  Hematology negative hematology ROS (+)   Anesthesia Other Findings Past Medical History: 01/2012: Acute kidney failure ([place]) No date: Anxiety No date: Bipolar disorder ([place])     Comment:  hypomania No date: COPD (chronic obstructive pulmonary disease) ([facility]) No date: Depression No date: Low back pain  Past Surgical History: No date: NO PAST SURGERIES     Reproductive/Obstetrics negative OB ROS                             Anesthesia Physical  Anesthesia Plan  ASA: II  Anesthesia Plan: General   Post-op Pain Management:    Induction:   PONV Risk Score and Plan:   Airway Management Planned: Mask  Additional Equipment:   Intra-op Plan:   Post-operative Plan:   Informed Consent: I have reviewed the patients History and Physical, chart, labs and discussed the procedure including the risks, benefits and alternatives for the proposed anesthesia with the patient or authorized representative who has indicated his/her understanding and acceptance.     Dental Advisory Given  Plan Discussed with: Anesthesiologist and CRNA  Anesthesia Plan Comments:         Anesthesia Quick Evaluation

## 2019-05-07 NOTE — Transfer of Care (Signed)
Immediate Anesthesia Transfer of Care Note  Patient: Matthew [name]  Procedure(s) Performed: ECT TX  Patient Location: PACU  Anesthesia Type:General  Level of Consciousness: sedated  Airway & Oxygen Therapy: Patient Spontanous Breathing and Patient connected to face mask oxygen  Post-op Assessment: Report given to RN and Post -op Vital signs reviewed and stable  Post vital signs: Reviewed and stable  Last Vitals:  Vitals Value Taken Time  BP 114/82 05/07/19 1110  Temp 36.6 C 05/07/19 1110  Pulse 76 05/07/19 1113  Resp 13 05/07/19 1113  SpO2 100 % 05/07/19 1113  Vitals shown include unvalidated device data.  Last Pain:  Vitals:   05/07/19 1110  TempSrc:   PainSc: 0-No pain         Complications: No apparent anesthesia complications

## 2019-05-07 NOTE — Discharge Instructions (Signed)
1)  The drugs that you have been given will stay in your system until tomorrow so for the       next 24 hours you should not:  A. Drive an automobile  B. Make any legal decisions  C. Drink any alcoholic beverages  2)  You may resume your regular meals upon return home.  3)  A responsible adult must take you home.  Someone should stay with you for a few          hours, then be available by phone for the remainder of the treatment day.  4)  You May experience any of the following symptoms:  Headache, Nausea and a dry mouth (due to the medications you were given),  temporary memory loss and some confusion, or sore muscles (a warm bath  should help this).  If you you experience any of these symptoms let us know on                your return visit.  5)  Report any of the following: any acute discomfort, severe headache, or temperature        greater than 100.5 F.   Also report any unusual redness, swelling, drainage, or pain         at your IV site.    You may report Symptoms to:  [place] [facility]          Phone: [phone], ECT Department           or Dr. Prescott Gum office [phone]  6)  Your next ECT Treatment is Monday September 21 at 8:00  We will call 2 days prior to your scheduled appointment for arrival times.  7)  Nothing to eat or drink after midnight the night before your procedure.  8)  Take    With a sip of water the morning of your procedure.  9)  Other Instructions: Call [phone] to cancel the morning of your procedure due         to illness or emergency.  10) We will call within 72 hours to assess how you are feeling.

## 2019-05-07 NOTE — Anesthesia Post-op Follow-up Note (Signed)
Anesthesia [facility] form completed.        

## 2019-05-07 NOTE — Anesthesia Procedure Notes (Signed)
Date/Time: 05/07/2019 11:00 AM Performed by: Dionne Bucy, CRNA Pre-anesthesia Checklist: Patient identified, Emergency Drugs available, Suction available and Patient being monitored Patient Re-evaluated:Patient Re-evaluated prior to induction Oxygen Delivery Method: Circle system utilized Preoxygenation: Pre-oxygenation with 100% oxygen Induction Type: IV induction Ventilation: Mask ventilation without difficulty and Mask ventilation throughout procedure Airway Equipment and Method: Bite block Placement Confirmation: positive ETCO2 Dental Injury: Teeth and Oropharynx as per pre-operative assessment

## 2019-05-10 ENCOUNTER — Other Ambulatory Visit: Payer: Self-pay

## 2019-05-10 ENCOUNTER — Encounter: Payer: Self-pay | Admitting: Anesthesiology

## 2019-05-10 ENCOUNTER — Other Ambulatory Visit
Admission: RE | Admit: 2019-05-10 | Discharge: 2019-05-10 | Disposition: A | Discharge status: Home / Self Care | Payer: 59 | Source: Ambulatory Visit | Attending: Psychiatry | Admitting: Psychiatry

## 2019-05-10 ENCOUNTER — Encounter (HOSPITAL_BASED_OUTPATIENT_CLINIC_OR_DEPARTMENT_OTHER)
Admission: RE | Admit: 2019-05-10 | Discharge: 2019-05-10 | Disposition: A | Discharge status: Home / Self Care | Payer: 59 | Source: Ambulatory Visit | Attending: Psychiatry | Admitting: Psychiatry

## 2019-05-10 DIAGNOSIS — F329 Major depressive disorder, single episode, unspecified: Secondary | ICD-10-CM | POA: Diagnosis not present

## 2019-05-10 DIAGNOSIS — M545 Low back pain: Secondary | ICD-10-CM | POA: Diagnosis not present

## 2019-05-10 DIAGNOSIS — F332 Major depressive disorder, recurrent severe without psychotic features: Secondary | ICD-10-CM

## 2019-05-10 DIAGNOSIS — J45909 Unspecified asthma, uncomplicated: Secondary | ICD-10-CM | POA: Diagnosis not present

## 2019-05-10 DIAGNOSIS — F419 Anxiety disorder, unspecified: Secondary | ICD-10-CM | POA: Diagnosis not present

## 2019-05-10 DIAGNOSIS — Z20828 Contact with and (suspected) exposure to other viral communicable diseases: Secondary | ICD-10-CM | POA: Insufficient documentation

## 2019-05-10 DIAGNOSIS — Z01812 Encounter for preprocedural laboratory examination: Secondary | ICD-10-CM | POA: Insufficient documentation

## 2019-05-10 LAB — SARS CORONAVIRUS 2 BY RT PCR (HOSPITAL ORDER, PERFORMED IN ~~LOC~~ HOSPITAL LAB): SARS Coronavirus 2: NEGATIVE

## 2019-05-10 MED ORDER — METHOHEXITAL SODIUM 100 MG/10ML IV SOSY
PREFILLED_SYRINGE | INTRAVENOUS | Status: DC | PRN
  Administered 2019-05-10: 120 mg via INTRAVENOUS

## 2019-05-10 MED ORDER — MIDAZOLAM HCL 2 MG/2ML IJ SOLN: 2.0000 mg | Freq: Once | INTRAMUSCULAR | Status: DC

## 2019-05-10 MED ORDER — MIDAZOLAM HCL 2 MG/2ML IJ SOLN
INTRAMUSCULAR | Status: DC | PRN
  Administered 2019-05-10: 2 mg via INTRAVENOUS

## 2019-05-10 MED ORDER — GLYCOPYRROLATE 0.2 MG/ML IJ SOLN
0.3000 mg | Freq: Once | INTRAMUSCULAR | Status: AC
  Administered 2019-05-10: 0.3 mg via INTRAVENOUS

## 2019-05-10 MED ORDER — SODIUM CHLORIDE 0.9 % IV SOLN
500.0000 mL | Freq: Once | INTRAVENOUS | Status: AC
  Administered 2019-05-10: 10:00:00 via INTRAVENOUS

## 2019-05-10 MED ORDER — SUCCINYLCHOLINE CHLORIDE 200 MG/10ML IV SOSY
PREFILLED_SYRINGE | INTRAVENOUS | Status: DC | PRN
  Administered 2019-05-10: 150 mg via INTRAVENOUS

## 2019-05-10 MED ORDER — LABETALOL HCL 5 MG/ML IV SOLN
INTRAVENOUS | Status: AC
  Filled 2019-05-10: qty 4

## 2019-05-10 MED ORDER — METHOHEXITAL SODIUM 0.5 G IJ SOLR
INTRAMUSCULAR | Status: AC
  Filled 2019-05-10: qty 500

## 2019-05-10 MED ORDER — KETOROLAC TROMETHAMINE 30 MG/ML IJ SOLN: 30.0000 mg | Freq: Once | INTRAMUSCULAR | Status: DC

## 2019-05-10 MED ORDER — MIDAZOLAM HCL 2 MG/2ML IJ SOLN
INTRAMUSCULAR | Status: AC
  Filled 2019-05-10: qty 2

## 2019-05-10 MED ORDER — LABETALOL HCL 5 MG/ML IV SOLN
INTRAVENOUS | Status: DC | PRN
  Administered 2019-05-10: 15 mg via INTRAVENOUS

## 2019-05-10 MED ORDER — GLYCOPYRROLATE 0.2 MG/ML IJ SOLN
INTRAMUSCULAR | Status: AC
  Filled 2019-05-10: qty 2

## 2019-05-10 MED ORDER — KETOROLAC TROMETHAMINE 30 MG/ML IJ SOLN
30.0000 mg | Freq: Once | INTRAMUSCULAR | Status: AC
  Administered 2019-05-10: 30 mg via INTRAVENOUS

## 2019-05-10 MED ORDER — GLYCOPYRROLATE 0.2 MG/ML IJ SOLN: 0.3000 mg | Freq: Once | INTRAMUSCULAR | Status: DC

## 2019-05-10 MED ORDER — SUCCINYLCHOLINE CHLORIDE 20 MG/ML IJ SOLN
INTRAMUSCULAR | Status: AC
  Filled 2019-05-10: qty 1

## 2019-05-10 MED ORDER — KETOROLAC TROMETHAMINE 30 MG/ML IJ SOLN
INTRAMUSCULAR | Status: AC
  Filled 2019-05-10: qty 1

## 2019-05-10 NOTE — Anesthesia Procedure Notes (Signed)
Date/Time: 05/10/2019 10:22 AM Performed by: Dionne Bucy, CRNA Pre-anesthesia Checklist: Patient identified, Emergency Drugs available, Suction available and Patient being monitored Patient Re-evaluated:Patient Re-evaluated prior to induction Oxygen Delivery Method: Circle system utilized Preoxygenation: Pre-oxygenation with 100% oxygen Induction Type: IV induction Ventilation: Mask ventilation without difficulty and Mask ventilation throughout procedure Airway Equipment and Method: Bite block Placement Confirmation: positive ETCO2 Dental Injury: Teeth and Oropharynx as per pre-operative assessment

## 2019-05-10 NOTE — H&P (Signed)
Matthew [name] is an 48 y.o. male.   Chief Complaint: improved mood HPI: recurrent depression  Past Medical History:  Diagnosis Date  . Acute kidney failure ([place]) 01/2012  . Anxiety   . Bipolar disorder ([place])    hypomania  . COPD (chronic obstructive pulmonary disease) ([place])   . Depression   . Low back pain     Past Surgical History:  Procedure Laterality Date  . NO PAST SURGERIES      Family History  Problem Relation Age of Onset  . Anxiety disorder Mother   . Alcohol abuse Father   . Drug abuse Father   . Bipolar disorder Sister   . Alcohol abuse Brother   . Drug abuse Brother   . Bipolar disorder Paternal Aunt   . Schizophrenia Paternal Aunt   . Cancer Maternal Grandmother        lung  . Hypertension Maternal Grandfather   . Cancer Paternal Grandmother        lung  . Hypertension Paternal Grandfather   . Diabetes Neg Hx   . Heart disease Neg Hx   . Hyperlipidemia Neg Hx   . Stroke Neg Hx   . Suicidality Neg Hx    Social History:  reports that he has been smoking cigarettes. He has a 20.00 pack-year smoking history. He has never used smokeless tobacco. He reports that he does not drink alcohol or use drugs.  Allergies:  Allergies  Allergen Reactions  . Ciprofloxacin     Acute kidney failure    (Not in a hospital admission)   Results for orders placed or performed during the hospital encounter of 05/10/19 (from the past 48 hour(s))  SARS Coronavirus 2 [facility] order, Performed in [facility] hospital lab) Nasopharyngeal Nasopharyngeal Swab     Status: None   Collection Time: 05/10/19  8:28 AM   Specimen: Nasopharyngeal Swab  Result Value Ref Range   SARS Coronavirus 2 NEGATIVE NEGATIVE    Comment: (NOTE) If result is NEGATIVE SARS-CoV-2 target nucleic acids are NOT DETECTED. The SARS-CoV-2 RNA is generally detectable in upper and lower  respiratory specimens during the acute phase of infection. The lowest  concentration of SARS-CoV-2 viral  copies this assay can detect is 250  copies / mL. A negative result does not preclude SARS-CoV-2 infection  and should not be used as the sole basis for treatment or other  patient management decisions.  A negative result may occur with  improper specimen collection / handling, submission of specimen other  than nasopharyngeal swab, presence of viral mutation(s) within the  areas targeted by this assay, and inadequate number of viral copies  (<250 copies / mL). A negative result must be combined with clinical  observations, patient history, and epidemiological information. If result is POSITIVE SARS-CoV-2 target nucleic acids are DETECTED. The SARS-CoV-2 RNA is generally detectable in upper and lower  respiratory specimens dur ing the acute phase of infection.  Positive  results are indicative of active infection with SARS-CoV-2.  Clinical  correlation with patient history and other diagnostic information is  necessary to determine patient infection status.  Positive results do  not rule out bacterial infection or co-infection with other viruses. If result is PRESUMPTIVE POSTIVE SARS-CoV-2 nucleic acids MAY BE PRESENT.   A presumptive positive result was obtained on the submitted specimen  and confirmed on repeat testing.  While 2019 novel coronavirus  (SARS-CoV-2) nucleic acids may be present in the submitted sample  additional confirmatory testing may  be necessary for epidemiological  and / or clinical management purposes  to differentiate between  SARS-CoV-2 and other Sarbecovirus currently known to infect humans.  If clinically indicated additional testing with an alternate test  methodology [phone]) is advised. The SARS-CoV-2 RNA is generally  detectable in upper and lower respiratory sp ecimens during the acute  phase of infection. The expected result is Negative. Fact Sheet for Patients:  BoilerBrush.com.cy Fact Sheet for Healthcare  Providers: https://pope.com/ This test is not yet approved or cleared by the [place] FDA and has been authorized for detection and/or diagnosis of SARS-CoV-2 by FDA under an Emergency Use Authorization (EUA).  This EUA will remain in effect (meaning this test can be used) for the duration of the COVID-19 declaration under Section 564(b)(1) of the Act, 21 U.S.C. section 360bbb-3(b)(1), unless the authorization is terminated or revoked sooner. Performed at [facility], [address] [place], [place] [address]    No results found.  Review of Systems  Constitutional: Negative.   HENT: Negative.   Eyes: Negative.   Respiratory: Negative.   Cardiovascular: Negative.   Gastrointestinal: Negative.   Musculoskeletal: Negative.   Skin: Negative.   Neurological: Negative.   Psychiatric/Behavioral: Negative.     Blood pressure 136/83, pulse 95, resp. rate 16, height 6\' 5"  (1.956 m), weight 96.2 kg, SpO2 100 %. Physical Exam  Nursing note and vitals reviewed. Constitutional: He appears well-developed and well-nourished.  HENT:  Head: Normocephalic and atraumatic.  Eyes: Pupils are equal, round, and reactive to light. Conjunctivae are normal.  Neck: Normal range of motion.  Cardiovascular: Regular rhythm and normal heart sounds.  Respiratory: Effort normal.  GI: Soft.  Musculoskeletal: Normal range of motion.  Neurological: He is alert.  Skin: Skin is warm and dry.  Psychiatric: He has a normal mood and affect. His behavior is normal. Judgment and thought content normal.     Assessment/Plan Cont index ect  , MD 05/10/2019, 10:06 AM

## 2019-05-10 NOTE — Anesthesia Post-op Follow-up Note (Signed)
Anesthesia [facility] form completed.        

## 2019-05-10 NOTE — Transfer of Care (Signed)
Immediate Anesthesia Transfer of Care Note  Patient: Matthew [name]  Procedure(s) Performed: ECT TX  Patient Location: PACU  Anesthesia Type:General  Level of Consciousness: sedated  Airway & Oxygen Therapy: Patient Spontanous Breathing and Patient connected to face mask oxygen  Post-op Assessment: Report given to RN and Post -op Vital signs reviewed and stable  Post vital signs: Reviewed and stable  Last Vitals:  Vitals Value Taken Time  BP 130/95 05/10/19 1033  Temp    Pulse 89 05/10/19 1033  Resp 12 05/10/19 1033  SpO2 100 % 05/10/19 1033  Vitals shown include unvalidated device data.  Last Pain:  Vitals:   05/10/19 0900  TempSrc:   PainSc: 0-No pain         Complications: No apparent anesthesia complications

## 2019-05-10 NOTE — Procedures (Signed)
ECT SERVICES Physician's Interval Evaluation & Treatment Note  Patient Identification: Matthew [name] MRN:  [id number] Date of Evaluation:  05/10/2019 TX #: [id number]  MADRS:   MMSE:   P.E. Findings:  No change to physical exam  Psychiatric Interval Note:  Mood appears calmer and he is subjectively feeling better  Subjective:  Patient is a 48 y.o. male seen for evaluation for Electroconvulsive Therapy. Feeling better no complaints of memory problems  Treatment Summary:   [x]   Right Unilateral             []  Bilateral   % Energy : 0.3 ms 50%   Impedance: 820 ohms  Seizure Energy Index: 3773 V squared  Postictal Suppression Index: 63%  Seizure Concordance Index: 87%  Medications  Pre Shock: Robinul 0.3 mg Toradol 30 mg Brevital 120 mg succinylcholine 150 mg  Post Shock: Versed 2 mg  Seizure Duration: 28 seconds EMG 58 seconds EEG   Comments: Follow-up Wednesday  Lungs:  [x]   Clear to auscultation               []  Other:   Heart:    [x]   Regular rhythm             []  irregular rhythm    [x]   Previous H&P reviewed, patient examined and there are NO CHANGES                 []   Previous H&P reviewed, patient examined and there are changes noted.   Alethia Berthold, MD 9/21/202010:27 AM

## 2019-05-10 NOTE — Anesthesia Postprocedure Evaluation (Signed)
Anesthesia Post Note  Patient: Matthew [name]  Procedure(s) Performed: ECT TX  Patient location during evaluation: PACU Anesthesia Type: General Level of consciousness: awake and alert Pain management: pain level controlled Vital Signs Assessment: post-procedure vital signs reviewed and stable Respiratory status: spontaneous breathing, nonlabored ventilation and respiratory function stable Cardiovascular status: blood pressure returned to baseline and stable Postop Assessment: no signs of nausea or vomiting Anesthetic complications: no     Last Vitals:  Vitals:   05/10/19 1053 05/10/19 1109  BP: (!) 126/91 127/87  Pulse: 85 79  Resp: 13 18  Temp:  36.9 C  SpO2: 99%     Last Pain:  Vitals:   05/10/19 1109  TempSrc: Oral  PainSc: 0-No pain                 Wilena Tyndall

## 2019-05-10 NOTE — Discharge Instructions (Signed)
1)  The drugs that you have been given will stay in your system until tomorrow so for the       next 24 hours you should not:  A. Drive an automobile  B. Make any legal decisions  C. Drink any alcoholic beverages  2)  You may resume your regular meals upon return home.  3)  A responsible adult must take you home.  Someone should stay with you for a few          hours, then be available by phone for the remainder of the treatment day.  4)  You May experience any of the following symptoms:  Headache, Nausea and a dry mouth (due to the medications you were given),  temporary memory loss and some confusion, or sore muscles (a warm bath  should help this).  If you you experience any of these symptoms let us know on                your return visit.  5)  Report any of the following: any acute discomfort, severe headache, or temperature        greater than 100.5 F.   Also report any unusual redness, swelling, drainage, or pain         at your IV site.    You may report Symptoms to:  [place] [facility]          Phone: [phone], ECT Department           or Dr. Prescott Gum office [phone]  6)  Your next ECT Treatment is Wednesday September 23, NO COVID TEST NEEDED.  We will call 2 days prior to your scheduled appointment for arrival times.  7)  Nothing to eat or drink after midnight the night before your procedure.  8)  Take      With a sip of water the morning of your procedure.  9)  Other Instructions: Call [phone] to cancel the morning of your procedure due         to illness or emergency.  10) We will call within 72 hours to assess how you are feeling.

## 2019-05-10 NOTE — Anesthesia Preprocedure Evaluation (Signed)
Anesthesia Evaluation  Patient identified by MRN, date of birth, ID band Patient awake    Reviewed: Allergy & Precautions, NPO status , Patient's Chart, lab work & pertinent test results  History of Anesthesia Complications Negative for: history of anesthetic complications  Airway Mallampati: III  TM Distance: >3 FB Neck ROM: Full    Dental no notable dental hx.    Pulmonary asthma , neg sleep apnea, COPD,  COPD inhaler, Current Smoker and Patient abstained from smoking.,    breath sounds clear to auscultation- rhonchi (-) wheezing      Cardiovascular Exercise Tolerance: Good (-) hypertension(-) CAD, (-) Past MI, (-) Cardiac Stents and (-) CABG  Rhythm:Regular Rate:Normal - Systolic murmurs and - Diastolic murmurs    Neuro/Psych neg Seizures PSYCHIATRIC DISORDERS Anxiety Depression Bipolar Disorder negative neurological ROS     GI/Hepatic negative GI ROS, Neg liver ROS,   Endo/Other  negative endocrine ROSneg diabetes  Renal/GU negative Renal ROS     Musculoskeletal negative musculoskeletal ROS (+)   Abdominal (+) - obese,   Peds  Hematology negative hematology ROS (+)   Anesthesia Other Findings Past Medical History: 01/2012: Acute kidney failure (HCC) No date: Anxiety No date: Bipolar disorder (HCC)     Comment:  hypomania No date: COPD (chronic obstructive pulmonary disease) (HCC) No date: Depression No date: Low back pain   Reproductive/Obstetrics                             Anesthesia Physical  Anesthesia Plan  ASA: II  Anesthesia Plan: General   Post-op Pain Management:    Induction: Intravenous  PONV Risk Score and Plan: 0 and Ondansetron  Airway Management Planned: Mask  Additional Equipment:   Intra-op Plan:   Post-operative Plan:   Informed Consent: I have reviewed the patients History and Physical, chart, labs and discussed the procedure including the risks,  benefits and alternatives for the proposed anesthesia with the patient or authorized representative who has indicated his/her understanding and acceptance.     Dental advisory given  Plan Discussed with: CRNA and Anesthesiologist  Anesthesia Plan Comments:         Anesthesia Quick Evaluation  

## 2019-05-11 ENCOUNTER — Other Ambulatory Visit: Payer: Self-pay | Admitting: Psychiatry

## 2019-05-12 ENCOUNTER — Telehealth: Payer: Self-pay

## 2019-05-14 ENCOUNTER — Encounter: Payer: Self-pay | Admitting: Family Medicine

## 2019-05-14 ENCOUNTER — Ambulatory Visit: Payer: 59 | Admitting: Family Medicine

## 2019-05-14 ENCOUNTER — Other Ambulatory Visit: Payer: Self-pay

## 2019-05-14 VITALS — BP 126/88 | HR 116 | Temp 97.9°F | Resp 17 | Ht 78.0 in | Wt 209.5 lb

## 2019-05-14 DIAGNOSIS — R630 Anorexia: Secondary | ICD-10-CM

## 2019-05-14 LAB — BASIC METABOLIC PANEL
BUN: 14 mg/dL (ref 6–23)
CO2: 29 mEq/L (ref 19–32)
Calcium: 10.4 mg/dL (ref 8.4–10.5)
Chloride: 102 mEq/L (ref 96–112)
Creatinine, Ser: 0.82 mg/dL (ref 0.40–1.50)
GFR: 100.13 mL/min (ref >60.00)
Glucose, Bld: 79 mg/dL (ref 70–99)
Potassium: 4.7 mEq/L (ref 3.5–5.1)
Sodium: 140 mEq/L (ref 135–145)

## 2019-05-14 LAB — CBC WITH DIFFERENTIAL/PLATELET
Basophils Absolute: 0 10*3/uL (ref 0.0–0.1)
Basophils Relative: 0.5 % (ref 0.0–3.0)
Eosinophils Absolute: 0.2 10*3/uL (ref 0.0–0.7)
Eosinophils Relative: 2.5 % (ref 0.0–5.0)
HCT: 48.2 % (ref 39.0–52.0)
Hemoglobin: 16.5 g/dL (ref 13.0–17.0)
Lymphocytes Relative: 31 % (ref 12.0–46.0)
Lymphs Abs: 2.7 10*3/uL (ref 0.7–4.0)
MCHC: 34.3 g/dL (ref 30.0–36.0)
MCV: 91.3 fl (ref 78.0–100.0)
Monocytes Absolute: 0.7 10*3/uL (ref 0.1–1.0)
Monocytes Relative: 7.4 % (ref 3.0–12.0)
Neutro Abs: 5.2 10*3/uL (ref 1.4–7.7)
Neutrophils Relative %: 58.6 % (ref 43.0–77.0)
Platelets: 250 10*3/uL (ref 150.0–400.0)
RBC: 5.28 Mil/uL (ref 4.22–5.81)
RDW: 13.2 % (ref 11.5–15.5)
WBC: 8.8 10*3/uL (ref 4.0–10.5)

## 2019-05-14 LAB — HEPATIC FUNCTION PANEL
ALT: 25 U/L (ref 0–53)
AST: 13 U/L (ref 0–37)
Albumin: 4.8 g/dL (ref 3.5–5.2)
Alkaline Phosphatase: 78 U/L (ref 39–117)
Bilirubin, Direct: 0.1 mg/dL (ref 0.0–0.3)
Total Bilirubin: 0.6 mg/dL (ref 0.2–1.2)
Total Protein: 7.2 g/dL (ref 6.0–8.3)

## 2019-05-14 LAB — TSH: TSH: 1.03 u[IU]/mL (ref 0.35–4.50)

## 2019-05-14 NOTE — Progress Notes (Signed)
   Subjective:    Patient ID: Matthew [name], male    DOB: [birth year], 48 y.o.   MRN: [id number]  HPI Loss of appetite- pt is down 13 lbs since July visit.  Noted loss of appetite in early August.  Denies any medication changes.  Eating 'maybe 2' meals/day.  Eating breakfast 'most of the time'.  Will skip lunch at times.  'i'm just not hungry'.  Denies feeling poorly after eating.  Denies N/V, bloating.  Some days appetite is better than others.  Doesn't think ECT contributed.  Eating dinner w/ family.  Wife has noticed loss of appetite.  No fevers or chills.  No night sweats.  No chronic cough.  High levels of work stress.   Review of Systems For ROS see HPI     Objective:   Physical Exam Vitals signs reviewed.  Constitutional:      General: He is not in acute distress.    Appearance: He is well-developed.  HENT:     Head: Normocephalic and atraumatic.  Eyes:     Conjunctiva/sclera: Conjunctivae normal.     Pupils: Pupils are equal, round, and reactive to light.  Neck:     Musculoskeletal: Normal range of motion and neck supple.     Thyroid: No thyromegaly.  Cardiovascular:     Rate and Rhythm: Normal rate and regular rhythm.     Heart sounds: Normal heart sounds. No murmur.  Pulmonary:     Effort: Pulmonary effort is normal. No respiratory distress.     Breath sounds: Normal breath sounds.  Abdominal:     General: Bowel sounds are normal. There is no distension.     Palpations: Abdomen is soft.  Lymphadenopathy:     Cervical: No cervical adenopathy.  Skin:    General: Skin is warm and dry.  Neurological:     Mental Status: He is alert and oriented to person, place, and time.     Cranial Nerves: No cranial nerve deficit.  Psychiatric:        Behavior: Behavior normal.           Assessment & Plan:  Loss of appetite- new.  Pt is down 13 lbs since July visit.  States he doesn't feel poorly when he eats, he just doesn't want to.  Denies any medication changes.   Doesn't feel this is related to ECT.  Suspect this is due to high levels of stress at work.  He admits that he gets busy at work and will skip lunch during the day.  Will first try and have him schedule meals.  If this doesn't work, may consider medication to stimulate appetite.  Will also check labs to r/o physiologic causes.  Reviewed supportive care and red flags that should prompt return.  Pt expressed understanding and is in agreement w/ plan.

## 2019-05-14 NOTE — Patient Instructions (Signed)
Follow up in 4-6 weeks to recheck appetite and weight We'll notify you of your lab results and make any changes if needed Try and make sure you are getting at least 3 meals/day I recommend grazing throughout the day to make sure you are eating It's ok to say 'no' and take time for yourself- eating, sleeping, peeing are all more important than meetings Call with any questions or concerns Hang in there!!!

## 2019-05-15 LAB — ANA: Anti Nuclear Antibody (ANA): NEGATIVE

## 2019-05-20 ENCOUNTER — Ambulatory Visit (INDEPENDENT_AMBULATORY_CARE_PROVIDER_SITE_OTHER): Payer: 59 | Admitting: Psychiatry

## 2019-05-20 ENCOUNTER — Encounter (HOSPITAL_COMMUNITY): Payer: Self-pay | Admitting: Psychiatry

## 2019-05-20 ENCOUNTER — Other Ambulatory Visit: Payer: Self-pay

## 2019-05-20 DIAGNOSIS — F410 Panic disorder [episodic paroxysmal anxiety] without agoraphobia: Secondary | ICD-10-CM | POA: Diagnosis not present

## 2019-05-20 DIAGNOSIS — F411 Generalized anxiety disorder: Secondary | ICD-10-CM

## 2019-05-20 DIAGNOSIS — F3181 Bipolar II disorder: Secondary | ICD-10-CM

## 2019-05-20 DIAGNOSIS — F99 Mental disorder, not otherwise specified: Secondary | ICD-10-CM

## 2019-05-20 DIAGNOSIS — R41840 Attention and concentration deficit: Secondary | ICD-10-CM | POA: Diagnosis not present

## 2019-05-20 DIAGNOSIS — F5105 Insomnia due to other mental disorder: Secondary | ICD-10-CM | POA: Diagnosis not present

## 2019-05-20 MED ORDER — LORAZEPAM 1 MG PO TABS: 1.0000 mg | ORAL_TABLET | Freq: Three times a day (TID) | ORAL | 2 refills | Status: DC | PRN

## 2019-05-20 MED ORDER — VENLAFAXINE HCL ER 75 MG PO CP24: 225.0000 mg | ORAL_CAPSULE | Freq: Every day | ORAL | 0 refills | Status: DC

## 2019-05-20 MED ORDER — DIVALPROEX SODIUM ER 250 MG PO TB24: ORAL_TABLET | ORAL | 0 refills | Status: DC

## 2019-05-20 MED ORDER — ATOMOXETINE HCL 40 MG PO CAPS: 40.0000 mg | ORAL_CAPSULE | Freq: Two times a day (BID) | ORAL | 0 refills | Status: DC

## 2019-05-20 MED ORDER — BUSPIRONE HCL 15 MG PO TABS: 15.0000 mg | ORAL_TABLET | Freq: Every day | ORAL | 0 refills | Status: DC

## 2019-05-20 MED FILL — VENLAFAXINE HCL ER 75 MG CA: 75 | 90 days supply | Qty: 270 | Fill #0

## 2019-05-20 MED FILL — DIVALPROEX SOD ER 250 MG TA: 250 | 90 days supply | Qty: 270 | Fill #0

## 2019-05-20 MED FILL — ATOMOXETINE HCL 40 MG CAPS: 40 | 90 days supply | Qty: 180 | Fill #0

## 2019-05-20 NOTE — Progress Notes (Signed)
Virtual Visit via Video Note  I connected with Matthew [name] on 05/20/19 at  8:00 AM EDT by a video enabled telemedicine application and verified that I am speaking with the correct person using two identifiers.  Location: Patient: home Provider: office   I discussed the limitations of evaluation and management by telemedicine and the availability of in person appointments. The patient expressed understanding and agreed to proceed.  History of Present Illness: Matthew [name] tells me that he is feeling better since having the ECT treatments.  He has no longer feeling depressed.  He feels more motivated and more engaged with his family and at work.  He is sleeping well.  Even though he has a lot going on he feels as though he is handling the stress of everything well.  He no longer has the feeling that he wants to run away or hide.  Matthew [name] tells me that the anxiety is mild and he does not experience any every day the way he was.  He denies any panic attacks.  His focus has improved and he is able to concentrate and complete tasks.  He is denying any manic or hypomanic-like symptoms.  Matthew [name] denies SI/HI.  He stopped ECT treatments because he did not want to become someone who had to do ECT for the rest of his life.  He does not like the feeling of loss of control and does not like the side effects.  In addition looking around at the other individuals who were getting ECT made him feel bad.  Matthew [name] plans to continue to take medication and learn how to deal with his symptoms to the best of his ability.   Observations/Objective:  There were no vitals taken for this visit.There is no height or weight on file to calculate BMI.  General Appearance: Casual and mildly dishelved due to just wakingup  Eye Contact:  Good  Speech:  Clear and Coherent and Normal Rate  Volume:  Normal  Mood:  Euthymic  Affect:  Constricted  Thought Process:  Goal Directed, Linear and Descriptions of Associations: Intact   Orientation:  Full (Time, Place, and Person)  Thought Content:  Logical  Suicidal Thoughts:  No  Homicidal Thoughts:  No  Memory:  Immediate;   Good  Judgement:  Fair  Insight:  Good  Psychomotor Activity:  Normal  Concentration:  Concentration: Good  Recall:  Good  Fund of Knowledge:  Good  Language:  Good  Akathisia:  No  Handed:  Right  AIMS (if indicated):     Assets:  Communication Skills Desire for Improvement Financial Resources/Insurance Intimacy Resilience Social Support Talents/Skills Transportation Vocational/Educational  ADL's:  Intact  Cognition:  WNL  Sleep:        Assessment and Plan: Bipolar 2 disorder; GAD; panic 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; insomnia; rule out ADHD  Status of current symptoms: Significant improvement in bipolar depression and anxiety and panic attacks  Matthew [name] went through a series of ECT treatments and reports that he felt good effect  Continue: #1 Strattera 40 mg p.o. twice daily for mood and concentration issues 2.  BuSpar 15 mg p.o. twice daily for anxiety and augmentation of depression 3.  Depakote 250 mg p.o. every morning and 500 mg p.o. q. lunch for bipolar 2 disorder 4.  Ativan 1 mg p.o. 3 times daily as needed anxiety and panic attacks 5.  Effexor XR 225 mg p.o. daily for mood and anxiety and panic attacks 6.  Cialis 5 mg p.o. as needed  sexual activity   I reviewed labs done on 05/14/2019- CMP and CBC are within normal limits, TSH was normal.  On 03/09/2019 his triglycerides were elevated, Depakote level was 39.1, hemoglobin A1c was 5.4  Follow Up Instructions: In 1 month or sooner if needed   I discussed the assessment and treatment plan with the patient. The patient was provided an opportunity to ask questions and all were answered. The patient agreed with the plan and demonstrated an understanding of the instructions.   The patient was advised to call back or seek an in-person evaluation if the symptoms worsen or if  the condition fails to improve as anticipated.  I provided 30 minutes of non-face-to-face time during this encounter.   Charlcie Cradle, MD

## 2019-06-16 ENCOUNTER — Other Ambulatory Visit: Payer: Self-pay

## 2019-06-16 DIAGNOSIS — Z20822 Contact with and (suspected) exposure to covid-19: Secondary | ICD-10-CM

## 2019-06-16 MED FILL — LORAZEPAM 1 MG TABS: 1 | 30 days supply | Qty: 90 | Fill #1

## 2019-06-16 MED FILL — BACLOFEN 10 MG TABS: 10 | 15 days supply | Qty: 45 | Fill #2

## 2019-06-17 ENCOUNTER — Encounter (HOSPITAL_COMMUNITY): Payer: Self-pay | Admitting: Psychiatry

## 2019-06-17 ENCOUNTER — Ambulatory Visit (INDEPENDENT_AMBULATORY_CARE_PROVIDER_SITE_OTHER): Payer: 59 | Admitting: Psychiatry

## 2019-06-17 ENCOUNTER — Other Ambulatory Visit: Payer: Self-pay

## 2019-06-17 DIAGNOSIS — F410 Panic disorder [episodic paroxysmal anxiety] without agoraphobia: Secondary | ICD-10-CM

## 2019-06-17 DIAGNOSIS — F411 Generalized anxiety disorder: Secondary | ICD-10-CM

## 2019-06-17 DIAGNOSIS — R37 Sexual dysfunction, unspecified: Secondary | ICD-10-CM | POA: Diagnosis not present

## 2019-06-17 DIAGNOSIS — F3181 Bipolar II disorder: Secondary | ICD-10-CM | POA: Diagnosis not present

## 2019-06-17 LAB — NOVEL CORONAVIRUS, NAA: SARS-CoV-2, NAA: NOT DETECTED

## 2019-06-17 MED ORDER — TADALAFIL 5 MG PO TABS: ORAL_TABLET | ORAL | 1 refills | Status: DC

## 2019-06-17 MED FILL — TADALAFIL 5 MG TABS: 5 | 10 days supply | Qty: 10 | Fill #0

## 2019-06-17 NOTE — Progress Notes (Signed)
Virtual Visit via Video Note  I connected with Matthew [name] on 06/17/19 at  8:00 AM EDT by a video enabled telemedicine application and verified that I am speaking with the correct person using two identifiers.  Location: Patient: home Provider: office   I discussed the limitations of evaluation and management by telemedicine and the availability of in person appointments. The patient expressed understanding and agreed to proceed.  History of Present Illness: [name] shares that he is feeling more like himself.  His wife has also noticed and tells him that she is happy to see him acting like his usual self and being more present with the family.  He states overall his depression is significantly improved.  He has a random day where he feels a little bit down but it is nowhere like it was before.  He no longer has the desire to run away.  He denies SI/HI.  Sleep is good.  While work is overwhelming it is manageable.  On a few occasions he is experienced some chest pain but denies any full-blown panic attacks.  Anxiety is mostly related to the overwhelming amount of work that he has to get done.  BuSpar and Ativan are helping.  He is denying any manic or hypomanic-like symptoms.  Overall he is doing much better.   Observations/Objective:  There were no vitals taken for this visit.There is no height or weight on file to calculate BMI.  General Appearance: Casual and Neat  Eye Contact:  Good  Speech:  Clear and Coherent and Normal Rate  Volume:  Normal  Mood:  Euthymic  Affect:  Full Range  Thought Process:  Goal Directed, Linear and Descriptions of Associations: Intact  Orientation:  Full (Time, Place, and Person)  Thought Content:  Logical  Suicidal Thoughts:  No  Homicidal Thoughts:  No  Memory:  Immediate;   Good Recent;   Good  Judgement:  Good  Insight:  Good  Psychomotor Activity:  Normal  Concentration:  Concentration: Good and Attention Span: Good  Recall:  Good  Fund  of Knowledge:  Good  Language:  Good  Akathisia:  No  Handed:  Right  AIMS (if indicated):     Assets:  Communication Skills Desire for Improvement Financial Resources/Insurance Housing Intimacy Physical Health Resilience Social Support Talents/Skills Transportation Vocational/Educational  ADL's:  Intact  Cognition:  WNL  Sleep:       I reviewed the information below on June 17, 2019 and have updated it Assessment and Plan: Bipolar type II disorder-significant improvement; GAD; panic 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; insomnia-significant improvement; rule out ADHD  Discussed limit setting and saying "no".  We discussed the need for a good work life balance.  We also discussed ways that other colleagues are potentially managing their workload and things that he could do to also manage his stress.   Continue: #1 Strattera 40 mg p.o. twice daily for mood and concentration issues 2.  BuSpar 15 mg p.o. twice daily for anxiety and augmentation of depression 3.  Depakote 250 mg p.o. every morning and 500 mg p.o. q. lunch for bipolar 2 disorder 4.  Ativan 1 mg p.o. 3 times daily as needed anxiety and panic attacks 5.  Effexor XR 225 mg p.o. daily for mood and anxiety and panic attacks 6.  Cialis 5 mg p.o. as needed sexual activity  He feels that the ECT treatments along with current medications have really benefited him.   Follow Up Instructions: In 4-8 weeks or sooner  if needed   I discussed the assessment and treatment plan with the patient. The patient was provided an opportunity to ask questions and all were answered. The patient agreed with the plan and demonstrated an understanding of the instructions.   The patient was advised to call back or seek an in-person evaluation if the symptoms worsen or if the condition fails to improve as anticipated.  I provided 30 minutes of non-face-to-face time during this encounter.   Charlcie Cradle, MD

## 2019-06-18 ENCOUNTER — Other Ambulatory Visit: Payer: Self-pay

## 2019-06-18 ENCOUNTER — Encounter: Payer: Self-pay | Admitting: Family Medicine

## 2019-06-18 ENCOUNTER — Ambulatory Visit (INDEPENDENT_AMBULATORY_CARE_PROVIDER_SITE_OTHER): Payer: 59 | Admitting: Family Medicine

## 2019-06-18 VITALS — HR 97 | Ht 78.0 in | Wt 208.0 lb

## 2019-06-18 DIAGNOSIS — R630 Anorexia: Secondary | ICD-10-CM | POA: Diagnosis not present

## 2019-06-18 DIAGNOSIS — F3181 Bipolar II disorder: Secondary | ICD-10-CM

## 2019-06-18 NOTE — Assessment & Plan Note (Signed)
Currently stable.  Feels well controlled on current medications.  Stopped ECT but feels better off treatment.  Will continue to follow.

## 2019-06-18 NOTE — Progress Notes (Signed)
Virtual Visit via Video   I connected with patient on 06/18/19 at  9:30 AM EDT by a video enabled telemedicine application and verified that I am speaking with the correct person using two identifiers.  Location patient: Home Location provider: [facility], Office Persons participating in the virtual visit: Patient, Provider, CMA (Jess B)  I discussed the limitations of evaluation and management by telemedicine and the availability of in person appointments. The patient expressed understanding and agreed to proceed.  Subjective:   HPI:  Loss of appetite- pt's weight is stable, he has been trying to eat more but doesn't have much appetite.  Eating breakfast, skipping lunch, eating dinner.  Denies abd pain, N/V, early satiety.  Pt is under considerable stress w/ pandemic.  Pt had 4 recent ECT treatments.  Has nearly finished dissertation.  Got flu shot.  ROS:   See pertinent positives and negatives per HPI.  Patient Active Problem List   Diagnosis Date Noted  . Overweight (BMI 25.0-29.9) 03/09/2019  . GAD (generalized anxiety disorder) 02/16/2019  . Panic 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 07/07/2014  . Bipolar 2 disorder, major depressive episode (HCC) 02/03/2014  . Low back pain 05/28/2012  . Scoliosis of thoracic spine 03/12/2012  . Back pain, thoracic 03/12/2012  . Other abnormal glucose 03/12/2012  . Nonspecific elevation of levels of transaminase or lactic acid dehydrogenase (LDH) 02/02/2012  . Tobacco abuse 01/26/2012  . Asthma 01/26/2012  . Routine general medical examination at a health care facility 01/21/2011    Social History   Tobacco Use  . Smoking status: Current Every Day Smoker    Packs/day: 1.00    Years: 20.00    Pack years: 20.00    Types: Cigarettes  . Smokeless tobacco: Never Used  Substance Use Topics  . Alcohol use: No    Comment: Occasionally on holidays.    Current Outpatient Medications:  .  atomoxetine (STRATTERA) 40 MG capsule, Take  1 capsule (40 mg total) by mouth 2 (two) times daily., Disp: 180 capsule, Rfl: 0 .  baclofen (LIORESAL) 10 MG tablet, Take 1 tablet (10 mg total) by mouth 3 (three) times daily as needed for muscle spasms., Disp: 45 tablet, Rfl: 3 .  busPIRone (BUSPAR) 15 MG tablet, Take 1 tablet (15 mg total) by mouth at bedtime., Disp: 90 tablet, Rfl: 0 .  divalproex (DEPAKOTE ER) 250 MG 24 hr tablet, Take one tab po qAM and 2 tabs po qPM, Disp: 270 tablet, Rfl: 0 .  LORazepam (ATIVAN) 1 MG tablet, Take 1 tablet (1 mg total) by mouth 3 (three) times daily as needed for anxiety., Disp: 90 tablet, Rfl: 2 .  tadalafil (CIALIS) 5 MG tablet, TAKE 1 TABLET BY MOUTH DAILY AS NEEDED FOR ERECTILE DYSFUNCTION., Disp: 10 tablet, Rfl: 1 .  valACYclovir (VALTREX) 1000 MG tablet, Take 2 tablets (2,000 mg total) by mouth 2 (two) times daily., Disp: 4 tablet, Rfl: 6 .  venlafaxine XR (EFFEXOR-XR) 75 MG 24 hr capsule, Take 3 capsules (225 mg total) by mouth daily with breakfast., Disp: 270 capsule, Rfl: 0 .  cyclobenzaprine (FLEXERIL) 10 MG tablet, Take 1 tablet (10 mg total) by mouth 3 (three) times daily as needed. (Patient not taking: Reported on 06/18/2019), Disp: 30 tablet, Rfl: 0  Allergies  Allergen Reactions  . Ciprofloxacin     Acute kidney failure    Objective:   Pulse 97   Ht 6\' 6"  (1.981 m)   Wt 208 lb (94.3 kg)   BMI 24.04 kg/m  AAOx3, NAD NCAT, EOMI No obvious CN deficits Coloring WNL Pt is able to speak clearly, coherently without shortness of breath or increased work of breathing.  Thought process is linear.  Mood is appropriate.   Assessment and Plan:   Loss of appetite- ongoing issue.  Weight is stable.  He has no red flags that would indicate pathology- abd pain, N/V, early satiety.  Suspect it is just the incredibly busy pace of life right now w/ pandemic, doctoral dissertation, parent, etc.  Encouraged grazing throughout the day, taking time off as needed.  Will follow.   Annye Asa, MD  06/18/2019

## 2019-06-18 NOTE — Progress Notes (Signed)
I have discussed the procedure for the virtual visit with the patient who has given consent to proceed with assessment and treatment.   Pt unable to obtain vitals.   Jessica L Brodmerkel, CMA     

## 2019-07-22 ENCOUNTER — Other Ambulatory Visit: Payer: Self-pay

## 2019-07-22 ENCOUNTER — Encounter (HOSPITAL_COMMUNITY): Payer: Self-pay | Admitting: Psychiatry

## 2019-07-22 ENCOUNTER — Ambulatory Visit (INDEPENDENT_AMBULATORY_CARE_PROVIDER_SITE_OTHER): Payer: 59 | Admitting: Psychiatry

## 2019-07-22 DIAGNOSIS — Z5181 Encounter for therapeutic drug level monitoring: Secondary | ICD-10-CM | POA: Diagnosis not present

## 2019-07-22 DIAGNOSIS — R37 Sexual dysfunction, unspecified: Secondary | ICD-10-CM | POA: Diagnosis not present

## 2019-07-22 DIAGNOSIS — R41840 Attention and concentration deficit: Secondary | ICD-10-CM

## 2019-07-22 DIAGNOSIS — F411 Generalized anxiety disorder: Secondary | ICD-10-CM

## 2019-07-22 DIAGNOSIS — F3181 Bipolar II disorder: Secondary | ICD-10-CM

## 2019-07-22 DIAGNOSIS — F410 Panic disorder [episodic paroxysmal anxiety] without agoraphobia: Secondary | ICD-10-CM | POA: Diagnosis not present

## 2019-07-22 MED ORDER — LORAZEPAM 1 MG PO TABS: 1.0000 mg | ORAL_TABLET | Freq: Three times a day (TID) | ORAL | 2 refills | Status: DC | PRN

## 2019-07-22 MED ORDER — TADALAFIL 5 MG PO TABS: ORAL_TABLET | ORAL | 1 refills | Status: DC

## 2019-07-22 MED ORDER — DIVALPROEX SODIUM ER 250 MG PO TB24: ORAL_TABLET | ORAL | 0 refills | Status: DC

## 2019-07-22 MED ORDER — VENLAFAXINE HCL ER 75 MG PO CP24: 225.0000 mg | ORAL_CAPSULE | Freq: Every day | ORAL | 0 refills | Status: DC

## 2019-07-22 MED ORDER — ATOMOXETINE HCL 40 MG PO CAPS: 40.0000 mg | ORAL_CAPSULE | Freq: Two times a day (BID) | ORAL | 0 refills | Status: DC

## 2019-07-22 MED ORDER — BUSPIRONE HCL 15 MG PO TABS: 15.0000 mg | ORAL_TABLET | Freq: Every day | ORAL | 0 refills | Status: DC

## 2019-07-22 MED FILL — TADALAFIL 5 MG TABS: 5 | 30 days supply | Qty: 10 | Fill #0

## 2019-07-22 MED FILL — busPIRone HCL 15 MG TABS: 15 | 90 days supply | Qty: 90 | Fill #0

## 2019-07-22 MED FILL — LORAZEPAM 1 MG TABS: 1 | 30 days supply | Qty: 90 | Fill #0

## 2019-07-22 NOTE — Progress Notes (Signed)
Virtual Visit via Video Note  I connected with Matthew [name] on 07/22/19 at  7:45 AM EST by a video enabled telemedicine application and verified that I am speaking with the correct person using two identifiers.  Location: Patient: Home Provider: office   I discussed the limitations of evaluation and management by telemedicine and the availability of in person appointments. The patient expressed understanding and agreed to proceed.  History of Present Illness: Matthew [name] reports that he is doing well.  He has been working on maintaining a good work life balance.  He admits that work is overwhelming and he has needed to take half a tablet of Ativan 3 times since we last spoke.  He does continue to take the Ativan most days.  He is denying any recent concerns or symptoms of depression.  He has not felt the need to run away.  He denies SI/HI.  Sleep is good.  He is denying any manic or hypomanic-like symptoms.  He states that he is focused on the Strattera is helping.  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  He is looking forward to completing this so that he can be finished with 1 major accomplishment.  Matthew [name] does worry that due to current stressful work situations he could slide back into depression.  He is trying to stay vigilant to the best of his ability.   Observations/Objective:  General Appearance: Casual and Neat  Eye Contact:  Good  Speech:  Clear and Coherent and Normal Rate  Volume:  Normal  Mood:  Euthymic  Affect:  Restricted  Thought Process:  Goal Directed and Descriptions of Associations: Intact  Orientation:  Full (Time, Place, and Person)  Thought Content:  Logical  Suicidal Thoughts:  No  Homicidal Thoughts:  No  Memory:  Immediate;   Good  Judgement:  Good  Insight:  Good  Psychomotor Activity:  Normal  Concentration:  Concentration: Good  Recall:  Good  Fund of Knowledge:  Good  Language:  Good  Akathisia:  No  Handed:  Right  AIMS (if indicated):      Assets:  Communication Skills Desire for Improvement Financial Resources/Insurance Housing Intimacy Physical Health Resilience Social Support Talents/Skills Transportation Vocational/Educational  ADL's:  Intact  Cognition:  WNL  Sleep:   good     Assessment and Plan: Bipolar type II disorder; GAD; panic 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; insomnia; rule out ADHD  Status of current symptoms: Significant improvement in depression and anxiety  Continue: 1.  Strattera 40 mg p.o. twice daily for concentration and mood issues 2.  BuSpar 15 mg p.o. nightly for anxiety and augmentation of depression #3 Depakote 250 mg p.o. every morning 500 mg p.o. q. lunch for bipolar disorder 4.  Ativan 1 mg p.o. 3 times daily as needed anxiety and panic attacks 5.  Effexor XR 225 mg p.o. daily for mood, anxiety and panic attacks 6. Cialis 5 mg p.o. as needed sexual activity  Labs: ordered Depakote level, CBC (platelets), LFT's   Follow Up Instructions: In 2 months or sooner if needed   I discussed the assessment and treatment plan with the patient. The patient was provided an opportunity to ask questions and all were answered. The patient agreed with the plan and demonstrated an understanding of the instructions.   The patient was advised to call back or seek an in-person evaluation if the symptoms worsen or if the condition fails to improve as anticipated.  I provided 25 minutes of non-face-to-face time during this  encounter.   Charlcie Cradle, MD

## 2019-08-02 MED FILL — VENLAFAXINE HCL ER 75 MG CA: 75 | 90 days supply | Qty: 270 | Fill #0

## 2019-08-10 MED FILL — DIVALPROEX SOD ER 250 MG TA: 250 | 90 days supply | Qty: 270 | Fill #0

## 2019-09-07 DIAGNOSIS — M542 Cervicalgia: Secondary | ICD-10-CM | POA: Diagnosis not present

## 2019-09-07 DIAGNOSIS — J0100 Acute maxillary sinusitis, unspecified: Secondary | ICD-10-CM | POA: Diagnosis not present

## 2019-09-07 MED FILL — predniSONE 20 MG TABS: 20 | 6 days supply | Qty: 9 | Fill #0

## 2019-09-07 MED FILL — LORAZEPAM 1 MG TABS: 1 | 30 days supply | Qty: 90 | Fill #1

## 2019-09-07 MED FILL — TADALAFIL 5 MG TABS: 5 | 30 days supply | Qty: 10 | Fill #1

## 2019-09-07 MED FILL — CEFDINIR 300 MG CAPSULE: 300 | 10 days supply | Qty: 20 | Fill #0

## 2019-09-10 ENCOUNTER — Ambulatory Visit: Payer: 59 | Admitting: Family Medicine

## 2019-09-16 ENCOUNTER — Ambulatory Visit (HOSPITAL_COMMUNITY): Payer: 59 | Admitting: Psychiatry

## 2019-09-16 ENCOUNTER — Other Ambulatory Visit: Payer: Self-pay

## 2019-09-16 NOTE — Progress Notes (Unsigned)
Virtual Visit via Video Note  I connected with [name] on 09/16/19 at  4:00 PM EST by phone. We attempted to connect by video enabled telemedicine application but were unsuccessful. I verified that I am speaking with the correct person using two identifiers.  Location: Patient: home Provider: office   I discussed the limitations of evaluation and management by telemedicine and the availability of in person appointments. The patient expressed understanding and agreed to proceed.  History of Present Illness: ***   Observations/Objective:  General Appearance: {Appearance:22683}  Eye Contact:  {BHH EYE CONTACT:22684}  Speech:  {Speech:22685}  Volume:  {Volume (PAA):22686}  Mood:  {BHH MOOD:22306}  Affect:  {Affect (PAA):22687}  Thought Process:  {Thought Process (PAA):22688}  Orientation:  {BHH ORIENTATION (PAA):22689}  Thought Content:  {Thought Content:22690}  Suicidal Thoughts:  {ST/HT (PAA):22692}  Homicidal Thoughts:  {ST/HT (PAA):22692}  Memory:  {BHH MEMORY:22881}  Judgement:  {Judgement (PAA):22694}  Insight:  {Insight (PAA):22695}  Psychomotor Activity:  {Psychomotor (PAA):22696}  Concentration:  {Concentration:21399}  Recall:  {BHH GOOD/FAIR/POOR:22877}  Fund of Knowledge:  {BHH GOOD/FAIR/POOR:22877}  Language:  {BHH GOOD/FAIR/POOR:22877}  Akathisia:  {BHH YES OR NO:22294}  Handed:  {Handed:22697}  AIMS (if indicated):     Assets:  {Assets (PAA):22698}  ADL's:  {BHH RXV'Q:00867}  Cognition:  {chl bhh cognition:304700322}  Sleep:          I reviewed the informations below on 09/16/2019 and have updated it Assessment and Plan: Bipolar type II disorder; GAD; panic 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; insomnia; rule out ADHD   Status of current symptoms: mild increase in depression and anxiety related to recent stressors. Discussed ways to improve self care.    Continue: 1.  Strattera 40 mg p.o. twice daily for concentration and mood issues 2.  BuSpar 15  mg p.o. nightly for anxiety and augmentation of depression 3. Depakote 250 mg p.o. every morning 500 mg p.o. q. lunch for bipolar disorder 4.  Ativan 1 mg p.o. 3 times daily as needed anxiety and panic attacks 5.  Effexor XR 225 mg p.o. daily for mood, anxiety and panic attacks 6. Cialis 5 mg p.o. as needed sexual activity  Reminded to get labs done.   Follow Up Instructions: 1 weeks or sooner if needed   I discussed the assessment and treatment plan with the patient. The patient was provided an opportunity to ask questions and all were answered. The patient agreed with the plan and demonstrated an understanding of the instructions.   The patient was advised to call back or seek an in-person evaluation if the symptoms worsen or if the condition fails to improve as anticipated.  I provided 30 minutes of non-face-to-face time during this encounter.   [name], MD

## 2019-09-23 ENCOUNTER — Ambulatory Visit (INDEPENDENT_AMBULATORY_CARE_PROVIDER_SITE_OTHER): Payer: 59 | Admitting: Psychiatry

## 2019-09-23 ENCOUNTER — Other Ambulatory Visit: Payer: Self-pay

## 2019-09-23 ENCOUNTER — Encounter (HOSPITAL_COMMUNITY): Payer: Self-pay | Admitting: Psychiatry

## 2019-09-23 DIAGNOSIS — R41840 Attention and concentration deficit: Secondary | ICD-10-CM

## 2019-09-23 DIAGNOSIS — F411 Generalized anxiety disorder: Secondary | ICD-10-CM | POA: Diagnosis not present

## 2019-09-23 DIAGNOSIS — F3181 Bipolar II disorder: Secondary | ICD-10-CM

## 2019-09-23 DIAGNOSIS — F410 Panic disorder [episodic paroxysmal anxiety] without agoraphobia: Secondary | ICD-10-CM | POA: Diagnosis not present

## 2019-09-23 MED ORDER — BUSPIRONE HCL 15 MG PO TABS: 15.0000 mg | ORAL_TABLET | Freq: Every day | ORAL | 0 refills | Status: DC

## 2019-09-23 MED ORDER — VENLAFAXINE HCL ER 75 MG PO CP24: 225.0000 mg | ORAL_CAPSULE | Freq: Every day | ORAL | 0 refills | Status: DC

## 2019-09-23 MED ORDER — LORAZEPAM 1 MG PO TABS: 1.0000 mg | ORAL_TABLET | Freq: Three times a day (TID) | ORAL | 2 refills | Status: DC | PRN

## 2019-09-23 MED ORDER — DIVALPROEX SODIUM ER 250 MG PO TB24: ORAL_TABLET | ORAL | 0 refills | Status: DC

## 2019-09-23 MED ORDER — ATOMOXETINE HCL 40 MG PO CAPS: 40.0000 mg | ORAL_CAPSULE | Freq: Two times a day (BID) | ORAL | 0 refills | Status: DC

## 2019-09-23 MED FILL — ATOMOXETINE HCL 40 MG CAPS: 40 | 90 days supply | Qty: 180 | Fill #0

## 2019-09-23 NOTE — Progress Notes (Signed)
Virtual Visit via Video Note  I connected with Matthew [name] on 09/23/19 at  8:00 AM EST by phone. We attempted to connect by a video enabled telemedicine application but were unsuccessful. I verified that I am speaking with the correct person using two identifiers.  Location: Patient: Home Provider: office   I discussed the limitations of evaluation and management by telemedicine and the availability of in person appointments. The patient expressed understanding and agreed to proceed.   HPI: Matthew [name] reports that he is doing better overall. His anxiety is better and more manageable. He is taking Ativan 1/2 tab in the AM and lunch and 2 tabs at bedtime. He is sleeping better. Matthew [name] makes an effort to stop working at 9 PM.  He is also making a big effort to stop working in his bedroom and to keep it confined to his study.  Matthew [name] has blocked off some time and has canceled several meetings.  During this time he will take a nap or engage in some other activity.  He has also delegated some of the work to his team.  His wife is very supportive.  He notes that all of these changes have been very helpful and he hopes to continue them.  He is feeling optimistic and hopeful.  He does worry that he will not be able to maintain good self-care and fall back onto old routines.  He is denying any manic or hypomanic-like symptoms.  His depression is improved.  He did have some on and off feelings of wanting to run away earlier in the week.  Since that time those thoughts have disappeared.  His sleep is fair.  He is denying any SI/HI. Observations/Objective:  General Appearance: Unable to assess  Eye Contact:  Unable to assess  Speech:  Clear and Coherent and Normal Rate  Volume:  Normal  Mood:  Euthymic  Affect:  Full Range  Thought Process:  Goal Directed, Linear and Descriptions of Associations: Intact  Orientation:  Full (Time, Place, and Person)  Thought Content:  Logical  Suicidal Thoughts:  No   Homicidal Thoughts:  No  Memory:  Immediate;   Good  Judgement:  Good  Insight:  Good  Psychomotor Activity:  Unable to assess  Concentration:  Concentration: Good  Recall:  Good  Fund of Knowledge:  Good  Language:  Good  Akathisia:  Unable to assess  Handed:  Right  AIMS (if indicated):     Assets:  Communication Skills Desire for Improvement Financial Resources/Insurance Housing Intimacy Physical Health Resilience Social Support Talents/Skills Transportation Vocational/Educational  ADL's: Unable to assess  Cognition:  WNL  Sleep:        Assessment and Plan: .GAD (generalized anxiety disorder) - Plan: busPIRone (BUSPAR) 15 MG tablet, LORazepam (ATIVAN) 1 MG tablet, venlafaxine XR (EFFEXOR-XR) 75 MG 24 hr capsule  Bipolar 2 disorder (HCC) - Plan: atomoxetine (STRATTERA) 40 MG capsule, divalproex (DEPAKOTE ER) 250 MG 24 hr tablet, venlafaxine XR (EFFEXOR-XR) 75 MG 24 hr capsule  Poor concentration - Plan: atomoxetine (STRATTERA) 40 MG capsule  Panic 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 - Plan: LORazepam (ATIVAN) 1 MG tablet, venlafaxine XR (EFFEXOR-XR) 75 MG 24 hr capsule     Follow Up Instructions: On 10/28/19 at 8am   I discussed the assessment and treatment plan with the patient. The patient was provided an opportunity to ask questions and all were answered. The patient agreed with the plan and demonstrated an understanding of the instructions.   The patient was advised  to call back or seek an in-person evaluation if the symptoms worsen or if the condition fails to improve as anticipated.  I provided 25 minutes of non-face-to-face time during this encounter.   Charlcie Cradle, MD

## 2019-10-19 ENCOUNTER — Other Ambulatory Visit (HOSPITAL_COMMUNITY): Payer: Self-pay | Admitting: Psychiatry

## 2019-10-19 DIAGNOSIS — R37 Sexual dysfunction, unspecified: Secondary | ICD-10-CM

## 2019-10-19 MED FILL — BACLOFEN 10 MG TABS: 10 | 15 days supply | Qty: 45 | Fill #3

## 2019-10-22 MED FILL — TADALAFIL 5 MG TABS: 5 | 30 days supply | Qty: 10 | Fill #1

## 2019-10-22 MED FILL — LORAZEPAM 1 MG TABS: 1 | 30 days supply | Qty: 90 | Fill #0

## 2019-10-28 ENCOUNTER — Encounter (HOSPITAL_COMMUNITY): Payer: Self-pay | Admitting: Psychiatry

## 2019-10-28 ENCOUNTER — Ambulatory Visit (INDEPENDENT_AMBULATORY_CARE_PROVIDER_SITE_OTHER): Payer: 59 | Admitting: Psychiatry

## 2019-10-28 ENCOUNTER — Other Ambulatory Visit: Payer: Self-pay

## 2019-10-28 DIAGNOSIS — R41840 Attention and concentration deficit: Secondary | ICD-10-CM

## 2019-10-28 DIAGNOSIS — F411 Generalized anxiety disorder: Secondary | ICD-10-CM | POA: Diagnosis not present

## 2019-10-28 DIAGNOSIS — F3181 Bipolar II disorder: Secondary | ICD-10-CM

## 2019-10-28 DIAGNOSIS — F5105 Insomnia due to other mental disorder: Secondary | ICD-10-CM | POA: Diagnosis not present

## 2019-10-28 DIAGNOSIS — F99 Mental disorder, not otherwise specified: Secondary | ICD-10-CM

## 2019-10-28 DIAGNOSIS — F410 Panic disorder [episodic paroxysmal anxiety] without agoraphobia: Secondary | ICD-10-CM | POA: Diagnosis not present

## 2019-10-28 MED ORDER — ATOMOXETINE HCL 40 MG PO CAPS: 40.0000 mg | ORAL_CAPSULE | Freq: Two times a day (BID) | ORAL | 0 refills | Status: DC

## 2019-10-28 MED ORDER — DIVALPROEX SODIUM ER 250 MG PO TB24: ORAL_TABLET | ORAL | 0 refills | Status: DC

## 2019-10-28 MED ORDER — BUSPIRONE HCL 15 MG PO TABS: 15.0000 mg | ORAL_TABLET | Freq: Every day | ORAL | 0 refills | Status: DC

## 2019-10-28 MED ORDER — VENLAFAXINE HCL ER 75 MG PO CP24: 225.0000 mg | ORAL_CAPSULE | Freq: Every day | ORAL | 0 refills | Status: DC

## 2019-10-28 MED ORDER — LORAZEPAM 1 MG PO TABS: 1.0000 mg | ORAL_TABLET | Freq: Three times a day (TID) | ORAL | 2 refills | Status: DC | PRN

## 2019-10-28 MED FILL — VENLAFAXINE HCL ER 75 MG CA: 75 | 90 days supply | Qty: 270 | Fill #0

## 2019-10-28 MED FILL — busPIRone HCL 15 MG TABS: 15 | 90 days supply | Qty: 90 | Fill #0

## 2019-10-28 NOTE — Progress Notes (Signed)
Virtual Visit via Video Note  I connected with Matthew [name] on 10/28/19 at  8:00 AM EST by a video enabled telemedicine application and verified that I am speaking with the correct person using two identifiers.  Location: Patient: home Provider: office   I discussed the limitations of evaluation and management by telemedicine and the availability of in person appointments. The patient expressed understanding and agreed to proceed.  History of Present Illness: Matthew [name] is working on active self care. He is working on sleep hygiene and is no longer doing any work in bed. He has been delegating tasks to his staff more. Matthew [name] is journaling and reading. He is making an effort to end his work day at a certain time each day. The weather has been getting warmer which helps. He enjoys being outdoors.  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 Matthew [name] shared that a large number of people in his work and family circle passed away in about 3 weeks. He made some boundaries because he realized he could take on more emotional stress. His depression is stable. At some point he noticed he was feeling down and very tired. It was right around the time his aunt died. He took a day off. He slept most of the day and woke the next day feeling better. Matthew [name] denies SI/HI. He has experienced one stress induced panic attack since our last visit. He took Ativan and used some coping skills and he felt better. Matthew [name] always experiences some background anxiety. He is able to manage it. He denies any manic or hypomanic like symptoms.    Observations/Objective: Psychiatric Specialty Exam: General Appearance: Casual and Neat  Eye Contact:  Good  Speech:  Clear and Coherent and Normal Rate  Volume:  Normal  Mood:  Euthymic  Affect:  Full Range  Thought Process:  Goal Directed, Linear and Descriptions of Associations: Intact  Orientation:  Full (Time, Place, and Person)  Thought Content:   Logical  Suicidal Thoughts:  No  Homicidal Thoughts:  No  Memory:  Immediate;   Good  Judgement:  Good  Insight:  Good  Psychomotor Activity:  Normal  Concentration:  Concentration: Good  Recall:  Good  Fund of Knowledge:  Good  Language:  Good  Akathisia:  No  Handed:  Right  AIMS (if indicated):     Assets:  Communication Skills Desire for Improvement Financial Resources/Insurance Housing Intimacy Resilience Social Support Talents/Skills Transportation Vocational/Educational  ADL's:  Intact  Cognition:  WNL  Sleep:        Assessment and Plan: Bipolar 2 disorder (HCC) - Plan: atomoxetine (STRATTERA) 40 MG capsule, divalproex (DEPAKOTE ER) 250 MG 24 hr tablet, venlafaxine XR (EFFEXOR-XR) 75 MG 24 hr capsule  Poor concentration - Plan: atomoxetine (STRATTERA) 40 MG capsule  GAD (generalized anxiety disorder) - Plan: busPIRone (BUSPAR) 15 MG tablet, LORazepam (ATIVAN) 1 MG tablet, venlafaxine XR (EFFEXOR-XR) 75 MG 24 hr capsule  Panic 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 - Plan: LORazepam (ATIVAN) 1 MG tablet, venlafaxine XR (EFFEXOR-XR) 75 MG 24 hr capsule  Insomnia due to other mental disorder - utilizing sleep hygiene  Ongoing discussions about self care   Follow Up Instructions: In 2 months or sooner if needed   I discussed the assessment and treatment plan with the patient. The patient was provided an opportunity to ask questions and all were answered. The patient agreed with the plan and demonstrated an understanding of the instructions.   The patient was  advised to call back or seek an in-person evaluation if the symptoms worsen or if the condition fails to improve as anticipated.  I provided 40 minutes of non-face-to-face time during this encounter.   [name], MD

## 2019-11-04 MED FILL — DIVALPROEX SOD ER 250 MG TA: 250 | 90 days supply | Qty: 270 | Fill #0

## 2019-11-29 ENCOUNTER — Telehealth (HOSPITAL_COMMUNITY): Payer: Self-pay

## 2019-11-29 MED FILL — TADALAFIL 5 MG TABS: 5 | 30 days supply | Qty: 10 | Fill #0

## 2019-11-29 MED FILL — LORAZEPAM 1 MG TABS: 1 | 30 days supply | Qty: 90 | Fill #1

## 2019-11-29 NOTE — Telephone Encounter (Signed)
Prior authorization sent for tadalafil via Cover[employer]eds. [id number].  KEYMarian [name]

## 2019-12-01 NOTE — Telephone Encounter (Signed)
Received fax from [employer] stating prior authorization approved for Tadalafil 5mg . Approved 12/01/2019 to 11/29/2020.  Pharmacy notified of approval status.

## 2019-12-30 ENCOUNTER — Other Ambulatory Visit: Payer: Self-pay

## 2019-12-30 ENCOUNTER — Telehealth (INDEPENDENT_AMBULATORY_CARE_PROVIDER_SITE_OTHER): Payer: 59 | Admitting: Psychiatry

## 2019-12-30 DIAGNOSIS — R41840 Attention and concentration deficit: Secondary | ICD-10-CM

## 2019-12-30 DIAGNOSIS — F411 Generalized anxiety disorder: Secondary | ICD-10-CM | POA: Diagnosis not present

## 2019-12-30 DIAGNOSIS — F3181 Bipolar II disorder: Secondary | ICD-10-CM | POA: Diagnosis not present

## 2019-12-30 DIAGNOSIS — R37 Sexual dysfunction, unspecified: Secondary | ICD-10-CM

## 2019-12-30 DIAGNOSIS — F410 Panic disorder [episodic paroxysmal anxiety] without agoraphobia: Secondary | ICD-10-CM

## 2019-12-30 MED ORDER — ATOMOXETINE HCL 25 MG PO CAPS: 50.0000 mg | ORAL_CAPSULE | Freq: Two times a day (BID) | ORAL | 0 refills | Status: DC

## 2019-12-30 MED ORDER — LORAZEPAM 1 MG PO TABS: 1.0000 mg | ORAL_TABLET | Freq: Three times a day (TID) | ORAL | 2 refills | Status: DC | PRN

## 2019-12-30 MED ORDER — BUSPIRONE HCL 15 MG PO TABS: ORAL_TABLET | ORAL | 0 refills | Status: DC

## 2019-12-30 MED ORDER — VENLAFAXINE HCL ER 75 MG PO CP24: 225.0000 mg | ORAL_CAPSULE | Freq: Every day | ORAL | 0 refills | Status: DC

## 2019-12-30 MED ORDER — TADALAFIL 10 MG PO TABS: 10.0000 mg | ORAL_TABLET | Freq: Every day | ORAL | 1 refills | Status: DC | PRN

## 2019-12-30 MED ORDER — DIVALPROEX SODIUM ER 250 MG PO TB24: ORAL_TABLET | ORAL | 0 refills | Status: DC

## 2019-12-30 NOTE — Progress Notes (Signed)
Virtual Visit via Video Note  I connected with Matthew [name] on 12/30/19 at  8:00 AM EDT by a video enabled telemedicine application and verified that I am speaking with the correct person using two identifiers.  Location: Patient:home Provider: office   I discussed the limitations of evaluation and management by telemedicine and the availability of in person appointments. The patient expressed understanding and agreed to proceed.  History of Present Illness:  Cross shares he has been doing well. He has had some stress related to work that caused a few days of depression. Other times he experienced overwhelming anxiety. He spoke with his wife and felt better afterwards. For the most part his depression is manageable. He is denying anhedonia and hopelessness. His sleep is good. He denies SI/HI. He denies any recent panic attacks. He denies any manic or hypomanic like symptoms. Holman tells me that the Strattera does not seem as effective now a days. He finds himself distracted and others around him have also commented on it.    Observations/Objective: General Appearance: Casual  Eye Contact:  Good  Speech:  Clear and Coherent and Normal Rate  Volume:  Normal  Mood:  Euthymic  Affect:  Full Range  Thought Process:  Goal Directed, Linear and Descriptions of Associations: Intact  Orientation:  Full (Time, Place, and Person)  Thought Content:  Logical  Suicidal Thoughts:  No  Homicidal Thoughts:  No  Memory:  Immediate;   Good  Judgement:  Good  Insight:  Good  Psychomotor Activity:  Normal  Concentration:  Concentration: Good  Recall:  Good  Fund of Knowledge:  Good  Language:  Good  Akathisia:  No  Handed:  Right  AIMS (if indicated):     Assets:  Communication Skills Desire for Improvement Financial Resources/Insurance Housing Intimacy Leisure Time Physical Health Resilience Social Support Talents/Skills Transportation Vocational/Educational  ADL's:  Intact   Cognition:  WNL  Sleep:        Assessment and Plan: Bipolar 2 d/o; GAD; Panic 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; poor concentration; Insomnia  Increase Strattera to 50mg  po BID  Depakote ER 250mg  po qAm and 500mg  qLunch  Effexor XR 225mg  po qD  Buspar 7.5mg  po AM and 15mg  po qHS  Ativan 1mg  po TID prn anxiety  Cialis 5mg  prn sexual activity    Follow Up Instructions: In 3 months or sooner if needed   I discussed the assessment and treatment plan with the patient. The patient was provided an opportunity to ask questions and all were answered. The patient agreed with the plan and demonstrated an understanding of the instructions.   The patient was advised to call back or seek an in-person evaluation if the symptoms worsen or if the condition fails to improve as anticipated.  I provided 25 minutes of non-face-to-face time during this encounter.   , MD

## 2020-01-18 ENCOUNTER — Other Ambulatory Visit (HOSPITAL_COMMUNITY): Payer: Self-pay | Admitting: *Deleted

## 2020-01-18 ENCOUNTER — Telehealth (HOSPITAL_COMMUNITY): Payer: Self-pay | Admitting: *Deleted

## 2020-01-18 NOTE — Telephone Encounter (Signed)
Writer received t/c from [facility] regarding coverage for Strattera 25mg  to be taken as 2 caps bid. Pt insurance will only cover 50mg  caps qd. Please review and advise.

## 2020-01-20 NOTE — Telephone Encounter (Signed)
Can we do a prior authorization. This is a dose increase.

## 2020-01-28 ENCOUNTER — Telehealth (HOSPITAL_COMMUNITY): Payer: Self-pay | Admitting: *Deleted

## 2020-01-31 ENCOUNTER — Telehealth (HOSPITAL_COMMUNITY): Payer: Self-pay | Admitting: *Deleted

## 2020-01-31 MED FILL — busPIRone HCL 15 MG TABS: 15 | 90 days supply | Qty: 135 | Fill #0

## 2020-01-31 MED FILL — LORazepam 1 MG TABS: 1 | 30 days supply | Qty: 90 | Fill #1

## 2020-01-31 MED FILL — TADALAFIL 10 MG TABS: 10 | 10 days supply | Qty: 10 | Fill #1

## 2020-01-31 MED FILL — VENLAFAXINE HCL ER 75 MG CA: 75 | 90 days supply | Qty: 270 | Fill #0

## 2020-01-31 MED FILL — ATOMOXETINE HCL 40 MG CAPS: 40 | 90 days supply | Qty: 180 | Fill #0

## 2020-01-31 NOTE — Telephone Encounter (Signed)
PA for increased dose of Strattera has been denied due to "being asked to cover a larger amount of drug than allowed under quantity limit rule"pernicious anemia reference # 9910. Pt called and states that he's going back to the "old dose" until he can speak with you.

## 2020-02-02 MED FILL — DIVALPROEX SOD ER 250 MG TA: 250 | 90 days supply | Qty: 270 | Fill #0

## 2020-02-17 ENCOUNTER — Telehealth (HOSPITAL_COMMUNITY): Payer: 59 | Admitting: Psychiatry

## 2020-02-17 ENCOUNTER — Other Ambulatory Visit: Payer: Self-pay

## 2020-02-17 NOTE — Progress Notes (Incomplete)
Virtual Visit via Telephone Note  I connected with Matthew [name] on 02/17/20 at  9:15 AM EDT by telephone and verified that I am speaking with the correct person using two identifiers.  Location: Patient: *** Provider: ***   I discussed the limitations, risks, security and privacy concerns of performing an evaluation and management service by telephone and the availability of in person appointments. I also discussed with the patient that there may be a patient responsible charge related to this service. The patient expressed understanding and agreed to proceed.   History of Present Illness: Matthew [name] had a rough week about 2 weeks ago. One day he was struggling with his focus at work. He was feeling anxious and depressed. Matthew [name] cancelled some meeting over the course of the week. Matthew [name] started to feel like he did prior to ECT. He was overwhelmed with paralyzing anxiety and has fleeting, brief passive SI without plan or intent.He thinks it is due to work stress. Last week he started to feel a little better and feels like he is starting to get better. He is going to his meetings. He is working on trying to deal with feeling overwhelmed. He admits he has not been doing his self care as well as before. He is not journeling and he is working late into the night. Some nights he is anxious and is unable to fall asleep.  Matthew [name] denies manic and hypomanic like symptoms.  Today he denies SI/HI for over one week. Matthew [name] had a big thing project due today and feels a small sense of relief now that it is done. He would like to keep his meds the same for now and meet again in a few weeks.    Observations/Objective:  General Appearance: unable to assess  Eye Contact:  unable to assess  Speech:  {Speech:22685}  Volume:  {Volume (PAA):22686}  Mood:  {BHH MOOD:22306}  Affect:  {Affect (PAA):22687}  Thought Process:  {Thought Process (PAA):22688}  Orientation:  {BHH ORIENTATION (PAA):22689}  Thought  Content:  {Thought Content:22690}  Suicidal Thoughts:  {ST/HT (PAA):22692}  Homicidal Thoughts:  {ST/HT (PAA):22692}  Memory:  {BHH MEMORY:22881}  Judgement:  {Judgement (PAA):22694}  Insight:  {Insight (PAA):22695}  Psychomotor Activity: unable to assess  Concentration:  {Concentration:21399}  Recall:  {BHH GOOD/FAIR/POOR:22877}  Fund of Knowledge:  {BHH GOOD/FAIR/POOR:22877}  Language:  {BHH GOOD/FAIR/POOR:22877}  Akathisia:  unable to assess  Handed:  {Handed:22697}  AIMS (if indicated):     Assets:  {Assets (PAA):22698}  ADL's:  unable to assess  Cognition:  {chl bhh cognition:304700322}  Sleep:         Assessment and Plan:  ***   Follow Up Instructions: In 1-2 weeks or sooner if needed   I discussed the assessment and treatment plan with the patient. The patient was provided an opportunity to ask questions and all were answered. The patient agreed with the plan and demonstrated an understanding of the instructions.   The patient was advised to call back or seek an in-person evaluation if the symptoms worsen or if the condition fails to improve as anticipated.  I provided 15 minutes of non-face-to-face time during this encounter.   [name], MD

## 2020-02-22 NOTE — Telephone Encounter (Signed)
Erroneous encounter

## 2020-02-24 ENCOUNTER — Other Ambulatory Visit: Payer: Self-pay

## 2020-02-24 ENCOUNTER — Encounter (HOSPITAL_COMMUNITY): Payer: Self-pay | Admitting: Psychiatry

## 2020-02-24 ENCOUNTER — Telehealth (INDEPENDENT_AMBULATORY_CARE_PROVIDER_SITE_OTHER): Payer: 59 | Admitting: Psychiatry

## 2020-02-24 DIAGNOSIS — F3181 Bipolar II disorder: Secondary | ICD-10-CM | POA: Diagnosis not present

## 2020-02-24 DIAGNOSIS — F410 Panic disorder [episodic paroxysmal anxiety] without agoraphobia: Secondary | ICD-10-CM

## 2020-02-24 DIAGNOSIS — F411 Generalized anxiety disorder: Secondary | ICD-10-CM

## 2020-02-24 DIAGNOSIS — R41840 Attention and concentration deficit: Secondary | ICD-10-CM

## 2020-02-24 MED ORDER — DIVALPROEX SODIUM ER 500 MG PO TB24: 500.0000 mg | ORAL_TABLET | Freq: Every day | ORAL | 0 refills | Status: DC

## 2020-02-24 MED ORDER — ATOMOXETINE HCL 40 MG PO CAPS: 40.0000 mg | ORAL_CAPSULE | Freq: Two times a day (BID) | ORAL | 0 refills | Status: DC

## 2020-02-24 NOTE — Progress Notes (Signed)
Virtual Visit via Video Note  I connected with Matthew [name] on 02/24/20 at  8:00 AM EDT by a video enabled telemedicine application and verified that I am speaking with the correct person using two identifiers.  Location: Patient: home Provider: office   I discussed the limitations of evaluation and management by telemedicine and the availability of in person appointments. The patient expressed understanding and agreed to proceed.  History of Present Illness: "Probably the same". Matthew [name] feels the depression is unchanged but it is not as bad as it has been in the past. He has been going to work but he has skipped several meetings. It takes a lot motivation to do anything. He has been procrastinating. At home things are mostly ok. His wife told him he wasn't being supportive. He denies SI/HI. Matthew [name] is sleeping ok. He is a little more irritable but denies any other manic or hypomanic like symptoms. Matthew [name]'s anxiety is high and it is interfering with his work. He denies any recent panic attacks. Ativan seems to help more when he is really anxious. At baseline it does lower gus general level of anxiety. He is taking 1/2 tab in AM and lunch and at bedtime he takes 1 tab.    Observations/Objective: Psychiatric Specialty Exam: There were no vitals taken for this visit.There is no height or weight on file to calculate BMI.  General Appearance: Casual and Neat  Eye Contact:  Fair  Speech:  Clear and Coherent and Normal Rate  Volume:  Normal  Mood:  Anxious and Depressed  Affect:  Congruent  Thought Process:  Goal Directed, Linear and Descriptions of Associations: Intact  Orientation:  Full (Time, Place, and Person)  Thought Content:  Logical  Suicidal Thoughts:  No  Homicidal Thoughts:  No  Memory:  Immediate;   Good  Judgement:  Good  Insight:  Good  Psychomotor Activity:  Normal  Concentration:  Concentration: Good  Recall:  Good  Fund of Knowledge:  Good  Language:  Good   Akathisia:  No  Handed:  Right  AIMS (if indicated):     Assets:  Communication Skills Desire for Improvement Financial Resources/Insurance Housing Leisure Time Resilience Social Support Talents/Skills Transportation Vocational/Educational  ADL's:  Intact  Cognition:  WNL  Sleep:       I reviewed the information below on 02/24/20 and have updated it Assessment and Plan:  Bipolar 2 d/o; GAD; Panic 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; poor concentration; Insomnia    Strattera 40mg  po BID- insurance would not cover 50mg  BID   Increase Depakote ER 500mg  po qAm and 500mg  qLunch   Effexor XR 225mg  po qD   Buspar 7.5mg  po AM and 15mg  po qHS   Ativan 1mg  po TID prn anxiety   Cialis 5mg  prn sexual activity   Matthew [name] plans to take 1-2 weeks off on vacation and will contact [facility] for therapy. He will focus on self care, coping for anxiety and burnout.  I educated Matthew [name] about NAMI to help support his wife  Follow Up Instructions: In 2 weeks or sooner if needed   I discussed the assessment and treatment plan with the patient. The patient was provided an opportunity to ask questions and all were answered. The patient agreed with the plan and demonstrated an understanding of the instructions.   The patient was advised to call back or seek an in-person evaluation if the symptoms worsen or if the condition fails to improve as anticipated.  I provided 30 minutes of non-face-to-face  time during this encounter.   Matthew Darter, MD

## 2020-03-09 ENCOUNTER — Telehealth (HOSPITAL_COMMUNITY): Payer: 59 | Admitting: Psychiatry

## 2020-03-09 ENCOUNTER — Other Ambulatory Visit: Payer: Self-pay

## 2020-03-13 ENCOUNTER — Other Ambulatory Visit (HOSPITAL_COMMUNITY): Payer: Self-pay | Admitting: Psychiatry

## 2020-03-13 DIAGNOSIS — R37 Sexual dysfunction, unspecified: Secondary | ICD-10-CM

## 2020-03-13 MED FILL — LORazepam 1 MG TABS: 1 | 30 days supply | Qty: 90 | Fill #2

## 2020-03-27 MED FILL — TADALAFIL 10 MG TABS: 10 | 10 days supply | Qty: 10 | Fill #0

## 2020-03-30 ENCOUNTER — Other Ambulatory Visit (HOSPITAL_COMMUNITY): Payer: Self-pay | Admitting: Psychiatry

## 2020-03-30 ENCOUNTER — Telehealth (INDEPENDENT_AMBULATORY_CARE_PROVIDER_SITE_OTHER): Payer: 59 | Admitting: Psychiatry

## 2020-03-30 ENCOUNTER — Other Ambulatory Visit: Payer: Self-pay

## 2020-03-30 DIAGNOSIS — F411 Generalized anxiety disorder: Secondary | ICD-10-CM

## 2020-03-30 DIAGNOSIS — F410 Panic disorder [episodic paroxysmal anxiety] without agoraphobia: Secondary | ICD-10-CM | POA: Diagnosis not present

## 2020-03-30 DIAGNOSIS — R37 Sexual dysfunction, unspecified: Secondary | ICD-10-CM

## 2020-03-30 DIAGNOSIS — R41840 Attention and concentration deficit: Secondary | ICD-10-CM

## 2020-03-30 DIAGNOSIS — F3181 Bipolar II disorder: Secondary | ICD-10-CM

## 2020-03-30 MED ORDER — TADALAFIL 10 MG PO TABS: ORAL_TABLET | ORAL | 0 refills | Status: DC

## 2020-03-30 MED ORDER — DIVALPROEX SODIUM ER 250 MG PO TB24: ORAL_TABLET | ORAL | 0 refills | Status: DC

## 2020-03-30 MED ORDER — BUSPIRONE HCL 15 MG PO TABS: ORAL_TABLET | ORAL | 0 refills | Status: DC

## 2020-03-30 MED ORDER — LORAZEPAM 1 MG PO TABS: 1.0000 mg | ORAL_TABLET | Freq: Three times a day (TID) | ORAL | 2 refills | Status: DC | PRN

## 2020-03-30 MED ORDER — ATOMOXETINE HCL 40 MG PO CAPS: 40.0000 mg | ORAL_CAPSULE | Freq: Two times a day (BID) | ORAL | 0 refills | Status: DC

## 2020-03-30 MED ORDER — VENLAFAXINE HCL ER 75 MG PO CP24: 225.0000 mg | ORAL_CAPSULE | Freq: Every day | ORAL | 0 refills | Status: DC

## 2020-03-30 NOTE — Progress Notes (Signed)
Virtual Visit via Video Note  I connected with Matthew [name] on 03/30/20 at  8:00 AM EDT by a video enabled telemedicine application and verified that I am speaking with the correct person using two identifiers.  Location: Patient: car Provider: office   I discussed the limitations of evaluation and management by telemedicine and the availability of in person appointments. The patient expressed understanding and agreed to proceed.  History of Present Illness: Matthew [name] shares that he has been doing well. Overall his depression and anxiety have significantly improved. He is motivated and is doing well at work. He is able to focus and accomplish tasks. Matthew [name] states he may have had a few days of hypomanic like symptoms a couple of weeks ago. During this time he had a mild increase in energy, improved mood, some racing thoughts but no impulsivity. He denies SI/HI.   Observations/Objective: Psychiatric Specialty Exam: ROS  There were no vitals taken for this visit.There is no height or weight on file to calculate BMI.  General Appearance: Casual  Eye Contact:  Good  Speech:  Clear and Coherent and Normal Rate  Volume:  Normal  Mood:  Euthymic  Affect:  Full Range  Thought Process:  Goal Directed and Descriptions of Associations: Intact  Orientation:  Full (Time, Place, and Person)  Thought Content:  Logical  Suicidal Thoughts:  No  Homicidal Thoughts:  No  Memory:  Immediate;   Good  Judgement:  Good  Insight:  Good  Psychomotor Activity:  Normal  Concentration:  Concentration: Good  Recall:  Good  Fund of Knowledge:  Good  Language:  Good  Akathisia:  No  Handed:  Right  AIMS (if indicated):     Assets:  Communication Skills Desire for Improvement Financial Resources/Insurance Housing Intimacy Leisure Time Resilience Social Support Talents/Skills Transportation Vocational/Educational  ADL's:  Intact  Cognition:  WNL  Sleep:       I reviewed the information  below on 03/30/20 and have updated it.  Assessment and Plan:  Bipolar 2 d/o; GAD; Panic 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; poor concentration; Insomnia    Strattera 40mg  po BID- insurance would not cover 50mg  BID   decrease Depakote ER 250mg  po qAm and 500mg  qLunch   Effexor XR 225mg  po qD   Buspar 7.5mg  po AM and 15mg  po qHS   Ativan 1mg  po TID prn anxiety   Cialis 5mg  prn sexual activity    Follow Up Instructions: In 4 weeks or sooner if needed   I discussed the assessment and treatment plan with the patient. The patient was provided an opportunity to ask questions and all were answered. The patient agreed with the plan and demonstrated an understanding of the instructions.   The patient was advised to call back or seek an in-person evaluation if the symptoms worsen or if the condition fails to improve as anticipated.  I provided 20 minutes of non-face-to-face time during this encounter.   , MD

## 2020-04-05 ENCOUNTER — Telehealth: Payer: Self-pay | Admitting: Family Medicine

## 2020-04-05 NOTE — Telephone Encounter (Signed)
Scheduled for Friday, 8/20 at 2:30p.

## 2020-04-05 NOTE — Telephone Encounter (Signed)
FYI,  Called and spoke with pt. He is concerned that he has been losing weight due to lack of appetite. First visit with [place] 08/2018 pt was 236lbs and is currently 200lbs. His wife is very concerned about this. Pt was made an appt on Friday at 2:30pm for in office visit.

## 2020-04-05 NOTE — Telephone Encounter (Signed)
Pt called and asked if CMA would give him a call. He is concerned about his weight and wanted to know if he should be seen. Please advise.

## 2020-04-07 ENCOUNTER — Encounter: Payer: Self-pay | Admitting: Family Medicine

## 2020-04-07 ENCOUNTER — Ambulatory Visit: Payer: 59 | Admitting: Family Medicine

## 2020-04-07 ENCOUNTER — Other Ambulatory Visit: Payer: Self-pay

## 2020-04-07 VITALS — BP 118/74 | HR 73 | Temp 97.3°F | Ht 78.0 in | Wt 206.0 lb

## 2020-04-07 DIAGNOSIS — R634 Abnormal weight loss: Secondary | ICD-10-CM

## 2020-04-07 DIAGNOSIS — R350 Frequency of micturition: Secondary | ICD-10-CM

## 2020-04-07 LAB — POC URINALSYSI DIPSTICK (AUTOMATED)
Bilirubin, UA: NEGATIVE
Blood, UA: NEGATIVE
Glucose, UA: NEGATIVE
Ketones, UA: NEGATIVE
Nitrite, UA: NEGATIVE
Protein, UA: NEGATIVE
Spec Grav, UA: <=1.005 — AB (ref 1.010–1.025)
Urobilinogen, UA: NEGATIVE E.U./dL — AB
pH, UA: 6 (ref 5.0–8.0)

## 2020-04-07 NOTE — Progress Notes (Signed)
   Subjective:    Patient ID: Matthew [name], male    DOB: 28-May-1971, 49 y.o.   MRN: [id number]  HPI Weight loss- pt was 223 lbs last July and now 206 lbs.  Weighed 200 lbs this morning at home.  Not trying to lose weight but overtime has become used to not eating during the day.  Does not eat breakfast- stopped eating breakfast during COVID.  Stopped eating lunch during COVID due to busy schedule.  Will eat dinner nightly.  Will have intermittent smoker's cough.  No bone pain, N/V, night sweats.  No fevers/chills.  Urinary frequency- sxs started 'a couple months ago'.  No increase in caffeine- 'it's always high'.  Has good fluid intake throughout the day.   Review of Systems For ROS see HPI   This visit occurred during the SARS-CoV-2 public health emergency.  Safety protocols were in place, including screening questions prior to the visit, additional usage of staff PPE, and extensive cleaning of exam room while observing appropriate contact time as indicated for disinfecting solutions.       Objective:   Physical Exam Vitals reviewed.  Constitutional:      General: He is not in acute distress.    Appearance: Normal appearance. He is well-developed.  HENT:     Head: Normocephalic and atraumatic.  Eyes:     Conjunctiva/sclera: Conjunctivae normal.     Pupils: Pupils are equal, round, and reactive to light.  Neck:     Thyroid: No thyromegaly.  Cardiovascular:     Rate and Rhythm: Normal rate and regular rhythm.     Heart sounds: Normal heart sounds. No murmur heard.   Pulmonary:     Effort: Pulmonary effort is normal. No respiratory distress.     Breath sounds: Normal breath sounds.  Abdominal:     General: Bowel sounds are normal. There is no distension.     Palpations: Abdomen is soft.  Musculoskeletal:     Cervical back: Normal range of motion and neck supple.  Lymphadenopathy:     Cervical: No cervical adenopathy.  Skin:    General: Skin is warm and dry.    Neurological:     Mental Status: He is alert and oriented to person, place, and time.     Cranial Nerves: No cranial nerve deficit.  Psychiatric:        Behavior: Behavior normal.           Assessment & Plan:  Unintentional Weight Loss- deteriorated.  Pt has dropped ~20 lbs in a year w/o intending to.  He does admit that he will rarely eat throughout the day.  Discussed calories in vs calories out and he's clearly in a caloric deficit.  Stressed need for snacking/grazing throughout the day if he doesn't feel he has time for a full meal.  Given his lack of systemic sxs, my suspicion for something more sinister is low, but it's still possible.  Check labs, CXR, urine cx.  Will follow closely  Urinary frequency- new.  Suspect this is due to increased intake.  UA unremarkable but will send for cx to be sure.  Also check PSA.  Will follow.

## 2020-04-07 NOTE — Patient Instructions (Addendum)
Follow up in 1 month to recheck weight We'll notify you of your lab results and make any changes if needed Go to [address] and get your chest xray- at your convenience Please add 1-2 protein shakes daily (1 at breakfast and 1 throughout the day) Make sure you have adequate food/snacks on hand- grab and go such as string cheese, applesauce, yogurt, fruit, pretzels, etc) Call with any questions or concerns Stay Safe!  Stay Healthy!!

## 2020-04-10 LAB — HEMOGLOBIN A1C
Hgb A1c MFr Bld: 5.3 % of total Hgb (ref <5.7)
Mean Plasma Glucose: 105 (calc)
eAG (mmol/L): 5.8 (calc)

## 2020-04-10 LAB — HEPATIC FUNCTION PANEL
AG Ratio: 1.7 (calc) (ref 1.0–2.5)
ALT: 25 U/L (ref 9–46)
AST: 14 U/L (ref 10–40)
Albumin: 4.7 g/dL (ref 3.6–5.1)
Alkaline phosphatase (APISO): 66 U/L (ref 36–130)
Bilirubin, Direct: 0.1 mg/dL (ref 0.0–0.2)
Globulin: 2.7 g/dL (calc) (ref 1.9–3.7)
Indirect Bilirubin: 0.4 mg/dL (calc) (ref 0.2–1.2)
Total Bilirubin: 0.5 mg/dL (ref 0.2–1.2)
Total Protein: 7.4 g/dL (ref 6.1–8.1)

## 2020-04-10 LAB — BASIC METABOLIC PANEL
BUN: 9 mg/dL (ref 7–25)
CO2: 31 mmol/L (ref 20–32)
Calcium: 9.5 mg/dL (ref 8.6–10.3)
Chloride: 103 mmol/L (ref 98–110)
Creat: 0.87 mg/dL (ref 0.60–1.35)
Glucose, Bld: 84 mg/dL (ref 65–99)
Potassium: 4.3 mmol/L (ref 3.5–5.3)
Sodium: 139 mmol/L (ref 135–146)

## 2020-04-10 LAB — CBC WITH DIFFERENTIAL/PLATELET
Absolute Monocytes: 529 cells/uL (ref 200–950)
Basophils Absolute: 59 cells/uL (ref 0–200)
Basophils Relative: 0.6 %
Eosinophils Absolute: 284 cells/uL (ref 15–500)
Eosinophils Relative: 2.9 %
HCT: 47.6 % (ref 38.5–50.0)
Hemoglobin: 16.5 g/dL (ref 13.2–17.1)
Lymphs Abs: 3018 cells/uL (ref 850–3900)
MCH: 31.9 pg (ref 27.0–33.0)
MCHC: 34.7 g/dL (ref 32.0–36.0)
MCV: 91.9 fL (ref 80.0–100.0)
MPV: 10.6 fL (ref 7.5–12.5)
Monocytes Relative: 5.4 %
Neutro Abs: 5909 cells/uL (ref 1500–7800)
Neutrophils Relative %: 60.3 %
Platelets: 220 10*3/uL (ref 140–400)
RBC: 5.18 10*6/uL (ref 4.20–5.80)
RDW: 12.9 % (ref 11.0–15.0)
Total Lymphocyte: 30.8 %
WBC: 9.8 10*3/uL (ref 3.8–10.8)

## 2020-04-10 LAB — URINE CULTURE
MICRO NUMBER:: 10853677
Result:: NO GROWTH
SPECIMEN QUALITY:: ADEQUATE

## 2020-04-10 LAB — TSH: TSH: 0.86 mIU/L (ref 0.40–4.50)

## 2020-04-10 LAB — SEDIMENTATION RATE: Sed Rate: 2 mm/h (ref 0–15)

## 2020-04-10 LAB — ANA: Anti Nuclear Antibody (ANA): NEGATIVE

## 2020-04-12 ENCOUNTER — Other Ambulatory Visit: Payer: Self-pay | Admitting: Family Medicine

## 2020-04-12 DIAGNOSIS — R634 Abnormal weight loss: Secondary | ICD-10-CM

## 2020-04-14 ENCOUNTER — Ambulatory Visit: Payer: 59 | Admitting: Family Medicine

## 2020-04-26 DIAGNOSIS — H5213 Myopia, bilateral: Secondary | ICD-10-CM | POA: Diagnosis not present

## 2020-04-27 ENCOUNTER — Encounter (HOSPITAL_COMMUNITY): Payer: Self-pay | Admitting: Psychiatry

## 2020-04-27 ENCOUNTER — Telehealth (HOSPITAL_COMMUNITY): Payer: 59 | Admitting: Psychiatry

## 2020-04-27 ENCOUNTER — Other Ambulatory Visit: Payer: Self-pay

## 2020-05-08 ENCOUNTER — Other Ambulatory Visit: Payer: Self-pay

## 2020-05-08 ENCOUNTER — Encounter: Payer: Self-pay | Admitting: Family Medicine

## 2020-05-08 ENCOUNTER — Ambulatory Visit (INDEPENDENT_AMBULATORY_CARE_PROVIDER_SITE_OTHER): Payer: 59 | Admitting: Family Medicine

## 2020-05-08 ENCOUNTER — Other Ambulatory Visit: Payer: Self-pay | Admitting: Family Medicine

## 2020-05-08 VITALS — HR 87

## 2020-05-08 DIAGNOSIS — Z20822 Contact with and (suspected) exposure to covid-19: Secondary | ICD-10-CM | POA: Diagnosis not present

## 2020-05-08 LAB — POC INFLUENZA A&B (BINAX/QUICKVUE)
Influenza A, POC: NEGATIVE
Influenza B, POC: NEGATIVE

## 2020-05-08 LAB — POCT RAPID STREP A (OFFICE): Rapid Strep A Screen: NEGATIVE

## 2020-05-08 MED ORDER — AMOXICILLIN-POT CLAVULANATE 875-125 MG PO TABS: 1.0000 | ORAL_TABLET | Freq: Two times a day (BID) | ORAL | 0 refills | Status: DC

## 2020-05-08 NOTE — Patient Instructions (Addendum)
You look covid like to me, but strep is so faintly negative.   1) sending in augmentin BID for 10 days to cover strep and lungs.  2) throw away toothbrush in 24 hours.  3) warm salt water gurgles   For covid start the following.Marland KitchenMarland Kitchen 1) zinc sulfate 220mg /day if can't get this get elemental zinc 100mg /day.sulfate is hard to find.  2) vitamin D3 5000IU/day 3) quercetin 500mg  twice a day 4) vitamin C 1000mg  three times a day 5) melatonin 10mg /night only. Will make you drowsy.  6) aspirin, do not see any contraindication.  7) gurgle with mouth wash three times a day.  -get outside and deep breathing.    If covid positive, would recommend infusion. If you see result before me, can call infusion center:  [phone].   Pulse ox >93-94%    You know the drill quarantine until test comes back. Let me know if you need anything,  Dr. 

## 2020-05-08 NOTE — Progress Notes (Signed)
Patient: Matthew [name] MRN: [id number] DOB: 03-03-1971 PCP: Sheliah Hatch, MD     Subjective:  Chief Complaint  Patient presents with  . Sore Throat  . Headache    HPI: The patient is a 49 y.o. male who presents today for sore throat, body aches and headaches that started yesterday. He has taken tylenol and motrin prn for pain. He denies any sick contacts. He did take a rapid covid test that was negative yesterday. He does smoke. He went to 2 urgent cares this AM. Denies any shortness of breath, wheezing, sinus symptoms, loss of taste or smell. He is also very tired. States he had strep throat years ago and it feels the same. He has not taken his temperature, but feels chilled.   I am seeing him at the car so exam is limited.   Review of Systems  Constitutional: Positive for chills, fatigue and fever.  HENT: Positive for sore throat. Negative for congestion, sinus pressure, sinus pain and trouble swallowing.   Respiratory: Negative for cough, shortness of breath and wheezing.   Cardiovascular: Negative for chest pain and palpitations.  Gastrointestinal: Negative for abdominal pain.  Genitourinary: Negative for dysuria.  Neurological: Positive for headaches.    Allergies Patient is allergic to ciprofloxacin.  Past Medical History Patient  has a past medical history of Acute kidney failure (HCC) (01/2012), Anxiety, Bipolar disorder (HCC), COPD (chronic obstructive pulmonary disease) (HCC), Depression, and Low back pain.  Surgical History Patient  has a past surgical history that includes No past surgeries.  Family History Pateint's family history includes Alcohol abuse in his brother and father; Anxiety disorder in his mother; Bipolar disorder in his paternal aunt and sister; Cancer in his maternal grandmother and paternal grandmother; Drug abuse in his brother and father; Hypertension in his maternal grandfather and paternal grandfather; Schizophrenia in his  paternal aunt.  Social History Patient  reports that he has been smoking cigarettes. He has a 20.00 pack-year smoking history. He has never used smokeless tobacco. He reports that he does not drink alcohol and does not use drugs.    Objective: Vitals:   05/08/20 1327  Pulse: 87  SpO2: 97%    There is no height or weight on file to calculate BMI.  Physical Exam Vitals reviewed.  Constitutional:      General: He is not in acute distress.    Appearance: He is normal weight. He is ill-appearing. He is not diaphoretic.     Comments: In car with eyes closed. Does not feel good.   HENT:     Head: Normocephalic and atraumatic.     Nose: No congestion.     Mouth/Throat:     Mouth: Mucous membranes are moist. No oral lesions.     Pharynx: No oropharyngeal exudate.     Comments: Limited exam. Do not have light to see back of pharynx or tonsils  Cardiovascular:     Rate and Rhythm: Normal rate and regular rhythm.     Heart sounds: Normal heart sounds.  Pulmonary:     Effort: Pulmonary effort is normal. No respiratory distress.     Breath sounds: Normal breath sounds. No wheezing or rales.  Abdominal:     General: Bowel sounds are normal.     Palpations: Abdomen is soft.     Tenderness: There is no abdominal tenderness.  Musculoskeletal:     Cervical back: Normal range of motion and neck supple.  Lymphadenopathy:     Cervical: Cervical  adenopathy present.  Skin:    General: Skin is warm.      Flu: negative Strep: faintly positive Covid: pending     Assessment/plan: 1. Suspected COVID-19 virus infection Rapid test negative, but symptoms consistent with covid, sending for pcr test today.  -oxygen wnl and lungs sound good -starting nutraceutical bundle of vitamins and aspirin.  -would qualify for infusion and if positive will set this up, number also given.  -he is vaccinated -quarantine until results back.  -strict precautions given.  - Novel Coronavirus, NAA ([facility]);  Future  2. Sore throat and headache -strep is faintly positive+cervical adenopathy, +possible fever. Will cover with augmentin for lung protection with smoker/asthma and possible strep. Encouraged to take with food.   -if covid negative and symptoms do not improve needs close f/u with pcp.   This visit occurred during the SARS-CoV-2 public health emergency.  Safety protocols were in place, including screening questions prior to the visit, additional usage of staff PPE, and extensive cleaning of exam room while observing appropriate contact time as indicated for disinfecting solutions.      Return if symptoms worsen or fail to improve.     Orland Mustard, MD [facility]  05/08/2020

## 2020-05-11 ENCOUNTER — Telehealth: Payer: Self-pay | Admitting: General Practice

## 2020-05-11 NOTE — Telephone Encounter (Signed)
Called pt today to inform we are still waiting on his COVID results. Asked how he was feeling, he advised that after two doses of antibiotics he was feeling much better. States that he feels almost all the way back to normal but is still having some pain.

## 2020-05-12 LAB — NOVEL CORONAVIRUS, NAA: SARS-CoV-2, NAA: NOT DETECTED

## 2020-05-15 ENCOUNTER — Telehealth: Payer: Self-pay | Admitting: Family Medicine

## 2020-05-15 ENCOUNTER — Encounter: Payer: Self-pay | Admitting: Family Medicine

## 2020-05-15 MED ORDER — NYSTATIN 100000 UNIT/ML MT SUSP: 5.0000 mL | Freq: Four times a day (QID) | OROMUCOSAL | 0 refills | Status: DC

## 2020-05-15 MED FILL — NYSTATIN 100,000 UNITS/ML S: 100000 | 3 days supply | Qty: 60 | Fill #0

## 2020-05-15 NOTE — Telephone Encounter (Signed)
Patient was put on antibiotics last week for strep - and now has developed thrush.  Please advise

## 2020-05-15 NOTE — Telephone Encounter (Signed)
Sent my chart message and sent in nystatin swish and swallow for him.  Orland Mustard, MD [facility]

## 2020-05-15 NOTE — Telephone Encounter (Signed)
Pt informed

## 2020-05-15 NOTE — Telephone Encounter (Signed)
Please advise as pt was treated at [facility]

## 2020-05-22 MED FILL — ATOMOXETINE HCL 40 MG CAPS: 40 | 90 days supply | Qty: 180 | Fill #0

## 2020-05-22 MED FILL — TADALAFIL 10 MG TABS: 10 | 10 days supply | Qty: 10 | Fill #0

## 2020-05-22 MED FILL — LORazepam 1 MG TABS: 1 | 30 days supply | Qty: 90 | Fill #0

## 2020-05-22 MED FILL — busPIRone HCL 15 MG TABS: 15 | 90 days supply | Qty: 135 | Fill #0

## 2020-05-22 MED FILL — DIVALPROEX SOD ER 250 MG TA: 250 | 90 days supply | Qty: 270 | Fill #0

## 2020-05-22 MED FILL — VENLAFAXINE HCL ER 75 MG CA: 75 | 90 days supply | Qty: 270 | Fill #0

## 2020-06-22 ENCOUNTER — Other Ambulatory Visit: Payer: Self-pay

## 2020-06-22 ENCOUNTER — Telehealth (INDEPENDENT_AMBULATORY_CARE_PROVIDER_SITE_OTHER): Payer: 59 | Admitting: Psychiatry

## 2020-06-22 ENCOUNTER — Other Ambulatory Visit (HOSPITAL_COMMUNITY): Payer: Self-pay | Admitting: Psychiatry

## 2020-06-22 DIAGNOSIS — F99 Mental disorder, not otherwise specified: Secondary | ICD-10-CM

## 2020-06-22 DIAGNOSIS — F410 Panic disorder [episodic paroxysmal anxiety] without agoraphobia: Secondary | ICD-10-CM | POA: Diagnosis not present

## 2020-06-22 DIAGNOSIS — R41840 Attention and concentration deficit: Secondary | ICD-10-CM

## 2020-06-22 DIAGNOSIS — F3181 Bipolar II disorder: Secondary | ICD-10-CM

## 2020-06-22 DIAGNOSIS — F411 Generalized anxiety disorder: Secondary | ICD-10-CM | POA: Diagnosis not present

## 2020-06-22 DIAGNOSIS — F5105 Insomnia due to other mental disorder: Secondary | ICD-10-CM

## 2020-06-22 MED ORDER — ATOMOXETINE HCL 40 MG PO CAPS: 40.0000 mg | ORAL_CAPSULE | Freq: Two times a day (BID) | ORAL | 0 refills | Status: DC

## 2020-06-22 MED ORDER — DIVALPROEX SODIUM ER 250 MG PO TB24: ORAL_TABLET | ORAL | 0 refills | Status: DC

## 2020-06-22 MED ORDER — VENLAFAXINE HCL ER 75 MG PO CP24: 225.0000 mg | ORAL_CAPSULE | Freq: Every day | ORAL | 0 refills | Status: DC

## 2020-06-22 MED ORDER — BUSPIRONE HCL 15 MG PO TABS: ORAL_TABLET | ORAL | 0 refills | Status: DC

## 2020-06-22 NOTE — Progress Notes (Signed)
Virtual Visit via Video Note  I connected with Matthew [name] on 06/22/20 at  8:00 AM EDT by a video enabled telemedicine application and verified that I am speaking with the correct person using two identifiers.  Location: Patient: home Provider: office   I discussed the limitations of evaluation and management by telemedicine and the availability of in person appointments. The patient expressed understanding and agreed to proceed.  History of Present Illness: [name] shares that he is feeling stressed and overwhelmed. With new management taking over there have been added pressures to his normal workload. [name] is finding that his motivation and energy have dropped significantly. He is feeling depressed. At times he has passive thoughts of death but denies any SI/HI. His anxiety has been high this past week after new expectations were set by his boss. Per new management. He states that he has had a few panic attacks over the last 3 to 5 days. He had stopped taking Ativan but took three 1/2 tablets over the last week. He is not sleeping as well and is waking up after 5 to 6 hours rather than eight as before. [name] is denying any manic or hypomanic like symptoms. He and his wife have noticed a cycle of depression occurring every two and a half months. It seems as though this is lining up with his typical pattern. [name] feels that his medications are effective and does not want meds changed at this time. He does not want to undergo ECT.    Observations/Objective: Psychiatric Specialty Exam:   There were no vitals taken for this visit.There is no height or weight on file to calculate BMI.  General Appearance: Casual and Well Groomed  Eye Contact:  Good  Speech:  Clear and Coherent and Normal Rate  Volume:  Normal  Mood:  Anxious and Depressed  Affect:  Full Range  Thought Process:  Goal Directed, Linear and Descriptions of Associations: Intact  Orientation:  Full (Time, Place, and Person)   Thought Content:  Logical  Suicidal Thoughts:  No  Homicidal Thoughts:  No  Memory:  Immediate;   Good  Judgement:  Good  Insight:  Good  Psychomotor Activity:  Normal  Concentration:  Concentration: Good  Recall:  Good  Fund of Knowledge:  Good  Language:  Good  Akathisia:  No  Handed:  Right  AIMS (if indicated):     Assets:  Communication Skills Desire for Improvement Financial Resources/Insurance Housing Intimacy Leisure Time Resilience Social Support Talents/Skills Transportation Vocational/Educational  ADL's:  Intact  Cognition:  WNL  Sleep:        Assessment and Plan:  1. Bipolar 2 disorder (HCC) - atomoxetine (STRATTERA) 40 MG capsule; Take 1 capsule (40 mg total) by mouth 2 (two) times daily with a meal.  Dispense: 360 capsule; Refill: 0 - divalproex (DEPAKOTE ER) 250 MG 24 hr tablet; Take 1 tab (250mg ) po qAM and 2 tabs (500mg ) po qPM  Dispense: 270 tablet; Refill: 0 - venlafaxine XR (EFFEXOR-XR) 75 MG 24 hr capsule; Take 3 capsules (225 mg total) by mouth daily with breakfast.  Dispense: 270 capsule; Refill: 0  2. Poor concentration - atomoxetine (STRATTERA) 40 MG capsule; Take 1 capsule (40 mg total) by mouth 2 (two) times daily with a meal.  Dispense: 360 capsule; Refill: 0  3. GAD (generalized anxiety disorder) - busPIRone (BUSPAR) 15 MG tablet; 7.5mg  po qAM and 15mg  qPM  Dispense: 135 tablet; Refill: 0 - venlafaxine XR (EFFEXOR-XR) 75 MG 24 hr  capsule; Take 3 capsules (225 mg total) by mouth daily with breakfast.  Dispense: 270 capsule; Refill: 0  4. Panic 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 - venlafaxine XR (EFFEXOR-XR) 75 MG 24 hr capsule; Take 3 capsules (225 mg total) by mouth daily with breakfast.  Dispense: 270 capsule; Refill: 0  5. Insomnia due to other mental disorder  - continue Ativan- no refill today as pt states he has enough  [name]'s plan is to meet with his boss and discuss projects that he feels passionate about. He plans to continue to  delegate work and complete projects that have been assigned. He is going to talk with his boss about communication styles. [name] will continue to engage in self-care.   Follow Up Instructions: In 2-4 weeks or sooner if needed   I discussed the assessment and treatment plan with the patient. The patient was provided an opportunity to ask questions and all were answered. The patient agreed with the plan and demonstrated an understanding of the instructions.   The patient was advised to call back or seek an in-person evaluation if the symptoms worsen or if the condition fails to improve as anticipated.  I provided 30 minutes of non-face-to-face time during this encounter.   [name], MD

## 2020-06-26 ENCOUNTER — Encounter: Payer: Self-pay | Admitting: Family Medicine

## 2020-07-06 ENCOUNTER — Telehealth (HOSPITAL_COMMUNITY): Payer: 59 | Admitting: Psychiatry

## 2020-07-06 ENCOUNTER — Other Ambulatory Visit: Payer: Self-pay

## 2020-07-20 ENCOUNTER — Other Ambulatory Visit: Payer: Self-pay

## 2020-07-20 ENCOUNTER — Other Ambulatory Visit: Payer: Self-pay | Admitting: Psychiatry

## 2020-07-20 ENCOUNTER — Telehealth (HOSPITAL_COMMUNITY): Payer: 59 | Admitting: Psychiatry

## 2020-07-20 ENCOUNTER — Other Ambulatory Visit
Admission: RE | Admit: 2020-07-20 | Discharge: 2020-07-20 | Disposition: A | Discharge status: Home / Self Care | Payer: 59 | Source: Ambulatory Visit | Attending: Psychiatry | Admitting: Psychiatry

## 2020-07-20 DIAGNOSIS — F3181 Bipolar II disorder: Secondary | ICD-10-CM

## 2020-07-20 DIAGNOSIS — Z01812 Encounter for preprocedural laboratory examination: Secondary | ICD-10-CM | POA: Insufficient documentation

## 2020-07-20 DIAGNOSIS — Z20822 Contact with and (suspected) exposure to covid-19: Secondary | ICD-10-CM | POA: Diagnosis not present

## 2020-07-20 DIAGNOSIS — F411 Generalized anxiety disorder: Secondary | ICD-10-CM

## 2020-07-20 LAB — SARS CORONAVIRUS 2 (TAT 6-24 HRS): SARS Coronavirus 2: NEGATIVE

## 2020-07-20 NOTE — Progress Notes (Unsigned)
Very depressed, unmotivated, very high anxiety with panic attacks, withdrawn, isolating, wanting to leave but denies SI/HI. Sleep is 4-5 hrs then napping  Hypomanic- increased spending but not excessively, more irritable and snapping some on/off, grandiose thoughts about changing career  Depression came on and is getting worse so decided to restart EKG. Appointment tomorrow for 1st session  He talked to his boss and that helped some. Still talking to EAP.   ECT Continue meds FMLA 6 weeks (1st on December 3- Jan 13) career coach- Clifton Custard (send text 1st) Refer for therapy  Dec 16 @ 11.30am (20)

## 2020-07-21 ENCOUNTER — Encounter: Payer: Self-pay | Admitting: Anesthesiology

## 2020-07-21 ENCOUNTER — Other Ambulatory Visit: Payer: Self-pay

## 2020-07-21 ENCOUNTER — Encounter
Admission: RE | Admit: 2020-07-21 | Discharge: 2020-07-21 | Disposition: A | Discharge status: Home / Self Care | Payer: 59 | Source: Ambulatory Visit | Attending: Psychiatry | Admitting: Psychiatry

## 2020-07-21 DIAGNOSIS — R454 Irritability and anger: Secondary | ICD-10-CM | POA: Diagnosis not present

## 2020-07-21 DIAGNOSIS — F419 Anxiety disorder, unspecified: Secondary | ICD-10-CM | POA: Insufficient documentation

## 2020-07-21 DIAGNOSIS — F332 Major depressive disorder, recurrent severe without psychotic features: Secondary | ICD-10-CM | POA: Diagnosis not present

## 2020-07-21 DIAGNOSIS — F339 Major depressive disorder, recurrent, unspecified: Secondary | ICD-10-CM | POA: Diagnosis not present

## 2020-07-21 DIAGNOSIS — F17210 Nicotine dependence, cigarettes, uncomplicated: Secondary | ICD-10-CM | POA: Diagnosis not present

## 2020-07-21 DIAGNOSIS — F418 Other specified anxiety disorders: Secondary | ICD-10-CM | POA: Diagnosis not present

## 2020-07-21 MED ORDER — GLYCOPYRROLATE 0.2 MG/ML IJ SOLN
0.3000 mg | Freq: Once | INTRAMUSCULAR | Status: AC
  Administered 2020-07-21: 0.3 mg via INTRAVENOUS

## 2020-07-21 MED ORDER — SODIUM CHLORIDE 0.9 % IV SOLN
500.0000 mL | Freq: Once | INTRAVENOUS | Status: AC
  Administered 2020-07-21: 500 mL via INTRAVENOUS

## 2020-07-21 MED ORDER — GLYCOPYRROLATE 0.2 MG/ML IJ SOLN
INTRAMUSCULAR | Status: AC
  Filled 2020-07-21: qty 2

## 2020-07-21 MED ORDER — SUCCINYLCHOLINE CHLORIDE 20 MG/ML IJ SOLN
INTRAMUSCULAR | Status: DC | PRN
  Administered 2020-07-21: 150 mg via INTRAVENOUS

## 2020-07-21 MED ORDER — MIDAZOLAM HCL 2 MG/2ML IJ SOLN
INTRAMUSCULAR | Status: AC
  Filled 2020-07-21: qty 2

## 2020-07-21 MED ORDER — METHOHEXITAL SODIUM 100 MG/10ML IV SOSY
PREFILLED_SYRINGE | INTRAVENOUS | Status: DC | PRN
  Administered 2020-07-21: 120 mg via INTRAVENOUS
  Administered 2020-07-21: 50 mg via INTRAVENOUS

## 2020-07-21 MED ORDER — MIDAZOLAM HCL 2 MG/2ML IJ SOLN
2.0000 mg | Freq: Once | INTRAMUSCULAR | Status: AC
  Administered 2020-07-21: 2 mg via INTRAVENOUS

## 2020-07-21 MED ORDER — SODIUM CHLORIDE 0.9 % IV SOLN: INTRAVENOUS | Status: DC | PRN

## 2020-07-21 MED ORDER — LABETALOL HCL 5 MG/ML IV SOLN
INTRAVENOUS | Status: DC | PRN
  Administered 2020-07-21: 15 mg via INTRAVENOUS

## 2020-07-21 MED ORDER — SUCCINYLCHOLINE CHLORIDE 200 MG/10ML IV SOSY
PREFILLED_SYRINGE | INTRAVENOUS | Status: AC
  Filled 2020-07-21: qty 10

## 2020-07-21 MED ORDER — KETOROLAC TROMETHAMINE 30 MG/ML IJ SOLN
30.0000 mg | Freq: Once | INTRAMUSCULAR | Status: AC
  Administered 2020-07-21: 30 mg via INTRAVENOUS

## 2020-07-21 MED ORDER — METHOHEXITAL SODIUM 0.5 G IJ SOLR
INTRAMUSCULAR | Status: AC
  Filled 2020-07-21: qty 500

## 2020-07-21 MED ORDER — KETOROLAC TROMETHAMINE 30 MG/ML IJ SOLN
INTRAMUSCULAR | Status: AC
  Filled 2020-07-21: qty 1

## 2020-07-21 NOTE — H&P (Signed)
Matthew [name] is an 49 y.o. male.   Chief Complaint: return of irritable depression Recurrent depression HPI: recurrent depression with anxiety  Past Medical History:  Diagnosis Date  . Acute kidney failure (HCC) 01/2012  . Anxiety   . Bipolar disorder (HCC)    hypomania  . COPD (chronic obstructive pulmonary disease) (HCC)   . Depression   . Low back pain     Past Surgical History:  Procedure Laterality Date  . NO PAST SURGERIES      Family History  Problem Relation Age of Onset  . Anxiety disorder Mother   . Alcohol abuse Father   . Drug abuse Father   . Bipolar disorder Sister   . Alcohol abuse Brother   . Drug abuse Brother   . Bipolar disorder Paternal Aunt   . Schizophrenia Paternal Aunt   . Cancer Maternal Grandmother        lung  . Hypertension Maternal Grandfather   . Cancer Paternal Grandmother        lung  . Hypertension Paternal Grandfather   . Diabetes Neg Hx   . Heart disease Neg Hx   . Hyperlipidemia Neg Hx   . Stroke Neg Hx   . Suicidality Neg Hx    Social History:  reports that he has been smoking cigarettes. He has a 20.00 pack-year smoking history. He has never used smokeless tobacco. He reports that he does not drink alcohol and does not use drugs.  Allergies:  Allergies  Allergen Reactions  . Ciprofloxacin     Acute kidney failure    (Not in a hospital admission)   Results for orders placed or performed during the hospital encounter of 07/20/20 (from the past 48 hour(s))  SARS CORONAVIRUS 2 (TAT 6-24 HRS)     Status: None   Collection Time: 07/20/20  1:52 PM  Result Value Ref Range   SARS Coronavirus 2 NEGATIVE NEGATIVE    Comment: (NOTE) SARS-CoV-2 target nucleic acids are NOT DETECTED.  The SARS-CoV-2 RNA is generally detectable in upper and lower respiratory specimens during the acute phase of infection. Negative results do not preclude SARS-CoV-2 infection, do not rule out co-infections with other pathogens, and should  not be used as the sole basis for treatment or other patient management decisions. Negative results must be combined with clinical observations, patient history, and epidemiological information. The expected result is Negative.  Fact Sheet for Patients: HairSlick.no  Fact Sheet for Healthcare Providers: quierodirigir.com  This test is not yet approved or cleared by the [place] FDA and  has been authorized for detection and/or diagnosis of SARS-CoV-2 by FDA under an Emergency Use Authorization (EUA). This EUA will remain  in effect (meaning this test can be used) for the duration of the COVID-19 declaration under Se ction 564(b)(1) of the Act, 21 U.S.C. section 360bbb-3(b)(1), unless the authorization is terminated or revoked sooner.  Performed at  [facility], [address], [place], [place] [address]    No results found.  Review of Systems  Constitutional: Negative.   HENT: Negative.   Eyes: Negative.   Respiratory: Negative.   Cardiovascular: Negative.   Gastrointestinal: Negative.   Musculoskeletal: Negative.   Skin: Negative.   Neurological: Negative.   Psychiatric/Behavioral: Positive for dysphoric mood. The patient is nervous/anxious.     Blood pressure 115/77, pulse 75, temperature 98.1 F (36.7 C), temperature source Oral, resp. rate 18, height 6\' 5"  (1.956 m), weight 90.7 kg, SpO2 98 %. Physical Exam  Vitals and nursing note reviewed.  Constitutional:      Appearance: He is well-developed.  HENT:     Head: Normocephalic and atraumatic.  Eyes:     Conjunctiva/sclera: Conjunctivae normal.     Pupils: Pupils are equal, round, and reactive to light.  Cardiovascular:     Heart sounds: Normal heart sounds.  Pulmonary:     Effort: Pulmonary effort is normal.  Abdominal:     Palpations: Abdomen is soft.  Musculoskeletal:        General: Normal range of motion.     Cervical back: Normal range of  motion.  Skin:    General: Skin is warm and dry.  Neurological:     General: No focal deficit present.     Mental Status: He is alert.  Psychiatric:        Attention and Perception: Attention normal.        Mood and Affect: Mood is depressed. Affect is angry.        Speech: Speech normal.        Behavior: Behavior is agitated. Behavior is not aggressive or hyperactive.        Thought Content: Thought content is not paranoid. Thought content includes suicidal ideation. Thought content does not include homicidal ideation. Thought content does not include suicidal plan.        Cognition and Memory: Cognition normal.        Judgment: Judgment normal.      Assessment/Plan Brief repeat of index treatment planned  [name], MD 07/21/2020, 12:20 PM

## 2020-07-21 NOTE — Anesthesia Procedure Notes (Signed)
Procedure Name: General with mask airway Date/Time: 07/21/2020 12:50 PM Performed by: Hezzie Bump, CRNA Pre-anesthesia Checklist: Patient identified, Emergency Drugs available, Suction available and Patient being monitored Patient Re-evaluated:Patient Re-evaluated prior to induction Oxygen Delivery Method: Circle system utilized Preoxygenation: Pre-oxygenation with 100% oxygen Induction Type: IV induction Ventilation: Mask ventilation without difficulty and Mask ventilation throughout procedure Airway Equipment and Method: Bite block Placement Confirmation: positive ETCO2 Dental Injury: Teeth and Oropharynx as per pre-operative assessment

## 2020-07-21 NOTE — Anesthesia Preprocedure Evaluation (Signed)
Anesthesia Evaluation  Patient identified by MRN, date of birth, ID band Patient awake    Reviewed: Allergy & Precautions, NPO status , Patient's Chart, lab work & pertinent test results  History of Anesthesia Complications Negative for: history of anesthetic complications  Airway Mallampati: III  TM Distance: >3 FB Neck ROM: Full    Dental no notable dental hx.    Pulmonary asthma , neg sleep apnea, COPD,  COPD inhaler, Current Smoker and Patient abstained from smoking.,    breath sounds clear to auscultation- rhonchi (-) wheezing      Cardiovascular Exercise Tolerance: Good (-) hypertension(-) CAD, (-) Past MI, (-) Cardiac Stents and (-) CABG  Rhythm:Regular Rate:Normal - Systolic murmurs and - Diastolic murmurs    Neuro/Psych neg Seizures PSYCHIATRIC DISORDERS Anxiety Depression Bipolar Disorder negative neurological ROS     GI/Hepatic negative GI ROS, Neg liver ROS,   Endo/Other  negative endocrine ROSneg diabetes  Renal/GU negative Renal ROS     Musculoskeletal negative musculoskeletal ROS (+)   Abdominal (+) - obese,   Peds  Hematology negative hematology ROS (+)   Anesthesia Other Findings Past Medical History: 01/2012: Acute kidney failure (HCC) No date: Anxiety No date: Bipolar disorder (HCC)     Comment:  hypomania No date: COPD (chronic obstructive pulmonary disease) (HCC) No date: Depression No date: Low back pain   Reproductive/Obstetrics                             Anesthesia Physical  Anesthesia Plan  ASA: II  Anesthesia Plan: General   Post-op Pain Management:    Induction: Intravenous  PONV Risk Score and Plan: 0 and Ondansetron  Airway Management Planned: Mask  Additional Equipment:   Intra-op Plan:   Post-operative Plan:   Informed Consent: I have reviewed the patients History and Physical, chart, labs and discussed the procedure including the risks,  benefits and alternatives for the proposed anesthesia with the patient or authorized representative who has indicated his/her understanding and acceptance.     Dental advisory given  Plan Discussed with: CRNA and Anesthesiologist  Anesthesia Plan Comments:         Anesthesia Quick Evaluation  

## 2020-07-21 NOTE — Procedures (Signed)
ECT SERVICES Physician's Interval Evaluation & Treatment Note  Patient Identification: Matthew [name] MRN:  [id number] Date of Evaluation:  07/21/2020 TX #: 1 in the series  MADRS:   MMSE:   P.E. Findings:  Unremarkable physical exam  Psychiatric Interval Note:  Return of significant depression with irritability anxiety poor functioning.  No psychosis.  Subjective:  Patient is a 49 y.o. male seen for evaluation for Electroconvulsive Therapy. "I do not feel good"  Treatment Summary:   [x]   Right Unilateral             []  Bilateral   % Energy : 0.3 ms 50%   Impedance: 1530 ohms  Seizure Energy Index: 6336 V squared  Postictal Suppression Index: 65%  Seizure Concordance Index: 89%  Medications  Pre Shock: Robinul 0.3 mg Toradol 30 mg Brevital 120 mg with an additional 50 mg because of continued movement, succinylcholine 160 mg labetalol 10 mg  Post Shock: Versed 2 mg  Seizure Duration: 52 seconds EMG 79 seconds EEG   Comments: Patient tolerated seizure well.  Plan is to continue 3 times a week for several treatments seeking some improvement such as he had last time.  Lungs:  [x]   Clear to auscultation               []  Other:   Heart:    [x]   Regular rhythm             []  irregular rhythm    [x]   Previous H&P reviewed, patient examined and there are NO CHANGES                 []   Previous H&P reviewed, patient examined and there are changes noted.   , MD 12/3/20216:06 PM

## 2020-07-21 NOTE — Transfer of Care (Signed)
Immediate Anesthesia Transfer of Care Note  Patient: Matthew [name]  Procedure(s) Performed: ECT TX  Patient Location: PACU  Anesthesia Type:General  Level of Consciousness: drowsy  Airway & Oxygen Therapy: Patient Spontanous Breathing and Patient connected to face mask oxygen  Post-op Assessment: Report given to RN and Post -op Vital signs reviewed and stable  Post vital signs: Reviewed and stable  Last Vitals:  Vitals Value Taken Time  BP 125/81 07/21/20 1309  Temp    Pulse 101 07/21/20 1309  Resp 19 07/21/20 1309  SpO2 98 % 07/21/20 1309  Vitals shown include unvalidated device data.  Last Pain:  Vitals:   07/21/20 1101  TempSrc:   PainSc: 0-No pain         Complications: No complications documented.

## 2020-07-23 LAB — SARS CORONAVIRUS 2 (TAT 6-24 HRS): SARS Coronavirus 2: NEGATIVE

## 2020-07-23 NOTE — Anesthesia Postprocedure Evaluation (Signed)
Anesthesia Post Note  Patient: Matthew [name]  Procedure(s) Performed: ECT TX  Patient location during evaluation: PACU Anesthesia Type: General Level of consciousness: awake and alert Pain management: pain level controlled Vital Signs Assessment: post-procedure vital signs reviewed and stable Respiratory status: spontaneous breathing, nonlabored ventilation, respiratory function stable and patient connected to nasal cannula oxygen Cardiovascular status: blood pressure returned to baseline and stable Postop Assessment: no apparent nausea or vomiting Anesthetic complications: no   No complications documented.   Last Vitals:  Vitals:   07/21/20 1320 07/21/20 1330  BP: 118/79 117/80  Pulse: 82 82  Resp: 17 18  Temp:  37.1 C  SpO2: 100%     Last Pain:  Vitals:   07/21/20 1330  TempSrc: Oral  PainSc: 0-No pain                 Lenard Simmer

## 2020-07-24 ENCOUNTER — Other Ambulatory Visit: Payer: Self-pay | Admitting: Psychiatry

## 2020-07-24 ENCOUNTER — Encounter (HOSPITAL_BASED_OUTPATIENT_CLINIC_OR_DEPARTMENT_OTHER)
Admission: RE | Admit: 2020-07-24 | Discharge: 2020-07-24 | Disposition: A | Discharge status: Home / Self Care | Payer: 59 | Source: Ambulatory Visit | Attending: Psychiatry | Admitting: Psychiatry

## 2020-07-24 ENCOUNTER — Encounter: Payer: Self-pay | Admitting: Registered Nurse

## 2020-07-24 ENCOUNTER — Other Ambulatory Visit: Payer: Self-pay

## 2020-07-24 DIAGNOSIS — F332 Major depressive disorder, recurrent severe without psychotic features: Secondary | ICD-10-CM | POA: Diagnosis not present

## 2020-07-24 DIAGNOSIS — F419 Anxiety disorder, unspecified: Secondary | ICD-10-CM | POA: Diagnosis not present

## 2020-07-24 DIAGNOSIS — F339 Major depressive disorder, recurrent, unspecified: Secondary | ICD-10-CM | POA: Diagnosis not present

## 2020-07-24 DIAGNOSIS — F17210 Nicotine dependence, cigarettes, uncomplicated: Secondary | ICD-10-CM | POA: Diagnosis not present

## 2020-07-24 DIAGNOSIS — F418 Other specified anxiety disorders: Secondary | ICD-10-CM | POA: Diagnosis not present

## 2020-07-24 DIAGNOSIS — R454 Irritability and anger: Secondary | ICD-10-CM | POA: Diagnosis not present

## 2020-07-24 MED ORDER — METHOHEXITAL SODIUM 100 MG/10ML IV SOSY
PREFILLED_SYRINGE | INTRAVENOUS | Status: DC | PRN
  Administered 2020-07-24: 170 mg via INTRAVENOUS

## 2020-07-24 MED ORDER — FENTANYL CITRATE (PF) 100 MCG/2ML IJ SOLN: 25.0000 ug | INTRAMUSCULAR | Status: DC | PRN

## 2020-07-24 MED ORDER — SUCCINYLCHOLINE CHLORIDE 200 MG/10ML IV SOSY
PREFILLED_SYRINGE | INTRAVENOUS | Status: AC
  Filled 2020-07-24: qty 10

## 2020-07-24 MED ORDER — MIDAZOLAM HCL 2 MG/2ML IJ SOLN
INTRAMUSCULAR | Status: DC | PRN
  Administered 2020-07-24: 2 mg via INTRAVENOUS

## 2020-07-24 MED ORDER — LABETALOL HCL 5 MG/ML IV SOLN
INTRAVENOUS | Status: DC | PRN
  Administered 2020-07-24: 15 mg via INTRAVENOUS

## 2020-07-24 MED ORDER — SODIUM CHLORIDE 0.9 % IV SOLN
500.0000 mL | Freq: Once | INTRAVENOUS | Status: AC
  Administered 2020-07-24: 500 mL via INTRAVENOUS

## 2020-07-24 MED ORDER — LIDOCAINE HCL (PF) 2 % IJ SOLN
INTRAMUSCULAR | Status: AC
  Filled 2020-07-24: qty 10

## 2020-07-24 MED ORDER — GLYCOPYRROLATE 0.2 MG/ML IJ SOLN: 0.3000 mg | Freq: Once | INTRAMUSCULAR | Status: DC

## 2020-07-24 MED ORDER — MIDAZOLAM HCL 2 MG/2ML IJ SOLN: 2.0000 mg | Freq: Once | INTRAMUSCULAR | Status: DC

## 2020-07-24 MED ORDER — ONDANSETRON HCL 4 MG/2ML IJ SOLN: 4.0000 mg | Freq: Once | INTRAMUSCULAR | Status: DC | PRN

## 2020-07-24 MED ORDER — GLYCOPYRROLATE 0.2 MG/ML IJ SOLN
INTRAMUSCULAR | Status: AC
  Administered 2020-07-24: 0.3 mg via INTRAVENOUS
  Filled 2020-07-24: qty 2

## 2020-07-24 MED ORDER — SODIUM CHLORIDE FLUSH 0.9 % IV SOLN
INTRAVENOUS | Status: AC
  Filled 2020-07-24: qty 30

## 2020-07-24 MED ORDER — LABETALOL HCL 5 MG/ML IV SOLN
INTRAVENOUS | Status: AC
  Filled 2020-07-24: qty 4

## 2020-07-24 MED ORDER — SODIUM CHLORIDE 0.9 % IV SOLN: 500.0000 mL | Freq: Once | INTRAVENOUS | Status: AC

## 2020-07-24 MED ORDER — KETOROLAC TROMETHAMINE 30 MG/ML IJ SOLN: 30.0000 mg | Freq: Once | INTRAMUSCULAR | Status: AC

## 2020-07-24 MED ORDER — KETOROLAC TROMETHAMINE 30 MG/ML IJ SOLN
INTRAMUSCULAR | Status: AC
  Administered 2020-07-24: 30 mg via INTRAVENOUS
  Filled 2020-07-24: qty 1

## 2020-07-24 MED ORDER — GLYCOPYRROLATE 0.2 MG/ML IJ SOLN: 0.3000 mg | Freq: Once | INTRAMUSCULAR | Status: AC

## 2020-07-24 MED ORDER — LIDOCAINE HCL (CARDIAC) PF 100 MG/5ML IV SOSY
PREFILLED_SYRINGE | INTRAVENOUS | Status: DC | PRN
  Administered 2020-07-24: 100 mg via INTRAVENOUS

## 2020-07-24 MED ORDER — KETOROLAC TROMETHAMINE 30 MG/ML IJ SOLN: 30.0000 mg | Freq: Once | INTRAMUSCULAR | Status: DC

## 2020-07-24 MED ORDER — MIDAZOLAM HCL 2 MG/2ML IJ SOLN
INTRAMUSCULAR | Status: AC
  Filled 2020-07-24: qty 2

## 2020-07-24 MED ORDER — SUCCINYLCHOLINE CHLORIDE 20 MG/ML IJ SOLN
INTRAMUSCULAR | Status: DC | PRN
  Administered 2020-07-24: 150 mg via INTRAVENOUS

## 2020-07-24 NOTE — Anesthesia Preprocedure Evaluation (Signed)
Anesthesia Evaluation  Patient identified by MRN, date of birth, ID band Patient awake    Reviewed: Allergy & Precautions, NPO status , Patient's Chart, lab work & pertinent test results  History of Anesthesia Complications Negative for: history of anesthetic complications  Airway Mallampati: III  TM Distance: >3 FB Neck ROM: Full    Dental no notable dental hx.    Pulmonary asthma , neg sleep apnea, COPD,  COPD inhaler, Current Smoker and Patient abstained from smoking.,    breath sounds clear to auscultation- rhonchi (-) wheezing      Cardiovascular Exercise Tolerance: Good (-) hypertension(-) CAD, (-) Past MI, (-) Cardiac Stents and (-) CABG  Rhythm:Regular Rate:Normal - Systolic murmurs and - Diastolic murmurs    Neuro/Psych neg Seizures PSYCHIATRIC DISORDERS Anxiety Depression Bipolar Disorder negative neurological ROS     GI/Hepatic negative GI ROS, Neg liver ROS,   Endo/Other  negative endocrine ROSneg diabetes  Renal/GU negative Renal ROS     Musculoskeletal negative musculoskeletal ROS (+)   Abdominal (+) - obese,   Peds  Hematology negative hematology ROS (+)   Anesthesia Other Findings Past Medical History: 01/2012: Acute kidney failure (HCC) No date: Anxiety No date: Bipolar disorder (HCC)     Comment:  hypomania No date: COPD (chronic obstructive pulmonary disease) (HCC) No date: Depression No date: Low back pain   Reproductive/Obstetrics                             Anesthesia Physical  Anesthesia Plan  ASA: II  Anesthesia Plan: General   Post-op Pain Management:    Induction: Intravenous  PONV Risk Score and Plan: 0 and Ondansetron  Airway Management Planned: Mask  Additional Equipment:   Intra-op Plan:   Post-operative Plan:   Informed Consent: I have reviewed the patients History and Physical, chart, labs and discussed the procedure including the risks,  benefits and alternatives for the proposed anesthesia with the patient or authorized representative who has indicated his/her understanding and acceptance.     Dental advisory given  Plan Discussed with: CRNA and Anesthesiologist  Anesthesia Plan Comments:         Anesthesia Quick Evaluation

## 2020-07-24 NOTE — H&P (Signed)
Matthew [name] is an 49 y.o. male.   Chief Complaint: aches. depressed HPI: recurrent depression  Past Medical History:  Diagnosis Date  . Acute kidney failure (HCC) 01/2012  . Anxiety   . Bipolar disorder (HCC)    hypomania  . COPD (chronic obstructive pulmonary disease) (HCC)   . Depression   . Low back pain     Past Surgical History:  Procedure Laterality Date  . NO PAST SURGERIES      Family History  Problem Relation Age of Onset  . Anxiety disorder Mother   . Alcohol abuse Father   . Drug abuse Father   . Bipolar disorder Sister   . Alcohol abuse Brother   . Drug abuse Brother   . Bipolar disorder Paternal Aunt   . Schizophrenia Paternal Aunt   . Cancer Maternal Grandmother        lung  . Hypertension Maternal Grandfather   . Cancer Paternal Grandmother        lung  . Hypertension Paternal Grandfather   . Diabetes Neg Hx   . Heart disease Neg Hx   . Hyperlipidemia Neg Hx   . Stroke Neg Hx   . Suicidality Neg Hx    Social History:  reports that he has been smoking cigarettes. He has a 20.00 pack-year smoking history. He has never used smokeless tobacco. He reports that he does not drink alcohol and does not use drugs.  Allergies:  Allergies  Allergen Reactions  . Ciprofloxacin     Acute kidney failure    (Not in a hospital admission)   No results found for this or any previous visit (from the past 48 hour(s)). No results found.  Review of Systems  Constitutional: Negative.   HENT: Negative.   Eyes: Negative.   Respiratory: Negative.   Cardiovascular: Negative.   Gastrointestinal: Negative.   Musculoskeletal: Negative.   Skin: Negative.   Neurological: Negative.   Psychiatric/Behavioral: Negative.     Blood pressure 128/89, pulse 75, temperature 98.7 F (37.1 C), temperature source Oral, resp. rate 18, height 6\' 5"  (1.956 m), weight 90.7 kg, SpO2 98 %. Physical Exam Vitals and nursing note reviewed.  Constitutional:      Appearance:  He is well-developed.  HENT:     Head: Normocephalic and atraumatic.  Eyes:     Conjunctiva/sclera: Conjunctivae normal.     Pupils: Pupils are equal, round, and reactive to light.  Cardiovascular:     Heart sounds: Normal heart sounds.  Pulmonary:     Effort: Pulmonary effort is normal.  Abdominal:     Palpations: Abdomen is soft.  Musculoskeletal:        General: Normal range of motion.     Cervical back: Normal range of motion.  Skin:    General: Skin is warm and dry.  Neurological:     General: No focal deficit present.     Mental Status: He is alert.  Psychiatric:        Attention and Perception: Attention normal.        Mood and Affect: Mood is depressed.        Speech: Speech normal.        Behavior: Behavior normal.        Thought Content: Thought content normal.        Cognition and Memory: Cognition normal.        Judgment: Judgment normal.      Assessment/Plan Treatment today and Wednesday at least  Tuesday,  MD 07/24/2020, 12:22 PM

## 2020-07-24 NOTE — Procedures (Signed)
ECT SERVICES Physician's Interval Evaluation & Treatment Note  Patient Identification: Matthew [name] MRN:  [id number] Date of Evaluation:  07/24/2020 TX #: 2  MADRS:   MMSE:   P.E. Findings:  No change to physical exam  Psychiatric Interval Note:  Affect seems much brighter and more appropriate.  Mood reported as better  Subjective:  Patient is a 49 y.o. male seen for evaluation for Electroconvulsive Therapy. Clearly feeling better  Treatment Summary:   [x]   Right Unilateral             []  Bilateral   % Energy : 0.3 ms 50%   Impedance: 1480 ohms  Seizure Energy Index: 5383 V squared  Postictal Suppression Index: 89%  Seizure Concordance Index: 88%  Medications  Pre Shock: Robinul 0.3 mg Toradol 30 mg Brevital 170 mg succinylcholine 150 mg labetalol 10 mg  Post Shock: Versed 2 mg  Seizure Duration: EMG 24 seconds EEG 45 seconds   Comments: We will see him again on Wednesday  Lungs:  [x]   Clear to auscultation               []  Other:   Heart:    [x]   Regular rhythm             []  irregular rhythm    [x]   Previous H&P reviewed, patient examined and there are NO CHANGES                 []   Previous H&P reviewed, patient examined and there are changes noted.   , MD 12/6/20216:14 PM

## 2020-07-24 NOTE — Anesthesia Procedure Notes (Signed)
Performed by: Stormy Fabian, CRNA Pre-anesthesia Checklist: Patient identified, Patient being monitored, Timeout performed, Emergency Drugs available and Suction available Patient Re-evaluated:Patient Re-evaluated prior to induction Oxygen Delivery Method: Circle system utilized Preoxygenation: Pre-oxygenation with 100% oxygen Induction Type: IV induction Ventilation: Mask ventilation without difficulty and Mask ventilation throughout procedure Grade View: Grade II Tube type: Oral Tube size: 7.0 mm Number of attempts: 1 Airway Equipment and Method: Stylet Placement Confirmation: ETT inserted through vocal cords under direct vision,  positive ETCO2 and breath sounds checked- equal and bilateral Secured at: 21 cm Tube secured with: Tape Dental Injury: Teeth and Oropharynx as per pre-operative assessment

## 2020-07-24 NOTE — Transfer of Care (Signed)
Immediate Anesthesia Transfer of Care Note  Patient: Matthew [name]  Procedure(s) Performed: ECT TX  Patient Location: PACU  Anesthesia Type:General  Level of Consciousness: sedated  Airway & Oxygen Therapy: Patient Spontanous Breathing and Patient connected to face mask oxygen  Post-op Assessment: Report given to RN and Post -op Vital signs reviewed and stable  Post vital signs: Reviewed  Last Vitals:  Vitals Value Taken Time  BP 144/96 07/24/20 1309  Temp 37.2 C 07/24/20 1307  Pulse 86 07/24/20 1310  Resp 14 07/24/20 1310  SpO2 100 % 07/24/20 1310  Vitals shown include unvalidated device data.  Last Pain:  Vitals:   07/24/20 1307  TempSrc:   PainSc: Asleep         Complications: No complications documented.

## 2020-07-25 ENCOUNTER — Other Ambulatory Visit: Payer: Self-pay | Admitting: Psychiatry

## 2020-07-25 NOTE — Anesthesia Postprocedure Evaluation (Signed)
Anesthesia Post Note  Patient: Matthew [name]  Procedure(s) Performed: ECT TX  Patient location during evaluation: PACU Anesthesia Type: General Level of consciousness: awake and alert and oriented Pain management: pain level controlled Vital Signs Assessment: post-procedure vital signs reviewed and stable Respiratory status: spontaneous breathing Cardiovascular status: blood pressure returned to baseline Anesthetic complications: no   No complications documented.   Last Vitals:  Vitals:   07/24/20 1327 07/24/20 1408  BP: 100/68 102/67  Pulse: 81 81  Resp: 15 15  Temp: 36.9 C 36.8 C  SpO2: 96%     Last Pain:  Vitals:   07/24/20 1408  TempSrc: Oral  PainSc: 0-No pain                 Shaquella Stamant

## 2020-07-26 ENCOUNTER — Other Ambulatory Visit: Payer: Self-pay

## 2020-07-26 ENCOUNTER — Encounter
Admission: RE | Admit: 2020-07-26 | Discharge: 2020-07-26 | Disposition: A | Discharge status: Home / Self Care | Payer: 59 | Source: Ambulatory Visit | Attending: Psychiatry | Admitting: Psychiatry

## 2020-07-26 ENCOUNTER — Encounter: Payer: Self-pay | Admitting: Certified Registered Nurse Anesthetist

## 2020-07-26 ENCOUNTER — Telehealth (HOSPITAL_COMMUNITY): Payer: Self-pay | Admitting: *Deleted

## 2020-07-26 DIAGNOSIS — R454 Irritability and anger: Secondary | ICD-10-CM | POA: Diagnosis not present

## 2020-07-26 DIAGNOSIS — F419 Anxiety disorder, unspecified: Secondary | ICD-10-CM | POA: Diagnosis not present

## 2020-07-26 DIAGNOSIS — F339 Major depressive disorder, recurrent, unspecified: Secondary | ICD-10-CM | POA: Diagnosis not present

## 2020-07-26 DIAGNOSIS — N179 Acute kidney failure, unspecified: Secondary | ICD-10-CM | POA: Diagnosis not present

## 2020-07-26 DIAGNOSIS — F418 Other specified anxiety disorders: Secondary | ICD-10-CM | POA: Diagnosis not present

## 2020-07-26 DIAGNOSIS — F17210 Nicotine dependence, cigarettes, uncomplicated: Secondary | ICD-10-CM | POA: Diagnosis not present

## 2020-07-26 DIAGNOSIS — J449 Chronic obstructive pulmonary disease, unspecified: Secondary | ICD-10-CM | POA: Diagnosis not present

## 2020-07-26 NOTE — Telephone Encounter (Signed)
Yes he is. Thank you.

## 2020-07-26 NOTE — Telephone Encounter (Signed)
FMLA paperwork sent to you. Please review, sign, and return. Thanks.

## 2020-07-26 NOTE — Telephone Encounter (Signed)
This is not my patient.  I think Dr. Denese Killings is seeing him.  You can remind Dr. Michae Kava for tomorrow and she is scheduled to see patients.

## 2020-07-26 NOTE — Anesthesia Preprocedure Evaluation (Signed)
Anesthesia Evaluation  Patient identified by MRN, date of birth, ID band Patient awake    Reviewed: Allergy & Precautions, H&P , NPO status , Patient's Chart, lab work & pertinent test results, reviewed documented beta blocker date and time   Airway Mallampati: II   Neck ROM: full    Dental  (+) Poor Dentition   Pulmonary asthma , COPD, Current Smoker,    Pulmonary exam normal        Cardiovascular Exercise Tolerance: Poor negative cardio ROS Normal cardiovascular exam Rhythm:regular Rate:Normal     Neuro/Psych PSYCHIATRIC DISORDERS Anxiety Depression Bipolar Disorder negative neurological ROS     GI/Hepatic negative GI ROS, Neg liver ROS,   Endo/Other  negative endocrine ROS  Renal/GU Renal disease  negative genitourinary   Musculoskeletal   Abdominal   Peds  Hematology negative hematology ROS (+)   Anesthesia Other Findings Past Medical History: 01/2012: Acute kidney failure (HCC) No date: Anxiety No date: Bipolar disorder (HCC)     Comment:  hypomania No date: COPD (chronic obstructive pulmonary disease) (HCC) No date: Depression No date: Low back pain Past Surgical History: No date: NO PAST SURGERIES   Reproductive/Obstetrics negative OB ROS                             Anesthesia Physical Anesthesia Plan  ASA: III  Anesthesia Plan: General   Post-op Pain Management:    Induction:   PONV Risk Score and Plan:   Airway Management Planned:   Additional Equipment:   Intra-op Plan:   Post-operative Plan:   Informed Consent: I have reviewed the patients History and Physical, chart, labs and discussed the procedure including the risks, benefits and alternatives for the proposed anesthesia with the patient or authorized representative who has indicated his/her understanding and acceptance.     Dental Advisory Given  Plan Discussed with: CRNA  Anesthesia Plan  Comments:         Anesthesia Quick Evaluation

## 2020-08-02 DIAGNOSIS — H66003 Acute suppurative otitis media without spontaneous rupture of ear drum, bilateral: Secondary | ICD-10-CM | POA: Diagnosis not present

## 2020-08-03 ENCOUNTER — Other Ambulatory Visit: Payer: Self-pay

## 2020-08-03 ENCOUNTER — Telehealth (HOSPITAL_COMMUNITY): Payer: 59 | Admitting: Psychiatry

## 2020-08-03 DIAGNOSIS — F411 Generalized anxiety disorder: Secondary | ICD-10-CM

## 2020-08-03 DIAGNOSIS — F3181 Bipolar II disorder: Secondary | ICD-10-CM

## 2020-08-03 DIAGNOSIS — F410 Panic disorder [episodic paroxysmal anxiety] without agoraphobia: Secondary | ICD-10-CM

## 2020-08-03 NOTE — Progress Notes (Unsigned)
Virtual Visit via Telephone Note  I connected with Matthew [name] on 08/03/20 at 11:30 AM EST by telephone and verified that I am speaking with the correct person using two identifiers.  Location: Patient: home Provider: office   I discussed the limitations, risks, security and privacy concerns of performing an evaluation and management service by telephone and the availability of in person appointments. I also discussed with the patient that there may be a patient responsible charge related to this service. The patient expressed understanding and agreed to proceed.   History of Present Illness: Matthew [name] shares that he felt significantly better after 2 ECT treatments. He will be going for [facility] ECT about 2 times a year. His depression is better. He is active and keeping busy. Matthew [name] no longer has a desire to run away. He denies SI/HI. Matthew [name] denies panic attacks. He has some anxiety especially regarding taking time off. He might be ready to go back earlier than 6 weeks but he is not sure yet.    Observations/Objective:  General Appearance: unable to assess  Eye Contact:  unable to assess  Speech:  {Speech:22685}  Volume:  {Volume (PAA):22686}  Mood:  {BHH MOOD:22306}  Affect:  {Affect (PAA):22687}  Thought Process:  {Thought Process (PAA):22688}  Orientation:  {BHH ORIENTATION (PAA):22689}  Thought Content:  {Thought Content:22690}  Suicidal Thoughts:  {ST/HT (PAA):22692}  Homicidal Thoughts:  {ST/HT (PAA):22692}  Memory:  {BHH MEMORY:22881}  Judgement:  {Judgement (PAA):22694}  Insight:  {Insight (PAA):22695}  Psychomotor Activity: unable to assess  Concentration:  {Concentration:21399}  Recall:  {BHH GOOD/FAIR/POOR:22877}  Fund of Knowledge:  {BHH GOOD/FAIR/POOR:22877}  Language:  {BHH GOOD/FAIR/POOR:22877}  Akathisia:  unable to assess  Handed:  {Handed:22697}  AIMS (if indicated):     Assets:  {Assets (PAA):22698}  ADL's:  unable to assess  Cognition:  {chl bhh  cognition:304700322}  Sleep:         Assessment and Plan: 1. Bipolar 2 disorder, major depressive episode (HCC)  2. GAD (generalized anxiety disorder)  3. Panic 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   Continue meds at current doses Strattera 40mg  BID Buspar 7.5mg  qAM and 15mg  qPM Depakote 250mg  qAm and 500mg  qPM Ativan 1mg  po TID prn anxiety and panic attacks Effexor XR 225mg  qD  Follow Up Instructions: In 2-3 weeks to determine if ready for return to work   I discussed the assessment and treatment plan with the patient. The patient was provided an opportunity to ask questions and all were answered. The patient agreed with the plan and demonstrated an understanding of the instructions.   The patient was advised to call back or seek an in-person evaluation if the symptoms worsen or if the condition fails to improve as anticipated.  I provided 10 minutes of non-face-to-face time during this encounter.   , MD

## 2020-08-17 ENCOUNTER — Other Ambulatory Visit: Payer: Self-pay

## 2020-08-17 ENCOUNTER — Telehealth (HOSPITAL_COMMUNITY): Payer: 59 | Admitting: Psychiatry

## 2020-08-17 DIAGNOSIS — F410 Panic disorder [episodic paroxysmal anxiety] without agoraphobia: Secondary | ICD-10-CM

## 2020-08-17 DIAGNOSIS — F411 Generalized anxiety disorder: Secondary | ICD-10-CM

## 2020-08-17 DIAGNOSIS — F5105 Insomnia due to other mental disorder: Secondary | ICD-10-CM

## 2020-08-17 DIAGNOSIS — R41840 Attention and concentration deficit: Secondary | ICD-10-CM

## 2020-08-17 DIAGNOSIS — F3181 Bipolar II disorder: Secondary | ICD-10-CM

## 2020-08-17 NOTE — Progress Notes (Unsigned)
Virtual Visit via Video Note  I connected with Matthew [name] on 08/17/20 at  9:30 AM EST by a video enabled telemedicine application and verified that I am speaking with the correct person using two identifiers.  Location: Patient: home Provider: office   I discussed the limitations of evaluation and management by telemedicine and the availability of in person appointments. The patient expressed understanding and agreed to proceed.  History of Present Illness: *** Overall [name] continues to do well. Depression remains stable. Anxiety is manageable and mostly centered on returning to work. He is active and engaged at home. He is sleeping well. [name] is able to focus and complete tasks. He denies SI/HI. [name] plans to use the next few weeks to find ways to improve his work stress.    Observations/Objective: Psychiatric Specialty Exam: ROS  There were no vitals taken for this visit.There is no height or weight on file to calculate BMI.  General Appearance: {Appearance:22683}  Eye Contact:  {BHH EYE CONTACT:22684}  Speech:  {Speech:22685}  Volume:  {Volume (PAA):22686}  Mood:  {BHH MOOD:22306}  Affect:  {Affect (PAA):22687}  Thought Process:  {Thought Process (PAA):22688}  Orientation:  {BHH ORIENTATION (PAA):22689}  Thought Content:  {Thought Content:22690}  Suicidal Thoughts:  {ST/HT (PAA):22692}  Homicidal Thoughts:  {ST/HT (PAA):22692}  Memory:  {BHH MEMORY:22881}  Judgement:  {Judgement (PAA):22694}  Insight:  {Insight (PAA):22695}  Psychomotor Activity:  {Psychomotor (PAA):22696}  Concentration:  {Concentration:21399}  Recall:  {BHH GOOD/FAIR/POOR:22877}  Fund of Knowledge:  {BHH GOOD/FAIR/POOR:22877}  Language:  {BHH GOOD/FAIR/POOR:22877}  Akathisia:  {BHH YES OR NO:22294}  Handed:  {Handed:22697}  AIMS (if indicated):     Assets:  {Assets (PAA):22698}  ADL's:  {BHH VQQ'V:95638}  Cognition:  {chl bhh cognition:304700322}  Sleep:        Assessment and Plan: 1. GAD  (generalized anxiety disorder) Continue Buspar 7.5mg  po qAM and 15mg  po qPM Effexor XR 225mg  po qD Ativan 1mg  po TID prn anxiety  2. Panic 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 Continue Buspar Ativan Effexor XR  3. Poor concentration Continue Straterra 40mg  po BID  4. Insomnia due to other mental disorder Continue Buspar and Ativan  5. Bipolar 2 disorder (HCC) Continue Depakote ER 250mg  po qAM and and 500mg  po qPM       Follow Up Instructions: 2-4 weeks or sooner if needed   I discussed the assessment and treatment plan with the patient. The patient was provided an opportunity to ask questions and all were answered. The patient agreed with the plan and demonstrated an understanding of the instructions.   The patient was advised to call back or seek an in-person evaluation if the symptoms worsen or if the condition fails to improve as anticipated.    , MD

## 2020-08-23 ENCOUNTER — Other Ambulatory Visit (HOSPITAL_COMMUNITY): Payer: Self-pay | Admitting: Psychiatry

## 2020-08-23 DIAGNOSIS — R37 Sexual dysfunction, unspecified: Secondary | ICD-10-CM

## 2020-08-23 MED FILL — LORazepam 1 MG TABS: 1 | 30 days supply | Qty: 90 | Fill #1

## 2020-08-23 MED FILL — VENLAFAXINE HCL ER 75 MG CA: 75 | 90 days supply | Qty: 270 | Fill #0

## 2020-08-23 MED FILL — DIVALPROEX SOD ER 250 MG TA: 250 | 90 days supply | Qty: 270 | Fill #0

## 2020-08-23 MED FILL — busPIRone HCL 15 MG TABS: 15 | 90 days supply | Qty: 135 | Fill #0

## 2020-08-24 ENCOUNTER — Other Ambulatory Visit (HOSPITAL_COMMUNITY): Payer: Self-pay | Admitting: Psychiatry

## 2020-08-25 MED FILL — TADALAFIL 10 MG TABS: 10 | 30 days supply | Qty: 10 | Fill #0

## 2020-09-07 ENCOUNTER — Other Ambulatory Visit: Payer: Self-pay

## 2020-09-07 ENCOUNTER — Encounter (HOSPITAL_COMMUNITY): Payer: Self-pay | Admitting: Psychiatry

## 2020-09-07 ENCOUNTER — Telehealth (INDEPENDENT_AMBULATORY_CARE_PROVIDER_SITE_OTHER): Payer: 59 | Admitting: Psychiatry

## 2020-09-07 ENCOUNTER — Other Ambulatory Visit (HOSPITAL_COMMUNITY): Payer: Self-pay | Admitting: Psychiatry

## 2020-09-07 DIAGNOSIS — F411 Generalized anxiety disorder: Secondary | ICD-10-CM | POA: Diagnosis not present

## 2020-09-07 DIAGNOSIS — F410 Panic disorder [episodic paroxysmal anxiety] without agoraphobia: Secondary | ICD-10-CM | POA: Diagnosis not present

## 2020-09-07 DIAGNOSIS — R41840 Attention and concentration deficit: Secondary | ICD-10-CM | POA: Diagnosis not present

## 2020-09-07 DIAGNOSIS — F3181 Bipolar II disorder: Secondary | ICD-10-CM | POA: Diagnosis not present

## 2020-09-07 MED ORDER — VENLAFAXINE HCL ER 75 MG PO CP24: 225.0000 mg | ORAL_CAPSULE | Freq: Every day | ORAL | 0 refills | Status: DC

## 2020-09-07 MED ORDER — ATOMOXETINE HCL 40 MG PO CAPS: 40.0000 mg | ORAL_CAPSULE | Freq: Two times a day (BID) | ORAL | 0 refills | Status: DC

## 2020-09-07 MED ORDER — BUSPIRONE HCL 15 MG PO TABS: ORAL_TABLET | ORAL | 0 refills | Status: DC

## 2020-09-07 MED ORDER — DIVALPROEX SODIUM ER 250 MG PO TB24: ORAL_TABLET | ORAL | 0 refills | Status: DC

## 2020-09-07 MED ORDER — LORAZEPAM 1 MG PO TABS: 1.0000 mg | ORAL_TABLET | Freq: Three times a day (TID) | ORAL | 2 refills | Status: DC | PRN

## 2020-09-07 MED FILL — ATOMOXETINE HCL 40 MG CAPS: 40 | 90 days supply | Qty: 180 | Fill #0

## 2020-09-07 NOTE — Progress Notes (Signed)
Virtual Visit via Telephone Note  I connected with Matthew [name] on 09/07/20 at  8:00 AM EST by telephone and verified that I am speaking with the correct person using two identifiers. *We attempted to connect by tele video conference but Matthew [name] was not able to log into myChart to do. He gave permission to continue by phone.   Location: Patient: home Provider: office   I discussed the limitations, risks, security and privacy concerns of performing an evaluation and management service by telephone and the availability of in person appointments. I also discussed with the patient that there may be a patient responsible charge related to this service. The patient expressed understanding and agreed to proceed.   History of Present Illness: Matthew [name] has returned to work. He has been back for about 5 days. His schedule is light for now but there is a lot going on. Matthew [name] is having difficulty "turning off" at the end of the work day. Anxiety has started again and is gradually getting worse. He is trying to use distraction to help but last night it was only minorly effective. Matthew [name] has been sleeping well. He is trying to figure out how to manage his work flow. Matthew [name] is working on getting his team to handle more. Matthew [name] has not had to take any Ativan while at work so far. At home Matthew [name] is trying to engage in self care and setting up boundaries that he work day end by a certain time. His depression is stable and manageable. He denies feeling withdrawn or isolation. Home life is good. Matthew [name] denies any manic or hypomanic like symptoms. He denies SI/HI.    Observations/Objective:  General Appearance: unable to assess  Eye Contact:  unable to assess  Speech:  Clear and Coherent and Normal Rate  Volume:  Normal  Mood:  Euthymic  Affect:  Full Range  Thought Process:  Goal Directed, Linear and Descriptions of Associations: Intact  Orientation:  Full (Time, Place, and Person)  Thought Content:   Logical  Suicidal Thoughts:  No  Homicidal Thoughts:  No  Memory:  Immediate;   Good  Judgement:  Good  Insight:  Good  Psychomotor Activity: unable to assess  Concentration:  Concentration: Good  Recall:  Good  Fund of Knowledge:  Good  Language:  Good  Akathisia:  unable to assess  Handed:  Right  AIMS (if indicated):     Assets:  Communication Skills Desire for Improvement Financial Resources/Insurance Housing Intimacy Leisure Time Physical Health Resilience Social Support Talents/Skills Transportation Vocational/Educational  ADL's:  unable to assess  Cognition:  WNL  Sleep:         Assessment and Plan: 1. Bipolar 2 disorder (HCC) - atomoxetine (STRATTERA) 40 MG capsule; Take 1 capsule (40 mg total) by mouth 2 (two) times daily with a meal.  Dispense: 360 capsule; Refill: 0 - divalproex (DEPAKOTE ER) 250 MG 24 hr tablet; Take 1 tab (250mg ) po qAM and 2 tabs (500mg ) po qPM  Dispense: 270 tablet; Refill: 0 - venlafaxine XR (EFFEXOR-XR) 75 MG 24 hr capsule; Take 3 capsules (225 mg total) by mouth daily with breakfast.  Dispense: 270 capsule; Refill: 0  2. Poor concentration - atomoxetine (STRATTERA) 40 MG capsule; Take 1 capsule (40 mg total) by mouth 2 (two) times daily with a meal.  Dispense: 360 capsule; Refill: 0  3. GAD (generalized anxiety disorder) - busPIRone (BUSPAR) 15 MG tablet; 7.5mg  po qAM and 15mg  qPM  Dispense: 135 tablet; Refill: 0 - LORazepam (  ATIVAN) 1 MG tablet; Take 1 tablet (1 mg total) by mouth 3 (three) times daily as needed for anxiety.  Dispense: 90 tablet; Refill: 2 - venlafaxine XR (EFFEXOR-XR) 75 MG 24 hr capsule; Take 3 capsules (225 mg total) by mouth daily with breakfast.  Dispense: 270 capsule; Refill: 0  4. Panic 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 - LORazepam (ATIVAN) 1 MG tablet; Take 1 tablet (1 mg total) by mouth 3 (three) times daily as needed for anxiety.  Dispense: 90 tablet; Refill: 2 - venlafaxine XR (EFFEXOR-XR) 75 MG 24 hr capsule;  Take 3 capsules (225 mg total) by mouth daily with breakfast.  Dispense: 270 capsule; Refill: 0  I will order labs at the next visit  Follow Up Instructions: In 2-3 weeks or sooner if needed   I discussed the assessment and treatment plan with the patient. The patient was provided an opportunity to ask questions and all were answered. The patient agreed with the plan and demonstrated an understanding of the instructions.   The patient was advised to call back or seek an in-person evaluation if the symptoms worsen or if the condition fails to improve as anticipated.  I provided 17 minutes of non-face-to-face time during this encounter.   [name], MD

## 2020-09-21 ENCOUNTER — Other Ambulatory Visit: Payer: Self-pay

## 2020-09-21 ENCOUNTER — Telehealth (INDEPENDENT_AMBULATORY_CARE_PROVIDER_SITE_OTHER): Payer: 59 | Admitting: Psychiatry

## 2020-09-21 DIAGNOSIS — F410 Panic disorder [episodic paroxysmal anxiety] without agoraphobia: Secondary | ICD-10-CM

## 2020-09-21 DIAGNOSIS — F99 Mental disorder, not otherwise specified: Secondary | ICD-10-CM

## 2020-09-21 DIAGNOSIS — F3181 Bipolar II disorder: Secondary | ICD-10-CM

## 2020-09-21 DIAGNOSIS — F5105 Insomnia due to other mental disorder: Secondary | ICD-10-CM

## 2020-09-21 DIAGNOSIS — R41840 Attention and concentration deficit: Secondary | ICD-10-CM | POA: Diagnosis not present

## 2020-09-21 DIAGNOSIS — F411 Generalized anxiety disorder: Secondary | ICD-10-CM | POA: Diagnosis not present

## 2020-09-21 NOTE — Progress Notes (Signed)
Virtual Visit via Telephone Note  I connected with Matthew [name] on 09/21/20 at  7:45 AM EST by telephone and verified that I am speaking with the correct person using two identifiers. We attempted to connect but were unable to do so, opted to continue by phone.  Location: Patient: home Provider: office   I discussed the limitations, risks, security and privacy concerns of performing an evaluation and management service by telephone and the availability of in person appointments. I also discussed with the patient that there may be a patient responsible charge related to this service. The patient expressed understanding and agreed to proceed.   History of Present Illness: Matthew [name] has been back to work for several weeks now. It is stressful. He is making an active effort to separate his home time and work time. This job makes it difficult to do so. His wife is helping him and they are working out some solutions. Delta's anxiety remains high. Sundays his anxiety is generally high. This past Sunday and one other day he had to take Ativan due to high anxiety and a panic attack. It helped to relax and calm him. His sleep is slowly improving. Some nights due to anxiety he it takes him a while to fall asleep. Matthew [name]'s depression relates to his anxiety. It is worse on days he is very anxious. He denies isolation and anhedonia. He denies SI/HI. He denies any manic or hypomanic like episodes.   Observations/Objective:  General Appearance: unable to assess  Eye Contact:  unable to assess  Speech:  Clear and Coherent and Normal Rate  Volume:  Normal  Mood:  Euthymic  Affect:  Appropriate and Congruent  Thought Process:  Goal Directed, Linear and Descriptions of Associations: Intact  Orientation:  Full (Time, Place, and Person)  Thought Content:  Logical  Suicidal Thoughts:  No  Homicidal Thoughts:  No  Memory:  Immediate;   Good  Judgement:  Good  Insight:  Good  Psychomotor Activity:  unable to assess  Concentration:  Concentration: Good  Recall:  Good  Fund of Knowledge:  Good  Language:  Good  Akathisia:  unable to assess  Handed:  Right  AIMS (if indicated):     Assets:  Communication Skills Desire for Improvement Financial Resources/Insurance Housing Intimacy Resilience Social Support Talents/Skills Transportation Vocational/Educational  ADL's:  unable to assess  Cognition:  WNL  Sleep:        I reviewed the information below on 09/21/20 and have updated it Assessment and Plan:  Bipolar 2 d/o; GAD; Panic 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; poor concentration; Insomnia    Strattera 40mg  po BID- insurance would not cover 50mg  BID    Depakote ER 250mg  po qAm and 500mg  qLunch   Effexor XR 225mg  po qD   Buspar 7.5mg  po AM and 15mg  po qHS   Ativan 1mg  po TID prn anxiety   Cialis 10mg  prn sexual activity  No refills today as pt states he does not need any at this time.  Follow Up Instructions: In 2 months or sooner if needed   I discussed the assessment and treatment plan with the patient. The patient was provided an opportunity to ask questions and all were answered. The patient agreed with the plan and demonstrated an understanding of the instructions.   The patient was advised to call back or seek an in-person evaluation if the symptoms worsen or if the condition fails to improve as anticipated.  I provided 21 minutes of non-face-to-face time during  this encounter.   Charlcie Cradle, MD

## 2020-10-05 ENCOUNTER — Other Ambulatory Visit (HOSPITAL_COMMUNITY): Payer: Self-pay | Admitting: Psychiatry

## 2020-10-05 DIAGNOSIS — R37 Sexual dysfunction, unspecified: Secondary | ICD-10-CM

## 2020-10-05 DIAGNOSIS — H6502 Acute serous otitis media, left ear: Secondary | ICD-10-CM | POA: Diagnosis not present

## 2020-10-05 MED FILL — AZITHROMYCIN 250 MG TABLET: 250 | 5 days supply | Qty: 6 | Fill #0

## 2020-10-05 MED FILL — ATOMOXETINE HCL 40 MG CAPS: 40 | 90 days supply | Qty: 180 | Fill #0

## 2020-10-05 MED FILL — TADALAFIL 10 MG TABS: 10 | 50 days supply | Qty: 10 | Fill #0

## 2020-10-05 MED FILL — predniSONE 20 MG TABS: 20 | 5 days supply | Qty: 15 | Fill #0

## 2020-10-26 ENCOUNTER — Other Ambulatory Visit (HOSPITAL_COMMUNITY): Payer: Self-pay | Admitting: Oral and Maxillofacial Surgery

## 2020-10-26 MED FILL — HYDROCODON-APAP 5-325: 5-325 | 2 days supply | Qty: 5 | Fill #0

## 2020-10-26 MED FILL — DEXAMETHASONE 4 MG TABLET: 4 | 3 days supply | Qty: 9 | Fill #0

## 2020-10-26 MED FILL — AMOXICILLIN 500 MG CAPSULE: 500 | 7 days supply | Qty: 21 | Fill #0

## 2020-10-26 MED FILL — TADALAFIL 10 MG TABS: 10 | 50 days supply | Qty: 10 | Fill #0

## 2020-10-26 MED FILL — IBUPROFEN 400 MG TABS: 400 | 5 days supply | Qty: 30 | Fill #0

## 2020-10-26 MED FILL — CHLORHEXIDINE 0.12% RINSE: 0.12 | 16 days supply | Qty: 473 | Fill #0

## 2020-11-16 ENCOUNTER — Other Ambulatory Visit: Payer: Self-pay

## 2020-11-16 ENCOUNTER — Telehealth (HOSPITAL_BASED_OUTPATIENT_CLINIC_OR_DEPARTMENT_OTHER): Payer: 59 | Admitting: Psychiatry

## 2020-11-16 ENCOUNTER — Other Ambulatory Visit (HOSPITAL_COMMUNITY): Payer: Self-pay | Admitting: Psychiatry

## 2020-11-16 DIAGNOSIS — F3181 Bipolar II disorder: Secondary | ICD-10-CM

## 2020-11-16 DIAGNOSIS — F411 Generalized anxiety disorder: Secondary | ICD-10-CM

## 2020-11-16 DIAGNOSIS — R41840 Attention and concentration deficit: Secondary | ICD-10-CM | POA: Diagnosis not present

## 2020-11-16 DIAGNOSIS — F410 Panic disorder [episodic paroxysmal anxiety] without agoraphobia: Secondary | ICD-10-CM | POA: Diagnosis not present

## 2020-11-16 MED ORDER — BUSPIRONE HCL 15 MG PO TABS: ORAL_TABLET | ORAL | 0 refills | Status: DC

## 2020-11-16 MED ORDER — VENLAFAXINE HCL ER 75 MG PO CP24: 225.0000 mg | ORAL_CAPSULE | Freq: Every day | ORAL | 0 refills | Status: DC

## 2020-11-16 MED ORDER — ATOMOXETINE HCL 40 MG PO CAPS: 40.0000 mg | ORAL_CAPSULE | Freq: Two times a day (BID) | ORAL | 0 refills | Status: DC

## 2020-11-16 MED ORDER — DIVALPROEX SODIUM ER 250 MG PO TB24: ORAL_TABLET | ORAL | 0 refills | Status: DC

## 2020-11-16 NOTE — Progress Notes (Signed)
Virtual Visit via Video Note  I connected with Matthew [name] on 11/16/20 at  8:00 AM EDT by a video enabled telemedicine application and verified that I am speaking with the correct person using two identifiers.  Location: Patient: home Provider: office   I discussed the limitations of evaluation and management by telemedicine and the availability of in person appointments. The patient expressed understanding and agreed to proceed.  History of Present Illness: Deloy shares that his sleep has been on the poor side. He feels depressed- 2-3 days every 3 weeks due to stress. He is endorsing some mild hypomanic like symptoms.He continues to have anxiety and panic attacks. It is mostly due to work stress. He has not been using Ativan. He denies SI/HI.    Observations/Objective: Psychiatric Specialty Exam: ROS  There were no vitals taken for this visit.There is no height or weight on file to calculate BMI.  General Appearance: Neat  Eye Contact:  Good  Speech:  Clear and Coherent and Normal Rate  Volume:  Normal  Mood:  Anxious and Depressed  Affect:  Full Range  Thought Process:  Goal Directed, Linear, and Descriptions of Associations: Intact  Orientation:  Full (Time, Place, and Person)  Thought Content:  Logical  Suicidal Thoughts:  No  Homicidal Thoughts:  No  Memory:  Immediate;   Good  Judgement:  Good  Insight:  Good  Psychomotor Activity:  Normal  Concentration:  Concentration: Good  Recall:  Good  Fund of Knowledge:  Good  Language:  Good  Akathisia:  No  Handed:  Right  AIMS (if indicated):     Assets:  Communication Skills Desire for Improvement Financial Resources/Insurance Housing Intimacy Resilience Social Support Talents/Skills Transportation Vocational/Educational  ADL's:  Intact  Cognition:  WNL  Sleep:        Assessment and Plan: 1. Bipolar 2 disorder (HCC) - atomoxetine (STRATTERA) 40 MG capsule; Take 1 capsule (40 mg total) by mouth 2  (two) times daily with a meal.  Dispense: 360 capsule; Refill: 0 - divalproex (DEPAKOTE ER) 250 MG 24 hr tablet; Take 1 tab (250mg ) po qAM and 2 tabs (500mg ) po qPM  Dispense: 270 tablet; Refill: 0 - venlafaxine XR (EFFEXOR-XR) 75 MG 24 hr capsule; Take 3 capsules (225 mg total) by mouth daily with breakfast.  Dispense: 270 capsule; Refill: 0  2. Poor concentration - atomoxetine (STRATTERA) 40 MG capsule; Take 1 capsule (40 mg total) by mouth 2 (two) times daily with a meal.  Dispense: 360 capsule; Refill: 0  3. GAD (generalized anxiety disorder) - busPIRone (BUSPAR) 15 MG tablet; 7.5mg  po qAM and 15mg  qPM  Dispense: 135 tablet; Refill: 0 - venlafaxine XR (EFFEXOR-XR) 75 MG 24 hr capsule; Take 3 capsules (225 mg total) by mouth daily with breakfast.  Dispense: 270 capsule; Refill: 0  4. Panic 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 - venlafaxine XR (EFFEXOR-XR) 75 MG 24 hr capsule; Take 3 capsules (225 mg total) by mouth daily with breakfast.  Dispense: 270 capsule; Refill: 0   - no refill on Ativan as Shana states he has enough  Depression screen [facility] 2/9 11/16/2020 04/07/2020 06/18/2019 05/14/2019 03/09/2019  Decreased Interest 0 0 0 0 0  Down, Depressed, Hopeless 1 0 0 0 0  PHQ - 2 Score 1 0 0 0 0  Altered sleeping - 0 0 0 0  Tired, decreased energy - 0 0 0 0  Change in appetite - 0 0 3 0  Feeling bad or failure about yourself  -  0 0 0 0  Trouble concentrating - 0 0 0 0  Moving slowly or fidgety/restless - 0 0 0 0  Suicidal thoughts - 0 0 0 0  PHQ-9 Score - 0 0 3 0  Difficult doing work/chores - Not difficult at all Not difficult at all Not difficult at all Somewhat difficult    Flowsheet Row [facility] Treatment from 09/11/2018 [facility] ECT Treatment from 09[facility]09/2018 in [facility]  C-SSRS RISK CATEGORY No Risk No Risk No Risk      - next ECT  treatment in May 2022  Follow Up Instructions: In 2-3 months or sooner if needed   I discussed the assessment and treatment plan with the patient. The patient was provided an opportunity to ask questions and all were answered. The patient agreed with the plan and demonstrated an understanding of the instructions.   The patient was advised to call back or seek an in-person evaluation if the symptoms worsen or if the condition fails to improve as anticipated.    [name], MD

## 2020-12-18 ENCOUNTER — Other Ambulatory Visit (HOSPITAL_COMMUNITY): Payer: Self-pay

## 2020-12-18 ENCOUNTER — Other Ambulatory Visit: Payer: Self-pay | Admitting: Family Medicine

## 2020-12-18 DIAGNOSIS — B001 Herpesviral vesicular dermatitis: Secondary | ICD-10-CM

## 2020-12-18 MED ORDER — VALACYCLOVIR HCL 1 G PO TABS
2000.0000 mg | ORAL_TABLET | Freq: Two times a day (BID) | ORAL | 6 refills | Status: DC
  Filled 2020-12-18: qty 4, 1d supply, fill #0

## 2020-12-18 NOTE — Telephone Encounter (Signed)
LFD 03/09/19 #4 with 6 refills LOV 04/07/20 NOV none

## 2021-01-18 ENCOUNTER — Other Ambulatory Visit: Payer: Self-pay

## 2021-01-18 ENCOUNTER — Other Ambulatory Visit (HOSPITAL_COMMUNITY): Payer: Self-pay

## 2021-01-18 ENCOUNTER — Telehealth (HOSPITAL_COMMUNITY): Payer: 59 | Admitting: Psychiatry

## 2021-01-18 MED FILL — Buspirone HCl Tab 15 MG: ORAL | 90 days supply | Qty: 135 | Fill #0 | Status: AC

## 2021-01-18 MED FILL — Divalproex Sodium Tab ER 24 HR 250 MG: ORAL | 90 days supply | Qty: 270 | Fill #0 | Status: AC

## 2021-01-18 MED FILL — Venlafaxine HCl Cap ER 24HR 75 MG (Base Equivalent): ORAL | 90 days supply | Qty: 270 | Fill #0 | Status: AC

## 2021-01-31 ENCOUNTER — Other Ambulatory Visit (HOSPITAL_COMMUNITY): Payer: Self-pay

## 2021-01-31 MED FILL — Atomoxetine HCl Cap 40 MG (Base Equiv): ORAL | 90 days supply | Qty: 180 | Fill #0 | Status: AC

## 2021-02-01 ENCOUNTER — Other Ambulatory Visit (HOSPITAL_COMMUNITY): Payer: Self-pay

## 2021-02-08 ENCOUNTER — Other Ambulatory Visit (HOSPITAL_COMMUNITY): Payer: Self-pay

## 2021-02-08 ENCOUNTER — Telehealth (INDEPENDENT_AMBULATORY_CARE_PROVIDER_SITE_OTHER): Payer: 59 | Admitting: Psychiatry

## 2021-02-08 ENCOUNTER — Other Ambulatory Visit: Payer: Self-pay

## 2021-02-08 DIAGNOSIS — F411 Generalized anxiety disorder: Secondary | ICD-10-CM

## 2021-02-08 DIAGNOSIS — R37 Sexual dysfunction, unspecified: Secondary | ICD-10-CM

## 2021-02-08 DIAGNOSIS — F410 Panic disorder [episodic paroxysmal anxiety] without agoraphobia: Secondary | ICD-10-CM

## 2021-02-08 DIAGNOSIS — F3181 Bipolar II disorder: Secondary | ICD-10-CM | POA: Diagnosis not present

## 2021-02-08 DIAGNOSIS — R41840 Attention and concentration deficit: Secondary | ICD-10-CM

## 2021-02-08 MED ORDER — TADALAFIL 10 MG PO TABS
10.0000 mg | ORAL_TABLET | Freq: Every day | ORAL | 0 refills | Status: DC | PRN
  Filled 2021-02-08 – 2021-04-05 (×2): qty 6, 30d supply, fill #0

## 2021-02-08 MED ORDER — VENLAFAXINE HCL ER 75 MG PO CP24
225.0000 mg | ORAL_CAPSULE | Freq: Every day | ORAL | 0 refills | Status: DC
  Filled 2021-02-08 – 2021-04-26 (×2): qty 270, 90d supply, fill #0

## 2021-02-08 MED ORDER — ATOMOXETINE HCL 40 MG PO CAPS
40.0000 mg | ORAL_CAPSULE | Freq: Two times a day (BID) | ORAL | 0 refills | Status: DC
  Filled 2021-02-08 – 2021-04-26 (×2): qty 180, 90d supply, fill #0

## 2021-02-08 MED ORDER — DIVALPROEX SODIUM ER 250 MG PO TB24
ORAL_TABLET | ORAL | 0 refills | Status: DC
  Filled 2021-02-08 – 2021-04-26 (×2): qty 270, 90d supply, fill #0

## 2021-02-08 MED ORDER — BUSPIRONE HCL 15 MG PO TABS
ORAL_TABLET | ORAL | 0 refills | Status: DC
  Filled 2021-02-08 – 2021-04-26 (×2): qty 135, 90d supply, fill #0

## 2021-02-08 NOTE — Progress Notes (Signed)
Virtual Visit via Video Note  I connected with Matthew [name] on 02/08/21 at  8:00 AM EDT by a video enabled telemedicine application and verified that I am speaking with the correct person using two identifiers.  Location: Patient: home Provider: office   I discussed the limitations of evaluation and management by telemedicine and the availability of in person appointments. The patient expressed understanding and agreed to proceed.  History of Present Illness: Matthew [name] shares that he is doing well. Things are busy at work but it has been alright.  The kids are busy this summer. He is active with [facility] family. He might take a vacation with the family in July. Matthew [name] has staying focused on what he can do. Sometimes it gets overwhelming but his anxiety is manageable. He has only had 1-2 panic attacks for up to 30 min and he has not taken any Ativan in months. May was a tough month and he felt down or whelmed. He doesn't it was deep depression. He denies anhedonia His ECT was scheduled for 01/19/21 and he acciently missed it. He states he was doing well and didn't think he needed it. At this point he doesn't want to schedule it. He is willing to get it if needed in the future. Pt denies recent manic and hypomanic symptoms including periods of decreased need for sleep, increased energy, mood lability, impulsivity, FOI, and excessive spending. Matthew [name] continues to engage in self care.     Observations/Objective: Psychiatric Specialty Exam: ROS  There were no vitals taken for this visit.There is no height or weight on file to calculate BMI.  General Appearance: Casual and Neat  Eye Contact:  Good  Speech:  Clear and Coherent and Normal Rate  Volume:  Normal  Mood:  Euthymic  Affect:  Full Range  Thought Process:  Goal Directed, Linear, and Descriptions of Associations: Intact  Orientation:  Full (Time, Place, and Person)  Thought Content:  Logical  Suicidal Thoughts:  No  Homicidal Thoughts:   No  Memory:  Immediate;   Good  Judgement:  Good  Insight:  Good  Psychomotor Activity:  Normal  Concentration:  Concentration: Good  Recall:  Good  Fund of Knowledge:  Good  Language:  Good  Akathisia:  No  Handed:  Right  AIMS (if indicated):     Assets:  Communication Skills Desire for Improvement Financial Resources/Insurance Housing Intimacy Leisure Time Physical Health Resilience Social Support Talents/Skills Transportation Vocational/Educational  ADL's:  Intact  Cognition:  WNL  Sleep:        Assessment and Plan: We were cut off before I could do a medication review or allergy update. Overall Matthew [name] is endorsing stable mood and is doing well. He will reschudule his ECT appointment for sometime in July.  1. Bipolar 2 disorder (HCC) - atomoxetine (STRATTERA) 40 MG capsule; TAKE 1 CAPSULE (40 MG TOTAL) BY MOUTH 2 (TWO) TIMES DAILY WITH A MEAL.  Dispense: 360 capsule; Refill: 0 - divalproex (DEPAKOTE ER) 250 MG 24 hr tablet; TAKE 1 TABLET BY MOUTH IN THE MORNING & 2 TABS IN THE EVENING  Dispense: 270 tablet; Refill: 0 - venlafaxine XR (EFFEXOR-XR) 75 MG 24 hr capsule; Take 3 capsules (225 mg total) by mouth daily with breakfast.  Dispense: 270 capsule; Refill: 0  2. Poor concentration - atomoxetine (STRATTERA) 40 MG capsule; TAKE 1 CAPSULE (40 MG TOTAL) BY MOUTH 2 (TWO) TIMES DAILY WITH A MEAL.  Dispense: 360 capsule; Refill: 0  3. GAD (generalized anxiety  disorder) - busPIRone (BUSPAR) 15 MG tablet; TAKE 1/2 TABLET BY MOUTH IN THE MORNING & 1 TAB IN THE EVENING  Dispense: 135 tablet; Refill: 0 - venlafaxine XR (EFFEXOR-XR) 75 MG 24 hr capsule; Take 3 capsules (225 mg total) by mouth daily with breakfast.  Dispense: 270 capsule; Refill: 0  4. Sexual dysfunction, unspecified - tadalafil (CIALIS) 10 MG tablet; TAKE 1 TABLET BY MOUTH ONCE DAILY AS NEEDED FOR ERECTILE DYSFUNCTION.  Dispense: 10 tablet; Refill: 0  5. Panic 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 - venlafaxine XR  (EFFEXOR-XR) 75 MG 24 hr capsule; Take 3 capsules (225 mg total) by mouth daily with breakfast.  Dispense: 270 capsule; Refill: 0 - no refill on Ativan as Matthew [name] has not been taking it   Follow Up Instructions: In 2-3 months or sooner if needed   I discussed the assessment and treatment plan with the patient. The patient was provided an opportunity to ask questions and all were answered. The patient agreed with the plan and demonstrated an understanding of the instructions.   The patient was advised to call back or seek an in-person evaluation if the symptoms worsen or if the condition fails to improve as anticipated.    [name], MD

## 2021-02-14 ENCOUNTER — Encounter: Payer: Self-pay | Admitting: *Deleted

## 2021-02-20 ENCOUNTER — Other Ambulatory Visit (HOSPITAL_COMMUNITY): Payer: Self-pay

## 2021-04-05 ENCOUNTER — Other Ambulatory Visit (HOSPITAL_COMMUNITY): Payer: Self-pay

## 2021-04-05 ENCOUNTER — Other Ambulatory Visit: Payer: Self-pay | Admitting: Family Medicine

## 2021-04-05 NOTE — Telephone Encounter (Signed)
Medication was d/c. Please advise.

## 2021-04-26 ENCOUNTER — Other Ambulatory Visit (HOSPITAL_COMMUNITY): Payer: Self-pay

## 2021-04-27 ENCOUNTER — Other Ambulatory Visit (HOSPITAL_COMMUNITY): Payer: Self-pay

## 2021-04-27 ENCOUNTER — Ambulatory Visit: Payer: 59 | Admitting: Family Medicine

## 2021-04-30 ENCOUNTER — Other Ambulatory Visit (HOSPITAL_COMMUNITY): Payer: Self-pay

## 2021-05-01 ENCOUNTER — Other Ambulatory Visit (HOSPITAL_COMMUNITY): Payer: Self-pay

## 2021-05-03 ENCOUNTER — Other Ambulatory Visit (HOSPITAL_COMMUNITY): Payer: Self-pay

## 2021-05-03 ENCOUNTER — Telehealth (INDEPENDENT_AMBULATORY_CARE_PROVIDER_SITE_OTHER): Payer: 59 | Admitting: Psychiatry

## 2021-05-03 ENCOUNTER — Other Ambulatory Visit: Payer: Self-pay

## 2021-05-03 DIAGNOSIS — Z5181 Encounter for therapeutic drug level monitoring: Secondary | ICD-10-CM | POA: Diagnosis not present

## 2021-05-03 DIAGNOSIS — R41840 Attention and concentration deficit: Secondary | ICD-10-CM | POA: Diagnosis not present

## 2021-05-03 DIAGNOSIS — F410 Panic disorder [episodic paroxysmal anxiety] without agoraphobia: Secondary | ICD-10-CM

## 2021-05-03 DIAGNOSIS — F411 Generalized anxiety disorder: Secondary | ICD-10-CM

## 2021-05-03 DIAGNOSIS — F3181 Bipolar II disorder: Secondary | ICD-10-CM | POA: Diagnosis not present

## 2021-05-03 MED ORDER — VENLAFAXINE HCL ER 75 MG PO CP24
225.0000 mg | ORAL_CAPSULE | Freq: Every day | ORAL | 0 refills | Status: DC
  Filled 2021-05-03: qty 270, 90d supply, fill #0

## 2021-05-03 MED ORDER — BUSPIRONE HCL 15 MG PO TABS
ORAL_TABLET | ORAL | 0 refills | Status: DC
  Filled 2021-05-03: qty 135, 90d supply, fill #0

## 2021-05-03 MED ORDER — DIVALPROEX SODIUM ER 250 MG PO TB24
ORAL_TABLET | ORAL | 0 refills | Status: DC
  Filled 2021-05-03: qty 270, 90d supply, fill #0

## 2021-05-03 MED ORDER — ATOMOXETINE HCL 40 MG PO CAPS
40.0000 mg | ORAL_CAPSULE | Freq: Two times a day (BID) | ORAL | 0 refills | Status: DC
  Filled 2021-06-29: qty 180, 90d supply, fill #0

## 2021-05-03 NOTE — Progress Notes (Signed)
Virtual Visit via Video Note  I connected with Matthew [name] on 05/03/21 at  8:00 AM EDT by a video enabled telemedicine application and verified that I am speaking with the correct person using two identifiers.  Location: Patient: home Provider: office   I discussed the limitations of evaluation and management by telemedicine and the availability of in person appointments. The patient expressed understanding and agreed to proceed.  History of Present Illness: Matthew [name] states he is stable overall. It is the end of the fiscal year which is always very stressful. He has a team helping him. The problem is that others are making a lot of mistakes that he has to check behind. He has realized that he can only do what he can do. Matthew [name] takes breaks and monitoring himself. He sleeps when he needs to. Matthew [name]'s sleep is not great because he is working and disturbed by going the bathroom multiple times each night. He slept in this past weekend. His energy is fair. At work he is focused and accomplished. At home he is dealing with teenagers. He and his family are doing well. His wife is supportive. Due to stress he is having anxiety but denies panic attacks. Now he has been experiencing "knot in stomach" and then he determines the stressors and plan of action. This helps and he accepts what he can do. Matthew [name] denies feeling any depression. He denies anhedonia. He has no desire to run away. He denies SI/HI. Matthew [name] denies manic and hypomanic like symptoms.    Observations/Objective: Psychiatric Specialty Exam: ROS  There were no vitals taken for this visit.There is no height or weight on file to calculate BMI.  General Appearance: Casual and Neat  Eye Contact:  Good  Speech:  Clear and Coherent and Normal Rate  Volume:  Normal  Mood:  Euthymic  Affect:  Full Range  Thought Process:  Goal Directed, Linear, and Descriptions of Associations: Intact  Orientation:  Full (Time, Place, and Person)  Thought Content:   Logical  Suicidal Thoughts:  No  Homicidal Thoughts:  No  Memory:  Immediate;   Good  Judgement:  Good  Insight:  Good  Psychomotor Activity:  Normal  Concentration:  Concentration: Good  Recall:  Good  Fund of Knowledge:  Good  Language:  Good  Akathisia:  No  Handed:  Right  AIMS (if indicated):     Assets:  Communication Skills Desire for Improvement Financial Resources/Insurance Housing Intimacy Leisure Time Resilience Social Support Talents/Skills Transportation Vocational/Educational  ADL's:  Intact  Cognition:  WNL  Sleep:        Assessment and Plan: Depression screen [facility]: [name] 2/9 05/03/2021 02/08/2021 11/16/2020 04/07/2020 06/18/2019  Decreased Interest 0 0 0 0 0  Down, Depressed, Hopeless 0 0 1 0 0  PHQ - 2 Score 0 0 1 0 0  Altered sleeping - - - 0 0  Tired, decreased energy - - - 0 0  Change in appetite - - - 0 0  Feeling bad or failure about yourself  - - - 0 0  Trouble concentrating - - - 0 0  Moving slowly or fidgety/restless - - - 0 0  Suicidal thoughts - - - 0 0  PHQ-9 Score - - - 0 0  Difficult doing work/chores - - - Not difficult at all Not difficult at all    Flowsheet Row Video Visit [facility]/03/2021 [facility] Video Visit from 02/08/2021 in [facility] Video  Visit from 11/16/2020 in [facility]  C-SSRS RISK CATEGORY No Risk No Risk No Risk      1. Bipolar 2 disorder (HCC) - atomoxetine (STRATTERA) 40 MG capsule; Take 1 capsule (40 mg total) by mouth 2 (two) times daily with a meal.  Dispense: 360 capsule; Refill: 0 - divalproex (DEPAKOTE ER) 250 MG 24 hr tablet; TAKE 1 TABLET BY MOUTH IN THE MORNING & 2 TABS IN THE EVENING  Dispense: 270 tablet; Refill: 0 - venlafaxine XR (EFFEXOR-XR) 75 MG 24 hr capsule; Take 3 capsules (225 mg total) by mouth daily with breakfast.  Dispense: 270 capsule; Refill: 0  2. Poor concentration -  atomoxetine (STRATTERA) 40 MG capsule; Take 1 capsule (40 mg total) by mouth 2 (two) times daily with a meal.  Dispense: 360 capsule; Refill: 0  3. GAD (generalized anxiety disorder) - busPIRone (BUSPAR) 15 MG tablet; TAKE 1/2 TABLET BY MOUTH IN THE MORNING & 1 TAB IN THE EVENING  Dispense: 135 tablet; Refill: 0 - venlafaxine XR (EFFEXOR-XR) 75 MG 24 hr capsule; Take 3 capsules (225 mg total) by mouth daily with breakfast.  Dispense: 270 capsule; Refill: 0  4. Panic 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 - venlafaxine XR (EFFEXOR-XR) 75 MG 24 hr capsule; Take 3 capsules (225 mg total) by mouth daily with breakfast.  Dispense: 270 capsule; Refill: 0  5. Encounter for therapeutic drug monitoring - Valproic acid level - CBC - Comprehensive metabolic panel     Follow Up Instructions: In 3 months or sooner if needed   I discussed the assessment and treatment plan with the patient. The patient was provided an opportunity to ask questions and all were answered. The patient agreed with the plan and demonstrated an understanding of the instructions.   The patient was advised to call back or seek an in-person evaluation if the symptoms worsen or if the condition fails to improve as anticipated.  I provided 16 minutes of non-face-to-face time during this encounter.   [name], MD

## 2021-05-09 ENCOUNTER — Other Ambulatory Visit (HOSPITAL_COMMUNITY): Payer: Self-pay

## 2021-06-05 ENCOUNTER — Ambulatory Visit: Payer: 59 | Admitting: Family Medicine

## 2021-06-18 ENCOUNTER — Other Ambulatory Visit (HOSPITAL_COMMUNITY): Payer: Self-pay

## 2021-06-18 MED ORDER — INFLUENZA VAC SPLIT QUAD 0.5 ML IM SUSY
0.5000 mL | PREFILLED_SYRINGE | INTRAMUSCULAR | 0 refills | Status: DC
  Filled 2021-06-18: qty 0.5, 1d supply, fill #0

## 2021-06-26 ENCOUNTER — Ambulatory Visit: Payer: 59 | Admitting: Family Medicine

## 2021-06-29 ENCOUNTER — Other Ambulatory Visit (HOSPITAL_COMMUNITY): Payer: Self-pay

## 2021-07-10 ENCOUNTER — Other Ambulatory Visit (HOSPITAL_COMMUNITY): Payer: Self-pay

## 2021-07-19 ENCOUNTER — Other Ambulatory Visit (HOSPITAL_COMMUNITY): Payer: Self-pay

## 2021-07-19 ENCOUNTER — Other Ambulatory Visit: Payer: Self-pay

## 2021-07-19 ENCOUNTER — Telehealth (HOSPITAL_BASED_OUTPATIENT_CLINIC_OR_DEPARTMENT_OTHER): Payer: 59 | Admitting: Psychiatry

## 2021-07-19 DIAGNOSIS — F411 Generalized anxiety disorder: Secondary | ICD-10-CM

## 2021-07-19 DIAGNOSIS — R41840 Attention and concentration deficit: Secondary | ICD-10-CM | POA: Diagnosis not present

## 2021-07-19 DIAGNOSIS — F3181 Bipolar II disorder: Secondary | ICD-10-CM

## 2021-07-19 DIAGNOSIS — F410 Panic disorder [episodic paroxysmal anxiety] without agoraphobia: Secondary | ICD-10-CM

## 2021-07-19 MED ORDER — VENLAFAXINE HCL ER 75 MG PO CP24
225.0000 mg | ORAL_CAPSULE | Freq: Every day | ORAL | 0 refills | Status: DC
  Filled 2021-07-19: qty 270, 90d supply, fill #0

## 2021-07-19 MED ORDER — DIVALPROEX SODIUM ER 250 MG PO TB24
500.0000 mg | ORAL_TABLET | Freq: Every day | ORAL | 0 refills | Status: DC
  Filled 2021-07-19: qty 180, 90d supply, fill #0

## 2021-07-19 MED ORDER — ATOMOXETINE HCL 40 MG PO CAPS
40.0000 mg | ORAL_CAPSULE | Freq: Two times a day (BID) | ORAL | 0 refills | Status: DC
  Filled 2021-07-19: qty 180, 90d supply, fill #0

## 2021-07-19 NOTE — Progress Notes (Signed)
Virtual Visit via Telephone Note  I connected with Matthew [name] on 07/19/21 at  8:00 AM EST by telephone and verified that I am speaking with the correct person using two identifiers. We were unable to keep an active connect on video and after several minutes of restarting the videos we opted to continue by phone.  Location: Patient: car Provider: office   I discussed the limitations, risks, security and privacy concerns of performing an evaluation and management service by telephone and the availability of in person appointments. I also discussed with the patient that there may be a patient responsible charge related to this service. The patient expressed understanding and agreed to proceed.   History of Present Illness: Matthew [name] shares that he is doing well overall.  He is endorsing some stressors at work and at home.  He states that his depression and anxiety are mild and manageable.  Matthew [name] states that he does sometimes get overwhelmed at work and has had a few stress-induced panic attacks that he has worked through.  He stopped taking Ativan over 6 months ago.  He stopped taking BuSpar in May of this year because he felt like his anxiety had improved dramatically and that he was able to manage it without medications.  He stopped taking Depakote in March of this year because he felt that he was doing well and he did not want to continue with blood draws.  He continued to take Effexor.  Matthew [name] maintains that his mood is stable and he is denying any manic or hypomanic like symptoms or episodes.  He denies SI/HI.  Matthew [name] states that the [name] is working well and he is able to focus and stay productive at home and at work.  As far as the issues at home he states that he does talk about it with his wife when issues arise.  He is engaging in self-care when possible.   Observations/Objective:  General Appearance: unable to assess  Eye Contact:  unable to assess  Speech:  Clear and Coherent  and Normal Rate  Volume:  Normal  Mood:  Euthymic  Affect:   mildly irritable but overall appropriate.   Thought Process:  Goal Directed, Linear, and Descriptions of Associations: Intact  Orientation:  Full (Time, Place, and Person)  Thought Content:  Logical  Suicidal Thoughts:  No  Homicidal Thoughts:  No  Memory:  Immediate;   Good  Judgement:  Good  Insight:  Good  Psychomotor Activity: unable to assess  Concentration:  Concentration: Good  Recall:  Good  Fund of Knowledge:  Good  Language:  Good  Akathisia:  unable to assess  Handed:  unable to assess  AIMS (if indicated):     Assets:  Communication Skills Desire for Improvement Financial Resources/Insurance Housing Intimacy Leisure Time Resilience Social Support Talents/Skills Transportation Vocational/Educational  ADL's:  unable to assess  Cognition:  WNL  Sleep:        Assessment and Plan:  Restart Depakote 500mg  po qHS for Bipolar disorder. We talked about the importance treatment with a mood stabalizer for Bipolar disorder and the risk of flipping into a manic episode with treatment with an antidepressant alone. [name] verbalized understanding and is willing to restart Depakote at a lower dose.   D/c Buspar  1. Bipolar 2 disorder (HCC) - atomoxetine (STRATTERA) 40 MG capsule; Take 1 capsule (40 mg total) by mouth 2 (two) times daily with a meal.  Dispense: 360 capsule; Refill: 0 - divalproex (DEPAKOTE ER) 250  MG 24 hr tablet; Take 2 tablets (500 mg total) by mouth at bedtime.  Dispense: 270 tablet; Refill: 0 - venlafaxine XR (EFFEXOR-XR) 75 MG 24 hr capsule; Take 3 capsules (225 mg total) by mouth daily with breakfast.  Dispense: 270 capsule; Refill: 0  2. Poor concentration - atomoxetine (STRATTERA) 40 MG capsule; Take 1 capsule (40 mg total) by mouth 2 (two) times daily with a meal.  Dispense: 360 capsule; Refill: 0  3. GAD (generalized anxiety disorder) - venlafaxine XR (EFFEXOR-XR) 75 MG 24 hr  capsule; Take 3 capsules (225 mg total) by mouth daily with breakfast.  Dispense: 270 capsule; Refill: 0  4. Panic 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 - venlafaxine XR (EFFEXOR-XR) 75 MG 24 hr capsule; Take 3 capsules (225 mg total) by mouth daily with breakfast.  Dispense: 270 capsule; Refill: 0    Follow Up Instructions: In 2 months or sooner if needed   I discussed the assessment and treatment plan with the patient. The patient was provided an opportunity to ask questions and all were answered. The patient agreed with the plan and demonstrated an understanding of the instructions.   The patient was advised to call back or seek an in-person evaluation if the symptoms worsen or if the condition fails to improve as anticipated.  I provided 18 minutes of non-face-to-face time during this encounter.   [name], MD

## 2021-08-29 ENCOUNTER — Other Ambulatory Visit (HOSPITAL_COMMUNITY): Payer: Self-pay

## 2021-08-29 MED ORDER — AZITHROMYCIN 250 MG PO TABS
ORAL_TABLET | ORAL | 0 refills | Status: DC
  Filled 2021-08-29: qty 6, 5d supply, fill #0

## 2021-08-29 MED ORDER — ACETAMINOPHEN-CODEINE #3 300-30 MG PO TABS
1.0000 | ORAL_TABLET | Freq: Four times a day (QID) | ORAL | 0 refills | Status: DC | PRN
  Filled 2021-08-29: qty 12, 3d supply, fill #0

## 2021-08-29 MED ORDER — CHLORHEXIDINE GLUCONATE 0.12 % MT SOLN
15.0000 mL | OROMUCOSAL | 0 refills | Status: DC
  Filled 2021-08-29: qty 473, 16d supply, fill #0

## 2021-08-29 MED ORDER — IBUPROFEN 600 MG PO TABS
600.0000 mg | ORAL_TABLET | Freq: Four times a day (QID) | ORAL | 0 refills | Status: DC | PRN
  Filled 2021-08-29: qty 12, 3d supply, fill #0

## 2021-08-31 ENCOUNTER — Emergency Department (HOSPITAL_COMMUNITY): Payer: 59

## 2021-08-31 ENCOUNTER — Emergency Department (HOSPITAL_COMMUNITY)
Admission: EM | Admit: 2021-08-31 | Discharge: 2021-08-31 | Disposition: A | Discharge status: Home / Self Care | Payer: 59 | Attending: Emergency Medicine | Admitting: Emergency Medicine

## 2021-08-31 ENCOUNTER — Encounter (HOSPITAL_COMMUNITY): Payer: Self-pay

## 2021-08-31 ENCOUNTER — Other Ambulatory Visit: Payer: Self-pay

## 2021-08-31 DIAGNOSIS — R112 Nausea with vomiting, unspecified: Secondary | ICD-10-CM | POA: Insufficient documentation

## 2021-08-31 DIAGNOSIS — R519 Headache, unspecified: Secondary | ICD-10-CM | POA: Diagnosis not present

## 2021-08-31 DIAGNOSIS — Z20828 Contact with and (suspected) exposure to other viral communicable diseases: Secondary | ICD-10-CM | POA: Diagnosis not present

## 2021-08-31 DIAGNOSIS — R5383 Other fatigue: Secondary | ICD-10-CM | POA: Diagnosis not present

## 2021-08-31 DIAGNOSIS — Y900 Blood alcohol level of less than 20 mg/100 ml: Secondary | ICD-10-CM | POA: Diagnosis not present

## 2021-08-31 DIAGNOSIS — Z79899 Other long term (current) drug therapy: Secondary | ICD-10-CM | POA: Diagnosis not present

## 2021-08-31 DIAGNOSIS — J449 Chronic obstructive pulmonary disease, unspecified: Secondary | ICD-10-CM | POA: Insufficient documentation

## 2021-08-31 DIAGNOSIS — Z20822 Contact with and (suspected) exposure to covid-19: Secondary | ICD-10-CM | POA: Insufficient documentation

## 2021-08-31 DIAGNOSIS — R059 Cough, unspecified: Secondary | ICD-10-CM | POA: Diagnosis not present

## 2021-08-31 DIAGNOSIS — R42 Dizziness and giddiness: Secondary | ICD-10-CM | POA: Diagnosis not present

## 2021-08-31 DIAGNOSIS — I6381 Other cerebral infarction due to occlusion or stenosis of small artery: Secondary | ICD-10-CM | POA: Diagnosis not present

## 2021-08-31 LAB — URINALYSIS, ROUTINE W REFLEX MICROSCOPIC
Bilirubin Urine: NEGATIVE
Glucose, UA: NEGATIVE mg/dL
Hgb urine dipstick: NEGATIVE
Ketones, ur: NEGATIVE mg/dL
Leukocytes,Ua: NEGATIVE
Nitrite: NEGATIVE
Protein, ur: NEGATIVE mg/dL
Specific Gravity, Urine: 1.015 (ref 1.005–1.030)
pH: 7 (ref 5.0–8.0)

## 2021-08-31 LAB — COMPREHENSIVE METABOLIC PANEL
ALT: 45 U/L — ABNORMAL HIGH (ref 0–44)
AST: 28 U/L (ref 15–41)
Albumin: 4.7 g/dL (ref 3.5–5.0)
Alkaline Phosphatase: 115 U/L (ref 38–126)
Anion gap: 9 (ref 5–15)
BUN: 11 mg/dL (ref 6–20)
CO2: 22 mmol/L (ref 22–32)
Calcium: 9.5 mg/dL (ref 8.9–10.3)
Chloride: 106 mmol/L (ref 98–111)
Creatinine, Ser: 0.68 mg/dL (ref 0.61–1.24)
GFR, Estimated: >60 mL/min (ref >60)
Glucose, Bld: 126 mg/dL — ABNORMAL HIGH (ref 70–99)
Potassium: 3.7 mmol/L (ref 3.5–5.1)
Sodium: 137 mmol/L (ref 135–145)
Total Bilirubin: 0.9 mg/dL (ref 0.3–1.2)
Total Protein: 7.7 g/dL (ref 6.5–8.1)

## 2021-08-31 LAB — CBC WITH DIFFERENTIAL/PLATELET
Abs Immature Granulocytes: 0.03 10*3/uL (ref 0.00–0.07)
Basophils Absolute: 0 10*3/uL (ref 0.0–0.1)
Basophils Relative: 1 %
Eosinophils Absolute: 0.2 10*3/uL (ref 0.0–0.5)
Eosinophils Relative: 3 %
HCT: 45.3 % (ref 39.0–52.0)
Hemoglobin: 16.4 g/dL (ref 13.0–17.0)
Immature Granulocytes: 0 %
Lymphocytes Relative: 29 %
Lymphs Abs: 2.6 10*3/uL (ref 0.7–4.0)
MCH: 33 pg (ref 26.0–34.0)
MCHC: 36.2 g/dL — ABNORMAL HIGH (ref 30.0–36.0)
MCV: 91.1 fL (ref 80.0–100.0)
Monocytes Absolute: 0.7 10*3/uL (ref 0.1–1.0)
Monocytes Relative: 9 %
Neutro Abs: 5.2 10*3/uL (ref 1.7–7.7)
Neutrophils Relative %: 58 %
Platelets: 257 10*3/uL (ref 150–400)
RBC: 4.97 MIL/uL (ref 4.22–5.81)
RDW: 12.7 % (ref 11.5–15.5)
WBC: 8.7 10*3/uL (ref 4.0–10.5)
nRBC: 0 % (ref 0.0–0.2)

## 2021-08-31 LAB — RAPID URINE DRUG SCREEN, HOSP PERFORMED
Amphetamines: NOT DETECTED
Barbiturates: NOT DETECTED
Benzodiazepines: NOT DETECTED
Cocaine: NOT DETECTED
Opiates: POSITIVE — AB
Tetrahydrocannabinol: POSITIVE — AB

## 2021-08-31 LAB — BLOOD GAS, VENOUS
Acid-Base Excess: 0.3 mmol/L (ref 0.0–2.0)
Bicarbonate: 23 mmol/L (ref 20.0–28.0)
O2 Saturation: 97.3 %
Patient temperature: 98.6
pCO2, Ven: 33.6 mmHg — ABNORMAL LOW (ref 44.0–60.0)
pH, Ven: 7.45 — ABNORMAL HIGH (ref 7.250–7.430)
pO2, Ven: 87.5 mmHg — ABNORMAL HIGH (ref 32.0–45.0)

## 2021-08-31 LAB — LIPASE, BLOOD: Lipase: 38 U/L (ref 11–51)

## 2021-08-31 LAB — CBG MONITORING, ED: Glucose-Capillary: 142 mg/dL — ABNORMAL HIGH (ref 70–99)

## 2021-08-31 LAB — RESP PANEL BY RT-PCR (FLU A&B, COVID) ARPGX2
Influenza A by PCR: NEGATIVE
Influenza B by PCR: NEGATIVE
SARS Coronavirus 2 by RT PCR: NEGATIVE

## 2021-08-31 LAB — ETHANOL: Alcohol, Ethyl (B): <10 mg/dL (ref <10)

## 2021-08-31 MED ORDER — HYDROCODONE-ACETAMINOPHEN 5-325 MG PO TABS
1.0000 | ORAL_TABLET | Freq: Once | ORAL | Status: AC
  Administered 2021-08-31: 1 via ORAL
  Filled 2021-08-31: qty 1

## 2021-08-31 MED ORDER — ONDANSETRON 4 MG PO TBDP
4.0000 mg | ORAL_TABLET | Freq: Once | ORAL | Status: AC
Start: 2021-08-31 — End: 2021-08-31
  Administered 2021-08-31: 4 mg via ORAL
  Filled 2021-08-31: qty 1

## 2021-08-31 MED ORDER — BENZONATATE 100 MG PO CAPS
100.0000 mg | ORAL_CAPSULE | Freq: Three times a day (TID) | ORAL | 0 refills | Status: AC
  Filled 2021-08-31: qty 15, 5d supply, fill #0

## 2021-08-31 MED ORDER — LORAZEPAM 2 MG/ML IJ SOLN
0.5000 mg | Freq: Once | INTRAMUSCULAR | Status: AC
  Administered 2021-08-31: 0.5 mg via INTRAVENOUS
  Filled 2021-08-31: qty 1

## 2021-08-31 MED ORDER — ONDANSETRON HCL 4 MG PO TABS
4.0000 mg | ORAL_TABLET | Freq: Four times a day (QID) | ORAL | 0 refills | Status: DC
  Filled 2021-08-31: qty 12, 3d supply, fill #0

## 2021-08-31 MED ORDER — SODIUM CHLORIDE 0.9 % IV BOLUS
1000.0000 mL | Freq: Once | INTRAVENOUS | Status: AC
  Administered 2021-08-31: 1000 mL via INTRAVENOUS

## 2021-08-31 MED ORDER — IBUPROFEN 200 MG PO TABS
400.0000 mg | ORAL_TABLET | Freq: Once | ORAL | Status: AC
  Administered 2021-08-31: 400 mg via ORAL
  Filled 2021-08-31: qty 2

## 2021-08-31 MED ORDER — ACETAMINOPHEN 325 MG PO TABS
650.0000 mg | ORAL_TABLET | Freq: Once | ORAL | Status: AC
  Administered 2021-08-31: 650 mg via ORAL
  Filled 2021-08-31: qty 2

## 2021-08-31 NOTE — ED Notes (Signed)
Patient ambulated around the room. During ambulation the patient appear very unsteady. He did report dizziness. O2 sats remained in the upper 90's and his HR remained in the lower 110's.

## 2021-08-31 NOTE — Discharge Instructions (Addendum)
You were given a prescription for zofran to help with your nausea. Please take as directed.  You were also given tessalon for your cough. You likely have viral infection. There was no evidence of pneumonia on your chest xray.  Please follow up with your primary doctor within the next 5-7 days.  If you do not have a primary care provider, information for a healthcare clinic has been provided for you to make arrangements for follow up care. Please return to the ER sooner if you have any new or worsening symptoms, or if you have any of the following symptoms:  Abdominal pain that does not go away.  You have a fever.  You keep throwing up (vomiting).  The pain is felt only in portions of the abdomen. Pain in the right side could possibly be appendicitis. In an adult, pain in the left lower portion of the abdomen could be colitis or diverticulitis.  You pass bloody or black tarry stools.  There is bright red blood in the stool.  The constipation stays for more than 4 days.  There is belly (abdominal) or rectal pain.  You do not seem to be getting better.  You have any questions or concerns.

## 2021-08-31 NOTE — ED Triage Notes (Addendum)
Patient's visitor reports that the patient woke this AM with dizziness, headache, N/V and weakness.  Patient went to an UC and tested negative for a Covid test. Patient acting sporadic and got out of the wheelchair and threw himself on the floor. Writer trying to get the patient up and patient pushing against the Probation officer. Writer called for help and 2 other staff members helped to get the patient to the stretcher chair.

## 2021-08-31 NOTE — ED Provider Notes (Addendum)
[facility] DEPT Provider Note   CSN: [id number] Arrival date & time: 08/31/21  1340     History  Chief Complaint  Patient presents with   Dizziness   Emesis   Headache   Weakness    Matthew [name] is a 51 y.o. male.  HPI  51 year old male with a history of acute kidney failure anxiety, bipolar disorder, COPD, depression, who presents to the emergency department today for evaluation of lightheadedness nausea and coughing.  Patient states he started feeling unwell last night with lightheadedness and near syncope.  He has had nausea and dry heaving but has not had any vomiting.  He is also been coughing since last night.  He denies any chest pain, shortness of breath or documented fevers at home.  Pt had tooth pulled 3 days ago and has been on abx for this as well .  Home Medications Prior to Admission medications   Medication Sig Start Date End Date Taking? Authorizing Provider  benzonatate (TESSALON) 100 MG capsule Take 1 capsule (100 mg total) by mouth every 8 (eight) hours for 5 days. 08/31/21 09/05/21 Yes Rawleigh Rode S, PA-C  ondansetron (ZOFRAN) 4 MG tablet Take 1 tablet (4 mg total) by mouth every 6 (six) hours. 08/31/21  Yes Alianny Toelle S, PA-C  acetaminophen-codeine (TYLENOL #3) 300-30 MG tablet Take 1 tablet by mouth every 6 (six) hours as needed for severe oral pain 08/29/21     amoxicillin (AMOXIL) 500 MG capsule TAKE 1 CAPSULE 3 TIMES DAILY UNTIL GONE. Patient not taking: Reported on 05/03/2021 10/26/20 10/26/21  Tamela Oddi, DDS  atomoxetine (STRATTERA) 40 MG capsule Take 1 capsule (40 mg total) by mouth 2 (two) times daily with a meal. 07/19/21   Charlcie Cradle, MD  azithromycin (ZITHROMAX Z-PAK) 250 MG tablet TAKE 2 TABLETS ON DAY 1 THEN TAKE 1 TABLET A DAY FOR 4 DAYS. 08/29/21     azithromycin (ZITHROMAX) 250 MG tablet TAKE 2 TABLETS BY MOUTH ON DAY 1 THEN TAKE 1 TABLET DAILY FOR THE NEXT 4 DAYS. Patient not taking:  Reported on 05/03/2021 10/05/20 10/05/21  Darien Ramus, MD  baclofen (LIORESAL) 10 MG tablet Take 1 tablet (10 mg total) by mouth 3 (three) times daily as needed for muscle spasms. 03/09/19   Midge Minium, MD  chlorhexidine (PERIDEX) 0.12 % solution RINSE MOUTH WITH 15ML (1 CAPFUL) FOR 30 SECONDS AM AND PM AFTER TOOTHBRUSHING. EXPECTORATE AFTER RINSING, DO NOT SWALLOW Patient not taking: Reported on 05/03/2021 10/26/20 10/26/21  Tamela Oddi, DDS  chlorhexidine (PERIDEX) 0.12 % solution Rinse mouth with 15 ML (1 capful) for 30 seconds in the morning and evening after toothbrushing. Spit out after rinsing, Do not swallow. 08/29/21     cyclobenzaprine (FLEXERIL) 10 MG tablet Take 1 tablet (10 mg total) by mouth 3 (three) times daily as needed. Patient not taking: Reported on 07/19/2021 06/26/12   Jearld Fenton, NP  dexamethasone (DECADRON) 4 MG tablet TAKE 1 TABLET BY MOUTH THREE TIMES DAILY Patient not taking: Reported on 05/03/2021 10/26/20 10/26/21  Tamela Oddi, DDS  divalproex (DEPAKOTE ER) 250 MG 24 hr tablet Take 2 tablets (500 mg total) by mouth at bedtime. 07/19/21   Charlcie Cradle, MD  ibuprofen (ADVIL) 400 MG tablet TAKE 1 TABLET EVERY 4 HOURS AS NEEDED FOR PAIN. NO MORE THAN 6 TABLETS PER DAY. 10/26/20 10/26/21  Tamela Oddi, DDS  ibuprofen (ADVIL) 600 MG tablet Take 1 tablet (600 mg total) by mouth every 6-8 hours  as needed for oral pain. Take with food 08/29/21     influenza vac split quadrivalent PF (FLUARIX) 0.5 ML injection Inject 0.5 mLs into the muscle. 06/18/21   Carlyle Basques, MD  predniSONE (DELTASONE) 20 MG tablet TAKE 3 TABLETS BY MOUTH DAILY Patient not taking: Reported on 05/03/2021 10/05/20 10/05/21  Darien Ramus, MD  tadalafil (CIALIS) 10 MG tablet Take 1 tablet (10 mg total) by mouth daily as needed for erectile dysfunction. 02/08/21   Charlcie Cradle, MD  valACYclovir (VALTREX) 1000 MG tablet Take 2 tablets (2,000 mg total) by mouth 2 (two) times  daily. 12/18/20   Midge Minium, MD  venlafaxine XR (EFFEXOR-XR) 75 MG 24 hr capsule Take 3 capsules (225 mg total) by mouth daily with breakfast. 07/19/21   Charlcie Cradle, MD      Allergies    Ciprofloxacin    Review of Systems   Review of Systems  Constitutional:  Negative for fever.  HENT:  Negative for ear pain and sore throat.   Respiratory:  Positive for cough. Negative for shortness of breath.   Cardiovascular:  Negative for chest pain.  Gastrointestinal:  Positive for nausea. Negative for abdominal pain and vomiting.  Genitourinary:  Negative for dysuria and hematuria.  Musculoskeletal:  Negative for back pain.  Skin:  Negative for rash.  Neurological:  Positive for light-headedness and headaches.  All other systems reviewed and are negative.  Physical Exam Updated Vital Signs BP (!) 135/104    Pulse 87    Temp 99.4 F (37.4 C) (Oral)    Resp 16    Ht 6\' 5"  (1.956 m)    Wt 97.5 kg    SpO2 96%    BMI 25.50 kg/m  Physical Exam Vitals and nursing note reviewed.  Constitutional:      General: He is not in acute distress.    Appearance: He is well-developed.  HENT:     Head: Normocephalic and atraumatic.     Mouth/Throat:     Mouth: Mucous membranes are dry.  Eyes:     Conjunctiva/sclera: Conjunctivae normal.  Cardiovascular:     Rate and Rhythm: Normal rate and regular rhythm.     Heart sounds: No murmur heard. Pulmonary:     Breath sounds: Normal breath sounds.     Comments: hyperventilating Abdominal:     General: Bowel sounds are normal.     Palpations: Abdomen is soft.     Tenderness: There is no abdominal tenderness.  Musculoskeletal:        General: No swelling.     Cervical back: Neck supple.  Skin:    General: Skin is warm and dry.     Capillary Refill: Capillary refill takes less than 2 seconds.  Neurological:     Mental Status: He is alert.     Comments: Mental Status:  Alert, thought content appropriate, able to give a coherent history.  Speech fluent without evidence of aphasia. Able to follow 2 step commands without difficulty.  Cranial Nerves:  II: pupils equal, round, reactive to light [name],IV, VI: ptosis not present, extra-ocular motions intact bilaterally  V,VII: smile symmetric, facial light touch sensation equal VIII: hearing grossly normal to voice  X: uvula elevates symmetrically  XI: bilateral shoulder shrug symmetric and strong XII: midline tongue extension without fassiculations Motor:  Normal tone. 5/5 strength of BUE and BLE major muscle groups including strong and equal grip strength and dorsiflexion/plantar flexion Sensory: light touch normal in all extremities.   Psychiatric:  Mood and Affect: Mood normal.    ED Results / Procedures / Treatments   Labs (all labs ordered are listed, but only abnormal results are displayed) Labs Reviewed  COMPREHENSIVE METABOLIC PANEL - Abnormal; Notable for the following components:      Result Value   Glucose, Bld 126 (*)    ALT 45 (*)    All other components within normal limits  CBC WITH DIFFERENTIAL/PLATELET - Abnormal; Notable for the following components:   MCHC 36.2 (*)    All other components within normal limits  BLOOD GAS, VENOUS - Abnormal; Notable for the following components:   pH, Ven 7.450 (*)    pCO2, Ven 33.6 (*)    pO2, Ven 87.5 (*)    All other components within normal limits  RAPID URINE DRUG SCREEN, HOSP PERFORMED - Abnormal; Notable for the following components:   Opiates POSITIVE (*)    Tetrahydrocannabinol POSITIVE (*)    All other components within normal limits  CBG MONITORING, ED - Abnormal; Notable for the following components:   Glucose-Capillary 142 (*)    All other components within normal limits  RESP PANEL BY RT-PCR (FLU A&B, COVID) ARPGX2  ETHANOL  URINALYSIS, ROUTINE W REFLEX MICROSCOPIC  LIPASE, BLOOD    EKG EKG Interpretation  Date/Time:  Friday August 31 2021 13:54:12 EST Ventricular Rate:  86 PR  Interval:  129 QRS Duration: 90 QT Interval:  373 QTC Calculation: 447 R Axis:   78 Text Interpretation: Sinus rhythm Right atrial enlargement Baseline wander in lead(s) I [name] aVL Confirmed by Octaviano Glow [phone]) on 08/31/2021 2:14:20 PM  Radiology CT Head Wo Contrast  Result Date: 08/31/2021 CLINICAL DATA:  Sudden onset headache. EXAM: CT HEAD WITHOUT CONTRAST TECHNIQUE: Contiguous axial images were obtained from the base of the skull through the vertex without intravenous contrast. RADIATION DOSE REDUCTION: This exam was performed according to the departmental dose-optimization program which includes automated exposure control, adjustment of the mA and/or kV according to patient size and/or use of iterative reconstruction technique. COMPARISON:  CT head dated January 28, 2012. FINDINGS: Brain: No evidence of acute infarction, hemorrhage, hydrocephalus, extra-axial collection or mass lesion/mass effect. 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 Vascular: No hyperdense vessel or unexpected calcification. Skull: Normal. Negative for fracture or focal lesion. Sinuses/Orbits: No acute finding. Other: None. IMPRESSION: 1. No acute intracranial abnormality. Electronically Signed   By: Titus Dubin M.D.   On: 08/31/2021 15:19   DG CHEST PORT 1 VIEW  Result Date: 08/31/2021 CLINICAL DATA:  Dizziness, headache, nausea, vomiting EXAM: PORTABLE CHEST 1 VIEW COMPARISON:  08/28/2018 FINDINGS: The heart size and mediastinal contours are within normal limits. Both lungs are clear. The visualized skeletal structures are unremarkable. IMPRESSION: No active disease. Electronically Signed   By: Kathreen Devoid M.D.   On: 08/31/2021 14:33    Procedures Procedures   7:56 PM Cardiac monitoring reveals NSR, HR 90s (Rate & rhythm), as reviewed and interpreted by me. Cardiac monitoring was ordered due to lightheadedness and to monitor patient for dysrhythmia.   Medications  Ordered in ED Medications  sodium chloride 0.9 % bolus 1,000 mL (0 mLs Intravenous Stopped 08/31/21 1814)  ondansetron (ZOFRAN-ODT) disintegrating tablet 4 mg (4 mg Oral Given 08/31/21 1429)  acetaminophen (TYLENOL) tablet 650 mg (650 mg Oral Given 08/31/21 1618)  LORazepam (ATIVAN) injection 0.5 mg (0.5 mg Intravenous Given 08/31/21 1618)  sodium chloride 0.9 % bolus 1,000 mL (1,000 mLs Intravenous New Bag/Given 08/31/21 1853)  ibuprofen (ADVIL) tablet 400 mg (400 mg Oral Given 08/31/21 1942)  HYDROcodone-acetaminophen (NORCO/VICODIN) 5-325 MG per tablet 1 tablet (1 tablet Oral Given 08/31/21 1942)    ED Course/ Medical Decision Making/ A&P                           Medical Decision Making  51 y/o male presenting for eval of lightheadedness, nausea, cough.   Reviewed/interpreted labs CBC wnl CMP  unremarkable Lipase neg ETOH neg UA neg for infection UDS + for opiates and THC, has been on pain meds for dental procedure COVID/flu neg  EKG - Sinus rhythm Right atrial enlargement Baseline wander in lead(s) I [name] aVL  Reviewed/interpreted imaging CXR - No active disease. CT head - 1. No acute intracranial abnormality.   5:10 PM Rechecked pt. He is sleeping comfortably in the room. He is easily arousable. He denies headache or dizziness. States he feels better when laying down. He has received tylenol, ativan, antiemetics and ivf.  He also received pain medications for his dental pain as he has not been able to take his home meds today.  On reassessment patient is sitting up in bed.  He has been able to tolerate soda, crackers and states he is feeling much improved from when he initially arrived to the ED.  He has had no further episodes of vomiting.  No longer feels lightheaded or dizzy and feels ready to be discharged home.  Denies any abdominal tenderness.  His vital signs are reassuring.  I suspect that he may have a viral infection.  We will treat him supportively with cough medication we  will also give Zofran for his nausea and vomiting.  Have lower suspicion for any emergent cause of symptoms that would require further work-up or admission.  Have advised that he monitor symptoms closely and return to the ED for any new or worsening symptoms.  All questions answered.  Patient stable for discharge.  Final Clinical Impression(s) / ED Diagnoses Final diagnoses:  Cough  Nausea and vomiting, unspecified vomiting type    Rx / DC Orders ED Discharge Orders          Ordered    ondansetron (ZOFRAN) 4 MG tablet  Every 6 hours        08/31/21 1952    benzonatate (TESSALON) 100 MG capsule  Every 8 hours        08/31/21 1955              Rodney Booze, PA-C 08/31/21 1956    Rodney Booze, PA-C 08/31/21 1958    Wyvonnia Dusky, MD 09/01/21 [phone]

## 2021-08-31 NOTE — ED Provider Triage Note (Signed)
Emergency Medicine Provider Triage Evaluation Note  Matthew [name] , a 51 y.o. male  was evaluated in triage.  Pt complains of lightheadedness, nv.  Review of Systems  Positive: Lightheaded, nv, ha Negative: Vision changes, numbness, weakness, abd pain  Physical Exam  BP (!) 153/101 (BP Location: Right Arm)    Pulse 93    Temp 99.4 F (37.4 C) (Oral)    Resp 18    Ht 6\' 5"  (1.956 m)    Wt 97.5 kg    SpO2 100%    BMI 25.50 kg/m  Gen:   Awake, no distress   Resp:  Normal effort  MSK:   Moves extremities without difficulty  Other:  Alert, answering questions appropriately, no facial droop  Medical Decision Making  Medically screening exam initiated at 2:16 PM.  Appropriate orders placed.  [name] was informed that the remainder of the evaluation will be completed by another provider, this initial triage assessment does not replace that evaluation, and the importance of remaining in the ED until their evaluation is complete.     Matthew Darby, Karrie Meres 08/31/21 1958

## 2021-08-31 NOTE — ED Notes (Signed)
Patient offered beef broth and crackers.

## 2021-09-01 ENCOUNTER — Other Ambulatory Visit (HOSPITAL_COMMUNITY): Payer: Self-pay

## 2021-09-03 ENCOUNTER — Other Ambulatory Visit (HOSPITAL_COMMUNITY): Payer: Self-pay

## 2021-09-10 ENCOUNTER — Other Ambulatory Visit (HOSPITAL_COMMUNITY): Payer: Self-pay | Admitting: Psychiatry

## 2021-09-10 ENCOUNTER — Other Ambulatory Visit (HOSPITAL_COMMUNITY): Payer: Self-pay

## 2021-09-10 DIAGNOSIS — F3181 Bipolar II disorder: Secondary | ICD-10-CM

## 2021-09-10 DIAGNOSIS — F411 Generalized anxiety disorder: Secondary | ICD-10-CM

## 2021-09-10 DIAGNOSIS — F410 Panic disorder [episodic paroxysmal anxiety] without agoraphobia: Secondary | ICD-10-CM

## 2021-09-11 ENCOUNTER — Other Ambulatory Visit (HOSPITAL_COMMUNITY): Payer: Self-pay

## 2021-09-12 ENCOUNTER — Other Ambulatory Visit (HOSPITAL_COMMUNITY): Payer: Self-pay

## 2021-09-12 ENCOUNTER — Other Ambulatory Visit (HOSPITAL_COMMUNITY): Payer: Self-pay | Admitting: Psychiatry

## 2021-09-12 DIAGNOSIS — F410 Panic disorder [episodic paroxysmal anxiety] without agoraphobia: Secondary | ICD-10-CM

## 2021-09-12 DIAGNOSIS — F3181 Bipolar II disorder: Secondary | ICD-10-CM

## 2021-09-12 DIAGNOSIS — F411 Generalized anxiety disorder: Secondary | ICD-10-CM

## 2021-09-13 ENCOUNTER — Other Ambulatory Visit: Payer: Self-pay

## 2021-09-13 ENCOUNTER — Telehealth (HOSPITAL_BASED_OUTPATIENT_CLINIC_OR_DEPARTMENT_OTHER): Payer: 59 | Admitting: Psychiatry

## 2021-09-13 ENCOUNTER — Other Ambulatory Visit (HOSPITAL_COMMUNITY): Payer: Self-pay

## 2021-09-13 DIAGNOSIS — F3181 Bipolar II disorder: Secondary | ICD-10-CM

## 2021-09-13 DIAGNOSIS — R41840 Attention and concentration deficit: Secondary | ICD-10-CM | POA: Diagnosis not present

## 2021-09-13 DIAGNOSIS — F410 Panic disorder [episodic paroxysmal anxiety] without agoraphobia: Secondary | ICD-10-CM | POA: Diagnosis not present

## 2021-09-13 DIAGNOSIS — F411 Generalized anxiety disorder: Secondary | ICD-10-CM | POA: Diagnosis not present

## 2021-09-13 MED ORDER — LAMOTRIGINE 25 MG PO TABS
ORAL_TABLET | ORAL | 0 refills | Status: DC
  Filled 2021-09-13: qty 74, 44d supply, fill #0

## 2021-09-13 MED ORDER — ATOMOXETINE HCL 40 MG PO CAPS
40.0000 mg | ORAL_CAPSULE | Freq: Two times a day (BID) | ORAL | 0 refills | Status: DC
  Filled 2021-09-13: qty 360, 180d supply, fill #0

## 2021-09-13 MED ORDER — VENLAFAXINE HCL ER 75 MG PO CP24
225.0000 mg | ORAL_CAPSULE | Freq: Every day | ORAL | 0 refills | Status: DC
  Filled 2021-09-13 – 2021-09-19 (×3): qty 270, 90d supply, fill #0

## 2021-09-13 MED ORDER — BUSPIRONE HCL 15 MG PO TABS
15.0000 mg | ORAL_TABLET | Freq: Every day | ORAL | 0 refills | Status: DC
  Filled 2021-09-13: qty 90, 90d supply, fill #0

## 2021-09-13 NOTE — Progress Notes (Signed)
Virtual Visit via Video Note  I connected with Matthew [name] on 09/13/21 at  8:00 AM EST by a video enabled telemedicine application and verified that I am speaking with the correct person using two identifiers.  Location: Patient:  home Provider: office   I discussed the limitations of evaluation and management by telemedicine and the availability of in person appointments. The patient expressed understanding and agreed to proceed.  History of Present Illness: Matthew [name] shares he is doing well. The holidays were good. His home life is getting better. He and his wife have had some arguments but have worked things out. Work remains stressful. Matthew [name] has had some conversations with his co-workers about his frustrations. It helped him to vent but nothing has really changed at work. His anxiety is manageable and mostly situational at work. Some days are worse than others. Matthew [name] has only had 1 panic attack since our last visit in early December. He has used Ativan a couple of times and it worked well. The depression is tied to the anxiety. Over the last 2 weeks he has not been depressed at all. He is active and social. He denies anhedonia and hopelessness. Matthew [name] denies any manic and hypomanic like symptoms. He is sleeping well. Matthew [name] denies SI/HI. He has not been taking Depakote. He is taking Buspar at bedtime.    Observations/Objective: Psychiatric Specialty Exam: ROS  There were no vitals taken for this visit.There is no height or weight on file to calculate BMI.  General Appearance: Neat and Well Groomed  Eye Contact:  Good  Speech:  Clear and Coherent and Normal Rate  Volume:  Normal  Mood:  Euthymic  Affect:  Full Range  Thought Process:  Goal Directed, Linear, and Descriptions of Associations: Intact  Orientation:  Full (Time, Place, and Person)  Thought Content:  Logical  Suicidal Thoughts:  No  Homicidal Thoughts:  No  Memory:  Immediate;   Good  Judgement:  Good  Insight:  Good   Psychomotor Activity:  Normal  Concentration:  Concentration: Good  Recall:  Good  Fund of Knowledge:  Good  Language:  Good  Akathisia:  No  Handed:  Right  AIMS (if indicated):     Assets:  Communication Skills Desire for Improvement Financial Resources/Insurance Housing Intimacy Leisure Time Resilience Social Support Talents/Skills Transportation Vocational/Educational  ADL's:  Intact  Cognition:  WNL  Sleep:        Assessment and Plan: Depression screen [facility] 2/9 09/13/2021 05/03/2021 02/08/2021 11/16/2020 04/07/2020  Decreased Interest 0 0 0 0 0  Down, Depressed, Hopeless 0 0 0 1 0  PHQ - 2 Score 0 0 0 1 0  Altered sleeping - - - - 0  Tired, decreased energy - - - - 0  Change in appetite - - - - 0  Feeling bad or failure about yourself  - - - - 0  Trouble concentrating - - - - 0  Moving slowly or fidgety/restless - - - - 0  Suicidal thoughts - - - - 0  PHQ-9 Score - - - - 0  Difficult doing work/chores - - - - Not difficult at all    Flowsheet Row Video Visit from 09/13/2021 in [facility] from 08/31/2021 in [facility] Video Visit from 05/03/2021 in [facility]  C-SSRS RISK CATEGORY No Risk No Risk No Risk      D/c Depakote  Start trial of Lamictal 25mg  po  qD x 14 days then increase to 50mg  qD x14 days. Plan to titrate to 100mg .   Restart Buspar 15mg  po qHS  No refill on Ativan  We talked about the importance treatment with a mood stabalizer for Bipolar disorder and the risk of flipping into a manic episode with treatment with an antidepressant alone. Matthew [name] verbalized understanding and is willing to try Lamictal.      1. GAD (generalized anxiety disorder) - venlafaxine XR (EFFEXOR-XR) 75 MG 24 hr capsule; Take 3 capsules (225 mg total) by mouth daily with breakfast.  Dispense: 270 capsule; Refill: 0  2. Panic 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 -  venlafaxine XR (EFFEXOR-XR) 75 MG 24 hr capsule; Take 3 capsules (225 mg total) by mouth daily with breakfast.  Dispense: 270 capsule; Refill: 0  3. Poor concentration - atomoxetine (STRATTERA) 40 MG capsule; Take 1 capsule (40 mg total) by mouth 2 (two) times daily with a meal.  Dispense: 360 capsule; Refill: 0  4. Bipolar 2 disorder (HCC) - atomoxetine (STRATTERA) 40 MG capsule; Take 1 capsule (40 mg total) by mouth 2 (two) times daily with a meal.  Dispense: 360 capsule; Refill: 0 - venlafaxine XR (EFFEXOR-XR) 75 MG 24 hr capsule; Take 3 capsules (225 mg total) by mouth daily with breakfast.  Dispense: 270 capsule; Refill: 0    Follow Up Instructions: In 4-6 weeks or sooner if needed   I discussed the assessment and treatment plan with the patient. The patient was provided an opportunity to ask questions and all were answered. The patient agreed with the plan and demonstrated an understanding of the instructions.   The patient was advised to call back or seek an in-person evaluation if the symptoms worsen or if the condition fails to improve as anticipated.  I provided 21 minutes of non-face-to-face time during this encounter.   , MD

## 2021-09-14 ENCOUNTER — Other Ambulatory Visit (HOSPITAL_COMMUNITY): Payer: Self-pay

## 2021-09-19 ENCOUNTER — Other Ambulatory Visit (HOSPITAL_COMMUNITY): Payer: Self-pay

## 2021-10-18 ENCOUNTER — Other Ambulatory Visit: Payer: Self-pay

## 2021-10-18 ENCOUNTER — Telehealth (HOSPITAL_BASED_OUTPATIENT_CLINIC_OR_DEPARTMENT_OTHER): Payer: 59 | Admitting: Psychiatry

## 2021-10-18 ENCOUNTER — Other Ambulatory Visit (HOSPITAL_COMMUNITY): Payer: Self-pay

## 2021-10-18 ENCOUNTER — Encounter (HOSPITAL_COMMUNITY): Payer: Self-pay | Admitting: Psychiatry

## 2021-10-18 DIAGNOSIS — F3181 Bipolar II disorder: Secondary | ICD-10-CM | POA: Diagnosis not present

## 2021-10-18 DIAGNOSIS — R41840 Attention and concentration deficit: Secondary | ICD-10-CM | POA: Diagnosis not present

## 2021-10-18 DIAGNOSIS — F411 Generalized anxiety disorder: Secondary | ICD-10-CM | POA: Diagnosis not present

## 2021-10-18 DIAGNOSIS — F410 Panic disorder [episodic paroxysmal anxiety] without agoraphobia: Secondary | ICD-10-CM

## 2021-10-18 MED ORDER — LITHIUM CARBONATE ER 300 MG PO TBCR
300.0000 mg | EXTENDED_RELEASE_TABLET | Freq: Every day | ORAL | 0 refills | Status: DC
  Filled 2021-10-18: qty 90, 90d supply, fill #0

## 2021-10-18 MED ORDER — VENLAFAXINE HCL ER 75 MG PO CP24
225.0000 mg | ORAL_CAPSULE | Freq: Every day | ORAL | 0 refills | Status: DC
  Filled 2021-10-18: qty 270, 90d supply, fill #0

## 2021-10-18 MED ORDER — ATOMOXETINE HCL 40 MG PO CAPS
40.0000 mg | ORAL_CAPSULE | Freq: Two times a day (BID) | ORAL | 0 refills | Status: DC
  Filled 2021-10-18: qty 180, 90d supply, fill #0

## 2021-10-18 MED ORDER — BUSPIRONE HCL 15 MG PO TABS
ORAL_TABLET | ORAL | 0 refills | Status: DC
Start: 2021-10-18 — End: 2021-12-20
  Filled 2021-10-18: qty 135, 90d supply, fill #0

## 2021-10-18 NOTE — Progress Notes (Signed)
Virtual Visit via Video Note ? ?I connected with Matthew [name] on 10/18/21 at  8:00 AM EST by a video enabled telemedicine application and verified that I am speaking with the correct person using two identifiers. ? ?Location: ?Patient: home ?Provider: office ?  ?I discussed the limitations of evaluation and management by telemedicine and the availability of in person appointments. The patient expressed understanding and agreed to proceed. ? ?History of Present Illness: ?"Been alright". Work has been very busy. [employer] recently let go of a lot of positions due to budget. Matthew [name] found out he was on the list to be fired but was somehow spared. He also received a bad evaluation. Matthew [name] is trying to do his job well but admits it's been hard since all this happened. Matthew [name] has been having some depression due to stressors.. He states it started right after started the Lamictal. Matthew [name] took it for 2 weeks and was experiencing passive SI and low motivation. It resolved when he stopped Lamictal. Today he denies any SI/HI in over 2 weeks. He states the same happened with Depakote. He denies irritability. Pt denies recent manic and hypomanic symptoms including periods of decreased need for sleep, increased energy, mood lability, impulsivity, FOI, and excessive spending. His anxiety is significantly increased. He has had at least 4 stress induced panic attacks since late January. He has been taking Buspar BID and he has taken Ativan on/off. He is getting about 6 hrs of broken sleep. Energy is fine.  ? ?  ?Observations/Objective: ?Psychiatric Specialty Exam: ?ROS  ?There were no vitals taken for this visit.There is no height or weight on file to calculate BMI.  ?General Appearance: Fairly Groomed and Neat  ?Eye Contact:  Good  ?Speech:  Clear and Coherent and Normal Rate  ?Volume:  Normal  ?Mood:  Anxious and Depressed  ?Affect:  Congruent  ?Thought Process:  Goal Directed, Linear, and Descriptions of Associations: Intact   ?Orientation:  Full (Time, Place, and Person)  ?Thought Content:  Logical  ?Suicidal Thoughts:  No  ?Homicidal Thoughts:  No  ?Memory:  Immediate;   Good  ?Judgement:  Good  ?Insight:  Good  ?Psychomotor Activity:  Normal  ?Concentration:  Concentration: Good  ?Recall:  Good  ?Fund of Knowledge:  Good  ?Language:  Good  ?Akathisia:  No  ?Handed:  Right  ?AIMS (if indicated):     ?Assets:  Communication Skills ?Desire for Improvement ?Financial Resources/Insurance ?Housing ?Intimacy ?Resilience ?Social Support ?Talents/Skills ?Transportation ?Vocational/Educational  ?ADL's:  Intact  ?Cognition:  WNL  ?Sleep:     ? ? ? ?Assessment and Plan: ? ?Depression screen [facility] 2/9 10/18/2021 09/13/2021 05/03/2021 02/08/2021 11/16/2020  ?Decreased Interest 0 0 0 0 0  ?Down, Depressed, Hopeless 1 0 0 0 1  ?PHQ - 2 Score 1 0 0 0 1  ?Altered sleeping - - - - -  ?Tired, decreased energy - - - - -  ?Change in appetite - - - - -  ?Feeling bad or failure about yourself  - - - - -  ?Trouble concentrating - - - - -  ?Moving slowly or fidgety/restless - - - - -  ?Suicidal thoughts - - - - -  ?PHQ-9 Score - - - - -  ?Difficult doing work/chores - - - - -  ? ? ?Flowsheet Row Video Visit from 10/18/2021 in [facility] Video Visit from 09/13/2021 in [facility] ED from 08/31/2021 in [place] [facility]  ?  C-SSRS RISK CATEGORY No Risk No Risk No Risk  ? ?  ? ?Increase Buspar  ? ?D/c Lamictal  ? ?Start Lithium for mood stabilization ? ?1. GAD (generalized anxiety disorder) ?- venlafaxine XR (EFFEXOR-XR) 75 MG 24 hr capsule; Take 3 capsules (225 mg total) by mouth daily with breakfast.  Dispense: 270 capsule; Refill: 0 ?- busPIRone (BUSPAR) 15 MG tablet; Take 0.5 tablets (7.5 mg total) by mouth daily with breakfast AND 1 tablet (15 mg total) at bedtime.  Dispense: 135 tablet; Refill: 0 ? ?2. Bipolar 2 disorder (HCC) ?- atomoxetine (STRATTERA) 40 MG  capsule; Take 1 capsule (40 mg total) by mouth 2 (two) times daily with a meal.  Dispense: 360 capsule; Refill: 0 ?- venlafaxine XR (EFFEXOR-XR) 75 MG 24 hr capsule; Take 3 capsules (225 mg total) by mouth daily with breakfast.  Dispense: 270 capsule; Refill: 0 ?- lithium carbonate (LITHOBID) 300 MG CR tablet; Take 1 tablet (300 mg total) by mouth daily.  Dispense: 90 tablet; Refill: 0 ? ?3. Panic 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 ?- venlafaxine XR (EFFEXOR-XR) 75 MG 24 hr capsule; Take 3 capsules (225 mg total) by mouth daily with breakfast.  Dispense: 270 capsule; Refill: 0 ? ?4. Poor concentration ?- atomoxetine (STRATTERA) 40 MG capsule; Take 1 capsule (40 mg total) by mouth 2 (two) times daily with a meal.  Dispense: 360 capsule; Refill: 0 ? ? ?Follow Up Instructions: ?In 1-2  months or sooner if needed ?  ?I discussed the assessment and treatment plan with the patient. The patient was provided an opportunity to ask questions and all were answered. The patient agreed with the plan and demonstrated an understanding of the instructions. ?  ?The patient was advised to call back or seek an in-person evaluation if the symptoms worsen or if the condition fails to improve as anticipated. ? ?I provided 23 minutes of non-face-to-face time during this encounter. ? ? ?Charlcie Cradle, MD ? ?

## 2021-10-19 ENCOUNTER — Other Ambulatory Visit (HOSPITAL_COMMUNITY): Payer: Self-pay

## 2021-10-29 ENCOUNTER — Other Ambulatory Visit (HOSPITAL_COMMUNITY): Payer: Self-pay

## 2021-11-15 ENCOUNTER — Other Ambulatory Visit (HOSPITAL_COMMUNITY): Payer: Self-pay

## 2021-11-15 ENCOUNTER — Telehealth (HOSPITAL_BASED_OUTPATIENT_CLINIC_OR_DEPARTMENT_OTHER): Payer: 59 | Admitting: Psychiatry

## 2021-11-15 DIAGNOSIS — F411 Generalized anxiety disorder: Secondary | ICD-10-CM

## 2021-11-15 DIAGNOSIS — F99 Mental disorder, not otherwise specified: Secondary | ICD-10-CM

## 2021-11-15 DIAGNOSIS — F5105 Insomnia due to other mental disorder: Secondary | ICD-10-CM | POA: Diagnosis not present

## 2021-11-15 DIAGNOSIS — F3181 Bipolar II disorder: Secondary | ICD-10-CM | POA: Diagnosis not present

## 2021-11-15 MED ORDER — QUETIAPINE FUMARATE 100 MG PO TABS
100.0000 mg | ORAL_TABLET | Freq: Every day | ORAL | 0 refills | Status: DC
  Filled 2021-11-15: qty 90, 90d supply, fill #0

## 2021-11-15 NOTE — Progress Notes (Signed)
Virtual Visit via Video Note ? ?I connected with Matthew [name] on 11/15/21 at  8:00 AM EDT by a video enabled telemedicine application and verified that I am speaking with the correct person using two identifiers. ? ?Location: ?Patient: home ?Provider: office ?  ?I discussed the limitations of evaluation and management by telemedicine and the availability of in person appointments. The patient expressed understanding and agreed to proceed. ? ?History of Present Illness: ?[name] shares that he stopped taking Lithium 1 week ago due to worsening depression and SI. He tried to take it twice for a couple of days and had the same experience each time. The depression stabilized and improved after stopping the Lithium and SI resolved.  He denies anhedonia and hopelessness. He denies SI/HI. [name] denies manic and hypomanic like symptoms. Work is very stressful and he has anxiety due to it. He has had 4 panic attacks in the last few weeks. He took Ativan a couple of times and it helped. He prefers to use coping skills for panic attacks when they occur. He is getting about 4 hrs sleep each night. It takes him a while to fall asleep and stay asleep.  ?  ?Observations/Objective: ?Psychiatric Specialty Exam: ?ROS  ?There were no vitals taken for this visit.There is no height or weight on file to calculate BMI.  ?General Appearance: Casual and Fairly Groomed  ?Eye Contact:  Good  ?Speech:  Clear and Coherent and Normal Rate  ?Volume:  Normal  ?Mood:  Euthymic  ?Affect:  Full Range  ?Thought Process:  Goal Directed, Linear, and Descriptions of Associations: Intact  ?Orientation:  Full (Time, Place, and Person)  ?Thought Content:  Logical  ?Suicidal Thoughts:  No  ?Homicidal Thoughts:  No  ?Memory:  Immediate;   Good  ?Judgement:  Good  ?Insight:  Good  ?Psychomotor Activity:  Normal  ?Concentration:  Concentration: Good  ?Recall:  Good  ?Fund of Knowledge:  Good  ?Language:  Good  ?Akathisia:  No  ?Handed:  Right  ?AIMS (if  indicated):     ?Assets:  Communication Skills ?Desire for Improvement ?Financial Resources/Insurance ?Housing ?Intimacy ?Leisure Time ?Resilience ?Social Support ?Talents/Skills ?Transportation ?Vocational/Educational  ?ADL's:  Intact  ?Cognition:  WNL  ?Sleep:     ? ? ? ?Assessment and Plan: ? ?  11/15/2021  ?  8:06 AM 10/18/2021  ?  8:15 AM 09/13/2021  ?  8:10 AM 05/03/2021  ?  8:14 AM 02/08/2021  ?  8:25 AM  ?Depression screen PHQ 2/9  ?Decreased Interest 0 0 0 0 0  ?Down, Depressed, Hopeless 1 1 0 0 0  ?PHQ - 2 Score 1 1 0 0 0  ? ? ?Flowsheet Row [facility] Video Visit from 10/18/2021 in [facility] Visit from 09/13/2021 in [facility]  ?C-SSRS RISK CATEGORY No Risk No Risk No Risk  ? ?  ?We talked about the importance treatment with a mood stabalizer for Bipolar disorder and the risk of flipping into a manic episode with treatment with an antidepressant alone. He has not been able to tolerate Lamictal, Lithium or Depakote. He states each caused him to feel depressed so he stopped them. Sahan verbalized understanding and is willing to try Seroquel.    ? ?D/c Lithium- it made him more depressed ? ?Start Seroquel 100mg  po qHS for Bipolar 2 and insomnia. ? ?If overly sedating I will switch to Seroquel XR.  ? ?  No refill on Ativan - [name] states he has enough ? ?Continue Buspar, Effexor XR and Strattera.  ? ?1. Bipolar 2 disorder (HCC) ?- QUEtiapine (SEROQUEL) 100 MG tablet; Take 1 tablet (100 mg total) by mouth at bedtime.  Dispense: 90 tablet; Refill: 0 ? ?2. GAD (generalized anxiety disorder) ? ?3. Insomnia due to other mental disorder ?- QUEtiapine (SEROQUEL) 100 MG tablet; Take 1 tablet (100 mg total) by mouth at bedtime.  Dispense: 90 tablet; Refill: 0 ? ? ? ?Follow Up Instructions: ?In 1-2 months or sooner if needed ?  ? ?Collaboration of Care: Other none  today ? ?Patient/Guardian was advised Release of Information must be obtained prior to any record release in order to collaborate their care with an outside provider. Patient/Guardian was advised if they have not already done so to contact the registration department to sign all necessary forms in order for [place] to release information regarding their care.  ? ?Consent: Patient/Guardian gives verbal consent for treatment and assignment of benefits for services provided during this visit. Patient/Guardian expressed understanding and agreed to proceed.  ? ?I discussed the assessment and treatment plan with the patient. The patient was provided an opportunity to ask questions and all were answered. The patient agreed with the plan and demonstrated an understanding of the instructions. ?  ?The patient was advised to call back or seek an in-person evaluation if the symptoms worsen or if the condition fails to improve as anticipated. ? ?I provided 15 minutes of non-face-to-face time during this encounter. ? ? ?[name], MD ? ?

## 2021-11-22 ENCOUNTER — Telehealth (HOSPITAL_COMMUNITY): Payer: Self-pay | Admitting: *Deleted

## 2021-11-22 ENCOUNTER — Other Ambulatory Visit (HOSPITAL_COMMUNITY): Payer: Self-pay | Admitting: Psychiatry

## 2021-11-22 ENCOUNTER — Other Ambulatory Visit (HOSPITAL_COMMUNITY): Payer: Self-pay

## 2021-11-22 DIAGNOSIS — F411 Generalized anxiety disorder: Secondary | ICD-10-CM

## 2021-11-22 DIAGNOSIS — F410 Panic disorder [episodic paroxysmal anxiety] without agoraphobia: Secondary | ICD-10-CM

## 2021-11-22 DIAGNOSIS — F5105 Insomnia due to other mental disorder: Secondary | ICD-10-CM

## 2021-11-22 MED ORDER — LORAZEPAM 1 MG PO TABS
1.0000 mg | ORAL_TABLET | Freq: Three times a day (TID) | ORAL | 1 refills | Status: DC | PRN
Start: 2021-11-22 — End: 2021-12-20
  Filled 2021-11-22 – 2021-12-07 (×2): qty 90, 30d supply, fill #0

## 2021-11-22 NOTE — Telephone Encounter (Signed)
Pt called to share that Seroquel has not been helping with sleep at all. Pt wondering if Seroquel can be d/c and Ativan restarted. Pt has an appointment scheduled for 12/13/21. Please review. ?

## 2021-11-23 ENCOUNTER — Encounter: Payer: Self-pay | Admitting: Family Medicine

## 2021-11-30 ENCOUNTER — Other Ambulatory Visit (HOSPITAL_COMMUNITY): Payer: Self-pay

## 2021-12-07 ENCOUNTER — Other Ambulatory Visit (HOSPITAL_COMMUNITY): Payer: Self-pay

## 2021-12-10 ENCOUNTER — Other Ambulatory Visit (HOSPITAL_COMMUNITY): Payer: Self-pay

## 2021-12-13 ENCOUNTER — Telehealth (HOSPITAL_COMMUNITY): Payer: 59 | Admitting: Psychiatry

## 2021-12-13 ENCOUNTER — Telehealth (HOSPITAL_COMMUNITY): Payer: Self-pay | Admitting: Psychiatry

## 2021-12-13 NOTE — Telephone Encounter (Signed)
Patient was not present on video platform used through [employer]. I called the patient at our scheduled appointment time. There was no answer.and the recording states "this caller is not available". I was not able to leave a voice message.   ? ?

## 2021-12-14 ENCOUNTER — Telehealth (HOSPITAL_COMMUNITY): Payer: Self-pay

## 2021-12-14 NOTE — Telephone Encounter (Signed)
Patient called and stated he missed his appointment yesterday morning and that he needs something for sleep. Writer gave note to the girls at the [facility] to call and reschedule pt's appointment ?

## 2021-12-20 ENCOUNTER — Other Ambulatory Visit (HOSPITAL_COMMUNITY): Payer: Self-pay

## 2021-12-20 ENCOUNTER — Telehealth (HOSPITAL_BASED_OUTPATIENT_CLINIC_OR_DEPARTMENT_OTHER): Payer: 59 | Admitting: Psychiatry

## 2021-12-20 DIAGNOSIS — F5105 Insomnia due to other mental disorder: Secondary | ICD-10-CM

## 2021-12-20 DIAGNOSIS — F410 Panic disorder [episodic paroxysmal anxiety] without agoraphobia: Secondary | ICD-10-CM

## 2021-12-20 DIAGNOSIS — F3181 Bipolar II disorder: Secondary | ICD-10-CM | POA: Diagnosis not present

## 2021-12-20 DIAGNOSIS — F411 Generalized anxiety disorder: Secondary | ICD-10-CM | POA: Diagnosis not present

## 2021-12-20 DIAGNOSIS — R41840 Attention and concentration deficit: Secondary | ICD-10-CM

## 2021-12-20 DIAGNOSIS — F99 Mental disorder, not otherwise specified: Secondary | ICD-10-CM | POA: Diagnosis not present

## 2021-12-20 MED ORDER — ATOMOXETINE HCL 40 MG PO CAPS
40.0000 mg | ORAL_CAPSULE | Freq: Two times a day (BID) | ORAL | 0 refills | Status: DC
  Filled 2021-12-20: qty 180, 90d supply, fill #0
  Filled 2021-12-20 (×2): qty 90, 45d supply, fill #0
  Filled 2022-01-02: qty 180, 90d supply, fill #0

## 2021-12-20 MED ORDER — LORAZEPAM 1 MG PO TABS
1.0000 mg | ORAL_TABLET | Freq: Three times a day (TID) | ORAL | 0 refills | Status: DC | PRN
  Filled 2021-12-20 – 2022-02-08 (×2): qty 90, 30d supply, fill #0

## 2021-12-20 MED ORDER — BUSPIRONE HCL 15 MG PO TABS
ORAL_TABLET | ORAL | 0 refills | Status: DC
  Filled 2021-12-20 – 2022-01-02 (×2): qty 135, 90d supply, fill #0

## 2021-12-20 MED ORDER — VENLAFAXINE HCL ER 75 MG PO CP24
225.0000 mg | ORAL_CAPSULE | Freq: Every day | ORAL | 0 refills | Status: DC
  Filled 2021-12-20 – 2022-01-02 (×2): qty 270, 90d supply, fill #0

## 2021-12-20 NOTE — Progress Notes (Signed)
Virtual Visit via Video Note ? ?I connected with Matthew [name] on 12/20/21 at  8:00 AM EDT by a video enabled telemedicine application and verified that I am speaking with the correct person using two identifiers. ? ?Location: ?Patient: home ?Provider: office ?  ?I discussed the limitations of evaluation and management by telemedicine and the availability of in person appointments. The patient expressed understanding and agreed to proceed. ? ?History of Present Illness: ?Matthew [name] is endorsing increased stress. He is trying to engage in self care. Matthew [name] shares that his sleep is broken in 2-3 hours blocks thru out the night. His depression is "a lot better than before". As long as he keeps his anxiety well controlled then the depression is stable. His anxiety remains centered around his work projects and loads. He has a new boss who is overloading Matthew [name] with work. Matthew [name] has had multiple talks about this with his boss but it seems like his boss is not understanding. He has a meeting scheduled with his boss for this Monday. Matthew [name] has been thinking about what he wants to talk about over and over. It is difficult to stop himself even when he is doing other things. Matthew [name] is having more frequent stress induced panic attacks. They occur 1-2x/week. He takes Ativan sometimes and it remains effective. He uses coping skills first. Matthew [name] denies any manic and hypomanic like symptoms. He denies SI/HI. Matthew [name] is making an effort to get back into some hobbies. He is wanting to garden and doing some painting with his kid. His relationship with his wife has improved after some big talks/fights. Matthew [name] is no longer taking Lithium. He took it for 1 week but it made him feel like a "zombie". He stopped the Seroquel after 1 dose because he had a bad nightmare. ?  ?Observations/Objective: ?Psychiatric Specialty Exam: ?ROS  ?There were no vitals taken for this visit.There is no height or weight on file to calculate BMI.  ?General Appearance: Neat and Well  Groomed  ?Eye Contact:  Good  ?Speech:  Clear and Coherent and Normal Rate  ?Volume:  Normal  ?Mood:  Anxious  ?Affect:  Full Range  ?Thought Process:  Goal Directed, Linear, and Descriptions of Associations: Intact  ?Orientation:  Full (Time, Place, and Person)  ?Thought Content:  Logical  ?Suicidal Thoughts:  No  ?Homicidal Thoughts:  No  ?Memory:  Immediate;   Good  ?Judgement:  Good  ?Insight:  Good  ?Psychomotor Activity:  Normal  ?Concentration:  Concentration: Good  ?Recall:  Good  ?Fund of Knowledge:  Good  ?Language:  Good  ?Akathisia:  No  ?Handed:  Right  ?AIMS (if indicated):     ?Assets:  Communication Skills ?Desire for Improvement ?Financial Resources/Insurance ?Housing ?Intimacy ?Resilience ?Social Support ?Talents/Skills ?Transportation ?Vocational/Educational  ?ADL's:  Intact  ?Cognition:  WNL  ?Sleep:     ? ? ? ?Assessment and Plan: ? ? ?  11/15/2021  ?  8:06 AM 10/18/2021  ?  8:15 AM 09/13/2021  ?  8:10 AM 05/03/2021  ?  8:14 AM 02/08/2021  ?  8:25 AM  ?Depression screen PHQ 2/9  ?Decreased Interest 0 0 0 0 0  ?Down, Depressed, Hopeless 1 1 0 0 0  ?PHQ - 2 Score 1 1 0 0 0  ? ? ?Flowsheet Row Video Visit from [facility]15[facility] in [facility] Video Visit from 10/18/2021 in [facility] Video Visit from 09/13/2021 in [facility]  ?C-SSRS RISK  CATEGORY No Risk No Risk No Risk  ? ?  ? ? ?We talked about the importance of treatment with a mood stabilizer for individuals with Bipolar disorder. He is a risk of developing a manic episode without a mood stabilizer. He is currently only taking an antidepressant which increase the risk of mania. He has not been able to tolerate Lamictal, Lithium, Depakote or Seroquel. Each caused him to feel bad or "zombie" like so he stopped them after a week or so. Matthew [name] verbalized understanding regarding the risks. So we have decided to retry mood  mood stabilizers when  manic or hypomanic symptoms develop. He and his wife are monitoring for symptoms and will contact me when they notice any change. If he is unable to tolerate the mood stabilizers while symptomatic then we may consider ECT.  ? ?Status of current problems: increased anxiety due to work stress ? ?Meds:  ?D/c Lithium due to side effects of feeling like a "zombie"  ?D/c Seroquel due to SE of nightmares ?1. Bipolar 2 disorder (HCC) ?- atomoxetine (STRATTERA) 40 MG capsule; Take 1 capsule (40 mg total) by mouth 2 (two) times daily with a meal.  Dispense: 360 capsule; Refill: 0 ?- venlafaxine XR (EFFEXOR-XR) 75 MG 24 hr capsule; Take 3 capsules (225 mg total) by mouth daily with breakfast.  Dispense: 270 capsule; Refill: 0 ? ?2. Poor concentration ?- atomoxetine (STRATTERA) 40 MG capsule; Take 1 capsule (40 mg total) by mouth 2 (two) times daily with a meal.  Dispense: 360 capsule; Refill: 0 ? ?3. GAD (generalized anxiety disorder) ?- busPIRone (BUSPAR) 15 MG tablet; Take 0.5 tablets (7.5 mg total) by mouth daily with breakfast AND 1 tablet (15 mg total) at bedtime.  Dispense: 135 tablet; Refill: 0 ?- LORazepam (ATIVAN) 1 MG tablet; Take 1 tablet (1 mg total) by mouth every 8 (eight) hours as needed for anxiety.  Dispense: 90 tablet; Refill: 0 ?- venlafaxine XR (EFFEXOR-XR) 75 MG 24 hr capsule; Take 3 capsules (225 mg total) by mouth daily with breakfast.  Dispense: 270 capsule; Refill: 0 ? ?4. Panic 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 ?- LORazepam (ATIVAN) 1 MG tablet; Take 1 tablet (1 mg total) by mouth every 8 (eight) hours as needed for anxiety.  Dispense: 90 tablet; Refill: 0 ?- venlafaxine XR (EFFEXOR-XR) 75 MG 24 hr capsule; Take 3 capsules (225 mg total) by mouth daily with breakfast.  Dispense: 270 capsule; Refill: 0 ? ?5. Insomnia due to other mental disorder ?- LORazepam (ATIVAN) 1 MG tablet; Take 1 tablet (1 mg total) by mouth every 8 (eight) hours as needed for anxiety.  Dispense: 90 tablet; Refill: 0 ? ? ? ?Therapy:  brief supportive therapy provided. Discussed psychosocial stressors in detail.  Talked about the importance of self care.  ? ?Encouraged to follow up with PCP as needed ? ?Collaboration of Care: Other none today ? ?Patient/Guardian was advised Release of Information must be obtained prior to any record release in order to collaborate their care with an outside provider. Patient/Guardian was advised if they have not already done so to contact the registration department to sign all necessary forms in order for [place] to release information regarding their care.  ? ?Consent: Patient/Guardian gives verbal consent for treatment and assignment of benefits for services provided during this visit. Patient/Guardian expressed understanding and agreed to proceed.  ? ? ? ? ?Follow Up Instructions: ?Follow up in 2 months or sooner if needed ?  ? ?I discussed the assessment and treatment plan with the  patient. The patient was provided an opportunity to ask questions and all were answered. The patient agreed with the plan and demonstrated an understanding of the instructions. ?  ?The patient was advised to call back or seek an in-person evaluation if the symptoms worsen or if the condition fails to improve as anticipated. ? ?I provided 21 minutes of non-face-to-face time during this encounter. ? ? ?Oletta DarterSalina Feather Berrie, MD ? ?

## 2021-12-21 ENCOUNTER — Other Ambulatory Visit (HOSPITAL_COMMUNITY): Payer: Self-pay

## 2021-12-28 ENCOUNTER — Other Ambulatory Visit (HOSPITAL_COMMUNITY): Payer: Self-pay

## 2022-01-02 ENCOUNTER — Other Ambulatory Visit (HOSPITAL_COMMUNITY): Payer: Self-pay

## 2022-01-24 ENCOUNTER — Telehealth (HOSPITAL_BASED_OUTPATIENT_CLINIC_OR_DEPARTMENT_OTHER): Payer: 59 | Admitting: Psychiatry

## 2022-01-24 DIAGNOSIS — F410 Panic disorder [episodic paroxysmal anxiety] without agoraphobia: Secondary | ICD-10-CM | POA: Diagnosis not present

## 2022-01-24 DIAGNOSIS — F411 Generalized anxiety disorder: Secondary | ICD-10-CM | POA: Diagnosis not present

## 2022-01-24 DIAGNOSIS — F3181 Bipolar II disorder: Secondary | ICD-10-CM

## 2022-01-24 NOTE — Progress Notes (Signed)
Virtual Visit via Video Note  I connected with Matthew [name] on 01/24/22 at  4:15 PM EDT by a video enabled telemedicine application and verified that I am speaking with the correct person using two identifiers.  Location: Patient: at work  Provider: office   I discussed the limitations of evaluation and management by telemedicine and the availability of in person appointments. The patient expressed understanding and agreed to proceed.  History of Present Illness: [name] shares that he had a meeting that was very stressful s days ago. [name] feels like they don't trust him and it is making him frustrated and angry. Since that day he has been angry, irritable and thinking he needs to quit this job. He is anxious and has been taking Ativan. It helps for a few hours. He has been having thoughts of quitting and on/off SI without plan or intent. The SI resolved yesterday. He is depressed and is wondering if he needs ECT. He denies HI. [name] is not sleeping well and feels trapped all the time. He denies any impulsive behaviors recently. Home life is good..   Observations/Objective: Psychiatric Specialty Exam: ROS  There were no vitals taken for this visit.There is no height or weight on file to calculate BMI.  General Appearance: Fairly Groomed and Neat  Eye Contact:  Good  Speech:  Clear and Coherent and Normal Rate  Volume:  Normal  Mood:  Anxious and Depressed  Affect:  Congruent and irritable  Thought Process:  Coherent and Descriptions of Associations: Intact  Orientation:  Full (Time, Place, and Person)  Thought Content:  Rumination  Suicidal Thoughts:  No  Homicidal Thoughts:  No  Memory:  Immediate;   Good  Judgement:  Good  Insight:  Good  Psychomotor Activity:  Normal  Concentration:  Concentration: Good  Recall:  Good  Fund of Knowledge:  Good  Language:  Good  Akathisia:  No  Handed:  Right  AIMS (if indicated):     Assets:  Communication Skills Desire for  Improvement Financial Resources/Insurance Housing Intimacy Resilience Social Support Talents/Skills Transportation Vocational/Educational  ADL's:  Intact  Cognition:  WNL  Sleep:        Assessment and Plan:     11/15/2021    8:06 AM 10/18/2021    8:15 AM 09/13/2021    8:10 AM 05/03/2021    8:14 AM 02/08/2021    8:25 AM  Depression screen PHQ 2/9  Decreased Interest 0 0 0 0 0  Down, Depressed, Hopeless 1 1 0 0 0  PHQ - 2 Score 1 1 0 0 0    Flowsheet Row [facility] Video Visit from 10[facility]18/2021 in [facility] Video Visit from 09[facility]13/2021 in [facility]  C-SSRS RISK CATEGORY No Risk No Risk No Risk         Status of current problems: worsening depression   Meds: continue current meds.  [name] is going to set up an ECT appointment  Continue Strattera 40mg  qD Buspar  Ativan Effexor XR 1. Bipolar 2 disorder (HCC)  2. GAD (generalized anxiety disorder)  3. Panic 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    Therapy: brief supportive therapy provided. Discussed psychosocial stressors in detail.     Collaboration of Care: Psychiatrist AEB for ECT treatment  Patient/Guardian was advised Release of Information must be obtained prior to any record release in order to collaborate their care with an outside provider. Patient/Guardian was advised  if they have not already done so to contact the registration department to sign all necessary forms in order for [place] to release information regarding their care.   Consent: Patient/Guardian gives verbal consent for treatment and assignment of benefits for services provided during this visit. Patient/Guardian expressed understanding and agreed to proceed.    Pt's acute risk factors for suicide are increase in anxiety and depression symptoms and recent SI without plan or intent. Pt's chronic risk factors  are family history of mental illness. Pt's protective factors are positive social support, positive therapeutic relationship with provider, denying a history of suicide attempts, compliance with meds and followup. Pt denies SI and is at an acute low risk for suicide. Patient told to call clinic if any problems occur. Patient advised to go to ER if they should develop SI/HI, side effects, or if symptoms worsen. Pt has crisis numbers to call if needed. Pt acknowledged and agreed with plan and verbalized understanding.  Follow Up Instructions: Follow up in 2 weeks or sooner if needed    I discussed the assessment and treatment plan with the patient. The patient was provided an opportunity to ask questions and all were answered. The patient agreed with the plan and demonstrated an understanding of the instructions.   The patient was advised to call back or seek an in-person evaluation if the symptoms worsen or if the condition fails to improve as anticipated.  I provided 18 minutes of non-face-to-face time during this encounter.   [name], MD

## 2022-01-28 ENCOUNTER — Encounter
Admission: RE | Admit: 2022-01-28 | Discharge: 2022-01-28 | Disposition: A | Discharge status: Home / Self Care | Payer: 59 | Source: Ambulatory Visit | Attending: Psychiatry | Admitting: Psychiatry

## 2022-01-28 ENCOUNTER — Ambulatory Visit: Payer: Self-pay | Admitting: Certified Registered Nurse Anesthetist

## 2022-01-28 ENCOUNTER — Encounter: Payer: Self-pay | Admitting: Certified Registered Nurse Anesthetist

## 2022-01-28 DIAGNOSIS — F418 Other specified anxiety disorders: Secondary | ICD-10-CM | POA: Diagnosis not present

## 2022-01-28 DIAGNOSIS — F32A Depression, unspecified: Secondary | ICD-10-CM | POA: Insufficient documentation

## 2022-01-28 DIAGNOSIS — F17210 Nicotine dependence, cigarettes, uncomplicated: Secondary | ICD-10-CM | POA: Diagnosis not present

## 2022-01-28 DIAGNOSIS — R45851 Suicidal ideations: Secondary | ICD-10-CM | POA: Diagnosis not present

## 2022-01-28 DIAGNOSIS — F319 Bipolar disorder, unspecified: Secondary | ICD-10-CM | POA: Insufficient documentation

## 2022-01-28 DIAGNOSIS — F332 Major depressive disorder, recurrent severe without psychotic features: Secondary | ICD-10-CM

## 2022-01-28 DIAGNOSIS — Z885 Allergy status to narcotic agent status: Secondary | ICD-10-CM | POA: Diagnosis not present

## 2022-01-28 DIAGNOSIS — Z881 Allergy status to other antibiotic agents status: Secondary | ICD-10-CM | POA: Insufficient documentation

## 2022-01-28 DIAGNOSIS — J449 Chronic obstructive pulmonary disease, unspecified: Secondary | ICD-10-CM | POA: Insufficient documentation

## 2022-01-28 MED ORDER — SODIUM CHLORIDE 0.9 % IV SOLN
500.0000 mL | Freq: Once | INTRAVENOUS | Status: AC
Start: 2022-01-28 — End: 2022-01-28
  Administered 2022-01-28: 500 mL via INTRAVENOUS

## 2022-01-28 MED ORDER — SODIUM CHLORIDE 0.9 % IV SOLN: INTRAVENOUS | Status: DC | PRN

## 2022-01-28 MED ORDER — METHOHEXITAL SODIUM 100 MG/10ML IV SOSY
PREFILLED_SYRINGE | INTRAVENOUS | Status: DC | PRN
  Administered 2022-01-28: 170 mg via INTRAVENOUS

## 2022-01-28 MED ORDER — MIDAZOLAM HCL 2 MG/2ML IJ SOLN
4.0000 mg | Freq: Once | INTRAMUSCULAR | Status: AC
  Administered 2022-01-28: 2 mg via INTRAVENOUS

## 2022-01-28 MED ORDER — GLYCOPYRROLATE 0.2 MG/ML IJ SOLN
0.3000 mg | Freq: Once | INTRAMUSCULAR | Status: AC
  Administered 2022-01-28: 0.3 mg via INTRAVENOUS

## 2022-01-28 MED ORDER — SUCCINYLCHOLINE CHLORIDE 200 MG/10ML IV SOSY
PREFILLED_SYRINGE | INTRAVENOUS | Status: DC | PRN
  Administered 2022-01-28: 150 mg via INTRAVENOUS

## 2022-01-28 MED ORDER — GLYCOPYRROLATE 0.2 MG/ML IJ SOLN
INTRAMUSCULAR | Status: AC
  Filled 2022-01-28: qty 2

## 2022-01-28 MED ORDER — MIDAZOLAM HCL 2 MG/2ML IJ SOLN
INTRAMUSCULAR | Status: AC
  Filled 2022-01-28: qty 2

## 2022-01-28 MED ORDER — KETOROLAC TROMETHAMINE 30 MG/ML IJ SOLN
30.0000 mg | Freq: Once | INTRAMUSCULAR | Status: AC
  Administered 2022-01-28: 30 mg via INTRAVENOUS

## 2022-01-28 MED ORDER — LABETALOL HCL 5 MG/ML IV SOLN
INTRAVENOUS | Status: DC | PRN
  Administered 2022-01-28: 15 mg via INTRAVENOUS

## 2022-01-28 MED ORDER — LIDOCAINE HCL (CARDIAC) PF 100 MG/5ML IV SOSY
PREFILLED_SYRINGE | INTRAVENOUS | Status: DC | PRN
  Administered 2022-01-28: 100 mg via INTRAVENOUS

## 2022-01-28 MED ORDER — KETOROLAC TROMETHAMINE 30 MG/ML IJ SOLN
INTRAMUSCULAR | Status: AC
  Filled 2022-01-28: qty 1

## 2022-01-28 MED ORDER — LABETALOL HCL 5 MG/ML IV SOLN
INTRAVENOUS | Status: AC
  Filled 2022-01-28: qty 4

## 2022-01-28 NOTE — Procedures (Signed)
ECT SERVICES Physician's Interval Evaluation & Treatment Note  Patient Identification: FREDERIK STANDLEY III MRN:  [id number] Date of Evaluation:  01/28/2022 TX #: 1 in this series  MADRS:   MMSE:   P.E. Findings:  Normal physical exam heart and lungs normal no neurologic findings  Psychiatric Interval Note:  Very depressed withdrawn and negativistic no evidence however of psychosis.  Subjective:  Patient is a 51 y.o. male seen for evaluation for Electroconvulsive Therapy. Feeling down and negative but agrees that he has no current plan to self-harm.  Treatment Summary:   [x]   Right Unilateral             []  Bilateral   % Energy : 0.3 ms 30%   Impedance: 1250 ohms  Seizure Energy Index: 7269 V squared  Postictal Suppression Index: 68%  Seizure Concordance Index: 93%  Medications  Pre Shock: Labetalol 10 mg Brevital 170 mg succinylcholine 150 mg  Post Shock: Versed 2 mg  Seizure Duration: 29 seconds EMG 38 seconds EEG   Comments: Return on Wednesday and if he will agree return on Friday as well  Lungs:  [x]   Clear to auscultation               []  Other:   Heart:    [x]   Regular rhythm             []  irregular rhythm    [x]   Previous H&P reviewed, patient examined and there are NO CHANGES                 []   Previous H&P reviewed, patient examined and there are changes noted.   Saturday, MD 6/12/20232:31 PM

## 2022-01-28 NOTE — Anesthesia Postprocedure Evaluation (Signed)
Anesthesia Post Note  Patient: [name]  Procedure(s) Performed: ECT TX  Patient location during evaluation: PACU Anesthesia Type: General Level of consciousness: awake and alert Pain management: pain level controlled Vital Signs Assessment: post-procedure vital signs reviewed and stable Respiratory status: spontaneous breathing, nonlabored ventilation, respiratory function stable and patient connected to nasal cannula oxygen Cardiovascular status: blood pressure returned to baseline and stable Postop Assessment: no apparent nausea or vomiting Anesthetic complications: no   No notable events documented.   Last Vitals:  Vitals:   01/28/22 1225 01/28/22 1230  BP:  119/89  Pulse: 83 79  Resp: 19 16  Temp:  36.7 C  SpO2: 95% 95%    Last Pain:  Vitals:   01/28/22 1230  TempSrc:   PainSc: 0-No pain                 [name] Paeton Latouche

## 2022-01-28 NOTE — Transfer of Care (Signed)
Immediate Anesthesia Transfer of Care Note  Patient: Matthew [name]  Procedure(s) Performed: ECT TX  Patient Location: PACU  Anesthesia Type:General  Level of Consciousness: drowsy  Airway & Oxygen Therapy: Patient Spontanous Breathing and Patient connected to face mask oxygen  Post-op Assessment: Report given to RN and Post -op Vital signs reviewed and stable  Post vital signs: Reviewed and stable  Last Vitals:  Vitals Value Taken Time  BP 164/98 01/28/22 1152  Temp    Pulse 64 01/28/22 1153  Resp 18 01/28/22 1153  SpO2 92 % 01/28/22 1153  Vitals shown include unvalidated device data.  Last Pain:  Vitals:   01/28/22 1022  TempSrc:   PainSc: 0-No pain         Complications: No notable events documented.

## 2022-01-28 NOTE — Anesthesia Preprocedure Evaluation (Signed)
Anesthesia Evaluation  Patient identified by MRN, date of birth, ID band Patient awake    Reviewed: Allergy & Precautions, H&P , NPO status , Patient's Chart, lab work & pertinent test results, reviewed documented beta blocker date and time   History of Anesthesia Complications Negative for: history of anesthetic complications  Airway Mallampati: II  TM Distance: <3 FB Neck ROM: full    Dental  (+) Poor Dentition, Chipped, Missing   Pulmonary asthma , COPD, Current Smoker,    Pulmonary exam normal        Cardiovascular Exercise Tolerance: Poor (-) anginanegative cardio ROS Normal cardiovascular exam     Neuro/Psych PSYCHIATRIC DISORDERS Anxiety Depression Bipolar Disorder negative neurological ROS     GI/Hepatic negative GI ROS, Neg liver ROS,   Endo/Other  negative endocrine ROS  Renal/GU Renal disease  negative genitourinary   Musculoskeletal   Abdominal   Peds  Hematology negative hematology ROS (+)   Anesthesia Other Findings Past Medical History: 01/2012: Acute kidney failure ([place]) No date: Anxiety No date: Bipolar disorder ([place])     Comment:  hypomania No date: COPD (chronic obstructive pulmonary disease) (HCC) No date: Depression No date: Low back pain Past Surgical History: No date: NO PAST SURGERIES   Reproductive/Obstetrics negative OB ROS                             Anesthesia Physical  Anesthesia Plan  ASA: III  Anesthesia Plan: General   Post-op Pain Management:    Induction: Intravenous  PONV Risk Score and Plan:   Airway Management Planned: Mask  Additional Equipment:   Intra-op Plan:   Post-operative Plan:   Informed Consent: I have reviewed the patients History and Physical, chart, labs and discussed the procedure including the risks, benefits and alternatives for the proposed anesthesia with the patient or authorized representative who has  indicated his/her understanding and acceptance.     Dental Advisory Given  Plan Discussed with: CRNA  Anesthesia Plan Comments: (Patient consented for risks of anesthesia including but not limited to:  - adverse reactions to medications - risk of airway placement if required - damage to eyes, teeth, lips or other oral mucosa - nerve damage due to positioning  - sore throat or hoarseness - Damage to heart, brain, nerves, lungs, other parts of body or loss of life  Patient voiced understanding.)        Anesthesia Quick Evaluation

## 2022-01-28 NOTE — Anesthesia Procedure Notes (Signed)
Procedure Name: General with mask airway Date/Time: 01/28/2022 11:34 AM  Performed by: Joanette Gula, Edithe Dobbin, CRNAPre-anesthesia Checklist: Patient identified, Emergency Drugs available, Suction available, Patient being monitored and Timeout performed Patient Re-evaluated:Patient Re-evaluated prior to induction Oxygen Delivery Method: Circle system utilized Preoxygenation: Pre-oxygenation with 100% oxygen Induction Type: IV induction Ventilation: Mask ventilation throughout procedure

## 2022-01-28 NOTE — H&P (Signed)
Matthew [name] is an 51 y.o. male.   Chief Complaint: Patient reports a return of depression for a couple of months getting worse.  Still on medication.  Having some suicidal thoughts without intent or plan.  No hallucinations or psychotic symptoms HPI: Recurrent severe depression which has benefited from ECT previously  Past Medical History:  Diagnosis Date   Acute kidney failure (HCC) 01/2012   Anxiety    Bipolar disorder (HCC)    hypomania   COPD (chronic obstructive pulmonary disease) (HCC)    Depression    Low back pain     Past Surgical History:  Procedure Laterality Date   NO PAST SURGERIES      Family History  Problem Relation Age of Onset   Anxiety disorder Mother    Alcohol abuse Father    Drug abuse Father    Bipolar disorder Sister    Alcohol abuse Brother    Drug abuse Brother    Bipolar disorder Paternal Aunt    Schizophrenia Paternal Aunt    Cancer Maternal Grandmother        lung   Hypertension Maternal Grandfather    Cancer Paternal Grandmother        lung   Hypertension Paternal Grandfather    Diabetes Neg Hx    Heart disease Neg Hx    Hyperlipidemia Neg Hx    Stroke Neg Hx    Suicidality Neg Hx    Social History:  reports that he has been smoking cigarettes. He has a 40.00 pack-year smoking history. He has never used smokeless tobacco. He reports current alcohol use of about 2.0 standard drinks of alcohol per week. He reports that he does not use drugs.  Allergies:  Allergies  Allergen Reactions   Ciprofloxacin     Acute kidney failure   Codeine Nausea And Vomiting    (Not in a hospital admission)   No results found for this or any previous visit (from the past 48 hour(s)). No results found.  Review of Systems  Constitutional: Negative.   HENT: Negative.    Eyes: Negative.   Respiratory: Negative.    Cardiovascular: Negative.   Gastrointestinal: Negative.   Musculoskeletal: Negative.   Skin: Negative.   Neurological:  Negative.   Psychiatric/Behavioral:  Positive for dysphoric mood and suicidal ideas. The patient is nervous/anxious.     Blood pressure 120/85, pulse 78, temperature 98 F (36.7 C), temperature source Tympanic, resp. rate 18, height 6\' 5"  (1.956 m), weight 95.3 kg, SpO2 95 %. Physical Exam Vitals and nursing note reviewed.  Constitutional:      Appearance: He is well-developed.  HENT:     Head: Normocephalic and atraumatic.  Eyes:     Conjunctiva/sclera: Conjunctivae normal.     Pupils: Pupils are equal, round, and reactive to light.  Cardiovascular:     Heart sounds: Normal heart sounds.  Pulmonary:     Effort: Pulmonary effort is normal.  Abdominal:     Palpations: Abdomen is soft.  Musculoskeletal:        General: Normal range of motion.     Cervical back: Normal range of motion.  Skin:    General: Skin is warm and dry.  Neurological:     Mental Status: He is alert.  Psychiatric:        Attention and Perception: He is inattentive.        Mood and Affect: Mood is depressed. Affect is blunt.        Speech: Speech  is delayed.        Behavior: Behavior is withdrawn.        Thought Content: Thought content includes suicidal ideation. Thought content does not include suicidal plan.        Cognition and Memory: Cognition normal.        Judgment: Judgment normal.      Assessment/Plan Start treatment and try to get patient to agree to come back for a series of treatments.  Patient is agreeable at this point.  [name], MD 01/28/2022, 2:28 PM

## 2022-01-29 ENCOUNTER — Other Ambulatory Visit: Payer: Self-pay | Admitting: Psychiatry

## 2022-01-30 ENCOUNTER — Ambulatory Visit: Payer: Self-pay | Admitting: Anesthesiology

## 2022-01-30 ENCOUNTER — Encounter: Payer: Self-pay | Admitting: Anesthesiology

## 2022-01-30 ENCOUNTER — Encounter
Admission: RE | Admit: 2022-01-30 | Discharge: 2022-01-30 | Disposition: A | Discharge status: Home / Self Care | Payer: 59 | Source: Ambulatory Visit | Attending: Psychiatry | Admitting: Psychiatry

## 2022-01-30 DIAGNOSIS — F32A Depression, unspecified: Secondary | ICD-10-CM | POA: Diagnosis not present

## 2022-01-30 DIAGNOSIS — Z885 Allergy status to narcotic agent status: Secondary | ICD-10-CM | POA: Diagnosis not present

## 2022-01-30 DIAGNOSIS — Z881 Allergy status to other antibiotic agents status: Secondary | ICD-10-CM | POA: Diagnosis not present

## 2022-01-30 DIAGNOSIS — F17210 Nicotine dependence, cigarettes, uncomplicated: Secondary | ICD-10-CM | POA: Diagnosis not present

## 2022-01-30 DIAGNOSIS — F319 Bipolar disorder, unspecified: Secondary | ICD-10-CM | POA: Diagnosis not present

## 2022-01-30 DIAGNOSIS — R45851 Suicidal ideations: Secondary | ICD-10-CM | POA: Diagnosis not present

## 2022-01-30 DIAGNOSIS — J449 Chronic obstructive pulmonary disease, unspecified: Secondary | ICD-10-CM | POA: Diagnosis not present

## 2022-01-30 DIAGNOSIS — J45909 Unspecified asthma, uncomplicated: Secondary | ICD-10-CM | POA: Diagnosis not present

## 2022-01-30 DIAGNOSIS — F418 Other specified anxiety disorders: Secondary | ICD-10-CM | POA: Diagnosis not present

## 2022-01-30 MED ORDER — MIDAZOLAM HCL 2 MG/2ML IJ SOLN
2.0000 mg | Freq: Once | INTRAMUSCULAR | Status: AC
  Administered 2022-01-30: 2 mg via INTRAVENOUS

## 2022-01-30 MED ORDER — MIDAZOLAM HCL 2 MG/2ML IJ SOLN
INTRAMUSCULAR | Status: AC
  Filled 2022-01-30: qty 2

## 2022-01-30 MED ORDER — SODIUM CHLORIDE 0.9 % IV SOLN
500.0000 mL | Freq: Once | INTRAVENOUS | Status: AC
  Administered 2022-01-30: 500 mL via INTRAVENOUS

## 2022-01-30 MED ORDER — KETOROLAC TROMETHAMINE 30 MG/ML IJ SOLN: 30.0000 mg | Freq: Once | INTRAMUSCULAR | Status: AC

## 2022-01-30 MED ORDER — METHOHEXITAL SODIUM 100 MG/10ML IV SOSY
PREFILLED_SYRINGE | INTRAVENOUS | Status: DC | PRN
  Administered 2022-01-30: 170 mg via INTRAVENOUS

## 2022-01-30 MED ORDER — LABETALOL HCL 5 MG/ML IV SOLN
INTRAVENOUS | Status: DC | PRN
  Administered 2022-01-30: 10 mg via INTRAVENOUS

## 2022-01-30 MED ORDER — SUCCINYLCHOLINE CHLORIDE 200 MG/10ML IV SOSY
PREFILLED_SYRINGE | INTRAVENOUS | Status: DC | PRN
  Administered 2022-01-30: 150 mg via INTRAVENOUS

## 2022-01-30 MED ORDER — SODIUM CHLORIDE 0.9 % IV SOLN: INTRAVENOUS | Status: DC | PRN

## 2022-01-30 MED ORDER — KETOROLAC TROMETHAMINE 30 MG/ML IJ SOLN
INTRAMUSCULAR | Status: AC
  Administered 2022-01-30: 30 mg via INTRAVENOUS
  Filled 2022-01-30: qty 1

## 2022-01-30 NOTE — Anesthesia Preprocedure Evaluation (Addendum)
Anesthesia Evaluation  Patient identified by MRN, date of birth, ID band Patient awake    Reviewed: Allergy & Precautions, H&P , NPO status , Patient's Chart, lab work & pertinent test results, reviewed documented beta blocker date and time   History of Anesthesia Complications Negative for: history of anesthetic complications  Airway Mallampati: I  TM Distance: <3 FB Neck ROM: full    Dental  (+) Missing   Pulmonary asthma , COPD, Current Smoker and Patient abstained from smoking.,    Pulmonary exam normal        Cardiovascular Exercise Tolerance: Poor (-) anginanegative cardio ROS Normal cardiovascular exam     Neuro/Psych PSYCHIATRIC DISORDERS Anxiety Depression Bipolar Disorder negative neurological ROS     GI/Hepatic negative GI ROS, Neg liver ROS,   Endo/Other  negative endocrine ROS  Renal/GU   negative genitourinary   Musculoskeletal   Abdominal Normal abdominal exam  (+)   Peds  Hematology negative hematology ROS (+)   Anesthesia Other Findings Past Medical History: 01/2012: Acute kidney failure (HCC) No date: Anxiety No date: Bipolar disorder (HCC)     Comment:  hypomania No date: COPD (chronic obstructive pulmonary disease) (HCC) No date: Depression No date: Low back pain Past Surgical History: No date: NO PAST SURGERIES   Reproductive/Obstetrics negative OB ROS                            Anesthesia Physical  Anesthesia Plan  ASA: 2  Anesthesia Plan: General   Post-op Pain Management:    Induction: Intravenous  PONV Risk Score and Plan: Treatment may vary due to age or medical condition  Airway Management Planned: Mask  Additional Equipment:   Intra-op Plan:   Post-operative Plan:   Informed Consent: I have reviewed the patients History and Physical, chart, labs and discussed the procedure including the risks, benefits and alternatives for the proposed  anesthesia with the patient or authorized representative who has indicated his/her understanding and acceptance.     Dental Advisory Given  Plan Discussed with: CRNA  Anesthesia Plan Comments: (Patient consented for risks of anesthesia including but not limited to:  - adverse reactions to medications - risk of airway placement if required - damage to eyes, teeth, lips or other oral mucosa - nerve damage due to positioning  - sore throat or hoarseness - Damage to heart, brain, nerves, lungs, other parts of body or loss of life  Patient voiced understanding.)      Anesthesia Quick Evaluation

## 2022-01-30 NOTE — Transfer of Care (Signed)
Immediate Anesthesia Transfer of Care Note  Patient: Matthew [name]  Procedure(s) Performed: ECT TX  Patient Location: PACU  Anesthesia Type:General  Level of Consciousness: awake, drowsy and patient cooperative  Airway & Oxygen Therapy: Patient Spontanous Breathing and Patient connected to face mask oxygen  Post-op Assessment: Report given to RN and Post -op Vital signs reviewed and stable  Post vital signs: Reviewed and stable  Last Vitals:  Vitals Value Taken Time  BP 172/98 01/30/22 1214  Temp 36.2 C 01/30/22 1214  Pulse 75 01/30/22 1219  Resp 16 01/30/22 1219  SpO2 95 % 01/30/22 1219  Vitals shown include unvalidated device data.  Last Pain:  Vitals:   01/30/22 1214  TempSrc: Temporal  PainSc: 0-No pain         Complications: No notable events documented.

## 2022-01-30 NOTE — Anesthesia Procedure Notes (Signed)
Procedure Name: General with mask airway Date/Time: 01/30/2022 11:57 AM  Performed by: Mohammed Kindle, CRNAPre-anesthesia Checklist: Patient identified, Emergency Drugs available, Suction available and Patient being monitored Patient Re-evaluated:Patient Re-evaluated prior to induction Oxygen Delivery Method: Circle system utilized Preoxygenation: Pre-oxygenation with 100% oxygen Induction Type: IV induction Placement Confirmation: positive ETCO2, CO2 detector and breath sounds checked- equal and bilateral Dental Injury: Teeth and Oropharynx as per pre-operative assessment

## 2022-01-31 NOTE — Anesthesia Postprocedure Evaluation (Signed)
Anesthesia Post Note  Patient: Matthew [name]  Procedure(s) Performed: ECT TX  Patient location during evaluation: PACU Anesthesia Type: General Level of consciousness: awake and alert Pain management: pain level controlled Vital Signs Assessment: post-procedure vital signs reviewed and stable Respiratory status: spontaneous breathing, nonlabored ventilation and respiratory function stable Cardiovascular status: blood pressure returned to baseline and stable Postop Assessment: no apparent nausea or vomiting Anesthetic complications: no   No notable events documented.   Last Vitals:  Vitals:   01/30/22 1249 01/30/22 1300  BP:  120/82  Pulse: 78 75  Resp: 17 16  Temp:  (!) 36.2 C  SpO2: 96%     Last Pain:  Vitals:   01/30/22 1300  TempSrc: Tympanic  PainSc: 0-No pain                 Foye Deer

## 2022-02-06 ENCOUNTER — Telehealth: Payer: Self-pay

## 2022-02-07 ENCOUNTER — Telehealth (HOSPITAL_BASED_OUTPATIENT_CLINIC_OR_DEPARTMENT_OTHER): Payer: 59 | Admitting: Psychiatry

## 2022-02-07 ENCOUNTER — Other Ambulatory Visit (HOSPITAL_COMMUNITY): Payer: Self-pay

## 2022-02-07 ENCOUNTER — Encounter (HOSPITAL_COMMUNITY): Payer: Self-pay | Admitting: Psychiatry

## 2022-02-07 DIAGNOSIS — R37 Sexual dysfunction, unspecified: Secondary | ICD-10-CM | POA: Diagnosis not present

## 2022-02-07 DIAGNOSIS — F3181 Bipolar II disorder: Secondary | ICD-10-CM | POA: Diagnosis not present

## 2022-02-07 DIAGNOSIS — R41840 Attention and concentration deficit: Secondary | ICD-10-CM | POA: Diagnosis not present

## 2022-02-07 DIAGNOSIS — F410 Panic disorder [episodic paroxysmal anxiety] without agoraphobia: Secondary | ICD-10-CM | POA: Diagnosis not present

## 2022-02-07 DIAGNOSIS — F411 Generalized anxiety disorder: Secondary | ICD-10-CM

## 2022-02-07 MED ORDER — TADALAFIL 10 MG PO TABS
10.0000 mg | ORAL_TABLET | Freq: Every day | ORAL | 0 refills | Status: DC | PRN
  Filled 2022-02-07: qty 10, 10d supply, fill #0

## 2022-02-07 NOTE — Progress Notes (Signed)
Virtual Visit via Video Note  I connected with Matthew [name] on 02/07/22 at  8:30 AM EDT by a video enabled telemedicine application and verified that I am speaking with the correct person using two identifiers.  Location: Patient: home Provider: office   I discussed the limitations of evaluation and management by telemedicine and the availability of in person appointments. The patient expressed understanding and agreed to proceed.  History of Present Illness: Matthew [name] had ECT on 6/12 and 01/30/22. He is feeling better now. His mood has been "a lot better" since the 2nd treatment. Matthew [name] is thinking clearer and is not focused on depression. The depression seems to be have resolved since the 2nd treatment. He is managing his work stress level. He is not feeling as bothered by his co-workers. Matthew [name] is sleeping ok and is getting about 5-6 hrs. He is not hungry during the day and is only eating dinner. He will eat a snack if needed during the day. This has been going on for several years. He denies any manic or hypomanic like symptoms. He has not been wanting to buy anything. He is no longer feeling trapped. He denies SI/HI since having ECT. He is going to have another ECT treatment in December. He is taking Ativan 0.5mg  qAM and lunch and 1mg  at bedtime. It helps him deal with his work anxiety. He still has stress induced panic attacks.    Observations/Objective: Psychiatric Specialty Exam: ROS  There were no vitals taken for this visit.There is no height or weight on file to calculate BMI.  General Appearance: Neat and Well Groomed  Eye Contact:  Good  Speech:  Clear and Coherent and Normal Rate  Volume:  Normal  Mood:  Euthymic  Affect:  Full Range  Thought Process:  Goal Directed, Linear, and Descriptions of Associations: Intact  Orientation:  Full (Time, Place, and Person)  Thought Content:  Logical  Suicidal Thoughts:  No  Homicidal Thoughts:  No  Memory:  Immediate;   Good  Judgement:   Good  Insight:  Good  Psychomotor Activity:  Normal  Concentration:  Concentration: Good  Recall:  Good  Fund of Knowledge:  Good  Language:  Good  Akathisia:  No  Handed:  Right  AIMS (if indicated):     Assets:  Communication Skills Desire for Improvement Financial Resources/Insurance Housing Intimacy Leisure Time Resilience Social Support Talents/Skills Transportation Vocational/Educational  ADL's:  Intact  Cognition:  WNL  Sleep:        Assessment and Plan:     02/07/2022    8:40 AM 11/15/2021    8:06 AM 10/18/2021    8:15 AM 09/13/2021    8:10 AM 05/03/2021    8:14 AM  Depression screen PHQ 2/9  Decreased Interest 0 0 0 0 0  Down, Depressed, Hopeless 0 1 1 0 0  PHQ - 2 Score 0 1 1 0 0    Flowsheet Row Video Visit from 02/07/20[facility] E[facility] Treatment from 01/30/2022 [facility] ECT Treatment from 01[facility]28/2022 in [facility]  C-SSRS RISK CATEGORY No Risk No Risk No Risk        Status of current problems: significant improvement in mood  Meds:  Continue at current doses- Ativan Buspar Effexor XR Strattera  1. Bipolar 2 disorder (HCC)  2. GAD (generalized anxiety disorder)  3. Panic 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  4. Poor concentration  5. Sexual dysfunction, unspecified -  tadalafil (CIALIS) 10 MG tablet; Take 1 tablet (10 mg total) by mouth daily as needed for erectile dysfunction.  Dispense: 10 tablet; Refill: 0   Maintenance ECT in December.    We have tried several different ones and he is not able to tolerate them. He understands the increased risks of having a manic episode when only treatment with antidepressants and not a mood stablizer.   Labs: none today    Therapy: brief supportive therapy provided. Discussed psychosocial stressors in detail.   Discussed the importance of self care  Recommended pt stop smoking cigarettes. He  is not ready to quit yet.    Collaboration of Care: Other none today  Patient/Guardian was advised Release of Information must be obtained prior to any record release in order to collaborate their care with an outside provider. Patient/Guardian was advised if they have not already done so to contact the registration department to sign all necessary forms in order for [place] to release information regarding their care.   Consent: Patient/Guardian gives verbal consent for treatment and assignment of benefits for services provided during this visit. Patient/Guardian expressed understanding and agreed to proceed.     Follow Up Instructions: Follow up in 1-2 months or sooner if needed    I discussed the assessment and treatment plan with the patient. The patient was provided an opportunity to ask questions and all were answered. The patient agreed with the plan and demonstrated an understanding of the instructions.   The patient was advised to call back or seek an in-person evaluation if the symptoms worsen or if the condition fails to improve as anticipated.  I provided 12 minutes of non-face-to-face time during this encounter.   [name], MD

## 2022-02-08 ENCOUNTER — Other Ambulatory Visit (HOSPITAL_COMMUNITY): Payer: Self-pay

## 2022-02-21 ENCOUNTER — Telehealth (HOSPITAL_COMMUNITY): Payer: 59 | Admitting: Psychiatry

## 2022-03-14 ENCOUNTER — Other Ambulatory Visit (HOSPITAL_COMMUNITY): Payer: Self-pay

## 2022-03-14 ENCOUNTER — Telehealth (HOSPITAL_COMMUNITY): Payer: Self-pay | Admitting: *Deleted

## 2022-03-14 ENCOUNTER — Telehealth (HOSPITAL_BASED_OUTPATIENT_CLINIC_OR_DEPARTMENT_OTHER): Payer: 59 | Admitting: Psychiatry

## 2022-03-14 DIAGNOSIS — F411 Generalized anxiety disorder: Secondary | ICD-10-CM | POA: Diagnosis not present

## 2022-03-14 DIAGNOSIS — R41840 Attention and concentration deficit: Secondary | ICD-10-CM | POA: Diagnosis not present

## 2022-03-14 DIAGNOSIS — F410 Panic disorder [episodic paroxysmal anxiety] without agoraphobia: Secondary | ICD-10-CM | POA: Diagnosis not present

## 2022-03-14 DIAGNOSIS — F5105 Insomnia due to other mental disorder: Secondary | ICD-10-CM | POA: Diagnosis not present

## 2022-03-14 DIAGNOSIS — F3181 Bipolar II disorder: Secondary | ICD-10-CM | POA: Diagnosis not present

## 2022-03-14 DIAGNOSIS — F99 Mental disorder, not otherwise specified: Secondary | ICD-10-CM | POA: Diagnosis not present

## 2022-03-14 MED ORDER — ATOMOXETINE HCL 40 MG PO CAPS
40.0000 mg | ORAL_CAPSULE | Freq: Two times a day (BID) | ORAL | 0 refills | Status: DC
  Filled 2022-03-14: qty 360, 180d supply, fill #0

## 2022-03-14 MED ORDER — VENLAFAXINE HCL ER 75 MG PO CP24
225.0000 mg | ORAL_CAPSULE | Freq: Every day | ORAL | 0 refills | Status: DC
  Filled 2022-03-14: qty 270, 90d supply, fill #0

## 2022-03-14 MED ORDER — LORAZEPAM 1 MG PO TABS
1.0000 mg | ORAL_TABLET | Freq: Three times a day (TID) | ORAL | 0 refills | Status: DC | PRN
  Filled 2022-03-14: qty 90, 30d supply, fill #0

## 2022-03-14 MED ORDER — BUSPIRONE HCL 15 MG PO TABS
ORAL_TABLET | ORAL | 0 refills | Status: DC
  Filled 2022-03-14: qty 135, 90d supply, fill #0

## 2022-03-14 NOTE — Progress Notes (Signed)
Virtual Visit via Video Note  I connected with [name] on 03/14/22 at  8:30 AM EDT by   a video enabled telemedicine application and verified that I am speaking with the correct person using two identifiers.  Location: Patient: home Provider: office   I discussed the limitations of evaluation and management by telemedicine and the availability of in person appointments. The patient expressed understanding and agreed to proceed.  History of Present Illness: [name]  shares that work remains stressful. In some ways it has been better but mostly the same. The anxiety is ongoing but not worse. [name] has been having on/off stress induced panic attacks over the last month. For the 1st 2 weeks after ECT his mood was good. After that he noticed he was having more mood swings. He took Lamictal for 2 weeks but last Monday he became depressed and SI. He admits SI with plan stick a knife in his heart. He denies intent. He denies HI.  The feelings continue even though he stopped Lamictal 3 days ago. Today he is feeling irritable and is stressed by something happening at work. He notes he will be depressed if he get bad news. [name] denies manic and hypomanic like symptoms and episodes. He is not sleeping well and is feeling tired. I asked him if he would consider another ECT treatment and he is reluctanant. [name] is tired of meds not working and the thought of having to do ECT treatments more often. He is endorsing some hopelessness. He does not want to go the hospital and states he will talk to his wife about getting ECT again.    Observations/Objective: Psychiatric Specialty Exam: ROS  There were no vitals taken for this visit.There is no height or weight on file to calculate BMI.  General Appearance: Casual and Neat  Eye Contact:  Good  Speech:  Clear and Coherent and Normal Rate  Volume:  Normal  Mood:  Anxious and Depressed  Affect:  Congruent, Depressed, and Tearful  Thought Process:  Goal  Directed, Linear, and Descriptions of Associations: Intact  Orientation:  Full (Time, Place, and Person)  Thought Content:  Logical  Suicidal Thoughts:  No  Homicidal Thoughts:  No  Memory:  Immediate;   Good  Judgement:  Good  Insight:  Good  Psychomotor Activity:  Normal  Concentration:  Concentration: Good  Recall:  Good  Fund of Knowledge:  Good  Language:  Good  Akathisia:  No  Handed:  Right  AIMS (if indicated):     Assets:  Communication Skills Desire for Improvement Financial Resources/Insurance Housing Intimacy Leisure Time Resilience Social Support Talents/Skills Transportation Vocational/Educational  ADL's:  Intact  Cognition:  WNL  Sleep:        Assessment and Plan:     03/14/2022    9:00 AM 02/07/2022    8:40 AM 11/15/2021    8:06 AM 10/18/2021    8:15 AM 09/13/2021    8:10 AM  Depression screen PHQ 2/9  Decreased Interest 2 0 0 0 0  Down, Depressed, Hopeless 2 0 1 1 0  PHQ - 2 Score 4 0 1 1 0  Altered sleeping 2      Tired, decreased energy 2      Feeling bad or failure about yourself  2      Trouble concentrating 2      Moving slowly or fidgety/restless 0      Suicidal thoughts 2      PHQ-9 Score 14  Difficult doing work/chores Very difficult        Flowsheet Row Video Visit [facility] 03/14/2022 in [facility] Video Visit from 02/07/2022 in [facility] ECT Treatment from 01/30/2022 in [facility]  C-SSRS RISK CATEGORY Error: Q7 should not be populated when Q6 is No No Risk No Risk         Status of current problems: worsening depression symptoms. Mood stabilizers have not been effective and seem to cause worsening depression. I encouraged him to consider ECT. I contacted Dr. Toni Amend to let him know that [name] might  be contacting him to schedule a treatment. Dr. Toni Amend shared the the earliest treatment is this Monday.   Meds:  1.  Bipolar 2 disorder (HCC) - atomoxetine (STRATTERA) 40 MG capsule; Take 1 capsule (40 mg total) by mouth 2 (two) times daily with a meal.  Dispense: 360 capsule; Refill: 0 - venlafaxine XR (EFFEXOR-XR) 75 MG 24 hr capsule; Take 3 capsules (225 mg total) by mouth daily with breakfast.  Dispense: 270 capsule; Refill: 0  2. GAD (generalized anxiety disorder) - busPIRone (BUSPAR) 15 MG tablet; Take 0.5 tablets (7.5 mg total) by mouth daily with breakfast AND 1 tablet (15 mg total) at bedtime.  Dispense: 135 tablet; Refill: 0 - LORazepam (ATIVAN) 1 MG tablet; Take 1 tablet (1 mg total) by mouth every 8 (eight) hours as needed for anxiety.  Dispense: 90 tablet; Refill: 0 - venlafaxine XR (EFFEXOR-XR) 75 MG 24 hr capsule; Take 3 capsules (225 mg total) by mouth daily with breakfast.  Dispense: 270 capsule; Refill: 0  3. Panic 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 - LORazepam (ATIVAN) 1 MG tablet; Take 1 tablet (1 mg total) by mouth every 8 (eight) hours as needed for anxiety.  Dispense: 90 tablet; Refill: 0 - venlafaxine XR (EFFEXOR-XR) 75 MG 24 hr capsule; Take 3 capsules (225 mg total) by mouth daily with breakfast.  Dispense: 270 capsule; Refill: 0  4. Poor concentration - atomoxetine (STRATTERA) 40 MG capsule; Take 1 capsule (40 mg total) by mouth 2 (two) times daily with a meal.  Dispense: 360 capsule; Refill: 0  5. Insomnia due to other mental disorder - LORazepam (ATIVAN) 1 MG tablet; Take 1 tablet (1 mg total) by mouth every 8 (eight) hours as needed for anxiety.  Dispense: 90 tablet; Refill: 0     Labs: none today    Therapy: brief supportive therapy provided. Discussed psychosocial stressors in detail.      Collaboration of Care: Psychiatrist AEB for ECT   Patient/Guardian was advised Release of Information must be obtained prior to any record release in order to collaborate their care with an outside provider. Patient/Guardian was advised if they have not already done so to contact the  registration department to sign all necessary forms in order for [place] to release information regarding their care.   Consent: Patient/Guardian gives verbal consent for treatment and assignment of benefits for services provided during this visit. Patient/Guardian expressed understanding and agreed to proceed.     Pt's acute risk factors for suicide are worsening depression, increased anxiety, endorsing SI with a plan but no intent. Pt's chronic risk factors are being Caucasian, male. Pt's protective factors are being married, good therapeutic relationship, positive social support, living with family and feeling responsible for his kids. Pt is endorsing SI with a plan without intent. and is at an acute moderate risk for suicide. He does not want to be hospitalized on an  inpatient psych unit but is willing to consider ECT. Patient told to call clinic if any problems occur. Patient advised to go to ER if they should have worsening SI, side effects, or if symptoms worsen. Pt has crisis numbers to call if needed. Pt acknowledged and agreed with plan and verbalized understanding. We reviewed the crisis plan.   Follow Up Instructions: Follow up in 1 week or sooner if needed    I discussed the assessment and treatment plan with the patient. The patient was provided an opportunity to ask questions and all were answered. The patient agreed with the plan and demonstrated an understanding of the instructions.   The patient was advised to call back or seek an in-person evaluation if the symptoms worsen or if the condition fails to improve as anticipated.  I provided 18 minutes of non-face-to-face time during this encounter.   [name], MD

## 2022-03-14 NOTE — Telephone Encounter (Signed)
Writer returned pt message regarding scheduling an appointment for ECT tomorrow first appointment. Pt had an appointment with Dr. Michae Kava today and ECT was discussed. Provider has reached out to Dr. Toni Amend and unfortunately he does not have a nurse tomorrow however would make sure pt has an appointment first thing Monday (03/18/22) morning and he will be contacted regarding appointment. Pt was strongly encouraged to go to Emergency Room or [facility] if he is not feeling safe in the interim. Pt verbalizes understanding.

## 2022-03-18 ENCOUNTER — Telehealth: Payer: Self-pay

## 2022-03-21 ENCOUNTER — Telehealth (HOSPITAL_COMMUNITY): Payer: Self-pay | Admitting: Psychiatry

## 2022-03-21 ENCOUNTER — Telehealth (HOSPITAL_COMMUNITY): Payer: 59 | Admitting: Psychiatry

## 2022-03-21 NOTE — Telephone Encounter (Signed)
Patient was not present on video platform used through [employer]. I called the patient at our scheduled appointment time several times. Each time a message came up saying "Call can not be completed at this time" and I was not able to leave a voice message. There was no return phone call during out scheduled visit time. I was not able to speak with the patient today, as they were a no show for their scheduled appointment.

## 2022-03-21 NOTE — Progress Notes (Unsigned)
Virtual Visit via ***Video Note  I connected with Matthew [name] on 03/21/22 at  8:15 AM EDT by  *** a video enabled telemedicine application and verified that I am speaking with the correct person using two identifiers.  Location: Patient: *** Provider: office   I discussed the limitations of evaluation and management by telemedicine and the availability of in person appointments. The patient expressed understanding and agreed to proceed.  History of Present Illness: ***   Observations/Objective: Psychiatric Specialty Exam: ROS  There were no vitals taken for this visit.There is no height or weight on file to calculate BMI.  General Appearance: {Appearance:22683}  Eye Contact:  {BHH EYE CONTACT:22684}  Speech:  {Speech:22685}  Volume:  {Volume (PAA):22686}  Mood:  {BHH MOOD:22306}  Affect:  {Affect (PAA):22687}  Thought Process:  {Thought Process (PAA):22688}  Orientation:  {BHH ORIENTATION (PAA):22689}  Thought Content:  {Thought Content:22690}  Suicidal Thoughts:  {ST/HT (PAA):22692}  Homicidal Thoughts:  {ST/HT (PAA):22692}  Memory:  {BHH MEMORY:22881}  Judgement:  {Judgement (PAA):22694}  Insight:  {Insight (PAA):22695}  Psychomotor Activity:  {Psychomotor (PAA):22696}  Concentration:  {Concentration:21399}  Recall:  {BHH GOOD/FAIR/POOR:22877}  Fund of Knowledge:  {BHH GOOD/FAIR/POOR:22877}  Language:  {BHH GOOD/FAIR/POOR:22877}  Akathisia:  {BHH YES OR NO:22294}  Handed:  {Handed:22697}  AIMS (if indicated):     Assets:  {Assets (PAA):22698}  ADL's:  {BHH FTD'D:22025}  Cognition:  {chl bhh cognition:304700322}  Sleep:        Assessment and Plan:     03/14/2022    9:00 AM 02/07/2022    8:40 AM 11/15/2021    8:06 AM 10/18/2021    8:15 AM 09/13/2021    8:10 AM  Depression screen PHQ 2/9  Decreased Interest 2 0 0 0 0  Down, Depressed, Hopeless 2 0 1 1 0  PHQ - 2 Score 4 0 1 1 0  Altered sleeping 2      Tired, decreased energy 2      Feeling bad or  failure about yourself  2      Trouble concentrating 2      Moving slowly or fidgety/restless 0      Suicidal thoughts 2      PHQ-9 Score 14      Difficult doing work/chores Very difficult        Flowsheet Row Video Visit [facility] 03/14/2022 in [facility] Video Visit from 02/07/2022 in [facility] ECT Treatment from 01/30/2022 in [facility]  C-SSRS RISK CATEGORY Error: Q7 should not be populated when Q6 is No No Risk No Risk        Status of current problems: ***  Meds:  There are no diagnoses linked to this encounter.    Labs: ***    Therapy: brief supportive therapy provided. Discussed psychosocial stressors in detail.  ***     Collaboration of Care: {BH OP Collaboration of Care:21014065}  Patient/Guardian was advised Release of Information must be obtained prior to any record release in order to collaborate their care with an outside provider. Patient/Guardian was advised if they have not already done so to contact the registration department to sign all necessary forms in order for [place] to release information regarding their care.   Consent: Patient/Guardian gives verbal consent for treatment and assignment of benefits for services provided during this visit. Patient/Guardian expressed understanding and agreed to proceed.      ***Pt's acute risk factors for suicide are ***. Pt's  chronic risk factors are ***. Pt's protective factors are ***. Pt denies SI and is at an acute low risk for suicide. Patient told to call clinic if any problems occur. Patient advised to go to ER if they should develop SI/HI, side effects, or if symptoms worsen. Pt has crisis numbers to call if needed. Pt acknowledged and agreed with plan and verbalized understanding.  Follow Up Instructions: Follow up in *** or sooner if needed    I discussed the assessment and treatment plan with the  patient. The patient was provided an opportunity to ask questions and all were answered. The patient agreed with the plan and demonstrated an understanding of the instructions.   The patient was advised to call back or seek an in-person evaluation if the symptoms worsen or if the condition fails to improve as anticipated.  I provided *** minutes of non-face-to-face time during this encounter.   [name], MD

## 2022-04-15 ENCOUNTER — Ambulatory Visit (HOSPITAL_COMMUNITY)
Admission: EM | Admit: 2022-04-15 | Discharge: 2022-04-15 | Disposition: A | Discharge status: Home / Self Care | Payer: 59 | Attending: Psychiatry | Admitting: Psychiatry

## 2022-04-15 DIAGNOSIS — R45851 Suicidal ideations: Secondary | ICD-10-CM | POA: Diagnosis not present

## 2022-04-15 DIAGNOSIS — F339 Major depressive disorder, recurrent, unspecified: Secondary | ICD-10-CM

## 2022-04-15 DIAGNOSIS — Z91199 Patient's noncompliance with other medical treatment and regimen due to unspecified reason: Secondary | ICD-10-CM | POA: Diagnosis not present

## 2022-04-15 MED ORDER — HYDROXYZINE HCL 50 MG PO TABS: 50.0000 mg | ORAL_TABLET | Freq: Every day | ORAL | 0 refills | Status: DC

## 2022-04-15 MED ORDER — HYDROXYZINE HCL 25 MG PO TABS
50.0000 mg | ORAL_TABLET | Freq: Every day | ORAL | Status: DC
Start: 2022-04-16 — End: 2022-04-16
  Administered 2022-04-15: 50 mg via ORAL
  Filled 2022-04-15: qty 2

## 2022-04-15 NOTE — BH Assessment (Signed)
Comprehensive Clinical Assessment (CCA) Note  04/15/2022 Matthew [name] [id number]  DISPOSITION: Gave clinical report to Matthew Guadeloupe, NP who completed MSE and recommended continuous assessment. Pt declined recommendation and contracted for safety to return home with his wife. Pt's wife agreed to secure firearms. Pt is requesting appointment for ECT with Dr Matthew [name] on Wednesday. TTS spoke with Matthew [name] with TOC who said she would let day-shift TOC know to arrange appointment and contact Pt's wife at [phone]. She also left message for Dr Matthew [name] through secure message.  The patient demonstrates the following risk factors for suicide: Chronic risk factors for suicide include: psychiatric disorder of bipolar II disorder . Acute risk factors for suicide include: N/A. Protective factors for this patient include: positive social support, positive therapeutic relationship, and responsibility to others (children, family). Considering these factors, the overall suicide risk at this point appears to be moderate. Patient is appropriate for outpatient follow up.  Flowsheet Row ED from 04/15/2022 in [facility] Video Visit from 03/14/2022 in [facility] Video Visit from [facility]  C-SSRS RISK CATEGORY Moderate Risk Error: Q7 should not be populated when Q6 is No No Risk      Pt is a 51 year old married male who presents to [facility] accompanied by his wife, Matthew [name] [phone], who participated in assessment with Pt's consent. Pt's medical record indicates a history of bipolar II disorder. He reports he has felt increasingly depressed for the past two weeks and "it was very bad today." He reports suicidal ideation today and reluctantly states he had thoughts of wrecking his vehicle. He identify uncertainty whether an attempt would be successful and caring for his wife  and children as primary deterrents to suicide. He denies history of suicide attempts. Pt reports a history of mood swings-- primarily depression, anxiety and anger-- but says he has been primarily depressed recently. Pt acknowledges symptoms including social withdrawal, loss of interest in usual pleasures, fatigue, irritability, decreased concentration, and feelings of worthlessness and hopelessness. He describes poor quality sleep with frequent waking, averaging a total of six hours per night. He acknowledges high anxiety and reports panic attacks every other day. He describes his appetite as fair. He denies current homicidal ideation or history of violence.  Pt reports drinking half a fifth of liquor daily. He says he smokes approximately 4 joints of marijuana daily. He is prescribed Straterra and Ativan and denies abuse of these medications. He denies other substance use. Pt denies that alcohol or substance use have resulted in negative consequences.   Pt identifies work as his primary stressor. 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 He lives with his wife and their four children, ages 34, 103, 32, and 69. He identifies his wife, sister and brother as his primary supports. He denies history of abuse or trauma. He denies legal problems. Pt says he does have access to firearms.  Pt is currently receiving outpatient medication management with Dr Matthew [name]. He says he takes medications as prescribed. He has a history of ECT with Dr Matthew [name] at [facility]. He denies any history of inpatient psychiatric treatment.  Pt is casually dressed, alert and oriented x4. Pt speaks in a clear tone, at moderate volume and normal pace. Motor behavior appears normal. Eye contact is good. Pt's mood is depressed and affect is congruent with mood. Thought process is coherent and  relevant. There is no indication Pt is currently responding to internal stimuli  or experiencing delusional thought content. He is cooperative. Pt says he would like to be admitted to [facility] and scheduled for ECT on Wednesday at [facility] with Dr. Toni [name].   Chief Complaint:  Chief Complaint  Patient presents with   Depression   suicidal ideation   Visit Diagnosis: F31.81 Bipolar II disorder   CCA Screening, Triage and Referral (STR)  Patient Reported Information How did you hear about [place]? Self  What Is the Reason for Your Visit/Call Today? Pt presents to [facility] voluntarily, accompanied by his wife with complaints of being depressed and having suicidal thoughts. Pt reports he is currently suicidal, but had a plan earlier (would not disclose) and preferred to share with the doctor. Pt has hx of Bipolar and anxiety. He reports that he has been having suicidal thoughts for a few weeks now, but nothing in particular triggered him to come in tonight. Pt states " I just still don't want to be here". Pt has had alcohol (1/2 a fifth bottle) and marijuana (2 joints). Pt denies HI, AVH at this time.  How Long Has This Been Causing You Problems? 1 wk - 1 month  What Do You Feel Would Help You the Most Today? Treatment for Depression or other mood problem   Have You Recently Had Any Thoughts About Hurting Yourself? Yes  Are You Planning to Commit Suicide/Harm Yourself At This time? No (pt states earlier today, but would not disclose his plan)   Have you Recently Had Thoughts About Hurting Someone [name]? No  Are You Planning to Harm Someone at This Time? No  Explanation: No data recorded  Have You Used Any Alcohol or Drugs in the Past 24 Hours? Yes  How Long Ago Did You Use Drugs or Alcohol? No data recorded What Did You Use and How Much? 1/2 fifth of a bottle and 2 joints (marijuana)   Do You Currently Have a Therapist/Psychiatrist? Yes  Name of Therapist/Psychiatrist: Dr Matthew [name]   Have You Been Recently Discharged From Any Office Practice or  Programs? No  Explanation of Discharge From Practice/Program: No data recorded    CCA Screening Triage Referral Assessment Type of Contact: Face-to-Face  Telemedicine Service Delivery:   Is this Initial or Reassessment? No data recorded Date Telepsych consult ordered in [facility]:  No data recorded Time Telepsych consult ordered in [facility]:  No data recorded Location of Assessment: [facility] Assessment Services  Provider Location: [facility] Assessment Services   Collateral Involvement: Pt's wife: Nhia Heaphy [phone]   Does Patient Have a Court Appointed Legal Guardian? No data recorded Name and Contact of Legal Guardian: No data recorded If Minor and Not Living with Parent(s), Who has Custody? NA  Is CPS involved or ever been involved? Never  Is APS involved or ever been involved? Never   Patient Determined To Be At Risk for Harm To Self or Others Based on Review of Patient Reported Information or Presenting Complaint? Yes, for Self-Harm  Method: No data recorded Availability of Means: No data recorded Intent: No data recorded Notification Required: No data recorded Additional Information for Danger to Others Potential: No data recorded Additional Comments for Danger to Others Potential: No data recorded Are There Guns or Other Weapons in Your Home? No data recorded Types of Guns/Weapons: No data recorded Are These Weapons Safely Secured?  No data recorded Who Could Verify You Are Able To Have These Secured: No data recorded Do You Have any Outstanding Charges, Pending Court Dates, Parole/Probation? No data recorded Contacted To Inform of Risk of Harm To Self or Others: Family/Significant Other:    Does Patient Present under Involuntary Commitment? No  IVC Papers Initial File Date: No data recorded  [place]   Patient Currently Receiving the Following Services: Medication Management   Determination of Need: Urgent (48  hours)   Options For Referral: Other: Comment; BH Urgent Care; Outpatient Therapy     CCA Biopsychosocial Patient Reported Schizophrenia/Schizoaffective Diagnosis in Past: No   Strengths: Pt is educated, participates in outpatient treatment, has good family support.   Mental Health Symptoms Depression:   Change in energy/activity; Difficulty Concentrating; Fatigue; Hopelessness; Irritability; Sleep (too much or little); Worthlessness   Duration of Depressive symptoms:  Duration of Depressive Symptoms: Greater than two weeks   Mania:   None   Anxiety:    Difficulty concentrating; Fatigue; Irritability; Restlessness; Sleep; Tension; Worrying   Psychosis:   None   Duration of Psychotic symptoms:    Trauma:   None   Obsessions:   None   Compulsions:   None   Inattention:   N/A   Hyperactivity/Impulsivity:   N/A   Oppositional/Defiant Behaviors:   N/A   Emotional Irregularity:   Chronic feelings of emptiness   Other Mood/Personality Symptoms:   NA    Mental Status Exam Appearance and self-care  Stature:   Tall   Weight:   Average weight   Clothing:   Casual   Grooming:   Normal   Cosmetic use:   None   Posture/gait:   Normal   Motor activity:   Not Remarkable   Sensorium  Attention:   Normal   Concentration:   Normal   Orientation:   X5   Recall/memory:   Normal   Affect and Mood  Affect:   Depressed   Mood:   Depressed   Relating  Eye contact:   Normal   Facial expression:   Depressed; Responsive   Attitude toward examiner:   Cooperative   Thought and Language  Speech flow:  Clear and Coherent   Thought content:   Appropriate to Mood and Circumstances   Preoccupation:   None   Hallucinations:   None   Organization:  No data recorded  [employer] ServicesExecutive Functions  Fund of Knowledge:   Good   Intelligence:   Above Average   Abstraction:   Normal   Judgement:   Fair   Dance movement psychotherapisteality Testing:    Realistic   Insight:   Good   Decision Making:   Normal   Social Functioning  Social Maturity:   Responsible   Social Judgement:   Normal   Stress  Stressors:   Work   Coping Ability:   Contractorxhausted   Skill Deficits:   None   Supports:   Family     Religion: Religion/Spirituality Are You A Religious Person?: Yes How Might This Affect Treatment?: Pt describes himself as spiritual  Leisure/Recreation: Leisure / Recreation Do You Have Hobbies?: Yes Leisure and Hobbies: [employer] books, cars, golf, fishing  Exercise/Diet: Exercise/Diet Do You Exercise?: No Have You Gained or Lost A Significant Amount of Weight in the Past Six Months?: No Do You Follow a Special Diet?: No Do You Have Any Trouble Sleeping?: Yes Explanation of Sleeping Difficulties: Pt reports frequent waking and describes poor quality sleep.   CCA  Employment/Education Employment/Work Situation: Employment / Work Situation Employment Situation: Employed Work Stressors: Pt describes his job as stressful and is dealing with fiscal year responsibilities. Patient's Job has Been Impacted by Current Illness: No Has Patient ever Been in the [employer]?: No  Education: Education Is Patient Currently Attending School?: No Did You Product manager?: Yes What Type of College Degree Do you Have?: PhD Did You Have An Individualized Education Program (IIEP): No Did You Have Any Difficulty At School?: No Patient's Education Has Been Impacted by Current Illness: No   CCA Family/Childhood History Family and Relationship History: Family history Marital status: Married Does patient have children?: Yes How many children?: 4 How is patient's relationship with their children?: Good relationship with children, ages 76, 71, 39, and 15  Childhood History:  Childhood History By whom was/is the patient raised?: Mother, Grandparents Did patient suffer any verbal/emotional/physical/sexual abuse as a child?: No Did  patient suffer from severe childhood neglect?: No Has patient ever been sexually abused/assaulted/raped as an adolescent or adult?: No Was the patient ever a victim of a crime or a disaster?: No Witnessed domestic violence?: No Has patient been affected by domestic violence as an adult?: No  Child/Adolescent Assessment:     CCA Substance Use Alcohol/Drug Use: Alcohol / Drug Use Pain Medications: Denies abuse Prescriptions: Denies abuse Over the Counter: Denies abuse History of alcohol / drug use?: Yes Longest period of sobriety (when/how long): Unknown Negative Consequences of Use:  (Pt denies) Withdrawal Symptoms: None Substance #1 Name of Substance 1: Alcohol 1 - Age of First Use: unknown 1 - Amount (size/oz): 1/2 a fifth of liquor 1 - Frequency: daily 1 - Duration: ongoing 1 - Last Use / Amount: 04/15/2022, 1/2 a fifth of liquor 1 - Method of Aquiring: Purchase 1- Route of Use: Oral ingestion Substance #2 Name of Substance 2: Marijuana 2 - Age of First Use: unknown 2 - Amount (size/oz): 4 joint 2 - Frequency: daily 2 - Duration: ongoing 2 - Last Use / Amount: 04/15/2022 2 - Method of Aquiring: unknown 2 - Route of Substance Use: Smoke inhalation                     ASAM's:  Six Dimensions of Multidimensional Assessment  Dimension 1:  Acute Intoxication and/or Withdrawal Potential:      Dimension 2:  Biomedical Conditions and Complications:      Dimension 3:  Emotional, Behavioral, or Cognitive Conditions and Complications:     Dimension 4:  Readiness to Change:     Dimension 5:  Relapse, Continued use, or Continued Problem Potential:     Dimension 6:  Recovery/Living Environment:     ASAM Severity Score:    ASAM Recommended Level of Treatment:     Substance use Disorder (SUD)    Recommendations for Services/Supports/Treatments:    Discharge Disposition:    DSM5 Diagnoses: Patient Active Problem List   Diagnosis Date Noted   GAD (generalized  anxiety disorder) 02/16/2019   Panic 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 07/07/2014   Bipolar 2 disorder, major depressive episode (HCC) 02/03/2014   Low back pain 05/28/2012   Scoliosis of thoracic spine 03/12/2012   Back pain, thoracic 03/12/2012   Other abnormal glucose 03/12/2012   Nonspecific elevation of levels of transaminase or lactic acid dehydrogenase (LDH) 02/02/2012   Tobacco abuse 01/26/2012   Asthma 01/26/2012   Routine general medical examination at a health care facility 01/21/2011     Referrals to Alternative Service(s): Referred to Alternative  Service(s):   Place:   Date:   Time:    Referred to Alternative Service(s):   Place:   Date:   Time:    Referred to Alternative Service(s):   Place:   Date:   Time:    Referred to Alternative Service(s):   Place:   Date:   Time:     Pamalee Leyden, [facility]

## 2022-04-15 NOTE — ED Provider Notes (Signed)
Behavioral Health Urgent Care Medical Screening Exam  Patient Name: Matthew [name] MRN: [id number] Date of Evaluation: 04/16/22 Chief Complaint:   Diagnosis:  Final diagnoses:  Suicidal thoughts  Episode of recurrent major depressive disorder, unspecified depression episode severity (HCC)  Noncompliance with treatment    History of Present illness: GARO HEIDELBERG [name] is a 51 y.o. male. With a history of Bipolar,  anxiety.  Presented to [facility], with complaints of increased depression, patient stated he had suicidal thoughts today with plans to wreck his car however he did not do it.  According to patient he is currently on treatment with ECT therapy, however he has not had ECT since July.  Patient stated he is seeing a psychiatrist at [facility], patient denies seeing a therapist.  Per the patient he was diagnosed with bipolar disorder and anxiety.  Patient is accompanied by his wife, discussed with both patient and wife that we could keep the patient for overnight but patient did not want to stay.  Discussed with wife to retrieve the keys to the gun safe patient stated he is okay with that.  Patient is asking for something to sleep because he is having decreased sleep.  Collaborate; spoke with Dr.Syed Arfeen, who is on-call presented the case to him and he stated to encourage the patient to stay however if the patient refused to stay try to get his wife involved.  Spoke with patient and wife and patient stated he will keep himself safe he just want to get to ECT on Wednesday so he can start his treatment. Pt also stated he want some medication to sleep.  I discus in length with patient the importance of not drinking alcohol with taking any form of medications.  Pt verbalize understanding.  Pt wife [facility] [phone]) agree to take the keys to the gun safe and keep it away from patient.    Face-to-face observation of patient, patient is alert and oriented x 4, speech is  clear, maintained minimal eye contact.  Mood is depressed affect flat.  Patient reports he was suicidal today with plans to drive into traffic but he did not do it.  Patient denies HI, AVH, or paranoia.  Patient reports he drinks alcohol daily, half of a 1/5 of liquor.  Patient also reports he smoked marijuana daily.  Discussed with patient the importance of not drinking and taking medications.  Patient denies ever being hospitalized for any psychiatric problems. Pt report decrease sleep,  will prescribe Hydroxyzine 50 mg qhs,  patient may take up to 100mg  nightly but to exceed 100 mg.  Pt and wife verbalize understanding.    Will have social works,  schedule pt appointment with ECT on Wednesday.  We will used wife number instead because,  wife stated pt does not answer his phone most of the time.  Both consent to Thursday using wife number for contact.  [phone] Claryce Stanfield).   Meds ordered this encounter  Medications   DISCONTD: hydrOXYzine (ATARAX) tablet 50 mg   hydrOXYzine (ATARAX) 50 MG tablet    Sig: Take 1 tablet (50 mg total) by mouth at bedtime.    Dispense:  30 tablet    Refill:  0    Order Specific Question:   Supervising Provider    Answer:   [phone]       Psychiatric Specialty Exam  Presentation  General Appearance:Appropriate for Environment  Eye Contact:Fair  Speech:Clear and Coherent  Speech Volume:Normal  Handedness:Ambidextrous  Mood and Affect  Mood:Anxious  Affect:Congruent   Thought Process  Thought Processes:Coherent  Descriptions of Associations:Circumstantial  Orientation:Full (Time, Place and Person)  Thought Content:Logical    Hallucinations:None  Ideas of Reference:None  Suicidal Thoughts:Yes, Passive With Intent  Homicidal Thoughts:No   Sensorium  Memory:Immediate Fair  Judgment:Fair  Insight:Fair   Executive Functions  Concentration:Fair  Attention Span:Fair  Recall:Fair  Fund of  Knowledge:Fair  Language:Fair   Psychomotor Activity  Psychomotor Activity:Normal   Assets  Assets:Desire for Improvement   Sleep  Sleep:Fair  Number of hours: No data recorded  No data recorded  Physical Exam: Physical Exam HENT:     Head: Normocephalic.     Nose: Nose normal.  Cardiovascular:     Rate and Rhythm: Normal rate.  Pulmonary:     Effort: Pulmonary effort is normal.  Musculoskeletal:        General: Normal range of motion.     Cervical back: Normal range of motion.  Skin:    General: Skin is warm.  Neurological:     General: No focal deficit present.     Mental Status: He is alert.  Psychiatric:        Mood and Affect: Mood normal.        Behavior: Behavior normal.        Thought Content: Thought content normal.        Judgment: Judgment normal.    Review of Systems  Constitutional: Negative.   HENT: Negative.    Eyes: Negative.   Respiratory: Negative.    Cardiovascular: Negative.   Gastrointestinal: Negative.   Genitourinary: Negative.   Musculoskeletal: Negative.   Skin: Negative.   Neurological: Negative.   Endo/Heme/Allergies: Negative.   Psychiatric/Behavioral:  Positive for suicidal ideas. The patient is nervous/anxious.    Blood pressure 126/86, pulse 96, temperature 98.2 F (36.8 C), temperature source Oral, resp. rate 20, SpO2 95 %. There is no height or weight on file to calculate BMI.  Musculoskeletal: Strength & Muscle Tone: within normal limits Gait & Station: normal Patient leans: N/A   [facility] MSE Discharge Disposition for Follow up and Recommendations: Based on my evaluation the patient does not appear to have an emergency medical condition and can be discharged with resources and follow up care in outpatient services for Medication Management and Individual Therapy   [name], NP 04/16/2022, 5:39 AM

## 2022-04-15 NOTE — Discharge Instructions (Signed)
Pt to f/u with ECT on Wednesday

## 2022-04-15 NOTE — ED Triage Notes (Signed)
Pt presents to [facility] voluntarily, accompanied by his wife with complaints of being depressed and having suicidal thoughts. Pt reports he is currently suicidal, but had a plan earlier (would not disclose) and preferred to share with the doctor. Pt has hx of Bipolar and anxiety. He reports that he has been having suicidal thoughts for a few weeks now, but nothing in particular triggered him to come in tonight. Pt states " I just still don't want to be here". Pt has had alcohol (1/2 a fifth bottle) and marijuana (2 joints). Pt denies HI, AVH at this time.

## 2022-04-18 ENCOUNTER — Other Ambulatory Visit: Payer: Self-pay | Admitting: Psychiatry

## 2022-04-19 ENCOUNTER — Encounter: Payer: Self-pay | Admitting: Anesthesiology

## 2022-04-19 ENCOUNTER — Encounter
Admission: RE | Admit: 2022-04-19 | Discharge: 2022-04-19 | Disposition: A | Discharge status: Home / Self Care | Payer: 59 | Source: Ambulatory Visit | Attending: Family Medicine | Admitting: Family Medicine

## 2022-04-19 ENCOUNTER — Ambulatory Visit: Payer: Self-pay | Admitting: Anesthesiology

## 2022-04-19 DIAGNOSIS — F332 Major depressive disorder, recurrent severe without psychotic features: Secondary | ICD-10-CM

## 2022-04-19 DIAGNOSIS — R41840 Attention and concentration deficit: Secondary | ICD-10-CM | POA: Diagnosis not present

## 2022-04-19 DIAGNOSIS — R37 Sexual dysfunction, unspecified: Secondary | ICD-10-CM | POA: Insufficient documentation

## 2022-04-19 DIAGNOSIS — F5105 Insomnia due to other mental disorder: Secondary | ICD-10-CM | POA: Insufficient documentation

## 2022-04-19 DIAGNOSIS — F410 Panic disorder [episodic paroxysmal anxiety] without agoraphobia: Secondary | ICD-10-CM | POA: Diagnosis not present

## 2022-04-19 DIAGNOSIS — F411 Generalized anxiety disorder: Secondary | ICD-10-CM | POA: Diagnosis not present

## 2022-04-19 DIAGNOSIS — F99 Mental disorder, not otherwise specified: Secondary | ICD-10-CM | POA: Insufficient documentation

## 2022-04-19 DIAGNOSIS — F3181 Bipolar II disorder: Secondary | ICD-10-CM | POA: Insufficient documentation

## 2022-04-19 MED ORDER — SODIUM CHLORIDE 0.9 % IV SOLN
500.0000 mL | Freq: Once | INTRAVENOUS | Status: AC
Start: 2022-04-19 — End: 2022-04-19
  Administered 2022-04-19: 500 mL via INTRAVENOUS

## 2022-04-19 MED ORDER — MIDAZOLAM HCL 2 MG/2ML IJ SOLN: 2.0000 mg | Freq: Once | INTRAMUSCULAR | Status: DC

## 2022-04-19 MED ORDER — METHOHEXITAL SODIUM 100 MG/10ML IV SOSY
PREFILLED_SYRINGE | INTRAVENOUS | Status: DC | PRN
  Administered 2022-04-19: 170 mg via INTRAVENOUS

## 2022-04-19 MED ORDER — SODIUM CHLORIDE 0.9 % IV SOLN: INTRAVENOUS | Status: DC | PRN

## 2022-04-19 MED ORDER — SUCCINYLCHOLINE CHLORIDE 200 MG/10ML IV SOSY
PREFILLED_SYRINGE | INTRAVENOUS | Status: DC | PRN
  Administered 2022-04-19: 150 mg via INTRAVENOUS

## 2022-04-19 MED ORDER — LABETALOL HCL 5 MG/ML IV SOLN
INTRAVENOUS | Status: DC | PRN
  Administered 2022-04-19 (×2): 10 mg via INTRAVENOUS

## 2022-04-19 NOTE — Anesthesia Preprocedure Evaluation (Signed)
Anesthesia Evaluation  Patient identified by MRN, date of birth, ID band Patient awake    Reviewed: Allergy & Precautions, NPO status , Patient's Chart, lab work & pertinent test results  Airway Mallampati: III  TM Distance: >3 FB Neck ROM: full    Dental  (+) Loose,    Pulmonary neg pulmonary ROS, asthma , COPD, Current Smoker and Patient abstained from smoking.,    Pulmonary exam normal        Cardiovascular Exercise Tolerance: Good negative cardio ROS Normal cardiovascular exam Rhythm:Regular     Neuro/Psych Anxiety Depression Bipolar Disorder negative neurological ROS  negative psych ROS   GI/Hepatic negative GI ROS, Neg liver ROS,   Endo/Other  negative endocrine ROS  Renal/GU negative Renal ROS  negative genitourinary   Musculoskeletal   Abdominal Normal abdominal exam  (+)   Peds negative pediatric ROS (+)  Hematology negative hematology ROS (+)   Anesthesia Other Findings Past Medical History: 01/2012: Acute kidney failure (HCC) No date: Anxiety No date: Bipolar disorder (HCC)     Comment:  hypomania No date: COPD (chronic obstructive pulmonary disease) (HCC) No date: Depression No date: Low back pain  Past Surgical History: No date: NO PAST SURGERIES     Reproductive/Obstetrics negative OB ROS                             Anesthesia Physical Anesthesia Plan  ASA: 3  Anesthesia Plan: General   Post-op Pain Management:    Induction: Intravenous  PONV Risk Score and Plan: Propofol infusion and TIVA  Airway Management Planned: Nasal Cannula and Natural Airway  Additional Equipment:   Intra-op Plan:   Post-operative Plan:   Informed Consent: I have reviewed the patients History and Physical, chart, labs and discussed the procedure including the risks, benefits and alternatives for the proposed anesthesia with the patient or authorized representative who has  indicated his/her understanding and acceptance.     Dental Advisory Given  Plan Discussed with: CRNA and Surgeon  Anesthesia Plan Comments:         Anesthesia Quick Evaluation

## 2022-04-19 NOTE — Transfer of Care (Signed)
Immediate Anesthesia Transfer of Care Note  Patient: Matthew [name]  Procedure(s) Performed: ECT TX  Patient Location: PACU  Anesthesia Type:General  Level of Consciousness: drowsy  Airway & Oxygen Therapy: Patient Spontanous Breathing and Patient connected to face mask oxygen  Post-op Assessment: Report given to RN and Post -op Vital signs reviewed and stable  Post vital signs: Reviewed and stable  Last Vitals:  Vitals Value Taken Time  BP 150/110   Temp    Pulse 43 04/19/22 1220  Resp 21 04/19/22 1220  SpO2 94 % 04/19/22 1220  Vitals shown include unvalidated device data.  Last Pain:  Vitals:   04/19/22 1132  TempSrc:   PainSc: 0-No pain         Complications: No notable events documented.

## 2022-04-19 NOTE — Anesthesia Procedure Notes (Signed)
Procedure Name: General with mask airway Date/Time: 04/19/2022 11:55 AM  Performed by: Elmarie Mainland, CRNAPre-anesthesia Checklist: Patient identified, Emergency Drugs available, Suction available and Patient being monitored Patient Re-evaluated:Patient Re-evaluated prior to induction Oxygen Delivery Method: Circle system utilized Preoxygenation: Pre-oxygenation with 100% oxygen

## 2022-04-19 NOTE — Anesthesia Postprocedure Evaluation (Signed)
Anesthesia Post Note  Patient: Matthew [name]  Procedure(s) Performed: ECT TX  Patient location during evaluation: PACU Anesthesia Type: General Level of consciousness: awake and awake and alert Pain management: pain level controlled Vital Signs Assessment: post-procedure vital signs reviewed and stable Respiratory status: spontaneous breathing and nonlabored ventilation Anesthetic complications: no   No notable events documented.   Last Vitals:  Vitals:   04/19/22 1250 04/19/22 1252  BP: 100/61 100/61  Pulse: 78 73  Resp: 17 17  Temp:  36.4 C  SpO2: 96% 97%    Last Pain:  Vitals:   04/19/22 1235  TempSrc:   PainSc: 0-No pain                 VAN STAVEREN,Lyrica Mcclarty

## 2022-04-19 NOTE — H&P (Signed)
Matthew [name] is an 51 y.o. male.   Chief Complaint: "I feel like shit" patient very withdrawn and uncommunicative clearly feeling very bad HPI: History of recurrent depressive episodes  Past Medical History:  Diagnosis Date   Acute kidney failure (HCC) 01/2012   Anxiety    Bipolar disorder (HCC)    hypomania   COPD (chronic obstructive pulmonary disease) (HCC)    Depression    Low back pain     Past Surgical History:  Procedure Laterality Date   NO PAST SURGERIES      Family History  Problem Relation Age of Onset   Anxiety disorder Mother    Alcohol abuse Father    Drug abuse Father    Bipolar disorder Sister    Alcohol abuse Brother    Drug abuse Brother    Bipolar disorder Paternal Aunt    Schizophrenia Paternal Aunt    Cancer Maternal Grandmother        lung   Hypertension Maternal Grandfather    Cancer Paternal Grandmother        lung   Hypertension Paternal Grandfather    Diabetes Neg Hx    Heart disease Neg Hx    Hyperlipidemia Neg Hx    Stroke Neg Hx    Suicidality Neg Hx    Social History:  reports that he has been smoking cigarettes. He has a 40.00 pack-year smoking history. He has never used smokeless tobacco. He reports current alcohol use of about 2.0 standard drinks of alcohol per week. He reports that he does not use drugs.  Allergies:  Allergies  Allergen Reactions   Ciprofloxacin     Acute kidney failure   Codeine Nausea And Vomiting    (Not in a hospital admission)   No results found for this or any previous visit (from the past 48 hour(s)). No results found.  Review of Systems  Constitutional: Negative.   HENT: Negative.    Eyes: Negative.   Respiratory: Negative.    Cardiovascular: Negative.   Gastrointestinal: Negative.   Musculoskeletal: Negative.   Skin: Negative.   Neurological: Negative.   Psychiatric/Behavioral:  Positive for dysphoric mood.     Blood pressure 112/80, pulse 75, temperature (!) 97.5 F (36.4 C),  temperature source Oral, resp. rate 18, height 6\' 5"  (1.956 m), weight 95.3 kg, SpO2 97 %. Physical Exam Vitals and nursing note reviewed.  Constitutional:      Appearance: He is well-developed.  HENT:     Head: Normocephalic and atraumatic.  Eyes:     Conjunctiva/sclera: Conjunctivae normal.     Pupils: Pupils are equal, round, and reactive to light.  Cardiovascular:     Heart sounds: Normal heart sounds.  Pulmonary:     Effort: Pulmonary effort is normal.  Abdominal:     Palpations: Abdomen is soft.  Musculoskeletal:        General: Normal range of motion.     Cervical back: Normal range of motion.  Skin:    General: Skin is warm and dry.  Neurological:     General: No focal deficit present.     Mental Status: He is alert.  Psychiatric:        Attention and Perception: He is inattentive.        Mood and Affect: Mood is depressed. Affect is blunt.        Speech: Speech is delayed.        Behavior: Behavior is withdrawn.      Assessment/Plan ECT  today went well.  Patient was complaining of feeling very bad prior to treatment.  We did not admit him to the hospital but strongly hope that he follows up with treatment next Wednesday  [name], MD 04/19/2022, 5:28 PM

## 2022-04-19 NOTE — Procedures (Signed)
ECT SERVICES Physician's Interval Evaluation & Treatment Note  Patient Identification: Matthew [name] MRN:  [id number] Date of Evaluation:  04/19/2022 TX #: #1 for now, patient gets very intermittent maintenance  MADRS:   MMSE:   P.E. Findings:  Unremarkable physical  Psychiatric Interval Note:  Depressed mood very withdrawn and negativistic  Subjective:  Patient is a 51 y.o. male seen for evaluation for Electroconvulsive Therapy. Feeling very negative and depressed  Treatment Summary:   [x]   Right Unilateral             []  Bilateral   % Energy : 0.3 ms 50%   Impedance: 1980 ohms  Seizure Energy Index: 9610 V squared  Postictal Suppression Index: 73%  Seizure Concordance Index: 92%  Medications  Pre Shock: Toradol 30 mg Brevital 170 mg succinylcholine 150 mg  Post Shock: Versed 2 mg  Seizure Duration: EMG 33 seconds EEG 74 seconds   Comments: Good quality treatment.  Patient is strongly encouraged to come back for treatment Wednesday  Lungs:  [x]   Clear to auscultation               []  Other:   Heart:    [x]   Regular rhythm             []  irregular rhythm    [x]   Previous H&P reviewed, patient examined and there are NO CHANGES                 []   Previous H&P reviewed, patient examined and there are changes noted.   , MD 9/1/20235:35 PM

## 2022-04-24 ENCOUNTER — Other Ambulatory Visit (HOSPITAL_COMMUNITY): Payer: Self-pay | Admitting: Psychiatry

## 2022-04-24 ENCOUNTER — Other Ambulatory Visit (HOSPITAL_COMMUNITY): Payer: Self-pay

## 2022-04-24 DIAGNOSIS — F410 Panic disorder [episodic paroxysmal anxiety] without agoraphobia: Secondary | ICD-10-CM

## 2022-04-24 DIAGNOSIS — F5105 Insomnia due to other mental disorder: Secondary | ICD-10-CM

## 2022-04-24 DIAGNOSIS — F411 Generalized anxiety disorder: Secondary | ICD-10-CM

## 2022-04-25 ENCOUNTER — Telehealth (HOSPITAL_BASED_OUTPATIENT_CLINIC_OR_DEPARTMENT_OTHER): Payer: 59 | Admitting: Psychiatry

## 2022-04-25 ENCOUNTER — Other Ambulatory Visit (HOSPITAL_COMMUNITY): Payer: Self-pay

## 2022-04-25 DIAGNOSIS — F5105 Insomnia due to other mental disorder: Secondary | ICD-10-CM | POA: Diagnosis not present

## 2022-04-25 DIAGNOSIS — F99 Mental disorder, not otherwise specified: Secondary | ICD-10-CM

## 2022-04-25 DIAGNOSIS — F411 Generalized anxiety disorder: Secondary | ICD-10-CM

## 2022-04-25 DIAGNOSIS — F3181 Bipolar II disorder: Secondary | ICD-10-CM | POA: Diagnosis not present

## 2022-04-25 DIAGNOSIS — R37 Sexual dysfunction, unspecified: Secondary | ICD-10-CM | POA: Diagnosis not present

## 2022-04-25 DIAGNOSIS — R41840 Attention and concentration deficit: Secondary | ICD-10-CM

## 2022-04-25 DIAGNOSIS — F410 Panic disorder [episodic paroxysmal anxiety] without agoraphobia: Secondary | ICD-10-CM | POA: Diagnosis not present

## 2022-04-25 MED ORDER — BUSPIRONE HCL 15 MG PO TABS
ORAL_TABLET | ORAL | 0 refills | Status: DC
  Filled 2022-04-25: qty 135, 90d supply, fill #0

## 2022-04-25 MED ORDER — TADALAFIL 10 MG PO TABS
10.0000 mg | ORAL_TABLET | Freq: Every day | ORAL | 1 refills | Status: DC | PRN
  Filled 2022-04-25: qty 10, 10d supply, fill #0

## 2022-04-25 MED ORDER — LORAZEPAM 1 MG PO TABS
1.0000 mg | ORAL_TABLET | Freq: Three times a day (TID) | ORAL | 1 refills | Status: DC | PRN
  Filled 2022-04-25: qty 90, 30d supply, fill #0
  Filled 2022-05-23: qty 90, 30d supply, fill #1

## 2022-04-25 MED ORDER — VENLAFAXINE HCL ER 75 MG PO CP24
225.0000 mg | ORAL_CAPSULE | Freq: Every day | ORAL | 0 refills | Status: DC
  Filled 2022-04-25: qty 120, 40d supply, fill #0
  Filled 2022-04-25: qty 150, 50d supply, fill #0

## 2022-04-25 MED ORDER — ATOMOXETINE HCL 40 MG PO CAPS
40.0000 mg | ORAL_CAPSULE | Freq: Two times a day (BID) | ORAL | 0 refills | Status: DC
  Filled 2022-04-25: qty 60, 30d supply, fill #0
  Filled 2022-04-25 (×2): qty 120, 60d supply, fill #0
  Filled 2022-04-25 (×3): qty 60, 30d supply, fill #0
  Filled 2022-04-25: qty 120, 60d supply, fill #0

## 2022-04-25 MED ORDER — MIRTAZAPINE 15 MG PO TABS
15.0000 mg | ORAL_TABLET | Freq: Every day | ORAL | 1 refills | Status: DC
  Filled 2022-04-25: qty 30, 30d supply, fill #0
  Filled 2022-05-22: qty 30, 30d supply, fill #1

## 2022-04-25 NOTE — Progress Notes (Signed)
Virtual Visit via Video Note  I connected with Matthew [name] on 04/25/22 at  2:15 PM EDT by   a video enabled telemedicine application and verified that I am speaking with the correct person using two identifiers.  Location: Patient: home Provider: office   I discussed the limitations of evaluation and management by telemedicine and the availability of in person appointments. The patient expressed understanding and agreed to proceed.  History of Present Illness: Matthew [name] called the clinic earlier today to schedule a visit for today. He had 1 treatment of ECT last week. He spoke with Dr. Weber Cooks yesterday and told him he was fine and didn't need another treatment. Today he spares that he is leaving [facility]. His last day will be in mid November. He has a plan on what he wants to do after he leaves [facility]. His depression resolved after the ECT treatment last week. He has been having a persistent headaches since then. [name] denies any manic or hypomanic ike symptoms and episodes. He denies SI/HI. His sleep is poor. He is getting about 3 hrs then wakes up and it takes him hours to fall back asleep. Vistaril is not effective. His anxiety is a little better this week but he still struggles with it. Elishah takes Ativan 1/2 tabs TID. He continues to experience of  anhedonia. His appetite is poor and he has been spending a lot of time thinking. [name]'s focus has improved since ECT. He was experiencing a lot of worthlessness but it improved with ECT. He denies SI/HI.    Observations/Objective: Psychiatric Specialty Exam: ROS  There were no vitals taken for this visit.There is no height or weight on file to calculate BMI.  General Appearance: Casual and Fairly Groomed  Eye Contact:  Good  Speech:  Clear and Coherent and Normal Rate  Volume:  Normal  Mood:  Euthymic  Affect:  Full Range  Thought Process:  Goal Directed, Linear, and Descriptions of Associations: Intact  Orientation:  Full (Time, Place,  and Person)  Thought Content:  Logical  Suicidal Thoughts:  No  Homicidal Thoughts:  No  Memory:  Immediate;   Good  Judgement:  Good  Insight:  Good  Psychomotor Activity:  Normal  Concentration:  Concentration: Good  Recall:  Good  Fund of Knowledge:  Good  Language:  Good  Akathisia:  No  Handed:  Right  AIMS (if indicated):     Assets:  Communication Skills Desire for Improvement Financial Resources/Insurance Housing Intimacy Leisure Time Resilience Social Support Talents/Skills Transportation Vocational/Educational  ADL's:  Intact  Cognition:  WNL  Sleep:        Assessment and Plan:     04/25/2022    2:30 PM 03/14/2022    9:00 AM 02/07/2022    8:40 AM 11/15/2021    8:06 AM 10/18/2021    8:15 AM  Depression screen PHQ 2/9  Decreased Interest 3 2 0 0 0  Down, Depressed, Hopeless 1 2 0 1 1  PHQ - 2 Score 4 4 0 1 1  Altered sleeping 3 2     Tired, decreased energy 2 2     Change in appetite 2      Feeling bad or failure about yourself   2     Trouble concentrating 2 2     Moving slowly or fidgety/restless 0 0     Suicidal thoughts 0 2     PHQ-9 Score 13 14     Difficult doing work/chores Extremely  dIfficult Very difficult       Flowsheet Row Video Visit from 04/25/2022 in [facility] E[facility] Treatment from 04/19/2022 in [facility] from [facility]15/2022 in [facility]  C-SSRS RISK CATEGORY Error: Q3, 4, or 5 should not be populated when Q2 is No Low Risk Moderate Risk          Status of current problems: depression has resolved after ECT. He is endorsing insomnia  Meds: d/c Vistaril Start Remeron 15mg  po qHS for insomnia and mood 1. Bipolar 2 disorder (HCC) - atomoxetine (STRATTERA) 40 MG capsule; Take 1 capsule (40 mg total) by mouth 2 (two) times daily with a meal.  Dispense: 360 capsule; Refill: 0 - venlafaxine XR (EFFEXOR-XR) 75 MG 24 hr capsule; Take 3  capsules (225 mg total) by mouth daily with breakfast.  Dispense: 270 capsule; Refill: 0 - mirtazapine (REMERON) 15 MG tablet; Take 1 tablet (15 mg total) by mouth at bedtime.  Dispense: 30 tablet; Refill: 1  2. Poor concentration - atomoxetine (STRATTERA) 40 MG capsule; Take 1 capsule (40 mg total) by mouth 2 (two) times daily with a meal.  Dispense: 360 capsule; Refill: 0  3. GAD (generalized anxiety disorder) - busPIRone (BUSPAR) 15 MG tablet; Take 0.5 tablets (7.5 mg total) by mouth daily with breakfast AND 1 tablet (15 mg total) at bedtime.  Dispense: 135 tablet; Refill: 0 - LORazepam (ATIVAN) 1 MG tablet; Take 1 tablet (1 mg total) by mouth every 8 (eight) hours as needed for anxiety.  Dispense: 90 tablet; Refill: 1 - venlafaxine XR (EFFEXOR-XR) 75 MG 24 hr capsule; Take 3 capsules (225 mg total) by mouth daily with breakfast.  Dispense: 270 capsule; Refill: 0  4. Panic 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 - LORazepam (ATIVAN) 1 MG tablet; Take 1 tablet (1 mg total) by mouth every 8 (eight) hours as needed for anxiety.  Dispense: 90 tablet; Refill: 1 - venlafaxine XR (EFFEXOR-XR) 75 MG 24 hr capsule; Take 3 capsules (225 mg total) by mouth daily with breakfast.  Dispense: 270 capsule; Refill: 0  5. Insomnia due to other mental disorder - LORazepam (ATIVAN) 1 MG tablet; Take 1 tablet (1 mg total) by mouth every 8 (eight) hours as needed for anxiety.  Dispense: 90 tablet; Refill: 1 - mirtazapine (REMERON) 15 MG tablet; Take 1 tablet (15 mg total) by mouth at bedtime.  Dispense: 30 tablet; Refill: 1  6. Sexual dysfunction, unspecified - tadalafil (CIALIS) 10 MG tablet; Take 1 tablet (10 mg total) by mouth daily as needed for erectile dysfunction.  Dispense: 10 tablet; Refill: 1   He is planning on having maintance ECT q6 months or earlier if needed  Labs: none today   Therapy: brief supportive therapy provided. Discussed psychosocial stressors in detail.      Collaboration of Care: Other  none  Patient/Guardian was advised Release of Information must be obtained prior to any record release in order to collaborate their care with an outside provider. Patient/Guardian was advised if they have not already done so to contact the registration department to sign all necessary forms in order for to release information regarding their care.   Consent: Patient/Guardian gives verbal consent for treatment and assignment of benefits for services provided during this visit. Patient/Guardian expressed understanding and agreed to proceed.       Follow Up Instructions: Follow up in 2-3 months or sooner if needed    I discussed the assessment and treatment plan  with the patient. The patient was provided an opportunity to ask questions and all were answered. The patient agreed with the plan and demonstrated an understanding of the instructions.   The patient was advised to call back or seek an in-person evaluation if the symptoms worsen or if the condition fails to improve as anticipated.  I provided 22 minutes of non-face-to-face time during this encounter.   [name], MD

## 2022-04-30 ENCOUNTER — Other Ambulatory Visit (HOSPITAL_COMMUNITY): Payer: Self-pay

## 2022-04-30 MED ORDER — HYDROCODONE-ACETAMINOPHEN 5-325 MG PO TABS
1.0000 | ORAL_TABLET | ORAL | 0 refills | Status: DC | PRN
  Filled 2022-04-30: qty 6, 1d supply, fill #0

## 2022-05-09 ENCOUNTER — Other Ambulatory Visit (HOSPITAL_COMMUNITY): Payer: Self-pay

## 2022-05-22 ENCOUNTER — Other Ambulatory Visit (HOSPITAL_COMMUNITY): Payer: Self-pay

## 2022-05-23 ENCOUNTER — Other Ambulatory Visit (HOSPITAL_COMMUNITY): Payer: Self-pay

## 2022-06-20 ENCOUNTER — Other Ambulatory Visit (HOSPITAL_COMMUNITY): Payer: Self-pay

## 2022-06-20 ENCOUNTER — Other Ambulatory Visit (HOSPITAL_COMMUNITY): Payer: Self-pay | Admitting: Psychiatry

## 2022-06-20 ENCOUNTER — Telehealth (HOSPITAL_COMMUNITY): Payer: Self-pay | Admitting: *Deleted

## 2022-06-20 DIAGNOSIS — F5105 Insomnia due to other mental disorder: Secondary | ICD-10-CM

## 2022-06-20 DIAGNOSIS — F411 Generalized anxiety disorder: Secondary | ICD-10-CM

## 2022-06-20 DIAGNOSIS — F410 Panic disorder [episodic paroxysmal anxiety] without agoraphobia: Secondary | ICD-10-CM

## 2022-06-20 MED ORDER — LORAZEPAM 1 MG PO TABS
1.0000 mg | ORAL_TABLET | Freq: Three times a day (TID) | ORAL | 0 refills | Status: DC | PRN
  Filled 2022-06-20: qty 90, 30d supply, fill #0

## 2022-06-20 NOTE — Telephone Encounter (Signed)
Pt called requesting a refill of the Lorazepam 1 mg last e-scribed on last visit 04/25/22 for # 90. Pt's next appointment is scheduled for 07/25/22. Please review.

## 2022-06-21 ENCOUNTER — Other Ambulatory Visit (HOSPITAL_COMMUNITY): Payer: Self-pay

## 2022-06-24 ENCOUNTER — Other Ambulatory Visit (HOSPITAL_COMMUNITY): Payer: Self-pay

## 2022-06-24 ENCOUNTER — Telehealth (HOSPITAL_COMMUNITY): Payer: Self-pay | Admitting: *Deleted

## 2022-06-24 NOTE — Telephone Encounter (Signed)
Writer received a VM that was left after closing on Friday 06/21/22 @ 1645, requesting refill of the Ativan 1 mg. Last e-scribed on 06/20/22 to [facility] on [address]. Writer called back however there wa sno answer and no VM picked up. Pharmacy verified script was picked up 06/21/22.

## 2022-06-27 ENCOUNTER — Telehealth (HOSPITAL_COMMUNITY): Payer: 59 | Admitting: Psychiatry

## 2022-07-22 ENCOUNTER — Telehealth (HOSPITAL_COMMUNITY): Payer: Self-pay | Admitting: *Deleted

## 2022-07-22 ENCOUNTER — Other Ambulatory Visit (HOSPITAL_COMMUNITY): Payer: Self-pay

## 2022-07-22 ENCOUNTER — Other Ambulatory Visit (HOSPITAL_COMMUNITY): Payer: Self-pay | Admitting: Psychiatry

## 2022-07-22 ENCOUNTER — Other Ambulatory Visit (HOSPITAL_COMMUNITY): Payer: Self-pay | Admitting: *Deleted

## 2022-07-22 DIAGNOSIS — F5105 Insomnia due to other mental disorder: Secondary | ICD-10-CM

## 2022-07-22 DIAGNOSIS — F410 Panic disorder [episodic paroxysmal anxiety] without agoraphobia: Secondary | ICD-10-CM

## 2022-07-22 DIAGNOSIS — F411 Generalized anxiety disorder: Secondary | ICD-10-CM

## 2022-07-22 MED ORDER — LORAZEPAM 1 MG PO TABS
1.0000 mg | ORAL_TABLET | Freq: Three times a day (TID) | ORAL | 0 refills | Status: DC | PRN
  Filled 2022-07-22: qty 12, 4d supply, fill #0

## 2022-07-22 MED ORDER — LORAZEPAM 1 MG PO TABS: 1.0000 mg | ORAL_TABLET | Freq: Three times a day (TID) | ORAL | 0 refills | Status: DC | PRN

## 2022-07-22 NOTE — Telephone Encounter (Signed)
Pt called requesting a refill, today, of Ativan 1 mg. Last script sent to pharmacy on 06/20/22 after visit same day. Next pt appointment is scheduled for 07/25/22. Pt is out of medication. Please review.

## 2022-07-22 NOTE — Telephone Encounter (Signed)
Refill with enough to bridge to appointment

## 2022-07-25 ENCOUNTER — Telehealth (HOSPITAL_BASED_OUTPATIENT_CLINIC_OR_DEPARTMENT_OTHER): Payer: Self-pay | Admitting: Psychiatry

## 2022-07-25 ENCOUNTER — Encounter (HOSPITAL_COMMUNITY): Payer: Self-pay | Admitting: Psychiatry

## 2022-07-25 DIAGNOSIS — F411 Generalized anxiety disorder: Secondary | ICD-10-CM

## 2022-07-25 DIAGNOSIS — F410 Panic disorder [episodic paroxysmal anxiety] without agoraphobia: Secondary | ICD-10-CM

## 2022-07-25 DIAGNOSIS — F5105 Insomnia due to other mental disorder: Secondary | ICD-10-CM

## 2022-07-25 DIAGNOSIS — F3181 Bipolar II disorder: Secondary | ICD-10-CM

## 2022-07-25 DIAGNOSIS — R41840 Attention and concentration deficit: Secondary | ICD-10-CM

## 2022-07-25 MED ORDER — LORAZEPAM 1 MG PO TABS: 1.0000 mg | ORAL_TABLET | Freq: Two times a day (BID) | ORAL | 0 refills | Status: DC

## 2022-07-25 MED ORDER — BUSPIRONE HCL 15 MG PO TABS: ORAL_TABLET | ORAL | 0 refills | Status: DC

## 2022-07-25 MED ORDER — ATOMOXETINE HCL 40 MG PO CAPS: 40.0000 mg | ORAL_CAPSULE | Freq: Two times a day (BID) | ORAL | 0 refills | Status: DC

## 2022-07-25 MED ORDER — QUETIAPINE FUMARATE 100 MG PO TABS: 100.0000 mg | ORAL_TABLET | Freq: Two times a day (BID) | ORAL | 0 refills | Status: DC

## 2022-07-25 MED ORDER — VENLAFAXINE HCL ER 75 MG PO CP24: 225.0000 mg | ORAL_CAPSULE | Freq: Every day | ORAL | 0 refills | Status: DC

## 2022-07-25 NOTE — Progress Notes (Signed)
Virtual Visit via Video Note  I connected with Matthew [name] on 07/25/22 at  8:45 AM EST by  a video enabled telemedicine application and verified that I am speaking with the correct person using two identifiers.  Location: Patient: work Provider: office   I discussed the limitations of evaluation and management by telemedicine and the availability of in person appointments. The patient expressed understanding and agreed to proceed.  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 It needs a lot of work and he has a Clinical research associate. Matthew [name] likes the job so far. Matthew [name] shares that leaving his previous job was the best thing he could do for his mental health. He is working on his maladaptive habits of marijuana and alcohol to cope with his overwhelming anxiety. 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 Since he has left the last job he is happy and has been clean for a month. He is able to express himself and feels that he is able to get his point across. Matthew [name] still has some issues with anxiety and is using Ativan TID. In the last one month he has only had 2 stress induced panic attacks. He tried to wean down off Ativan for 1 week but went back up because his anxiety was high and lead to depression. 1 week he felt like things were going wrong at work and it lead to a lot of paranoia about them trying to sabotage him. He confronted his co-workers and cleared up the misunderstanding. Matthew [name] is working on between him and his wife. He last felt depressed about 2 weeks ago after some upsetting things happening at work or at home. Matthew [name] felt down and had some fleeting SI without plan or intent. Today he denies SI/HI. Pt denies recent manic and hypomanic symptoms including periods of decreased need for sleep, increased energy, mood lability, impulsivity, FOI, and excessive spending. Matthew [name] is focusing on working and  providing for his family. His sleep is "horrible". He took Remeron and it didn't help. Most nights he sleeps about 4-6 hrs/nigh. He is having vivid dreams that also wake him up. He has been Buspar TID.     Observations/Objective: Psychiatric Specialty Exam: ROS  There were no vitals taken for this visit.There is no height or weight on file to calculate BMI.  General Appearance: Neat and Well Groomed  Eye Contact:  Good  Speech:  Clear and Coherent and Normal Rate  Volume:  Normal  Mood:  Anxious  Affect:  Blunt  Thought Process:  Goal Directed, Linear, and Descriptions of Associations: Intact  Orientation:  Full (Time, Place, and Person)  Thought Content:  Logical  Suicidal Thoughts:  No  Homicidal Thoughts:  No  Memory:  Immediate;   Good  Judgement:  Good  Insight:  Good  Psychomotor Activity:  Normal  Concentration:  Concentration: Good  Recall:  Good  Fund of Knowledge:  Good  Language:  Good  Akathisia:  No  Handed:  Right  AIMS (if indicated):     Assets:  Communication Skills Desire for Improvement Financial Resources/Insurance Housing Intimacy Leisure Time Resilience Social Support Talents/Skills Transportation Vocational/Educational  ADL's:  Intact  Cognition:  WNL  Sleep:        Assessment and Plan:     07/25/2022    9:01 AM 04/25/2022    2:30 PM 03/14/2022    9:00  AM 02/07/2022    8:40 AM 11/15/2021    8:06 AM  Depression screen PHQ 2/9  Decreased Interest 0 3 2 0 0  Down, Depressed, Hopeless 0 1 2 0 1  PHQ - 2 Score 0 4 4 0 1  Altered sleeping  3 2    Tired, decreased energy  2 2    Change in appetite  2     Feeling bad or failure about yourself    2    Trouble concentrating  2 2    Moving slowly or fidgety/restless  0 0    Suicidal thoughts  0 2    PHQ-9 Score  13 14    Difficult doing work/chores  Extremely dIfficult Very difficult      Flowsheet Row Video Visit from 07/25/2022 in [facility] Video  Visit from 04/25/2022 in [facility] ECT Treatment from 04/19/2022 in [facility]  C-SSRS RISK CATEGORY No Risk Error: Q3, 4, or 5 should not be populated when Q2 is No Low Risk          Status of current problems: ongoing anxiety  Meds: start decreasing Ativan with plan to eventually discontinue.  Increase Buspar 15mg  TID Start Seroquel 100mg  po qHS for anxiety, mood stabilization and insomnia 1. Bipolar 2 disorder (HCC) - atomoxetine (STRATTERA) 40 MG capsule; Take 1 capsule (40 mg total) by mouth 2 (two) times daily with a meal.  Dispense: 360 capsule; Refill: 0 - venlafaxine XR (EFFEXOR-XR) 75 MG 24 hr capsule; Take 3 capsules (225 mg total) by mouth daily with breakfast.  Dispense: 270 capsule; Refill: 0 - QUEtiapine (SEROQUEL) 100 MG tablet; Take 1 tablet (100 mg total) by mouth 2 (two) times daily.  Dispense: 60 tablet; Refill: 0  2. Poor concentration - atomoxetine (STRATTERA) 40 MG capsule; Take 1 capsule (40 mg total) by mouth 2 (two) times daily with a meal.  Dispense: 360 capsule; Refill: 0  3. GAD (generalized anxiety disorder) - busPIRone (BUSPAR) 15 MG tablet; 1 tab po TID  Dispense: 135 tablet; Refill: 0 - LORazepam (ATIVAN) 1 MG tablet; Take 1 tablet (1 mg total) by mouth 2 (two) times daily.  Dispense: 60 tablet; Refill: 0 - venlafaxine XR (EFFEXOR-XR) 75 MG 24 hr capsule; Take 3 capsules (225 mg total) by mouth daily with breakfast.  Dispense: 270 capsule; Refill: 0  4. Panic 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 - LORazepam (ATIVAN) 1 MG tablet; Take 1 tablet (1 mg total) by mouth 2 (two) times daily.  Dispense: 60 tablet; Refill: 0 - venlafaxine XR (EFFEXOR-XR) 75 MG 24 hr capsule; Take 3 capsules (225 mg total) by mouth daily with breakfast.  Dispense: 270 capsule; Refill: 0  5. Insomnia due to other mental disorder - QUEtiapine (SEROQUEL) 100 MG tablet; Take 1 tablet (100 mg total) by mouth 2 (two)  times daily.  Dispense: 60 tablet; Refill: 0     Labs: none    Therapy: brief supportive therapy provided. Discussed psychosocial stressors in detail.    Recommended pt stop all drug and alcohol use   Collaboration of Care: referral for therapy for anxiety and insomnia  Patient/Guardian was advised Release of Information must be obtained prior to any record release in order to collaborate their care with an outside provider. Patient/Guardian was advised if they have not already done so to contact the registration department to sign all necessary forms in order for to release information regarding their care.  Consent: Patient/Guardian gives verbal consent for treatment and assignment of benefits for services provided during this visit. Patient/Guardian expressed understanding and agreed to proceed.     Follow Up Instructions: Follow up in 2 weeks or sooner if needed    I discussed the assessment and treatment plan with the patient. The patient was provided an opportunity to ask questions and all were answered. The patient agreed with the plan and demonstrated an understanding of the instructions.   The patient was advised to call back or seek an in-person evaluation if the symptoms worsen or if the condition fails to improve as anticipated.  I provided 30 minutes of non-face-to-face time during this encounter.   [name], MD

## 2022-07-26 ENCOUNTER — Telehealth (HOSPITAL_COMMUNITY): Payer: Self-pay | Admitting: Psychiatry

## 2022-08-05 ENCOUNTER — Telehealth (HOSPITAL_COMMUNITY): Payer: Self-pay | Admitting: Psychiatry

## 2022-08-08 ENCOUNTER — Telehealth (HOSPITAL_BASED_OUTPATIENT_CLINIC_OR_DEPARTMENT_OTHER): Payer: Self-pay | Admitting: Psychiatry

## 2022-08-08 DIAGNOSIS — F3181 Bipolar II disorder: Secondary | ICD-10-CM

## 2022-08-08 DIAGNOSIS — F411 Generalized anxiety disorder: Secondary | ICD-10-CM

## 2022-08-08 DIAGNOSIS — F5105 Insomnia due to other mental disorder: Secondary | ICD-10-CM

## 2022-08-08 DIAGNOSIS — F99 Mental disorder, not otherwise specified: Secondary | ICD-10-CM

## 2022-08-08 DIAGNOSIS — F410 Panic disorder [episodic paroxysmal anxiety] without agoraphobia: Secondary | ICD-10-CM

## 2022-08-08 MED ORDER — LORAZEPAM 1 MG PO TABS: 1.0000 mg | ORAL_TABLET | Freq: Every day | ORAL | 1 refills | Status: DC | PRN

## 2022-08-08 MED ORDER — QUETIAPINE FUMARATE 50 MG PO TABS: 100.0000 mg | ORAL_TABLET | Freq: Every day | ORAL | 0 refills | Status: DC

## 2022-08-08 NOTE — Progress Notes (Signed)
Virtual Visit via Video Note  I connected with Matthew [name] on 08/08/22 at  1:30 PM EST by  a video enabled telemedicine application and verified that I am speaking with the correct person using two identifiers.  Location: Patient: Home Provider: office   I discussed the limitations of evaluation and management by telemedicine and the availability of in person appointments. The patient expressed understanding and agreed to proceed.  History of Present Illness: I saw Matthew [name] 2 weeks ago. I added Seroquel and increased Buspar. I am decreasing Ativan with goal to taper off. He is now taking Ativan twice a day and thinks it is due to habit. He is not always taking it because he is anxious. Matthew [name] shares that the Buspar is helping.  Overall the anxiety is not as bad. He has some panic like moments in the evenings but he manages it. The Seroquel is too strong and he feels very tired for 6 hrs the next morning. His sleep is still fragmented. He will sleep for 3 hrs and then wake up due to vivid dreams. He will be up for an hour and then will sleep for another 2-3 hrs and then wakes up for work. His mood is stable. He denies depression symptoms including anhedonia and hopelessness. She denies manic and hypomanic like symptoms. Matthew [name] denies SI/HI. 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 His focus is good.    Observations/Objective: Psychiatric Specialty Exam: ROS  There were no vitals taken for this visit.There is no height or weight on file to calculate BMI.  General Appearance: Casual and Fairly Groomed  Eye Contact:  Good  Speech:  Clear and Coherent and Normal Rate  Volume:  Normal  Mood:  Anxious  Affect:  Blunt  Thought Process:  Goal Directed, Linear, and Descriptions of Associations: Intact  Orientation:  Full (Time, Place, and Person)  Thought Content:  Logical  Suicidal Thoughts:  No  Homicidal Thoughts:  No  Memory:  Immediate;   Good  Judgement:  Good  Insight:  Good  Psychomotor  Activity:  Normal  Concentration:  Concentration: Good  Recall:  Good  Fund of Knowledge:  Good  Language:  Good  Akathisia:  No  Handed:  Right  AIMS (if indicated):     Assets:  Communication Skills Desire for Improvement Financial Resources/Insurance Housing Intimacy Leisure Time Resilience Social Support Talents/Skills Transportation Vocational/Educational  ADL's:  Intact  Cognition:  WNL  Sleep:        Assessment and Plan:     07/25/2022    9:01 AM 04/25/2022    2:30 PM 03/14/2022    9:00 AM 02/07/2022    8:40 AM 11/15/2021    8:06 AM  Depression screen PHQ 2/9  Decreased Interest 0 3 2 0 0  Down, Depressed, Hopeless 0 1 2 0 1  PHQ - 2 Score 0 4 4 0 1  Altered sleeping  3 2    Tired, decreased energy  2 2    Change in appetite  2     Feeling bad or failure about yourself    2    Trouble concentrating  2 2    Moving slowly or fidgety/restless  0 0    Suicidal thoughts  0 2    PHQ-9 Score  13 14    Difficult doing work/chores  Extremely dIfficult Very difficult      Flowsheet Row [facility] 07/25/2022 in [facility] Video Visit from  04/25/2022 in [facility] ECT Treatment from 04/19/2022 in [facility]  C-SSRS RISK CATEGORY No Risk Error: Q3, 4, or 5 should not be populated when Q2 is No Low Risk        Status of current problems: anxiety is decreased but still ongoing. Sleep remains poor. He denies depression.   Meds: decrease Ativan to 1mg  po D prn anxiety Decrease Seroquel 50mg  po qHS Continue Straterra 40mg  po BID Continue Effexor XR 225mg  po qD Continue Buspar 15mg  TID 1. Bipolar 2 disorder (HCC) - QUEtiapine (SEROQUEL) 50 MG tablet; Take 2 tablets (100 mg total) by mouth at bedtime.  Dispense: 180 tablet; Refill: 0  2. Insomnia due to other mental disorder - QUEtiapine (SEROQUEL) 50 MG tablet; Take 2 tablets (100 mg total) by mouth at  bedtime.  Dispense: 180 tablet; Refill: 0  3. GAD (generalized anxiety disorder) - LORazepam (ATIVAN) 1 MG tablet; Take 1 tablet (1 mg total) by mouth daily as needed for anxiety.  Dispense: 30 tablet; Refill: 1  4. Panic 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 - LORazepam (ATIVAN) 1 MG tablet; Take 1 tablet (1 mg total) by mouth daily as needed for anxiety.  Dispense: 30 tablet; Refill: 1    Therapy: brief supportive therapy provided. Discussed psychosocial stressors in detail.     Collaboration of Care: Other none  Patient/Guardian was advised Release of Information must be obtained prior to any record release in order to collaborate their care with an outside provider. Patient/Guardian was advised if they have not already done so to contact the registration department to sign all necessary forms in order for to release information regarding their care.   Consent: Patient/Guardian gives verbal consent for treatment and assignment of benefits for services provided during this visit. Patient/Guardian expressed understanding and agreed to proceed.       Follow Up Instructions: Follow up in 2-3 weeks  or sooner if needed    I discussed the assessment and treatment plan with the patient. The patient was provided an opportunity to ask questions and all were answered. The patient agreed with the plan and demonstrated an understanding of the instructions.   The patient was advised to call back or seek an in-person evaluation if the symptoms worsen or if the condition fails to improve as anticipated.  I provided 19 minutes of non-face-to-face time during this encounter.   , MD

## 2022-08-31 ENCOUNTER — Other Ambulatory Visit (HOSPITAL_COMMUNITY): Payer: Self-pay | Admitting: Psychiatry

## 2022-08-31 DIAGNOSIS — F411 Generalized anxiety disorder: Secondary | ICD-10-CM

## 2022-09-05 ENCOUNTER — Encounter (HOSPITAL_COMMUNITY): Payer: Self-pay | Admitting: Psychiatry

## 2022-09-05 ENCOUNTER — Telehealth (HOSPITAL_COMMUNITY): Payer: 59 | Admitting: Psychiatry

## 2022-09-05 DIAGNOSIS — F411 Generalized anxiety disorder: Secondary | ICD-10-CM

## 2022-09-05 DIAGNOSIS — F3181 Bipolar II disorder: Secondary | ICD-10-CM

## 2022-09-05 DIAGNOSIS — F5105 Insomnia due to other mental disorder: Secondary | ICD-10-CM | POA: Diagnosis not present

## 2022-09-05 DIAGNOSIS — F410 Panic disorder [episodic paroxysmal anxiety] without agoraphobia: Secondary | ICD-10-CM | POA: Diagnosis not present

## 2022-09-05 DIAGNOSIS — F99 Mental disorder, not otherwise specified: Secondary | ICD-10-CM

## 2022-09-05 MED ORDER — AUVELITY 45-105 MG PO TBCR: EXTENDED_RELEASE_TABLET | ORAL | 0 refills | Status: AC

## 2022-09-05 MED ORDER — AUVELITY 45-105 MG PO TBCR: EXTENDED_RELEASE_TABLET | ORAL | 0 refills | Status: DC

## 2022-09-05 MED ORDER — LORAZEPAM 1 MG PO TABS: 1.0000 mg | ORAL_TABLET | Freq: Three times a day (TID) | ORAL | 0 refills | Status: DC | PRN

## 2022-09-05 NOTE — Progress Notes (Signed)
Virtual Visit via Video Note  I connected with Matthew [name] on 09/05/22 at 11:00 AM EST by  a video enabled telemedicine application and verified that I am speaking with the correct person using two identifiers.  Location: Patient: in parked car Provider: office   I discussed the limitations of evaluation and management by telemedicine and the availability of in person appointments. The patient expressed understanding and agreed to proceed.  History of Present Illness: Matthew [name] shares he decreased Ativan to BID as recommended and ran out.  He then decreased to 1 day as recommended. He feels his anxiety is high and that caused him to feel depressed. He denies panic attacks but has increased chest pain. He was slowly losing motivation and wanting to increase isolation. The last few days he has increased Ativan BID and it has helped. When taking Ativan BID he does not experience chest pain at night, he has no GI upset and is a little more motivated to do things. He feels a little better on BID, with only taking it once daily he was experiencing all that at night. Even with twice a day he is irritable and only minimally motivated. The depression is still getting worse. He spend all day in bed last Thursday and Friday. His stress tolerance is very low and everything takes longer to do. Matthew [name] is sick of everything and everyone but denies SI/HI. No one has really criticized him but he feels upset because his anxiety is super high and he is questioning everything he does. He finds it difficult to makes decisions and is feeling trapped. Pt denies recent manic and hypomanic symptoms including periods of decreased need for sleep, increased energy, mood lability, impulsivity, FOI, and excessive spending. His sleep has decreased to 3-4 hrs since Monday of this week. Last week he was sleeping more than usual. His energy is variable. His appetite is decreased. The last alcoholic drink was in October 2023. He denies  illicit substance abuse. He is smoking 2 ppd. Matthew [name] shares that he hates getting ECT treatments and would prefer to use meds if possible. He will discuss it with his wife and schedule an ECT treatment if she thinks he needs it.      Observations/Objective: Psychiatric Specialty Exam: ROS  There were no vitals taken for this visit.There is no height or weight on file to calculate BMI.  General Appearance: Fairly Groomed and Neat  Eye Contact:  Good  Speech:  Clear and Coherent and Normal Rate  Volume:  Normal  Mood:  Angry, Anxious, Depressed, and Irritable  Affect:  Congruent  Thought Process:  Coherent and Descriptions of Associations: Circumstantial  Orientation:  Full (Time, Place, and Person)  Thought Content:  Rumination  Suicidal Thoughts:  No  Homicidal Thoughts:  No  Memory:  Immediate;   Good  Judgement:  Fair  Insight:  Fair  Psychomotor Activity:  Normal  Concentration:  Concentration: Fair  Recall:  Matthew [name] of Knowledge:  Good  Language:  Good  Akathisia:  No  Handed:  Right  AIMS (if indicated):     Assets:  Communication Skills Desire for Improvement Financial Resources/Insurance Housing Intimacy Leisure Time Resilience Social Support Talents/Skills Transportation Vocational/Educational  ADL's:  Intact  Cognition:  WNL  Sleep:        Assessment and Plan:     09/05/2022   11:18 AM 07/25/2022    9:01 AM 04/25/2022    2:30 PM 03/14/2022    9:00 AM 02/07/2022  8:40 AM  Depression screen PHQ 2/9  Decreased Interest 3 0 3 2 0  Down, Depressed, Hopeless 3 0 1 2 0  PHQ - 2 Score 6 0 4 4 0  Altered sleeping 3  3 2    Tired, decreased energy 3  2 2    Change in appetite 3  2    Feeling bad or failure about yourself  3   2   Trouble concentrating 3  2 2    Moving slowly or fidgety/restless 0  0 0   Suicidal thoughts 2  0 2   PHQ-9 Score 23  13 14    Difficult doing work/chores Extremely dIfficult  Extremely dIfficult Very difficult     Flowsheet Row  Video Visit [facility] 09/05/2022 in [facility] Video Visit from 07/25/2022 in [facility] Video Visit from 04/25/2022 in [facility]  C-SSRS RISK CATEGORY Error: Question 6 not populated No Risk Error: Q3, 4, or 5 should not be populated when Q2 is No          Pt is aware that these meds carry a teratogenic risk. Pt will discuss plan of action if she does or plans to become pregnant in the future.  Status of current problems: worsening anxiety and depression symptoms   Medication management with supportive therapy. Risks and benefits, side effects and alternative treatment options discussed with patient. Pt was given an opportunity to ask questions about medication, illness, and treatment. All current psychiatric medications have been reviewed and discussed with the patient and adjusted as clinically appropriate.  Pt verbalized understanding and verbal consent obtained for treatment.  Meds: increase Ativan 1mg  po TID prn anxiety temporarily. We discussed how Ativan is only supposed to be used sparingly and how it does not address the underlying cause of the anxiety. The goal is to get him off of it eventually.   D/C Effexor - cross taper 150mg  po 3 days then decrease to 75mg  for 4 days  Start Auvelity 45mg - 105mg  po qD for depression Continue Buspar, Seroquel, Strattera at current doses.   Previous failed drug trials- Effexor, Celexa, Remeron, Lexapro, Wellbutrin, Paxil, Depakote, Lithium, Tegretol, Abilify, Seroquel, Gabapentin 1. GAD (generalized anxiety disorder) - LORazepam (ATIVAN) 1 MG tablet; Take 1 tablet (1 mg total) by mouth every 8 (eight) hours as needed for anxiety.  Dispense: 90 tablet; Refill: 0  2. Bipolar 2 disorder (HCC) - Dextromethorphan-buPROPion ER (AUVELITY) 45-105 MG TBCR; Take 1 tablet by mouth daily in the afternoon for 3 days, THEN 1 tablet 2 (two) times  daily for 27 days.  Dispense: 60 tablet; Refill: 0 - LORazepam (ATIVAN) 1 MG tablet; Take 1 tablet (1 mg total) by mouth every 8 (eight) hours as needed for anxiety.  Dispense: 90 tablet; Refill: 0  3. Insomnia due to other mental disorder  4. Panic 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 - LORazepam (ATIVAN) 1 MG tablet; Take 1 tablet (1 mg total) by mouth every 8 (eight) hours as needed for anxiety.  Dispense: 90 tablet; Refill: 0     Labs: none today    Therapy: brief supportive therapy provided. Discussed psychosocial stressors in detail.    Collaboration of Care: Other encouraged to schedule ECT treatment . We discussed previous presentations and signs that his depression is worsening. Typically when he starts talking about feeling trapped and wanting to get away from everything that is a sign the depression is overwhelming and he may benefit from an ECT treatment.  Patient/Guardian was advised Release of Information must be obtained prior to any record release in order to collaborate their care with an outside provider. Patient/Guardian was advised if they have not already done so to contact the registration department to sign all necessary forms in order for [place] to release information regarding their care.   Consent: Patient/Guardian gives verbal consent for treatment and assignment of benefits for services provided during this visit. Patient/Guardian expressed understanding and agreed to proceed.     Pt's acute risk factors for suicide are worsening depression and anxiety symptoms. Pt's chronic risk factors are family hx of mental illness. Pt's protective factors are lack of current substance abuse, positive social support, denying hx of suicide attempts, compliance with meds and follow up, good therapeutic relationship and denying thoughts of suicide. Pt denies SI and is at an acute low risk for suicide. Patient told to call clinic if any problems occur. Patient advised to go to ER if they  should develop SI/HI, side effects, or if symptoms worsen. Pt has crisis numbers to call if needed. Pt acknowledged and agreed with plan and verbalized understanding.  Follow Up Instructions: Follow up in 3 weeks or sooner if needed    I discussed the assessment and treatment plan with the patient. The patient was provided an opportunity to ask questions and all were answered. The patient agreed with the plan and demonstrated an understanding of the instructions.   The patient was advised to call back or seek an in-person evaluation if the symptoms worsen or if the condition fails to improve as anticipated.  I provided 34 minutes of non-face-to-face time during this encounter.   Charlcie Cradle, MD

## 2022-09-06 ENCOUNTER — Telehealth (HOSPITAL_COMMUNITY): Payer: Self-pay

## 2022-09-06 NOTE — Telephone Encounter (Signed)
Received a fax to initiate a PA for the patient for [employer] for plan: Patient not found Called patient to ask for insurance info no answer got a message that my call could not be completed made 3 attempts to reach patient.

## 2022-09-09 ENCOUNTER — Other Ambulatory Visit (HOSPITAL_COMMUNITY): Payer: Self-pay | Admitting: Psychiatry

## 2022-09-09 ENCOUNTER — Telehealth (HOSPITAL_COMMUNITY): Payer: Self-pay | Admitting: *Deleted

## 2022-09-09 NOTE — Telephone Encounter (Signed)
Pt called requesting a new script for the Ativan 1 mg with SIG stating to take TID PRN. Writer reviewed with Dr. Doyne Keel and SIG will remain the same: 1 tab Q8 hr PRN. Rx was written for #90 and provider discussed with pt that TID would only be temporary. Writer and CMA also are continuing to try and complete PA for Auvelity 45-105 TBCR and want to advise pt however he has no VM set up and caller gets message that call cannot be completed.

## 2022-09-11 ENCOUNTER — Other Ambulatory Visit (HOSPITAL_COMMUNITY): Payer: Self-pay | Admitting: *Deleted

## 2022-09-11 ENCOUNTER — Telehealth (HOSPITAL_COMMUNITY): Payer: Self-pay | Admitting: *Deleted

## 2022-09-11 DIAGNOSIS — F411 Generalized anxiety disorder: Secondary | ICD-10-CM

## 2022-09-11 DIAGNOSIS — F3181 Bipolar II disorder: Secondary | ICD-10-CM

## 2022-09-11 DIAGNOSIS — F410 Panic disorder [episodic paroxysmal anxiety] without agoraphobia: Secondary | ICD-10-CM

## 2022-09-11 MED ORDER — LORAZEPAM 1 MG PO TABS: 1.0000 mg | ORAL_TABLET | Freq: Three times a day (TID) | ORAL | 0 refills | Status: DC | PRN

## 2022-09-11 NOTE — Telephone Encounter (Signed)
Pt called requesting that script for Lorazepam 1 mg be sent to [id number] in [place] as he is currently working there. Per Dr. Havery Moros [facility] [address]. Has been d/c or Probation officer gave VO to pharmacist at the [facility] [phone].

## 2022-09-26 ENCOUNTER — Telehealth (HOSPITAL_COMMUNITY): Payer: Self-pay | Admitting: Psychiatry

## 2022-10-15 ENCOUNTER — Other Ambulatory Visit (HOSPITAL_COMMUNITY): Payer: Self-pay | Admitting: Psychiatry

## 2022-10-15 DIAGNOSIS — F3181 Bipolar II disorder: Secondary | ICD-10-CM

## 2022-10-15 DIAGNOSIS — F410 Panic disorder [episodic paroxysmal anxiety] without agoraphobia: Secondary | ICD-10-CM

## 2022-10-15 DIAGNOSIS — F411 Generalized anxiety disorder: Secondary | ICD-10-CM

## 2022-10-29 ENCOUNTER — Telehealth (HOSPITAL_COMMUNITY): Payer: Self-pay | Admitting: Psychiatry

## 2022-10-31 ENCOUNTER — Other Ambulatory Visit (HOSPITAL_COMMUNITY): Payer: Self-pay | Admitting: Psychiatry

## 2022-10-31 ENCOUNTER — Telehealth (HOSPITAL_COMMUNITY): Payer: Self-pay | Admitting: *Deleted

## 2022-10-31 ENCOUNTER — Telehealth (HOSPITAL_COMMUNITY): Payer: 59 | Admitting: Psychiatry

## 2022-10-31 DIAGNOSIS — R41840 Attention and concentration deficit: Secondary | ICD-10-CM | POA: Diagnosis not present

## 2022-10-31 DIAGNOSIS — F3181 Bipolar II disorder: Secondary | ICD-10-CM

## 2022-10-31 DIAGNOSIS — F410 Panic disorder [episodic paroxysmal anxiety] without agoraphobia: Secondary | ICD-10-CM | POA: Diagnosis not present

## 2022-10-31 DIAGNOSIS — F411 Generalized anxiety disorder: Secondary | ICD-10-CM

## 2022-10-31 MED ORDER — LORAZEPAM 1 MG PO TABS
1.0000 mg | ORAL_TABLET | Freq: Three times a day (TID) | ORAL | 0 refills | Status: DC | PRN
Start: 2022-10-31 — End: 2022-12-19

## 2022-10-31 MED ORDER — ATOMOXETINE HCL 40 MG PO CAPS: 40.0000 mg | ORAL_CAPSULE | Freq: Two times a day (BID) | ORAL | 0 refills | Status: DC

## 2022-10-31 NOTE — Telephone Encounter (Signed)
Will complete PA tomorrow and update.

## 2022-10-31 NOTE — Progress Notes (Signed)
Virtual Visit via Video Note  I connected with Matthew [name] on 10/31/22 at  2:15 PM EDT by a video enabled telemedicine application and verified that I am speaking with the correct person using two identifiers.  Location: Patient: in parked car Provider: office   I discussed the limitations of evaluation and management by telemedicine and the availability of in person appointments. The patient expressed understanding and agreed to proceed.  History of Present Illness: Matthew [name] shares that his anxiety is really bad. Mondays are especially bad because he has to leave his family for work in [place]. He is stressed by all the things happening at with his family. Matthew [name] wants to be a good father to his children and it is hard to be away during the week. Everything was building up so that he was overwhelmed with anxiety 2 days. He felt like running away. He was able to finish work but after was crying and had some suicidal thoughts without plan or intent. He was tried of everything and couldn't see any alternative to ending his pain. He tried to talk to his wife and the staff at my office. Mobile crisis came to him after he called [facility]. They recommended he go to urgent care but Matthew [name] refused. He decided that he needed to talk to his father. The last time he talked to his dad was around 5 years ago. He met up with his father and they talked about his childhood a little. His father apologized and it seems to help Matthew [name]. He is now feeling better. Matthew [name] shares the meds are not working because he is having all these mood and anxiety symptoms. Ativan does help to calm him some so that the anxiety is not as overwhelming. He wants to get to the point where he is taking Ativan prn instead of 3x/day. He has not taken Seroquel, Effexor or Buspar in 3 weeks or longer. He has been having vertigo and nausea since stopping it. Today he is not having as severe symptoms so he is hoping that he is almost done with the withdrawal  symptoms. Matthew [name] was struggling with depression until 2 days ago. He had about 3-4 good weeks after our last visit in mid January.  Today he denies depression and SI/HI. He feels good. He is sleeping 6 hrs/night. Pt denies recent manic and hypomanic symptoms including periods of decreased need for sleep, increased energy, mood lability, impulsivity, FOI, and excessive spending in the last few weeks.  Matthew [name] denies the need for inpatient psych admission at this time.       Observations/Objective: Psychiatric Specialty Exam: ROS  There were no vitals taken for this visit.There is no height or weight on file to calculate BMI.  General Appearance: Neat and Well Groomed  Eye Contact:  Good  Speech:  Clear and Coherent and Normal Rate  Volume:  Normal  Mood:  Euthymic  Affect:  Blunt  Thought Process:  Goal Directed, Linear, and Descriptions of Associations: Intact  Orientation:  Full (Time, Place, and Person)  Thought Content:  Logical  Suicidal Thoughts:  No  Homicidal Thoughts:  No  Memory:  Immediate;   Good  Judgement:  Good  Insight:  Fair  Psychomotor Activity:  Normal  Concentration:  Concentration: Good  Recall:  Good  Fund of Knowledge:  Good  Language:  Good  Akathisia:  No  Handed:  Right  AIMS (if indicated):     Assets:  Communication Skills Desire for Improvement Financial Resources/Insurance  Housing Intimacy Resilience Social Support Talents/Skills Transportation Vocational/Educational  ADL's:  Intact  Cognition:  WNL  Sleep:        Assessment and Plan:     09/05/2022   11:18 AM 07/25/2022    9:01 AM 04/25/2022    2:30 PM 03/14/2022    9:00 AM 02/07/2022    8:40 AM  Depression screen PHQ 2/9  Decreased Interest 3 0 3 2 0  Down, Depressed, Hopeless 3 0 1 2 0  PHQ - 2 Score 6 0 4 4 0  Altered sleeping '3  3 2   '$ Tired, decreased energy '3  2 2   '$ Change in appetite 3  2    Feeling bad or failure about yourself  3   2   Trouble concentrating '3  2 2   '$ Moving  slowly or fidgety/restless 0  0 0   Suicidal thoughts 2  0 2   PHQ-9 Score '23  13 14   '$ Difficult doing work/chores Extremely dIfficult  Extremely dIfficult Very difficult     Flowsheet Row Video Visit from 09/05/2022 in [facility] Video Visit from 07/25/2022 in [facility] Video Visit from 04/25/2022 in [place] Error: Question 6 not populated No Risk Error: Q3, 4, or 5 should not be populated when Q2 is No           Status of current problems: severe episode of depression and anxiety for 2-3 days. Symptoms resolved and he is feeling better today.    Medication management with supportive therapy. Risks and benefits, side effects and alternative treatment options discussed with patient. Pt was given an opportunity to ask questions about medication, illness, and treatment. All current psychiatric medications have been reviewed and discussed with the patient and adjusted as clinically appropriate.  Pt verbalized understanding and verbal consent obtained for treatment.  Meds: d/c Effexor, Buspar, Seroquel  I would like to try an antipsychotic like Invega, Risperdal, Zyprexa or Geodon for mood control. Matthew [name] is not ready to try something new today. He feels it is more related to potential Asperger's causing his inability to articulate his thoughts and feelings causing severe anxiety.    1. Bipolar 2 disorder (HCC) - atomoxetine (STRATTERA) 40 MG capsule; Take 1 capsule (40 mg total) by mouth 2 (two) times daily with a meal.  Dispense: 360 capsule; Refill: 0 - LORazepam (ATIVAN) 1 MG tablet; Take 1 tablet (1 mg total) by mouth 3 (three) times daily as needed for anxiety.  Dispense: 90 tablet; Refill: 0  2. Poor concentration - atomoxetine (STRATTERA) 40 MG capsule; Take 1 capsule (40 mg total) by mouth 2 (two) times daily with a meal.  Dispense: 360  capsule; Refill: 0  3. Panic 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 - LORazepam (ATIVAN) 1 MG tablet; Take 1 tablet (1 mg total) by mouth 3 (three) times daily as needed for anxiety.  Dispense: 90 tablet; Refill: 0  4. GAD (generalized anxiety disorder) - LORazepam (ATIVAN) 1 MG tablet; Take 1 tablet (1 mg total) by mouth 3 (three) times daily as needed for anxiety.  Dispense: 90 tablet; Refill: 0       Therapy: brief supportive therapy provided. Discussed psychosocial stressors in detail.   Collaboration of Care: Referral or follow-up with counselor/therapist AEB referral for therapy  Patient/Guardian was advised Release of Information must be obtained prior to any record release in order to collaborate their care with an outside provider.  Patient/Guardian was advised if they have not already done so to contact the registration department to sign all necessary forms in order for [place] to release information regarding their care.   Consent: Patient/Guardian gives verbal consent for treatment and assignment of benefits for services provided during this visit. Patient/Guardian expressed understanding and agreed to proceed.     Pt's acute risk factors for suicide are recent increase in depression and anxiety symptoms 2 days ago. Pt's chronic risk factors are family hx of mental illness. Pt's protective factors are living with family, sense of responsibility to family, lack of substance abuse, denying SI in last 2 days, denies any hx of suicide attempts, good therapeutic relationship, . Pt denies SI and is at an acute low risk for suicide. Patient told to call clinic if any problems occur. Patient advised to go to ER if they should develop SI/HI, side effects, or if symptoms worsen. Pt has crisis numbers to call if needed. Pt acknowledged and agreed with plan and verbalized understanding.  Follow Up Instructions: Follow up in 4 weeks or sooner if needed    I discussed the assessment and treatment plan  with the patient. The patient was provided an opportunity to ask questions and all were answered. The patient agreed with the plan and demonstrated an understanding of the instructions.   The patient was advised to call back or seek an in-person evaluation if the symptoms worsen or if the condition fails to improve as anticipated.  I provided 35 minutes of non-face-to-face time during this encounter.   Charlcie Cradle, MD

## 2022-10-31 NOTE — Telephone Encounter (Signed)
Writer was advised to call pt after he called front desk and hung up after asking to see a provider  on 10/29/22 and when RMA called pt he hung up on her when she asked if pt was having SI. Writer then called pt back and he stated that he was on the other line with mobile crisis. Writer advised that Dr. Doyne Keel would see pt first thing (0800) on 10/31/22. Pt declined stating that he would keep his appointment on 10/31/22 @ 1415. Dr. Doyne Keel advised. This nurse spoke with pt today who said that mobile crisis did help him out and he would keep appointment today.

## 2022-11-01 ENCOUNTER — Telehealth (HOSPITAL_COMMUNITY): Payer: Self-pay | Admitting: *Deleted

## 2022-11-01 NOTE — Telephone Encounter (Signed)
Opened in error

## 2022-11-07 ENCOUNTER — Other Ambulatory Visit (HOSPITAL_COMMUNITY): Payer: Self-pay | Admitting: *Deleted

## 2022-11-20 ENCOUNTER — Telehealth (HOSPITAL_COMMUNITY): Payer: Self-pay | Admitting: *Deleted

## 2022-11-20 NOTE — Telephone Encounter (Signed)
Opened in error

## 2022-11-28 ENCOUNTER — Telehealth (HOSPITAL_COMMUNITY): Payer: Self-pay | Admitting: Psychiatry

## 2022-12-05 ENCOUNTER — Telehealth (HOSPITAL_COMMUNITY): Payer: Self-pay | Admitting: Psychiatry

## 2022-12-05 NOTE — Telephone Encounter (Signed)
Patient was not present on video platform used through mychart at our scheduled appointment time. I called the patient at our scheduled appointment time and received the message "the caller is not available at this time please try again later". I called him again a few minutes later and received the same message. I was not able to leave a VM. I was not able to speak with the patient today, as they were a no show for their scheduled appointment.

## 2022-12-19 ENCOUNTER — Other Ambulatory Visit (HOSPITAL_COMMUNITY): Payer: Self-pay

## 2022-12-19 ENCOUNTER — Other Ambulatory Visit: Payer: Self-pay

## 2022-12-19 ENCOUNTER — Telehealth (HOSPITAL_BASED_OUTPATIENT_CLINIC_OR_DEPARTMENT_OTHER): Payer: 59 | Admitting: Psychiatry

## 2022-12-19 DIAGNOSIS — F3181 Bipolar II disorder: Secondary | ICD-10-CM

## 2022-12-19 DIAGNOSIS — F410 Panic disorder [episodic paroxysmal anxiety] without agoraphobia: Secondary | ICD-10-CM | POA: Diagnosis not present

## 2022-12-19 DIAGNOSIS — F411 Generalized anxiety disorder: Secondary | ICD-10-CM

## 2022-12-19 DIAGNOSIS — R41840 Attention and concentration deficit: Secondary | ICD-10-CM

## 2022-12-19 MED ORDER — ATOMOXETINE HCL 100 MG PO CAPS
100.0000 mg | ORAL_CAPSULE | Freq: Every day | ORAL | 0 refills | Status: DC
  Filled 2022-12-19: qty 90, 90d supply, fill #0

## 2022-12-19 MED ORDER — LORAZEPAM 1 MG PO TABS
1.0000 mg | ORAL_TABLET | Freq: Three times a day (TID) | ORAL | 1 refills | Status: DC | PRN
  Filled 2022-12-19 – 2022-12-20 (×3): qty 90, 30d supply, fill #0
  Filled 2023-01-17: qty 90, 30d supply, fill #1

## 2022-12-19 NOTE — Progress Notes (Signed)
Virtual Visit via Video Note  I connected with Matthew [name] on 12/19/22 at  9:00 AM EDT by a video enabled telemedicine application and verified that I am speaking with the correct person using two identifiers.  Location: Patient: in his lawn Provider: office   I discussed the limitations of evaluation and management by telemedicine and the availability of in person appointments. The patient expressed understanding and agreed to proceed.  History of Present Illness: Matthew [name] shares that he is probably going to leave his current job. It has been difficult to drive to College and manage his home. He has been applying and interviewing at different places. Things at home are a little better since he decided to drive back and forth to his job instead of spending a few days a week away. Matthew [name] has not been able to fill his Strattera in at least 1 month or longer due to insurance. It helps to decrease his anxiety and improve his concentration. His anxiety is manageable. Matthew [name] has had a few panic attacks. At times he gets irritable and will snap at someone. This happens when he doesn't confront someone he is having a problem with. He will feel better afterwards but people around him get upset with his reaction. Matthew [name] is learning to control his response. This week he has been journaling and reading and it helps. He has not been using any other coping mechanisms. Matthew [name] was able to feel his depression coming on the last 2 times. The first time he was mad and didn't address it but his mood leveled off. The second time was about 2 weeks ago. It lasted for a couple of days and improved after he confronted his stress. He denies manic and hypomanic symptoms and episodes. Matthew [name] is sleeping about 6 hrs/night. He denies having any desire to run away. He denies SI/HI. He has been taking Ativan 3 times a day. It took about 6 weeks to recover from the dizziness    Observations/Objective: Psychiatric Specialty Exam: ROS   There were no vitals taken for this visit.There is no height or weight on file to calculate BMI.  General Appearance: Casual and Fairly Groomed  Eye Contact:  Good  Speech:  Clear and Coherent and Normal Rate  Volume:  Normal  Mood:  Euthymic  Affect:  Blunt  Thought Process:  Goal Directed, Linear, and Descriptions of Associations: Intact  Orientation:  Full (Time, Place, and Person)  Thought Content:  Logical  Suicidal Thoughts:  No  Homicidal Thoughts:  No  Memory:  Immediate;   Good  Judgement:  Good  Insight:  Good  Psychomotor Activity:  Normal  Concentration:  Concentration: Good  Recall:  Good  Fund of Knowledge:  Good  Language:  Good  Akathisia:  No  Handed:  Right  AIMS (if indicated):     Assets:  Communication Skills Desire for Improvement Financial Resources/Insurance Housing Intimacy Resilience Social Support Talents/Skills Transportation Vocational/Educational  ADL's:  Intact  Cognition:  WNL  Sleep:        Assessment and Plan:     12/19/2022    9:14 AM 09/05/2022   11:18 AM 07/25/2022    9:01 AM 04/25/2022    2:30 PM 03/14/2022    9:00 AM  Depression screen PHQ 2/9  Decreased Interest 0 3 0 3 2  Down, Depressed, Hopeless 1 3 0 1 2  PHQ - 2 Score 1 6 0 4 4  Altered sleeping  3  3 2   Tired,  decreased energy  3  2 2   Change in appetite  3  2   Feeling bad or failure about yourself   3   2  Trouble concentrating  3  2 2   Moving slowly or fidgety/restless  0  0 0  Suicidal thoughts  2  0 2  PHQ-9 Score  23  13 14   Difficult doing work/chores  Extremely dIfficult  Extremely dIfficult Very difficult    Flowsheet Row Video Visit [facility] 12/19/2022 in [facility] Video Visit from 09/05/2022 in [facility] Video Visit from 07/25/2022 in [facility]  C-SSRS RISK CATEGORY No Risk Error: Question 6 not populated No Risk          Status  of current problems: anxiety and depression symptoms come and go but are manageable since recognizing some previous patterns of behavior   Medication management with supportive therapy. Risks and benefits, side effects and alternative treatment options discussed with patient. Pt was given an opportunity to ask questions about medication, illness, and treatment. All current psychiatric medications have been reviewed and discussed with the patient and adjusted as clinically appropriate.  Pt verbalized understanding and verbal consent obtained for treatment.  Meds: increase Strattera to 100mg  po qD- insurance will not cover 40mg  BID 1. Bipolar 2 disorder (HCC)  2. Poor concentration - atomoxetine (STRATTERA) 100 MG capsule; Take 1 capsule (100 mg total) by mouth daily.  Dispense: 90 capsule; Refill: 0  3. Panic 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 - LORazepam (ATIVAN) 1 MG tablet; Take 1 tablet (1 mg total) by mouth 3 (three) times daily as needed for anxiety. *must last 30 days*  Dispense: 90 tablet; Refill: 1  4. GAD (generalized anxiety disorder) - LORazepam (ATIVAN) 1 MG tablet; Take 1 tablet (1 mg total) by mouth 3 (three) times daily as needed for anxiety. *must last 30 days*  Dispense: 90 tablet; Refill: 1      Therapy: brief supportive therapy provided. Discussed psychosocial stressors in detail.    Discussed the importance of self care and reviewed ways to engage in self care   Reviewed ways of responding to anxiety in a productive manner    Collaboration of Care: Other none  Patient/Guardian was advised Release of Information must be obtained prior to any record release in order to collaborate their care with an outside provider. Patient/Guardian was advised if they have not already done so to contact the registration department to sign all necessary forms in order for [place] to release information regarding their care.   Consent: Patient/Guardian gives verbal consent for treatment and  assignment of benefits for services provided during this visit. Patient/Guardian expressed understanding and agreed to proceed.      Follow Up Instructions: Follow up in 4-6 weeks or sooner if needed  Patient informed that I am leaving [facility] in 02/2023 and I relayed that they will be getting a new provider after that. Patient verbalized understanding and agreed with the plan.     I discussed the assessment and treatment plan with the patient. The patient was provided an opportunity to ask questions and all were answered. The patient agreed with the plan and demonstrated an understanding of the instructions.   The patient was advised to call back or seek an in-person evaluation if the symptoms worsen or if the condition fails to improve as anticipated.  I provided 24 minutes of non-face-to-face time during this encounter.   [name], MD

## 2022-12-20 ENCOUNTER — Other Ambulatory Visit (HOSPITAL_COMMUNITY): Payer: Self-pay

## 2022-12-20 ENCOUNTER — Other Ambulatory Visit: Payer: Self-pay

## 2022-12-23 ENCOUNTER — Encounter (HOSPITAL_COMMUNITY): Payer: Self-pay

## 2023-01-17 ENCOUNTER — Other Ambulatory Visit (HOSPITAL_COMMUNITY): Payer: Self-pay

## 2023-01-30 ENCOUNTER — Telehealth (HOSPITAL_BASED_OUTPATIENT_CLINIC_OR_DEPARTMENT_OTHER): Payer: 59 | Admitting: Psychiatry

## 2023-01-30 ENCOUNTER — Other Ambulatory Visit (HOSPITAL_COMMUNITY): Payer: Self-pay

## 2023-01-30 DIAGNOSIS — F411 Generalized anxiety disorder: Secondary | ICD-10-CM

## 2023-01-30 DIAGNOSIS — R41840 Attention and concentration deficit: Secondary | ICD-10-CM | POA: Diagnosis not present

## 2023-01-30 DIAGNOSIS — F410 Panic disorder [episodic paroxysmal anxiety] without agoraphobia: Secondary | ICD-10-CM

## 2023-01-30 DIAGNOSIS — F5105 Insomnia due to other mental disorder: Secondary | ICD-10-CM

## 2023-01-30 DIAGNOSIS — F3181 Bipolar II disorder: Secondary | ICD-10-CM

## 2023-01-30 DIAGNOSIS — F99 Mental disorder, not otherwise specified: Secondary | ICD-10-CM

## 2023-01-30 MED ORDER — LORAZEPAM 1 MG PO TABS
1.0000 mg | ORAL_TABLET | Freq: Three times a day (TID) | ORAL | 2 refills | Status: DC | PRN
  Filled 2023-01-30 – 2023-02-21 (×2): qty 90, 30d supply, fill #0
  Filled 2023-03-21 (×2): qty 90, 30d supply, fill #1
  Filled 2023-04-18: qty 90, 30d supply, fill #2

## 2023-01-30 MED ORDER — ATOMOXETINE HCL 100 MG PO CAPS
100.0000 mg | ORAL_CAPSULE | Freq: Every day | ORAL | 0 refills | Status: DC
  Filled 2023-01-30: qty 90, 90d supply, fill #0

## 2023-01-30 NOTE — Progress Notes (Signed)
Virtual Visit via Video Note  I connected with Matthew [name] on 01/30/23 at  9:00 AM EDT by a video enabled telemedicine application and verified that I am speaking with the correct person using two identifiers.  Location: Patient: in parked car Provider: office   I discussed the limitations of evaluation and management by telemedicine and the availability of in person appointments. The patient expressed understanding and agreed to proceed.  History of Present Illness: Matthew [name] shares he is doing alright. His anxiety is better now but it was building up for about 4 weeks. It was due to stressors at work. He was having some panic like symptoms this past weekend. It lead up to depression that worsened to the point of feeling sad, unmotivated, loss of energy and passive thoughts of death this past weekend. He let out all of his feelings with his wife in a "man tantrum". It was helpful and he is feeling better. Today he states the depression is gone. Matthew [name] denies SI/HI. His anxiety is decreased. This past week he did not need his Ativan at all but other days he was taking it twice a day. It remains effective and helps him calm down. He is trying to cut back on use.  Pt denies recent manic and hypomanic symptoms including periods of decreased need for sleep, increased energy, mood lability, impulsivity, FOI, and excessive spending. He is sleeping well and is getting about 6 hrs of unbroken sleep most nights. His focus is good and he is doing well at work. He has been applying to different jobs and is hopeful that something will work out. Matthew [name] shares he does not need Valtrex as long as his anxiety is well controlled.      Observations/Objective: Psychiatric Specialty Exam: ROS  There were no vitals taken for this visit.There is no height or weight on file to calculate BMI.  General Appearance: Neat and Well Groomed  Eye Contact:  Good  Speech:  Clear and Coherent and Normal Rate  Volume:  Normal   Mood:  Euthymic  Affect:  Blunt  Thought Process:  Goal Directed, Linear, and Descriptions of Associations: Intact  Orientation:  Full (Time, Place, and Person)  Thought Content:  Logical  Suicidal Thoughts:  No  Homicidal Thoughts:  No  Memory:  Immediate;   Good  Judgement:  Good  Insight:  Good  Psychomotor Activity:  Normal  Concentration:  Concentration: Good  Recall:  Good  Fund of Knowledge:  Good  Language:  Good  Akathisia:  No  Handed:  Right  AIMS (if indicated):     Assets:  Communication Skills Desire for Improvement Financial Resources/Insurance Housing Intimacy Leisure Time Resilience Social Support Talents/Skills Transportation Vocational/Educational  ADL's:  Intact  Cognition:  WNL  Sleep:        Assessment and Plan:     12/19/2022    9:14 AM 09/05/2022   11:18 AM 07/25/2022    9:01 AM 04/25/2022    2:30 PM 03/14/2022    9:00 AM  Depression screen PHQ 2/9  Decreased Interest 0 3 0 3 2  Down, Depressed, Hopeless 1 3 0 1 2  PHQ - 2 Score 1 6 0 4 4  Altered sleeping  3  3 2   Tired, decreased energy  3  2 2   Change in appetite  3  2   Feeling bad or failure about yourself   3   2  Trouble concentrating  3  2 2   Moving  slowly or fidgety/restless  0  0 0  Suicidal thoughts  2  0 2  PHQ-9 Score  23  13 14   Difficult doing work/chores  Extremely dIfficult  Extremely dIfficult Very difficult    Flowsheet Row Video Visit from 12/19/2022 in [facility] Video Visit from 09/05/2022 in [facility] Video Visit from 07/25/2022 in [facility]  C-SSRS RISK CATEGORY No Risk Error: Question 6 not populated No Risk          Status of current problems: ongoing struggles with anxiety and depression. Sleep and focus are good.    Medication management with supportive therapy. Risks and benefits, side effects and alternative treatment options  discussed with patient. Pt was given an opportunity to ask questions about medication, illness, and treatment. All current psychiatric medications have been reviewed and discussed with the patient and adjusted as clinically appropriate.  Pt verbalized understanding and verbal consent obtained for treatment.  Meds:  1. GAD (generalized anxiety disorder) - LORazepam (ATIVAN) 1 MG tablet; Take 1 tablet (1 mg total) by mouth 3 (three) times daily as needed for anxiety. *must last 30 days*  Dispense: 90 tablet; Refill: 2  2. Bipolar 2 disorder (HCC)  3. Poor concentration - atomoxetine (STRATTERA) 100 MG capsule; Take 1 capsule (100 mg total) by mouth daily.  Dispense: 90 capsule; Refill: 0  4. Panic 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 - LORazepam (ATIVAN) 1 MG tablet; Take 1 tablet (1 mg total) by mouth 3 (three) times daily as needed for anxiety. 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 Discussed psychosocial stressors in detail.     Collaboration of Care: Other none  Patient/Guardian was advised Release of Information must be obtained prior to any record release in order to collaborate their care with an outside provider. Patient/Guardian was advised if they have not already done so to contact the registration department to sign all necessary forms in order for [place] to release information regarding their care.   Consent: Patient/Guardian gives verbal consent for treatment and assignment of benefits for services provided during this visit. Patient/Guardian expressed understanding and agreed to proceed.   I reviewed the information below on 01/30/23 and have updated it and agree.  Pt's acute risk factors for suicide are recent increase in depression and anxiety symptoms. Pt's chronic risk factors are family hx of mental illness. Pt's protective factors are living with family,  sense of responsibility to family, lack of substance abuse, denying SI in last 5 days, denies any hx of suicide attempts, good therapeutic relationship. Pt denies SI and is at an acute low risk for suicide. Patient told to call clinic if any problems occur. Patient advised to go to ER if they should develop SI/HI, side effects, or if symptoms worsen. Pt has crisis numbers to call if needed. Pt acknowledged and agreed with plan and verbalized understanding.     Follow Up Instructions: Follow up in 3 months or sooner if needed with a new psychiatrist  Patient informed that I am leaving [facility] in 02/2023 and I relayed that they will be getting a new provider after that. Patient verbalized understanding and agreed with the plan.     I discussed the assessment and treatment plan with the patient. The patient was provided an opportunity to ask questions and all were  answered. The patient agreed with the plan and demonstrated an understanding of the instructions.   The patient was advised to call back or seek an in-person evaluation if the symptoms worsen or if the condition fails to improve as anticipated.  I provided 16 minutes of non-face-to-face time during this encounter.   [name], MD

## 2023-02-04 ENCOUNTER — Telehealth (HOSPITAL_COMMUNITY): Payer: Self-pay | Admitting: Psychiatry

## 2023-02-21 ENCOUNTER — Other Ambulatory Visit (HOSPITAL_COMMUNITY): Payer: Self-pay

## 2023-03-20 ENCOUNTER — Other Ambulatory Visit (HOSPITAL_COMMUNITY): Payer: Self-pay

## 2023-03-21 ENCOUNTER — Other Ambulatory Visit (HOSPITAL_COMMUNITY): Payer: Self-pay

## 2023-04-18 ENCOUNTER — Other Ambulatory Visit (HOSPITAL_COMMUNITY): Payer: Self-pay

## 2023-04-20 NOTE — Progress Notes (Signed)
Psychiatric Initial Adult Assessment   Patient Identification: Matthew [name] MRN:  [id number] Date of Evaluation:  04/29/2023 Referral Source: Sheliah Hatch, MD  Chief Complaint:   Chief Complaint  Patient presents with   Establish Care   Visit Diagnosis:    ICD-10-CM   1. Bipolar 2 disorder (HCC)  F31.81 TSH    2. GAD (generalized anxiety disorder)  F41.1 TSH    LORazepam (ATIVAN) 1 MG tablet    3. High risk medication use  Z79.899 Drug Screen, Urine    4. Panic 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  F41.0 LORazepam (ATIVAN) 1 MG tablet      History of Present Illness:   Matthew [name] is a 52 y.o. year old male with a history of bipolar II disorder, anxiety, panic 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, insomnia. The patient was transferred from Dr. Michae Kava. I conducted an extensive chart review. To ensure diagnostic accuracy and appropriate treatment, I performed a comprehensive evaluation as detailed below.  According to the chart review, the patient has been following the medication regimen prescribed by Dr. Michae Kava for the diagnosis of bipolar II disorder, panic disorder, insomnia. Lorazepam 1 mg . 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  He used to commute to [place]. He did not like the culture there, and feels more comfortable at the current work place.  He was very stressed as he was away from his family, and did not have much time to do face time.  He did not know what was going on with his family.  He also experienced physical symptoms of tinnitus, feeling chill, worsening in anxiety when he tried to taper off venlafaxine.  He does not experience any symptoms anymore, and he believes his mood has been better since being off venlafaxine.  He reports periods of worsening in his mood after he is back home, and not going to work for two weeks. Although he and his wife will say things to each other, he believes the  relationship has been better since he works closer to home/more time with his family.  Depression- The patient has mood symptoms as in PHQ-9/GAD-7.  He believes antidepressants made him getting more depressed.   SI-he reports a few episodes of intense SI, although she denies any attempt in the past.  He called [facility] in the past when he had worsening in SI.  She states that if SI is usually tired with anxiety.  It starts with feeling trapped, and not succeed.  Although she reports occasional passive SI, she adamantly denies any plan or intent.  Although he has guns at home, they are locked.  He denies concern about having access to it. His wife is aware of his SI, and he reports support from his wife.   Anxiety-he struggles with anxiety, although Buspar is helping to some extent.  He was feeling very anxious last Sunday as he was concerned about financial strain.  He also states that he thinks he has Asperger.  He has difficulty in communication, reading people, and responding correctly.  He feels anxious when he is unable to communicate as he wishes.  He can lash out, cursed out or yelling. He is "not mean" for the past month.  He denies HI.  He denies aggression, although he states that he might do things as a self defense.    Mania-he denies decreased need for sleep or euphoria.  Although  he reports impulsive shopping, it is lower than his budget.   ECT-He did not like the experience in relation to anesthesia.  He was not aware of himself, and agrees that he felt out of control.  He does not want to pursue this treatment again unless it is needed, as he feels his condition is not as severe as it was last year.  Household: wife, four children Marital status: married, 21 years of marriage. Divorced before Number of children: 79, age 52- 52's. 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 [employer]:    Medication- Buspar 2.5 mg - 5 mg at night (sedating, leg starts to feel weird after one hour), lorazepam 1 mg (currently takes twice a day) three times a day as needed for anxiety,   Substance use  Tobacco Alcohol Other substances/  Current  2-3 shots of liquor every day Marijuana, CBD once a month for relaxing  Past  2002 drink excessively per chart review "Cocaine use 2002 LSD 23 years ago " per chart review  Past Treatment       Wt Readings from Last 3 Encounters:  04/29/23 237 lb (107.5 kg)  04/19/22 210 lb (95.3 kg)  01/30/22 210 lb (95.3 kg)     Associated Signs/Symptoms: Depression Symptoms:  depressed mood, anxiety, (Hypo) Manic Symptoms:   denies decreased need for sleep, euphoria Anxiety Symptoms:   moderate anxiety Psychotic Symptoms:   denies AH, VH, paranoia PTSD Symptoms: Negative  Past Psychiatric History:  Outpatient:  Psychiatry admission: denies Previous suicide attempt: denies Past trials of medication: citalopram, Paxil, lexapro, venlafaxine (zombie), mirtazapine, bupropion, buspirone, Abilify, latuda,Depakote, lithium, carbamazepine, quetiapine (sedative), Atomoxetine (depressed, SI) History of violence: denies History of head injury:  Legal: denies  Previous Psychotropic Medications: Yes   Substance Abuse History in the last 12 months:  Yes.    Consequences of Substance Abuse: Mood symptoms as above  Past Medical History:  Past Medical History:  Diagnosis Date   Acute kidney failure (HCC) 01/2012   Anxiety    Bipolar disorder (HCC)    hypomania   COPD (chronic obstructive pulmonary disease) (HCC)    Depression    Low back pain     Past Surgical History:  Procedure Laterality Date   NO PAST SURGERIES      Family Psychiatric History: as below  Family History:  Family History  Problem Relation Age of Onset   Anxiety disorder Mother    Alcohol abuse Father    Drug abuse Father    Bipolar disorder  Sister    Alcohol abuse Brother    Drug abuse Brother    Bipolar disorder Paternal Aunt    Schizophrenia Paternal Aunt    Hypertension Maternal Grandfather    Depression Maternal Grandmother    Cancer Maternal Grandmother        lung   Hypertension Paternal Grandfather    Cancer Paternal Grandmother        lung   Diabetes Neg Hx    Heart disease Neg Hx    Hyperlipidemia Neg Hx    Stroke Neg Hx    Suicidality Neg Hx     Social History:   Social History   Socioeconomic History   Marital status: Married    Spouse name: Not on file   Number of children: Not on file   Years of education: Not on file   Highest  education level: Not on file  Occupational History   Not on file  Tobacco Use   Smoking status: Every Day    Current packs/day: 2.00    Average packs/day: 2.0 packs/day for 20.0 years (40.0 ttl pk-yrs)    Types: Cigarettes   Smokeless tobacco: Never  Vaping Use   Vaping status: Never Used  Substance and Sexual Activity   Alcohol use: Yes    Alcohol/week: 2.0 standard drinks of alcohol    Types: 2 Standard drinks or equivalent per week   Drug use: Yes    Types: Marijuana    Comment: daily- quit 1 month ago   Sexual activity: Yes    Partners: Female    Birth control/protection: None  Other Topics Concern   Not on file  Social History Narrative   Not on file   Social Determinants of Health   Financial Resource Strain: Not on file  Food Insecurity: Not on file  Transportation Needs: Not on file  Physical Activity: Not on file  Stress: Not on file  Social Connections: Not on file    Additional Social History: as above  Allergies:   Allergies  Allergen Reactions   Ciprofloxacin     Acute kidney failure   Codeine Nausea And Vomiting    Metabolic Disorder Labs: Lab Results  Component Value Date   HGBA1C 5.3 04/07/2020   MPG 105 04/07/2020   MPG 103 02/03/2014   Lab Results  Component Value Date   PROLACTIN 7.4 03/09/2019   PROLACTIN 15.7 (H)  03/12/2018   Lab Results  Component Value Date   CHOL 161 03/09/2019   TRIG 174.0 (H) 03/09/2019   HDL 30.60 (L) 03/09/2019   CHOLHDL 5 03/09/2019   VLDL 34.8 03/09/2019   LDLCALC 96 03/09/2019   LDLCALC 57 03/12/2018   Lab Results  Component Value Date   TSH 0.86 04/07/2020    Therapeutic Level Labs: Lab Results  Component Value Date   LITHIUM 0.6 03/12/2018   No results found for: "CBMZ" Lab Results  Component Value Date   VALPROATE 39.1 (L) 03/09/2019    Current Medications: Current Outpatient Medications  Medication Sig Dispense Refill   baclofen (LIORESAL) 10 MG tablet Take 1 tablet (10 mg total) by mouth 3 (three) times daily as needed for muscle spasms. 45 tablet 3   busPIRone (BUSPAR) 5 MG tablet Take 1/2 tablet (2.5 mg total) by mouth daily AND 1 tablet (5 mg total) at bedtime. 45 tablet 1   cyclobenzaprine (FLEXERIL) 10 MG tablet Take 1 tablet (10 mg total) by mouth 3 (three) times daily as needed. 30 tablet 0   influenza vac split quadrivalent PF (FLUARIX) 0.5 ML injection Inject 0.5 mLs into the muscle. 0.5 mL 0   tadalafil (CIALIS) 10 MG tablet Take 1 tablet (10 mg total) by mouth daily as needed for erectile dysfunction. 10 tablet 1   valACYclovir (VALTREX) 1000 MG tablet Take 2 tablets (2,000 mg total) by mouth 2 (two) times daily. 4 tablet 6   atomoxetine (STRATTERA) 100 MG capsule Take 1 capsule (100 mg total) by mouth daily. (Patient not taking: Reported on 04/29/2023) 90 capsule 0   [START ON 05/18/2023] LORazepam (ATIVAN) 1 MG tablet May take 1 tablet (1 mg total) by mouth 2 (two) times daily as needed for anxiety. May also take 1 tablet (1 mg total) every other day as needed for anxiety. 75 tablet 1   No current facility-administered medications for this visit.    Musculoskeletal: Strength &  Muscle Tone: within normal limits Gait & Station: normal Patient leans: N/A  Psychiatric Specialty Exam: Review of Systems  Psychiatric/Behavioral:  Positive  for dysphoric mood. Negative for agitation, behavioral problems, confusion, decreased concentration, hallucinations, self-injury, sleep disturbance and suicidal ideas. The patient is nervous/anxious. The patient is not hyperactive.   All other systems reviewed and are negative.   Blood pressure (!) 150/97, pulse 91, temperature (!) 97 F (36.1 C), temperature source Skin, height 6\' 5"  (1.956 m), weight 237 lb (107.5 kg).Body mass index is 28.1 kg/m.  General Appearance: Well Groomed  Eye Contact:  Good  Speech:  Clear and Coherent  Volume:  Normal  Mood:  Anxious  Affect:  Appropriate, Congruent, and calm  Thought Process:  Coherent  Orientation:  Full (Time, Place, and Person)  Thought Content:  Logical  Suicidal Thoughts:  No  Homicidal Thoughts:  No  Memory:  Immediate;   Good  Judgement:  Good  Insight:  Good  Psychomotor Activity:  Normal  Concentration:  Concentration: Good and Attention Span: Good  Recall:  Good  Fund of Knowledge:Good  Language: Good  Akathisia:  No  Handed:  Right  AIMS (if indicated):  not done  Assets:  Communication Skills Desire for Improvement  ADL's:  Intact  Cognition: WNL  Sleep:  Fair   Screenings: ECT-MADRS    Flowsheet Row ECT Treatment from 04/19/2022 in [facility] ECT Treatment from 07/21/2020 in [facility] ECT Treatment from 05/05/2019 in [facility] ECT Treatment from 03/17/2019 in [facility] ECT Treatment from 09/07/2018 in [facility]  MADRS Total Score 39 39 16 34 22      GAD-7    Flowsheet Row Office Visit from 04/29/2023 in [facility]  Total GAD-7 Score 12      Mini-Mental    Flowsheet Row ECT Treatment from 07/21/2020 in [facility] ECT Treatment from 05/05/2019 in [facility] ECT Treatment from 03/17/2019 in [facility] ECT Treatment from 09/07/2018 in [facility] ECT Treatment from 08/31/2018 in [facility]  Total Score (max 30 points ) 30 30 30 30 30       PHQ2-9    Flowsheet Row Office Visit from 04/29/2023 in [facility] Video Visit from [facility]/2022 [facility] Video Visit from 09[facility]05/2022 [facility] Video Visit from 07/25/2022 in [facility] Video Visit from 04/25/2022 in [facility]  PHQ-2 Total Score 1 1 6  0 4  PHQ-9 Total Score 5 -- 23 -- 13      Flowsheet Row Office Visit from 04/29/2023 in [facility] Video Visit from 12/19/2022 in [facility] Video Visit from 09/05/2022 in [facility]  C-SSRS RISK CATEGORY Error: Q3, 4, or 5 should not be populated when Q2 is No No Risk Error: Question 6 not populated       Assessment and Plan:  BERNAL KOMM [name] is a 52 y.o. year old male with a history of bipolar II disorder, anxiety, panic 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, insomnia. The patient was transferred from Dr. Michae Kava   1. Bipolar 2 disorder (HCC) 2. GAD (generalized anxiety disorder) 4.  Panic 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 Acute stressors include:  starting a new job at [employer], spending more time with his family at home Other stressors include: divorce in the past, loss of his parents, his son with Asperger's    History:Tx from [place]. ECT in 2023 - he did not like the experience in relation to anesthesia. Per Dr. Arline Asp note"reports hx of hypomanic like episodes- sleeping only 4 hr/night, increased energy, elevated mood, grandiose  thoughts, starting a lot of projects but not finishing anything, sexually inappropriate behavior. During this time he still goes to work and completes his responsibilities)" "reports when angry he will destroy property and break things."  He reports depressive symptoms with SI, which she reports has been improving since tapering off venlafaxine a few months ago.  He denies experiencing any hypomanic symptoms during today's evaluation and attributes his mood primarily to anxiety. However, the chart review indicates that he has had episodes that could be classified as hypomanic, as described above.  Despite this, the previous provider did not maintain antipsychotics or mood stabilizer for some reason, although he had trials of these medications and reportedly experienced side effects.  Will closely monitor and continue to assess this.  He might benefit from Viibryd in the future given he reports good benefit from Buspar except mild drowsiness. He was given a handout with information about this medication. Will continue current dose of buspirone at this time to target anxiety while monitoring any manic symptoms.  Will continue lorazepam as needed for anxiety.   # benzodiazepine use He has been on lorazepam for many years, and has been able to taper down the frequency.  Will continue to work towards slowly tapering off this medication given his history of alcohol, and family history of alcohol and substance use.   3. High risk medication use Will obtain UDS.   # Alcohol use, marijuana use He is motivated to cut down alcohol use, although he is not interested in pharmacological treatment.  He has a family history of alcohol and drug use.  Will continue the.   # marijuana use He is at pre contemplative stage for marijuana use. He states that he was using substances in the context of struggling with the loss of his parents, divorce in the past.  However, he thinks he can quit if he wants to. Provided psycho  education regarding its impact on mental health    Plan Continue buspirone 2.5 mg daily, 5 mg at night  Continue lorazepam 1 mg twice  a day as needed for anxiety, 75 tablets (previously prescribed 1 mg TID) Obtain lab (TSH, urine drug screen) Next appointment: 11/4 at 8 30, IP  The patient demonstrates the following risk factors for suicide: Chronic risk factors for suicide include: psychiatric disorder of bipolar disorder . Acute risk factors for suicide include: N/A. Protective factors for this patient include: positive social support, responsibility to others (children, family), coping skills, and hope for the future. Considering these factors, the overall suicide risk at this point appears to be moderate. Patient is appropriate for outpatient follow up.  He has guns, which are locked.  Emergency resources which includes 911, ED, suicide crisis line (988) are discussed.    Collaboration of Care: Other reviewed notes in Epic  Patient/Guardian was advised Release of Information must be obtained prior to any record release in order to collaborate their care with an outside provider. Patient/Guardian was advised if they have not already done so to contact the registration department to sign  all necessary forms in order for [place] to release information regarding their care.   Consent: Patient/Guardian gives verbal consent for treatment and assignment of benefits for services provided during this visit. Patient/Guardian expressed understanding and agreed to proceed.   The duration of the time spent on the following activities on the date of the encounter was 70 minutes.   Preparing to see the patient (e.g., review of test, records)  Obtaining and/or reviewing separately obtained history  Performing a medically necessary exam and/or evaluation  Counseling and educating the patient/family/caregiver  Ordering medications, tests, or procedures  Referring and communicating with other healthcare  professionals (when not reported separately)  Documenting clinical information in the electronic or paper health record  Independently interpreting results of tests/labs and communication of results to the family or caregiver  Neysa Hotter, MD 9/10/20245:13 PM

## 2023-04-29 ENCOUNTER — Other Ambulatory Visit (HOSPITAL_COMMUNITY): Payer: Self-pay

## 2023-04-29 ENCOUNTER — Ambulatory Visit (INDEPENDENT_AMBULATORY_CARE_PROVIDER_SITE_OTHER): Payer: BC Managed Care – PPO | Admitting: Psychiatry

## 2023-04-29 ENCOUNTER — Encounter: Payer: Self-pay | Admitting: Psychiatry

## 2023-04-29 VITALS — BP 150/97 | HR 91 | Temp 97.0°F | Ht 77.0 in | Wt 237.0 lb

## 2023-04-29 DIAGNOSIS — F411 Generalized anxiety disorder: Secondary | ICD-10-CM | POA: Diagnosis not present

## 2023-04-29 DIAGNOSIS — Z79899 Other long term (current) drug therapy: Secondary | ICD-10-CM

## 2023-04-29 DIAGNOSIS — F410 Panic disorder [episodic paroxysmal anxiety] without agoraphobia: Secondary | ICD-10-CM | POA: Diagnosis not present

## 2023-04-29 DIAGNOSIS — F3181 Bipolar II disorder: Secondary | ICD-10-CM

## 2023-04-29 MED ORDER — BUSPIRONE HCL 5 MG PO TABS
ORAL_TABLET | ORAL | 1 refills | Status: AC
  Filled 2023-04-29: qty 45, 30d supply, fill #0

## 2023-04-29 MED ORDER — LORAZEPAM 1 MG PO TABS
ORAL_TABLET | ORAL | 1 refills | Status: DC
  Filled 2023-05-26: qty 75, 25d supply, fill #0
  Filled 2023-06-27: qty 75, 30d supply, fill #1

## 2023-04-29 NOTE — Patient Instructions (Signed)
Continue buspirone 2.5 mg daily, 5 mg at night  Continue lorazepam 1 mg twice  a day as needed for anxiety, 75 tablets per month Obtain lab (TSH, urine drug screen) Next appointment: 11/4 at 8 30

## 2023-05-02 ENCOUNTER — Other Ambulatory Visit (HOSPITAL_COMMUNITY): Payer: Self-pay

## 2023-05-02 MED ORDER — AMOXICILLIN 500 MG PO CAPS
500.0000 mg | ORAL_CAPSULE | Freq: Three times a day (TID) | ORAL | 0 refills | Status: DC
  Filled 2023-05-02: qty 21, 7d supply, fill #0

## 2023-05-10 ENCOUNTER — Other Ambulatory Visit (HOSPITAL_COMMUNITY): Payer: Self-pay

## 2023-05-19 ENCOUNTER — Other Ambulatory Visit (HOSPITAL_COMMUNITY): Payer: Self-pay

## 2023-05-20 DIAGNOSIS — F3181 Bipolar II disorder: Secondary | ICD-10-CM | POA: Diagnosis not present

## 2023-05-20 DIAGNOSIS — F411 Generalized anxiety disorder: Secondary | ICD-10-CM | POA: Diagnosis not present

## 2023-05-21 ENCOUNTER — Encounter: Payer: Self-pay | Admitting: Psychiatry

## 2023-05-21 LAB — TSH: TSH: 1.2 u[IU]/mL (ref 0.450–4.500)

## 2023-05-26 ENCOUNTER — Other Ambulatory Visit (HOSPITAL_COMMUNITY): Payer: Self-pay

## 2023-06-16 NOTE — Progress Notes (Signed)
BH MD/PA/NP OP Progress Note  06/23/2023 5:27 PM Matthew [name]  MRN:  [id number]  Chief Complaint:  Chief Complaint  Patient presents with   Follow-up   HPI:  This is a follow-up appointment for bipolar 2 disorder, anxiety, alcohol use and marijuana use.  He states that he is doing fine.  When he was asked about high scores on the screening sheet, he paused and becomes tearful.  He states that he does not belong at work. He does not feel good enough. He does not feel appreciated either at work or at home.  Although being a father is his priority, "they" told him that he is making the situation worse.  He talks about an example of him talking with his daughter, who did not want to go to school due to anxiety.  He was compassionate, and listened to her. He eventually advised her to go to school, referring to himself, who goes to work, while being stressed.  He does not regret this advice.  He was later informed by his wife that he made the situation worse.  After he shared another episode of his wife told him the similar things,  he agrees that there is a conflict between his wife.  He states that being a father is his priority due to the experience with his father.  Although he met him, and told him that he forgives his father, he wants to go there and hurt him.  He adamantly denies any plan or intent to act on his thoughts. He does not want to hurt anybody.  He feels "what's the point", and reports SI. Although he would not care if anything were to happen to him, he denies any SI intent.  When he was advised to consider inpatient treatment, he took time to think about it, and asked several questions, including the length of stay. I spent significant time, explaining the rationale of recommendation of inpatient treatment, which includes being away from stressors while undergoing medication management. He does not think it is easy to take days off from work.  He also like to discuss with his wife  as it can affect his family.  He expressed understanding to contact emergency resources if any worsening. .The patient has mood symptoms as in PHQ-9/GAD-7.  He feels anxious, and takes clonazepam every day.  Hallucinations-he reports VH, feeling that some people were there.   Medication-he has been taking quetiapine 50 mg at night, a few days per week when he struggles with his mood (experiences drowsiness)   Substance use   Tobacco Alcohol Other substances/  Current  smokes 2-3 shots of liquor every day after work Marijuana every day to feel relax  Past   "2002 drink excessively" per chart review "Cocaine use 2002 LSD 23 years ago " per chart review  Past Treatment             Household: wife, four children Marital status: married, 21 years of marriage. Divorced before Number of children: 63, age 59- 39's. 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 High risk medication use  Z79.899 Urine Drug Panel 7  EKG 12-Lead    2. Bipolar 2 disorder (HCC)  F31.81     3. GAD (generalized anxiety disorder)  F41.1     4. Panic 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  F41.0     5. Marijuana use  F12.90       Past Psychiatric History: Please see initial evaluation for full details. I have reviewed the history. No updates at this time.     Past Medical History:  Past Medical History:  Diagnosis Date   Acute kidney failure (HCC) 01/2012   Anxiety    Bipolar disorder (HCC)    hypomania   COPD (chronic obstructive pulmonary disease) (HCC)    Depression    Low back pain     Past Surgical History:  Procedure Laterality Date   NO PAST SURGERIES      Family Psychiatric History: Please see initial  evaluation for full details. I have reviewed the history. No updates at this time.    Family History:  Family History  Problem Relation Age of Onset   Anxiety disorder Mother    Alcohol abuse Father    Drug abuse Father    Bipolar disorder Sister    Alcohol abuse Brother    Drug abuse Brother    Bipolar disorder Paternal Aunt    Schizophrenia Paternal Aunt    Hypertension Maternal Grandfather    Depression Maternal Grandmother    Cancer Maternal Grandmother        lung   Hypertension Paternal Grandfather    Cancer Paternal Grandmother        lung   Diabetes Neg Hx    Heart disease Neg Hx    Hyperlipidemia Neg Hx    Stroke Neg Hx    Suicidality Neg Hx     Social History:  Social History   Socioeconomic History   Marital status: Married    Spouse name: Not on file   Number of children: Not on file   Years of education: Not on file   Highest education level: Not on file  Occupational History   Not on file  Tobacco Use   Smoking status: Every Day    Current packs/day: 2.00    Average packs/day: 2.0 packs/day for 20.0 years (40.0 ttl pk-yrs)    Types: Cigarettes   Smokeless tobacco: Never  Vaping Use   Vaping status: Never Used  Substance and Sexual Activity   Alcohol use: Yes    Alcohol/week: 2.0 standard drinks of alcohol    Types: 2 Standard drinks or equivalent per week   Drug use: Yes    Types: Marijuana    Comment: daily- quit 1 month ago   Sexual activity: Yes    Partners: Female    Birth control/protection: None  Other Topics Concern   Not on file  Social History Narrative   Not on file   Social Determinants of Health   Financial Resource Strain: Not on file  Food Insecurity: Not on file  Transportation Needs: Not on file  Physical Activity: Not on file  Stress: Not on file  Social Connections: Not on file    Allergies:  Allergies  Allergen Reactions   Ciprofloxacin     Acute kidney failure   Codeine Nausea And Vomiting    Metabolic  Disorder Labs: Lab Results  Component Value Date   HGBA1C 5.3 04/07/2020   MPG 105 04/07/2020   MPG 103 02/03/2014   Lab Results  Component Value Date   PROLACTIN 7.4 03/09/2019   PROLACTIN  15.7 (H) 03/12/2018   Lab Results  Component Value Date   CHOL 161 03/09/2019   TRIG 174.0 (H) 03/09/2019   HDL 30.60 (L) 03/09/2019   CHOLHDL 5 03/09/2019   VLDL 34.8 03/09/2019   LDLCALC 96 03/09/2019   LDLCALC 57 03/12/2018   Lab Results  Component Value Date   TSH 1.200 05/20/2023   TSH 0.86 04/07/2020    Therapeutic Level Labs: Lab Results  Component Value Date   LITHIUM 0.6 03/12/2018   LITHIUM 0.9 04/24/2017   Lab Results  Component Value Date   VALPROATE 39.1 (L) 03/09/2019   VALPROATE 34 (L) 03/12/2018   No results found for: "CBMZ"  Current Medications: Current Outpatient Medications  Medication Sig Dispense Refill   amoxicillin (AMOXIL) 500 MG capsule Take 1 capsule (500 mg total) by mouth 3 (three) times daily until gone 21 capsule 0   atomoxetine (STRATTERA) 100 MG capsule Take 1 capsule (100 mg total) by mouth daily. 90 capsule 0   baclofen (LIORESAL) 10 MG tablet Take 1 tablet (10 mg total) by mouth 3 (three) times daily as needed for muscle spasms. 45 tablet 3   busPIRone (BUSPAR) 15 MG tablet Take 15 mg by mouth 2 (two) times daily. 7.5 mg in AM, 15 mg at night     busPIRone (BUSPAR) 5 MG tablet Take 1/2 tablet (2.5 mg total) by mouth daily AND 1 tablet (5 mg total) at bedtime. 45 tablet 1   cyclobenzaprine (FLEXERIL) 10 MG tablet Take 1 tablet (10 mg total) by mouth 3 (three) times daily as needed. 30 tablet 0   influenza vac split quadrivalent PF (FLUARIX) 0.5 ML injection Inject 0.5 mLs into the muscle. 0.5 mL 0   LORazepam (ATIVAN) 1 MG tablet May take 1 tablet (1 mg total) by mouth 2 (two) times daily as needed for anxiety. May also take 1 tablet (1 mg total) every other day as needed for anxiety. 75 tablet 1   lumateperone tosylate (CAPLYTA) 10.5 MG  capsule Take 1 capsule (10.5 mg total) by mouth daily. 30 capsule 0   tadalafil (CIALIS) 10 MG tablet Take 1 tablet (10 mg total) by mouth daily as needed for erectile dysfunction. 10 tablet 1   valACYclovir (VALTREX) 1000 MG tablet Take 2 tablets (2,000 mg total) by mouth 2 (two) times daily. 4 tablet 6   No current facility-administered medications for this visit.     Musculoskeletal: Strength & Muscle Tone: within normal limits Gait & Station: normal Patient leans: N/A  Psychiatric Specialty Exam: Review of Systems  Psychiatric/Behavioral:  Positive for decreased concentration, dysphoric mood, sleep disturbance and suicidal ideas. Negative for agitation, behavioral problems, confusion, hallucinations and self-injury. The patient is nervous/anxious. The patient is not hyperactive.   All other systems reviewed and are negative.   Blood pressure (!) 145/91, pulse 90, temperature (!) 95 F (35 C), temperature source Skin, height 6\' 5"  (1.956 m), weight 248 lb 12.8 oz (112.9 kg).Body mass index is 29.5 kg/m.  General Appearance: Well Groomed  Eye Contact:  Minimal  Speech:   pauses many times, low volume. Normal tone  Volume:  Normal  Mood:  Depressed  Affect:  Appropriate, Congruent, and Restricted  Thought Process:  Coherent  Orientation:  Full (Time, Place, and Person)  Thought Content: Logical   Suicidal Thoughts:  Yes.  without intent/plan  Homicidal Thoughts:  Yes.  without intent/plan  Memory:  Immediate;   Good  Judgement:  Good  Insight:  Fair  Psychomotor Activity:  Normal  Concentration:  Concentration: Good and Attention Span: Good  Recall:  Good  Fund of Knowledge: Good  Language: Good  Akathisia:  No  Handed:  Right  AIMS (if indicated): not done  Assets:  Communication Skills Desire for Improvement  ADL's:  Intact  Cognition: WNL  Sleep:  Poor   Screenings: ECT-MADRS    Flowsheet Row ECT Treatment from 04/19/2022 in [facility] ECT Treatment from 07/21/2020 in [facility] ECT Treatment from 05/05/2019 in [facility] ECT Treatment from 03/17/2019 in [facility] ECT Treatment from 09/07/2018 in [facility]  MADRS Total Score 39 39 16 34 22      GAD-7    Flowsheet Row Office Visit from 04/29/2023 in [facility]  Total GAD-7 Score 12      Mini-Mental    Flowsheet Row ECT Treatment from 07/21/2020 in [facility] ECT Treatment from 05/05/2019 in [facility] ECT Treatment from 03/17/2019 in [facility] ECT Treatment from 09/07/2018 in [facility] ECT Treatment from 08/31/2018 in [facility]  Total Score (max 30 points ) 30 30 30 30 30       PHQ2-9    Flowsheet Row Office Visit from 06/23/2023 in [facility] Office Visit from 04/29/2023 in [facility] Video Visit from [facility]/19/2022 [facility] Video Visit from 09[facility]05/2022 in [facility] Video Visit from 07/25/2022 in [facility]  PHQ-2 Total Score 6 1 1 6  0  PHQ-9 Total Score 27 5 -- 23 --      Flowsheet Row Office Visit from 06/23/2023 in [facility] Office Visit from 04/29/2023 in [facility] Video Visit from 12/19/2022 in [facility]  C-SSRS RISK CATEGORY Error: Q7 should not be populated when Q6 is No Error: Q3, 4, or 5 should not be populated when Q2 is No No Risk        Assessment and Plan:  Matthew [name] is a 52  y.o. year old male with a history of bipolar II disorder, anxiety, panic 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, insomnia. The patient was transferred from Dr. Michae Kava   2. Bipolar 2 disorder (HCC) 3. GAD (generalized anxiety disorder) 4. Panic 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 Acute stressors include:  starting a new job at [employer], spending more time with his family at home Other stressors include: divorce in the past, loss of his parents, his son with Asperger's    History:Tx from [facility] ECT in 2023 - he did not like the experience in relation to anesthesia. Per Dr. Arline Asp note"reports hx of hypomanic like episodes- sleeping only 4 hr/night, increased energy, elevated mood, grandiose thoughts, starting a lot of projects but not finishing anything, sexually inappropriate behavior. During this time he still goes to work and completes his responsibilities)" "reports when angry he will destroy property and break things."  Exam is notable for tearfulness, restricted affect, and he reports significant worsening in depressive symptoms with SI since the last visit.   It's noted that the previous provider did not maintain antipsychotics or mood stabilizers, despite the patient having experienced hypomanic episodes in the past. The reasons for this  are unclear, though it appears he tried some medications that resulted in side effects.  He is willing to try lumateperone for bipolar depression.  Discussed potential metabolic side effect, EPS and QTc prolongation.  He has no known heart disease; will start this medication prior to obtain EKG as the risk is low, and given benefits outweigh the risk.  Will continue current dose of buspirone at this time to target anxiety.  Will continue clonazepam as needed for anxiety.  It is noted that, although I was unable to fully assess the interaction with his father due to time constraints, he may be experiencing PTSD. I will continue to assess this in future sessions.  # SI  Screen has  a long discussion about inpatient treatment.  He is concerned about its impact on his work, and would like to discuss with his wife prior to this. He is willing to engage in the treatment including the adjustment of his medication, having sooner follow up.  Although he has guns and he has access to those, this is for self protection, and he adamantly denies any plan to use these for self-harm.  He agrees that this Clinical research associate communicates with his wife so that she is aware of the situation any case of any help is needed.  Given all these factors and assessment as outlined below, will not proceed with IVC at this time. Emergency resources which includes 911, ED, suicide crisis line (988) are discussed.    # HI Although he reports HI against his father, he denies any intent or plan.  He states that he does not want to go out and harm others.  He expressed understanding to contact emergency resources if any worsening.  Will not wanr the police or his father at this time as the risk is not imminent. Will continue to assess this.   5. Marijuana use He is willing to cut down marijuana use given concern of hallucinations.  Will continue motivational interview.   # Alcohol use He agrees to cut down the alcohol use to mitigate any risk.  Noted that he has a family history of alcohol and drug use.   # benzodiazepine use  He has been on lorazepam for many years and has successfully reduced the frequency of use. We will continue to work towards a gradual tapering of this medication, considering his history of alcohol use and family history of alcohol and substance use.  Discussed potential risk of respiratory suppression with concomitant use of alcohol, and oversedation.   1. High risk medication use Obtain EKG to monitor QTc, and UDS given he is on clonazepam.     Plan Start lumateperone 10.5 mg daily  Continue buspirone 7.5 mg in AM, 15 mg at night  Continue lorazepam 1 mg twice  a day as needed for anxiety, 75  tablets (previously on 1 mg TID) Obtain EKG  Obtain EKG - please call [phone] or [phone] to make an appointment  Obtain UDS Obtain ROI for 2- way conversation with his wife Next appointment: 11/19 at 1 PM, IP  Addendum: Spoke with his wife, Research officer, political party (Spouse) [phone] ([place])   She states that she thinks he has been doing good, although she is aware that anxiety is high.  However, compared to before, has been doing a lot better.  She agrees that they have challenges especially with their daughter, who is a teenager.  She has difficulty in fitting in.  She has been trying to figure out how to navigate  her.  On the day [name] told her to get up and go, it was a day she was trying to be a good parent.  She thinks they were not on the same page.  They have been taking a walk together.  They have family time at dinner with their son.  They have been laughing and joking.  She is aware of SI.  She listens to him, and there is a "safe word" he can use in case if he needs any help.  She is trying to respect him about hospitalization. She will bring if that is needed. However, she does not think he is at that point this time. She thinks there has been a lot of changes, including there is some moving out, and changing his job.  Although there are guns, bullets are locked in another safe, and he does not know where the keys are.  She does not have any concern about him doing things to hurt himself or others.  She states that they have been married for 21 years, and he is an amazing person. She got him, and she will be there for him.  She expressed her appreciation for the phone call.    Past trials of medication: citalopram, Paxil, lexapro, venlafaxine (zombie), mirtazapine, bupropion, buspirone, Abilify, latuda,Depakote, lithium, carbamazepine, quetiapine (sedative), Atomoxetine (depressed, SI)    The patient demonstrates the following risk factors for suicide: Chronic risk factors  for suicide include: psychiatric disorder of bipolar disorder . Acute risk factors for suicide include: family conflict. Protective factors for this patient include: positive social support, responsibility to others (children, family), coping skills, and hope for the future. Considering these factors, the overall suicide risk at this point appears to be elevated, but not at imminent risk. Patient is appropriate for outpatient follow up.  He has guns, which are locked.  Emergency resources which includes 911, ED, suicide crisis line (988) are discussed.      Collaboration of Care: Collaboration of Care: Other reviewed notes in Epic  Patient/Guardian was advised Release of Information must be obtained prior to any record release in order to collaborate their care with an outside provider. Patient/Guardian was advised if they have not already done so to contact the registration department to sign all necessary forms in order for [place] to release information regarding their care.   Consent: Patient/Guardian gives verbal consent for treatment and assignment of benefits for services provided during this visit. Patient/Guardian expressed understanding and agreed to proceed.    The duration of the time spent on the following activities on the date of the encounter was 88 minutes.   Preparing to see the patient (e.g., review of test, records)  Obtaining and/or reviewing separately obtained history  Performing a medically necessary exam and/or evaluation  Counseling and educating the patient/family/caregiver  Ordering medications, tests, or procedures  Referring and communicating with other healthcare professionals (when not reported separately)  Documenting clinical information in the electronic or paper health record  Independently interpreting results of tests/labs and communication of results to the family or caregiver  Care coordination (when not reported separately)   Neysa Hotter, MD 06/23/2023,  5:27 PM

## 2023-06-23 ENCOUNTER — Ambulatory Visit: Payer: BC Managed Care – PPO | Admitting: Psychiatry

## 2023-06-23 ENCOUNTER — Telehealth: Payer: Self-pay

## 2023-06-23 ENCOUNTER — Encounter: Payer: Self-pay | Admitting: Psychiatry

## 2023-06-23 ENCOUNTER — Other Ambulatory Visit (HOSPITAL_COMMUNITY): Payer: Self-pay

## 2023-06-23 VITALS — BP 145/91 | HR 90 | Temp 95.0°F | Ht 77.0 in | Wt 248.8 lb

## 2023-06-23 DIAGNOSIS — F1290 Cannabis use, unspecified, uncomplicated: Secondary | ICD-10-CM

## 2023-06-23 DIAGNOSIS — F410 Panic disorder [episodic paroxysmal anxiety] without agoraphobia: Secondary | ICD-10-CM | POA: Diagnosis not present

## 2023-06-23 DIAGNOSIS — Z79899 Other long term (current) drug therapy: Secondary | ICD-10-CM

## 2023-06-23 DIAGNOSIS — F411 Generalized anxiety disorder: Secondary | ICD-10-CM

## 2023-06-23 DIAGNOSIS — F3181 Bipolar II disorder: Secondary | ICD-10-CM

## 2023-06-23 MED ORDER — LUMATEPERONE TOSYLATE 10.5 MG PO CAPS
10.5000 mg | ORAL_CAPSULE | Freq: Every day | ORAL | 0 refills | Status: DC
  Filled 2023-06-23: qty 30, 30d supply, fill #0

## 2023-06-23 NOTE — Telephone Encounter (Signed)
caplyta was approved from 06-23-23 to 06-22-24 refer # [id number]

## 2023-06-23 NOTE — Telephone Encounter (Signed)
 prior Matthew [name] was submitted and is pending

## 2023-06-23 NOTE — Telephone Encounter (Signed)
received fax that a prior auth was needed for the caplyta.

## 2023-06-27 ENCOUNTER — Other Ambulatory Visit (HOSPITAL_COMMUNITY): Payer: Self-pay

## 2023-06-30 ENCOUNTER — Other Ambulatory Visit (HOSPITAL_COMMUNITY): Payer: Self-pay

## 2023-07-04 NOTE — Progress Notes (Unsigned)
BH MD/PA/NP OP Progress Note  07/08/2023 1:44 PM Matthew [name]  MRN:  [id number]  Chief Complaint:  Chief Complaint  Patient presents with   Follow-up   HPI:  This is a follow-up appointment for bipolar 2 disorder, anxiety.  He states that he was not sure about Caplyta, and has not started this yet. He has started quetiapine, and currently takes 100 mg. He does not feel drowsy as he used to.  He states that he was undergoing a lot of stress, including change in his job, his son moving out, and his daughter.  His daughter has been back to school, made alleviate some of the stress.  He has more communication with his wife. When this writer shared the impression after speaking with his wife, he agreed that he tends to jump to conclusions especially when he is stressed.  He was offered validation, and was provided psycho education about cognitive distortion.  He is willing to try CBT.  The patient has mood symptoms as in PHQ-9/GAD-7. He sleeps up to 8 hours.  He eats once a day, lat he agrees to tryer in the day.  He agrees to have some breakfast.  Although he does have anxiety, it has been less, and less consuming prior to before. He has occasional passive fleeting SI, which has been improving.  Although he was feeling that he did not want to exist on earth, he does not feel that way anymore. He agrees with the plans as outlined below.   Substance use   Tobacco Alcohol Other substances/  Current  smokes 2-3 shots of liquor every day after work Marijuana every day or less to feel relax   Past   "2002 drink excessively" per chart review "Cocaine use 2002 LSD 23 years ago " per chart review  Past Treatment              Household: wife, four children Marital status: married, 21 years of marriage. Divorced before Number of children: 22, age 34- 40's. 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 Bipolar 2 disorder (HCC)  F31.81     2. GAD (generalized anxiety disorder)  F41.1 Ambulatory referral to Psychology    LORazepam (ATIVAN) 1 MG tablet    3. 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 I have reviewed the history. No updates at this time.     Past Medical History:  Past Medical History:  Diagnosis Date   Acute kidney failure (HCC) 01/2012   Anxiety    Bipolar disorder (HCC)    hypomania   COPD (chronic obstructive pulmonary disease) (HCC)    Depression    Low back pain     Past Surgical History:  Procedure Laterality Date   NO PAST SURGERIES      Family Psychiatric History: Please see initial evaluation for full details. I have reviewed the history. No updates at this time.     Family History:  Family History  Problem Relation Age of Onset  Anxiety disorder Mother    Alcohol abuse Father    Drug abuse Father    Bipolar disorder Sister    Alcohol abuse Brother    Drug abuse Brother    Bipolar disorder Paternal Aunt    Schizophrenia Paternal Aunt    Hypertension Maternal Grandfather    Depression Maternal Grandmother    Cancer Maternal Grandmother        lung   Hypertension Paternal Grandfather    Cancer Paternal Grandmother        lung   Diabetes Neg Hx    Heart disease Neg Hx    Hyperlipidemia Neg Hx    Stroke Neg Hx    Suicidality Neg Hx     Social History:  Social History   Socioeconomic History   Marital status: Married    Spouse name: Not on file   Number of children: Not on file   Years of education: Not on file   Highest education level: Not on file  Occupational History    Not on file  Tobacco Use   Smoking status: Every Day    Current packs/day: 2.00    Average packs/day: 2.0 packs/day for 20.0 years (40.0 ttl pk-yrs)    Types: Cigarettes   Smokeless tobacco: Never  Vaping Use   Vaping status: Every Day   Substances: CBD  Substance and Sexual Activity   Alcohol use: Yes    Alcohol/week: 3.0 standard drinks of alcohol    Types: 3 Shots of liquor per week   Drug use: Yes    Types: Marijuana    Comment: daily- quit 1 month ago   Sexual activity: Yes    Partners: Female    Birth control/protection: None  Other Topics Concern   Not on file  Social History Narrative   Not on file   Social Determinants of Health   Financial Resource Strain: Not on file  Food Insecurity: Not on file  Transportation Needs: Not on file  Physical Activity: Not on file  Stress: Not on file  Social Connections: Not on file    Allergies:  Allergies  Allergen Reactions   Ciprofloxacin     Acute kidney failure   Codeine Nausea And Vomiting    Metabolic Disorder Labs: Lab Results  Component Value Date   HGBA1C 5.3 04/07/2020   MPG 105 04/07/2020   MPG 103 02/03/2014   Lab Results  Component Value Date   PROLACTIN 7.4 03/09/2019   PROLACTIN 15.7 (H) 03/12/2018   Lab Results  Component Value Date   CHOL 161 03/09/2019   TRIG 174.0 (H) 03/09/2019   HDL 30.60 (L) 03/09/2019   CHOLHDL 5 03/09/2019   VLDL 34.8 03/09/2019   LDLCALC 96 03/09/2019   LDLCALC 57 03/12/2018   Lab Results  Component Value Date   TSH 1.200 05/20/2023   TSH 0.86 04/07/2020    Therapeutic Level Labs: Lab Results  Component Value Date   LITHIUM 0.6 03/12/2018   LITHIUM 0.9 04/24/2017   Lab Results  Component Value Date   VALPROATE 39.1 (L) 03/09/2019   VALPROATE 34 (L) 03/12/2018   No results found for: "CBMZ"  Current Medications: Current Outpatient Medications  Medication Sig Dispense Refill   atomoxetine (STRATTERA) 100 MG capsule Take 1 capsule (100 mg  total) by mouth daily. 90 capsule 0   baclofen (LIORESAL) 10 MG tablet Take 1 tablet (10 mg total) by mouth 3 (three) times daily as needed for muscle spasms. 45 tablet 3   cyclobenzaprine (  FLEXERIL) 10 MG tablet Take 1 tablet (10 mg total) by mouth 3 (three) times daily as needed. 30 tablet 0   QUEtiapine (SEROQUEL) 100 MG tablet Take 1 tablet (100 mg total) by mouth at bedtime. 30 tablet 2   tadalafil (CIALIS) 10 MG tablet Take 1 tablet (10 mg total) by mouth daily as needed for erectile dysfunction. 10 tablet 1   valACYclovir (VALTREX) 1000 MG tablet Take 2 tablets (2,000 mg total) by mouth 2 (two) times daily. 4 tablet 6   busPIRone (BUSPAR) 15 MG tablet Take 1/2 tablet (7.5 mg) by mouth daily AND 1 tablet (15 mg total) at bedtime. 45 tablet 2   [START ON 07/27/2023] LORazepam (ATIVAN) 1 MG tablet May take 1 tablet (1 mg total) by mouth 2 (two) times daily as needed for anxiety. May also take 1 tablet (1 mg total) every other day as needed for anxiety. 75 tablet 1   No current facility-administered medications for this visit.     Musculoskeletal: Strength & Muscle Tone: within normal limits Gait & Station: normal Patient leans: N/A  Psychiatric Specialty Exam: Review of Systems  Psychiatric/Behavioral:  Positive for decreased concentration, dysphoric mood, sleep disturbance and suicidal ideas. Negative for agitation, behavioral problems, confusion, hallucinations and self-injury. The patient is nervous/anxious. The patient is not hyperactive.   All other systems reviewed and are negative.   Blood pressure 138/88, pulse (!) 113, temperature 98.8 F (37.1 C), temperature source Temporal, height 6\' 5"  (1.956 m), weight 249 lb 6.4 oz (113.1 kg), SpO2 93%.Body mass index is 29.57 kg/m.  General Appearance: Well Groomed  Eye Contact:  Good  Speech:  Clear and Coherent  Volume:  Normal  Mood:  Anxious  Affect:  Appropriate, Congruent, and brighter, smiles, calm  Thought Process:  Coherent   Orientation:  Full (Time, Place, and Person)  Thought Content: Logical   Suicidal Thoughts:  Yes.  without intent/plan  Homicidal Thoughts:  No  Memory:  Immediate;   Good  Judgement:  Good  Insight:  Good  Psychomotor Activity:  Normal  Concentration:  Concentration: Good and Attention Span: Good  Recall:  Good  Fund of Knowledge: Good  Language: Good  Akathisia:  No  Handed:  Right  AIMS (if indicated): not done  Assets:  Communication Skills Desire for Improvement  ADL's:  Intact  Cognition: WNL  Sleep:  Fair   Screenings: ECT-MADRS    Flowsheet Row ECT Treatment from 04/19/2022 in [facility] ECT Treatment from 07/21/2020 in [facility] ECT Treatment from 05/05/2019 in [facility] ECT Treatment from 03/17/2019 in [facility] ECT Treatment from 09/07/2018 in [facility]  MADRS Total Score 39 39 16 34 22      GAD-7    Flowsheet Row Office Visit from 07/08/2023 in [facility] Office Visit from 04/29/2023 in [facility]  Total GAD-7 Score 21 12      Mini-Mental    Flowsheet Row ECT Treatment from 07/21/2020 in [facility] ECT Treatment from 05/05/2019 in [facility] ECT Treatment from 03/17/2019 in [facility] ECT Treatment from 09/07/2018 in [facility] ECT Treatment from 08/31/2018 in [facility]  Total Score (max 30 points ) 30 30 30 30  30  PHQ2-9    Flowsheet Row Office Visit from 07/08/2023 in [facility] Office Visit from 06/23/2023 in [facility] Office Visit from 04/29/2023 in [facility] Video Visit from 12/19/2022 in [facility] [facility]  PHQ-2 Total Score 4 6 1 1 6   PHQ-9 Total Score 14 27 5  -- 23      Flowsheet Row Office Visit from 07/08/2023 in [facility] Office Visit from 06/23/2023 in [facility] Office Visit from 04/29/2023 in [facility]  C-SSRS RISK CATEGORY Error: Q3, 4, or 5 should not be populated when Q2 is No Error: Q7 should not be populated when Q6 is No Error: Q3, 4, or 5 should not be populated when Q2 is No        Assessment and Plan:  Matthew [name] is a 52 y.o. year old male with a history of bipolar II disorder, anxiety, panic 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, insomnia. The patient was transferred from Dr. Michae Kava   1. Bipolar 2 disorder (HCC) 2. GAD (generalized anxiety disorder) 3. Panic 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 Acute stressors include:  starting a new job at [employer], spending more time with his family at home Other stressors include: divorce in the past, loss of his parents, his son with Asperger's, conflict with his father    History:Tx from [place]. ECT in 2023 - he did not like the experience in relation to anesthesia. Per Dr. Arline Asp note"reports hx of hypomanic like episodes- sleeping only 4 hr/night, increased energy, elevated mood, grandiose thoughts, starting a lot of projects but not finishing anything, sexually inappropriate behavior. During this time he still goes to work and completes his responsibilities)" "reports when angry he will destroy property and break things."    Exam is notable for brighter, calmer affect, and there has been improvement in anxiety, depressive symptoms and SI since the last visit.  It is likely secondary to having more communication  with his wife, some improvement in stressors (his daughter started to go to school), and reinitiation of quetiapine.  Although the plan was to start lumateperone, he denies any drowsiness from quetiapine.  This will be a reasonable treatment plan to target bipolar depression.  Discussed potential metabolic side effect, EPS, and QTc prolongation.  Will continue current dose of buspirone to target anxiety along with clonazepam as needed for anxiety.  He will greatly benefit from CBT; will make a referral.    5. Marijuana use # Alcohol use He is at pre contemplative stage, and denies having any issues with dependency on marijuana, alcohol use., although he did reduce the frequency of use.  Will continue motivational interview.    # benzodiazepine use  He has been on lorazepam for many years and has successfully reduced the frequency of use. Will continue to work towards a gradual tapering of this medication, considering his history of alcohol use and family history of alcohol and substance use.  Discussed potential risk of respiratory suppression with concomitant use of alcohol, and oversedation.    1. High risk medication use Obtain EKG to monitor QTc, and UDS given he is on clonazepam.     Plan Continue quetiapine 100 mg at night  Continue buspirone 7.5 mg in AM, 15 mg at night  Continue lorazepam 1 mg twice  a day as needed for anxiety,  75 tablets (previously on 1 mg TID) Obtain EKG  Obtain EKG - please call [phone] or [phone] to make an appointment  Obtain UDS Referral to therapy  Next appointment: 1/20 at 8 am, IP   Past trials of medication: citalopram, Paxil, lexapro, venlafaxine (zombie), mirtazapine, bupropion, buspirone, Abilify, latuda,Depakote, lithium, carbamazepine, quetiapine (sedative), Atomoxetine (depressed, SI)    The patient demonstrates the following risk factors for suicide: Chronic risk factors for suicide include: psychiatric disorder of bipolar disorder . Acute  risk factors for suicide include: family conflict. Protective factors for this patient include: positive social support, responsibility to others (children, family), coping skills, and hope for the future. Considering these factors, the overall suicide risk at this point appears to be elevated, but not at imminent risk. Patient is appropriate for outpatient follow up.  He has guns, which are locked.  Emergency resources which includes 911, ED, suicide crisis line (988) are discussed.      Collaboration of Care: Collaboration of Care: Other reviewed notes in Epic  Patient/Guardian was advised Release of Information must be obtained prior to any record release in order to collaborate their care with an outside provider. Patient/Guardian was advised if they have not already done so to contact the registration department to sign all necessary forms in order for [place] to release information regarding their care.   Consent: Patient/Guardian gives verbal consent for treatment and assignment of benefits for services provided during this visit. Patient/Guardian expressed understanding and agreed to proceed.    Neysa Hotter, MD 07/08/2023, 1:44 PM

## 2023-07-08 ENCOUNTER — Other Ambulatory Visit (HOSPITAL_COMMUNITY): Payer: Self-pay

## 2023-07-08 ENCOUNTER — Encounter: Payer: Self-pay | Admitting: Psychiatry

## 2023-07-08 ENCOUNTER — Ambulatory Visit (INDEPENDENT_AMBULATORY_CARE_PROVIDER_SITE_OTHER): Payer: BC Managed Care – PPO | Admitting: Psychiatry

## 2023-07-08 VITALS — BP 138/88 | HR 113 | Temp 98.8°F | Ht 77.0 in | Wt 249.4 lb

## 2023-07-08 DIAGNOSIS — F3181 Bipolar II disorder: Secondary | ICD-10-CM | POA: Diagnosis not present

## 2023-07-08 DIAGNOSIS — F411 Generalized anxiety disorder: Secondary | ICD-10-CM

## 2023-07-08 DIAGNOSIS — F410 Panic disorder [episodic paroxysmal anxiety] without agoraphobia: Secondary | ICD-10-CM

## 2023-07-08 MED ORDER — LORAZEPAM 1 MG PO TABS
ORAL_TABLET | ORAL | 1 refills | Status: DC
  Filled 2023-07-28: qty 75, 30d supply, fill #0
  Filled 2023-08-28: qty 75, 30d supply, fill #1

## 2023-07-08 MED ORDER — QUETIAPINE FUMARATE 100 MG PO TABS
100.0000 mg | ORAL_TABLET | Freq: Every day | ORAL | 2 refills | Status: DC
  Filled 2023-07-08: qty 30, 30d supply, fill #0
  Filled 2023-08-28: qty 30, 30d supply, fill #1

## 2023-07-08 MED ORDER — BUSPIRONE HCL 15 MG PO TABS
ORAL_TABLET | ORAL | 2 refills | Status: DC
  Filled 2023-07-08: qty 45, 30d supply, fill #0
  Filled 2023-08-28: qty 45, 30d supply, fill #1

## 2023-07-08 NOTE — Patient Instructions (Addendum)
Continue quetiapine 100 mg at night  Continue buspirone 7.5 mg in AM, 15 mg at night  Continue lorazepam 1 mg twice  a day as needed for anxiety, 75 tablets  Obtain EKG  Obtain EKG - please call [phone] to make an appointment  Obtain UDS Referral to therapy  Next appointment: 1/20 at 8 am

## 2023-07-09 ENCOUNTER — Other Ambulatory Visit
Admission: RE | Admit: 2023-07-09 | Discharge: 2023-07-09 | Disposition: A | Discharge status: Home / Self Care | Payer: BC Managed Care – PPO | Source: Ambulatory Visit | Attending: Psychiatry | Admitting: Psychiatry

## 2023-07-09 ENCOUNTER — Ambulatory Visit
Admission: RE | Admit: 2023-07-09 | Discharge: 2023-07-09 | Disposition: A | Discharge status: Home / Self Care | Payer: BC Managed Care – PPO | Source: Ambulatory Visit | Attending: Psychiatry | Admitting: Psychiatry

## 2023-07-09 DIAGNOSIS — Z79899 Other long term (current) drug therapy: Secondary | ICD-10-CM

## 2023-07-09 DIAGNOSIS — Z5181 Encounter for therapeutic drug level monitoring: Secondary | ICD-10-CM | POA: Diagnosis not present

## 2023-07-10 NOTE — Progress Notes (Signed)
Please inform the patient that the EKG shows no significant abnormalities. Advise him to continue taking the medication as discussed during his previous visit.

## 2023-07-10 NOTE — Progress Notes (Signed)
Called patient as instructed by provider to discuss EKG results and advised patient to continue taking the medication as discussed at previous visit

## 2023-07-11 ENCOUNTER — Telehealth: Payer: Self-pay

## 2023-07-11 NOTE — Telephone Encounter (Signed)
-----   Message from Matthew [name] sent at 07/10/2023 10:52 AM EST ----- Please inform the patient that the EKG shows no significant abnormalities. Advise him to continue taking the medication as discussed during his previous visit.

## 2023-07-11 NOTE — Telephone Encounter (Signed)
Pt.notified

## 2023-07-14 ENCOUNTER — Encounter: Payer: Self-pay | Admitting: Psychiatry

## 2023-07-14 LAB — URINE DRUG PANEL 7
Amphetamines, Urine: NEGATIVE ng/mL
Barbiturate, Ur: NEGATIVE ng/mL
Benzodiazepine Quant, Ur: NEGATIVE ng/mL
Cannabinoid Quant, Ur: POSITIVE ng/mL — AB
Cocaine (Metab.): NEGATIVE ng/mL
Opiate Quant, Ur: NEGATIVE ng/mL
Phencyclidine, Ur: NEGATIVE ng/mL

## 2023-07-23 ENCOUNTER — Other Ambulatory Visit (HOSPITAL_COMMUNITY): Payer: Self-pay

## 2023-07-28 ENCOUNTER — Other Ambulatory Visit (HOSPITAL_COMMUNITY): Payer: Self-pay

## 2023-07-28 ENCOUNTER — Other Ambulatory Visit: Payer: Self-pay

## 2023-08-28 ENCOUNTER — Other Ambulatory Visit (HOSPITAL_COMMUNITY): Payer: Self-pay

## 2023-09-01 NOTE — Progress Notes (Signed)
BH MD/PA/NP OP Progress Note  09/08/2023 8:39 AM Matthew [name]  MRN:  [id number]  Chief Complaint:  Chief Complaint  Patient presents with   Follow-up   HPI:  This is a follow-up appointment for bipolar disorder, anxiety.  He states that he discontinued quetiapine and alcohol as he was physically feeling sick, and feeling bad around Thanksgiving.  Although he took quetiapine after a week around new year, he discontinued it a few days ago.  He was unable to do anything and was just sitting there.  He thinks the medication made him being in a "fog," retaining fluid as well.  He thinks he has been better since discontinuation.  He thinks the communication with his family has been getting better.  His wife thinks he might be overreacting in terms of parenting.  He agrees that he may get verbally aggressive on certain occasion.  Although his wife comments to him that he tends to get angry, he thinks he has been this way, although he does enjoy doing things.  He sleeps several hours.  He denies change in appetite.  Although there were a few days he did not care, he denies SI.  He continues to use marijuana as it allows him to feel relax and slow down.  He denies decreased need for sleep, euphoria, or increased goal-directed activity.    Wt Readings from Last 3 Encounters:  09/08/23 258 lb (117 kg)  07/08/23 249 lb 6.4 oz (113.1 kg)  06/23/23 248 lb 12.8 oz (112.9 kg)     Substance use   Tobacco Alcohol Other substances/  Current  smokes Denies. Last use on thanksgiving. Used to drink  2-3 shots of liquor every day after work Marijuana every day or less to feel relax   Past   "2002 drink excessively" per chart review "Cocaine use 2002 LSD 23 years ago " per chart review  Past Treatment              Household: wife, four children Marital status: married, 21 years of marriage. Divorced before Number of children: 59, age 72- 63's. 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 Bipolar 2 disorder (HCC)  F31.81     2. GAD (generalized anxiety disorder)  F41.1 LORazepam (ATIVAN) 1 MG tablet    3. Panic 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  F41.0 LORazepam (ATIVAN) 1 MG tablet      Past Psychiatric History: Please see initial evaluation for full details. I have reviewed the history. No updates at this time.     Past Medical History:  Past Medical History:  Diagnosis Date   Acute kidney failure (HCC) 01/2012   Anxiety    Bipolar disorder (HCC)    hypomania   COPD (chronic obstructive pulmonary disease) (HCC)    Depression    Low back pain     Past Surgical History:  Procedure Laterality Date   NO PAST SURGERIES      Family Psychiatric History: Please see initial evaluation for full details. I have reviewed the history. No updates at this time.     Family History:  Family History  Problem Relation Age of Onset   Anxiety disorder Mother    Alcohol abuse Father    Drug abuse Father    Bipolar disorder Sister    Alcohol abuse Brother  Drug abuse Brother    Bipolar disorder Paternal Aunt    Schizophrenia Paternal Aunt    Hypertension Maternal Grandfather    Depression Maternal Grandmother    Cancer Maternal Grandmother        lung   Hypertension Paternal Grandfather    Cancer Paternal Grandmother        lung   Diabetes Neg Hx    Heart disease Neg Hx    Hyperlipidemia Neg Hx    Stroke Neg Hx    Suicidality Neg Hx     Social History:  Social History   Socioeconomic History   Marital status: Married    Spouse name: Not on file   Number of children: Not on file   Years of education: Not on file   Highest education level: Not on file  Occupational History   Not on file  Tobacco Use   Smoking status: Every Day    Current packs/day: 2.00    Average packs/day: 2.0 packs/day for 20.0 years  (40.0 ttl pk-yrs)    Types: Cigarettes   Smokeless tobacco: Never  Vaping Use   Vaping status: Every Day   Substances: CBD  Substance and Sexual Activity   Alcohol use: Yes    Alcohol/week: 3.0 standard drinks of alcohol    Types: 3 Shots of liquor per week   Drug use: Yes    Types: Marijuana    Comment: daily- quit 1 month ago   Sexual activity: Yes    Partners: Female    Birth control/protection: None  Other Topics Concern   Not on file  Social History Narrative   Not on file   Social Drivers of Health   Financial Resource Strain: Not on file  Food Insecurity: Not on file  Transportation Needs: Not on file  Physical Activity: Not on file  Stress: Not on file  Social Connections: Not on file    Allergies:  Allergies  Allergen Reactions   Ciprofloxacin     Acute kidney failure   Codeine Nausea And Vomiting    Metabolic Disorder Labs: Lab Results  Component Value Date   HGBA1C 5.3 04/07/2020   MPG 105 04/07/2020   MPG 103 02/03/2014   Lab Results  Component Value Date   PROLACTIN 7.4 03/09/2019   PROLACTIN 15.7 (H) 03/12/2018   Lab Results  Component Value Date   CHOL 161 03/09/2019   TRIG 174.0 (H) 03/09/2019   HDL 30.60 (L) 03/09/2019   CHOLHDL 5 03/09/2019   VLDL 34.8 03/09/2019   LDLCALC 96 03/09/2019   LDLCALC 57 03/12/2018   Lab Results  Component Value Date   TSH 1.200 05/20/2023   TSH 0.86 04/07/2020    Therapeutic Level Labs: Lab Results  Component Value Date   LITHIUM 0.6 03/12/2018   LITHIUM 0.9 04/24/2017   Lab Results  Component Value Date   VALPROATE 39.1 (L) 03/09/2019   VALPROATE 34 (L) 03/12/2018   No results found for: "CBMZ"  Current Medications: Current Outpatient Medications  Medication Sig Dispense Refill   atomoxetine (STRATTERA) 100 MG capsule Take 1 capsule (100 mg total) by mouth daily. 90 capsule 0   baclofen (LIORESAL) 10 MG tablet Take 1 tablet (10 mg total) by mouth 3 (three) times daily as needed for  muscle spasms. 45 tablet 3   cyclobenzaprine (FLEXERIL) 10 MG tablet Take 1 tablet (10 mg total) by mouth 3 (three) times daily as needed. 30 tablet 0   lumateperone tosylate (CAPLYTA) 10.5 MG capsule Take  1 capsule (10.5 mg total) by mouth daily. 30 capsule 1   tadalafil (CIALIS) 10 MG tablet Take 1 tablet (10 mg total) by mouth daily as needed for erectile dysfunction. 10 tablet 1   valACYclovir (VALTREX) 1000 MG tablet Take 2 tablets (2,000 mg total) by mouth 2 (two) times daily. 4 tablet 6   busPIRone (BUSPAR) 15 MG tablet Take 1/2 tablet (7.5 mg) by mouth daily AND 1 tablet (15 mg total) at bedtime. 45 tablet 2   [START ON 09/27/2023] LORazepam (ATIVAN) 1 MG tablet May take 1 tablet (1 mg total) by mouth 2 (two) times daily as needed for anxiety. May also take 1 tablet (1 mg total) every other day as needed for anxiety. 75 tablet 0   No current facility-administered medications for this visit.     Musculoskeletal: Strength & Muscle Tone: within normal limits Gait & Station: normal Patient leans: N/A  Psychiatric Specialty Exam: Review of Systems  Psychiatric/Behavioral:  Positive for dysphoric mood. Negative for agitation, behavioral problems, confusion, decreased concentration, hallucinations, self-injury, sleep disturbance and suicidal ideas. The patient is nervous/anxious. The patient is not hyperactive.   All other systems reviewed and are negative.   Blood pressure (!) 147/96, pulse 97, height 6\' 5"  (1.956 m), weight 258 lb (117 kg).Body mass index is 30.59 kg/m.  General Appearance: Well Groomed  Eye Contact:  Good  Speech:  Clear and Coherent  Volume:  Normal  Mood:  Depressed  Affect:  Appropriate, Congruent, and down  Thought Process:  Coherent  Orientation:  Full (Time, Place, and Person)  Thought Content: Logical   Suicidal Thoughts:  No  Homicidal Thoughts:  No  Memory:  Immediate;   Good  Judgement:  Good  Insight:  Good  Psychomotor Activity:  Normal   Concentration:  Concentration: Good and Attention Span: Good  Recall:  Good  Fund of Knowledge: Good  Language: Good  Akathisia:  No  Handed:  Right  AIMS (if indicated): not done  Assets:  Communication Skills Desire for Improvement  ADL's:  Intact  Cognition: WNL  Sleep:  Fair   Screenings: ECT-MADRS    Flowsheet Row ECT Treatment from 04/19/2022 in [facility] ECT Treatment from 07/21/2020 in [facility] ECT Treatment from 05/05/2019 in [facility] ECT Treatment from 03/17/2019 in [facility] ECT Treatment from 09/07/2018 in [facility]  MADRS Total Score 39 39 16 34 22      GAD-7    Flowsheet Row Office Visit from 07/08/2023 in [facility] Office Visit from 04/29/2023 in [facility]  Total GAD-7 Score 21 12      Mini-Mental    Flowsheet Row ECT Treatment from 07/21/2020 in [facility] ECT Treatment from 05/05/2019 in [facility] ECT Treatment from 03/17/2019 in [facility] ECT Treatment from 09/07/2018 in [facility] ECT Treatment from 08/31/2018 in [facility]  Total Score (max 30 points ) 30 30 30 30 30       PHQ2-9    Flowsheet Row Office Visit from 07/08/2023 in [facility] Office Visit from 06/23/2023 in [facility] Office Visit from 04/29/2023 in [facility] Video [facility] Visit from 09/05/2022 [facility]  PHQ-2 Total Score 4 6 1 1 6   PHQ-9  Total Score 14 27 5  -- 23      Flowsheet Row Office Visit from 07/08/2023 in [facility] Office Visit from 06/23/2023 in [facility] Office Visit from 04/29/2023 in [facility]  C-SSRS RISK CATEGORY Error: Q3, 4, or 5 should not be populated when Q2 is No Error: Q7 should not be populated when Q6 is No Error: Q3, 4, or 5 should not be populated when Q2 is No        Assessment and Plan:  ALVIE MAZZOCCHI [name] is a 53 y.o. year old male with a history of bipolar II disorder, anxiety, panic 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, insomnia, who presents for follow up for below.   1. GAD (generalized anxiety disorder) 2. Panic 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 3. Bipolar 2 disorder (HCC) Acute stressors include:  starting a new job at [employer], spending more time with his family at home Other stressors include: divorce in the past, loss of his parents, his son with Asperger's, conflict with his father    History:Tx from [facility] ECT in 2023 - he did not like the experience in relation to anesthesia. Per Dr. Arline Asp note"reports hx of hypomanic like episodes- sleeping only 4 hr/night, increased energy, elevated mood, grandiose thoughts, starting a lot of projects but not finishing anything, sexually inappropriate behavior. During this time he still goes to work and completes his responsibilities)" "reports when angry he will destroy property and break things."    Exam is notable for down affect, and he reports worsening in depressive symptoms, which coincided with discontinuation of quetiapine due to adverse reaction of drowsiness.  Will start lumateperone to target bipolar depression.  Discussed potential metabolic side effect, EPS and QTc prolongation.  Will continue current dose of buspirone to target anxiety along with clonazepam as needed for anxiety.  He will greatly benefit from CBT; will make  referral anxiety.    5. Marijuana use # Alcohol use Although he has been abstinent from alcohol use, he continues to use marijuana for anxiety.  Will continue motivational interview.   # benzodiazepine use  - was on 1 mg TID He has been on lorazepam for many years and has successfully reduced the frequency of use. Will continue to work towards a gradual tapering of this medication, considering his history of alcohol use and family history of alcohol and substance use.  Discussed potential risk of respiratory suppression with concomitant use of alcohol, and oversedation.      Plan Continue quetiapine 100 mg at night ( HR 87, corrected QTc 430 msec 06/2023) Continue buspirone 7.5 mg in AM, 15 mg at night  Continue lorazepam 1 mg twice  a day as needed for anxiety, 75 tablets  Referral for therapy onsite Next appointment: 2/27 at 4:30, IP   Past trials of medication: citalopram, Paxil, lexapro, venlafaxine (zombie), mirtazapine, bupropion, buspirone, Abilify, latuda,Depakote, lithium, carbamazepine, quetiapine (sedative), Atomoxetine (depressed, SI)    The patient demonstrates the following risk factors for suicide: Chronic risk factors for suicide include: psychiatric disorder of bipolar disorder . Acute risk factors for suicide include: family conflict. Protective factors for this patient include: positive social support, responsibility to others (children, family), coping skills, and hope for the future. Considering these factors, the overall suicide risk at this point appears to be elevated, but  not at imminent risk. Patient is appropriate for outpatient follow up.  He has guns, which are locked.  Emergency resources which includes 911, ED, suicide crisis line (988) are discussed.      Neysa Hotter, MD 09/08/2023, 8:39 AM

## 2023-09-08 ENCOUNTER — Encounter: Payer: Self-pay | Admitting: Psychiatry

## 2023-09-08 ENCOUNTER — Ambulatory Visit (INDEPENDENT_AMBULATORY_CARE_PROVIDER_SITE_OTHER): Payer: BC Managed Care – PPO | Admitting: Psychiatry

## 2023-09-08 ENCOUNTER — Other Ambulatory Visit (HOSPITAL_COMMUNITY): Payer: Self-pay

## 2023-09-08 VITALS — BP 147/96 | HR 97 | Ht 77.0 in | Wt 258.0 lb

## 2023-09-08 DIAGNOSIS — F410 Panic disorder [episodic paroxysmal anxiety] without agoraphobia: Secondary | ICD-10-CM | POA: Diagnosis not present

## 2023-09-08 DIAGNOSIS — F3181 Bipolar II disorder: Secondary | ICD-10-CM | POA: Diagnosis not present

## 2023-09-08 DIAGNOSIS — F411 Generalized anxiety disorder: Secondary | ICD-10-CM

## 2023-09-08 MED ORDER — LUMATEPERONE TOSYLATE 10.5 MG PO CAPS
10.5000 mg | ORAL_CAPSULE | Freq: Every day | ORAL | 1 refills | Status: DC
  Filled 2023-09-08: qty 30, 30d supply, fill #0

## 2023-09-08 MED ORDER — LORAZEPAM 1 MG PO TABS
ORAL_TABLET | ORAL | 0 refills | Status: DC
  Filled 2023-09-29: qty 75, 30d supply, fill #0

## 2023-09-08 MED ORDER — BUSPIRONE HCL 15 MG PO TABS
ORAL_TABLET | ORAL | 2 refills | Status: AC
  Filled 2023-09-08 – 2023-12-01 (×2): qty 45, 30d supply, fill #0

## 2023-09-08 NOTE — Patient Instructions (Addendum)
Continue quetiapine 100 mg at night  Continue buspirone 7.5 mg in AM, 15 mg at night  Continue lorazepam 1 mg twice  a day as needed for anxiety, 75 tablets  per month Referral for therapy onsite Next appointment: 2/27 at 4:30

## 2023-09-09 ENCOUNTER — Other Ambulatory Visit (HOSPITAL_COMMUNITY): Payer: Self-pay

## 2023-09-16 ENCOUNTER — Other Ambulatory Visit (HOSPITAL_COMMUNITY): Payer: Self-pay

## 2023-09-17 ENCOUNTER — Ambulatory Visit: Payer: BC Managed Care – PPO | Admitting: Licensed Clinical Social Worker

## 2023-09-17 DIAGNOSIS — F410 Panic disorder [episodic paroxysmal anxiety] without agoraphobia: Secondary | ICD-10-CM | POA: Diagnosis not present

## 2023-09-17 DIAGNOSIS — F411 Generalized anxiety disorder: Secondary | ICD-10-CM

## 2023-09-17 DIAGNOSIS — F3181 Bipolar II disorder: Secondary | ICD-10-CM | POA: Diagnosis not present
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
## 2023-09-26 ENCOUNTER — Other Ambulatory Visit (HOSPITAL_COMMUNITY): Payer: Self-pay

## 2023-09-29 ENCOUNTER — Other Ambulatory Visit (HOSPITAL_COMMUNITY): Payer: Self-pay

## 2023-09-29 ENCOUNTER — Other Ambulatory Visit: Payer: Self-pay

## 2023-10-02 ENCOUNTER — Ambulatory Visit: Payer: Self-pay | Admitting: Licensed Clinical Social Worker

## 2023-10-02 DIAGNOSIS — F411 Generalized anxiety disorder: Secondary | ICD-10-CM

## 2023-10-02 DIAGNOSIS — F410 Panic disorder [episodic paroxysmal anxiety] without agoraphobia: Secondary | ICD-10-CM

## 2023-10-02 DIAGNOSIS — F3181 Bipolar II disorder: Secondary | ICD-10-CM

## 2023-10-02 NOTE — Progress Notes (Signed)
THERAPIST PROGRESS NOTE  Session Time: 1:59pm-2:56pm  Participation Level: Active  Behavioral Response: CasualAlertEuthymic  Type of Therapy: Individual Therapy  Treatment Goals addressed:  Goal: LTG: Matthew [name] will attend and participate in therapeutic, recreational and educational activities that support interpersonal effectiveness       Dates: Start:  09/17/23    Expected End:  03/16/24       Disciplines: Interdisciplinary, PROVIDER             Goal: LTG: Matthew [name] will recognize socially inappropriate behaviors and develop alternative behaviors     Dates: Start:  09/17/23    Expected End:  03/16/24       Disciplines: Interdisciplinary, PROVIDER             Goal: STG: Matthew [name] will demonstrate interest in social activities by initiating/joining social activity without prompts     Dates: Start:  09/17/23    Expected End:  03/16/24       Disciplines: Interdisciplinary, PROVIDER             Goal: STG: Matthew [name] will identify 2 behaviors that engage in social isolation      Goal: LTG: Matthew [name] will score less than 5 on the Generalized Anxiety Disorder 7 Scale (GAD-7)      Dates: Start:  09/17/23    Expected End:  03/16/24       Disciplines: Interdisciplinary, PROVIDER             Goal: STG: Report a 50% reduction in the frequency of rehearsing thoughts related to social interactions.       ProgressTowards Goals: Initial  Interventions: CBT and Supportive  Summary: Matthew [name] is a 53 y.o. male who presents with low mood, lack of motivation, difficulty concentrating, restlessness, uncontrollable worry, anxious feelings, ruminating thoughts, irritability, difficulty sleeping, tension, and suicidal thoughts. Pt was oriented times 5. Pt was cooperative and engaged. Pt denies HI/AVH.  Patient reports he is in the process of coming off his medications from his bipolar diagnosis and marks improvements as he "does not feel like a zombie."  Patient reports while on these  medications he would often think "I do not want to be here anymore."  Patient denies plan, intent, access to means.  Patient also reports while coming off medications he is noticing improvements with his depressive symptoms.  Patient continues to explore a diagnosis of autism and challenges his bipolar diagnosis.  Clinician and patient utilized therapeutic session to better understand presentation of anxiety for the patient.  Clinician and patient reviewed the cycle of avoidance under the anxiety trap.  Patient identifies when he experiences anxiety he often fixates on the worst case scenario and miss assigns blame for his participation in social situations.  Patient reports anxiety presents as him shutting down / isolating, experiencing chest pain, stomachaches, panic attacks and shortness of breath.  Patient reports he often feels dread and fear when anxious.  Discussed the role of avoidance and safety behaviors.  Patient reflected on situations in which she has canceled his plans to avoid social encounters.  Patient identified triggers for anxiety and irritability often result in feelings he has to partake" unjust rules", feeling trapped, and experiencing limitations to utilizing his voice.  Patient states "I am an angry person" and reflects on barriers to attending his daughter's basketball games due to his presentation / participation as a parent in the stands.   Patient identified as a child he would pick up on people who became angry with  him and learned to avoid saying specific things and experienced spirals of anxiety in middle school stating "I was alone."  Patient reflected on what he deems to be learned behaviors as a result of social interactions.  Patient reflected on communication differences with his wife.  Discussed ways in which patient is learning to navigate personal relationships with the understanding he may qualify for diagnosis of autism.  Also explored different parenting styles  between him and his wife.  Clinician explored symptomology patient believes to coincide with a diagnosis of autism.  Patient identified per his research he feels he mimics behaviors observed, has difficulties in social situations, difficulties with communication, struggles managing new situations, and engages in avoidant eye contact.  Suicidal/Homicidal: Yeswithout intent/plan  Therapist Response: Clinician utilized active listening to create a safe space for patient to process recent life events.  Clinician assessed for current symptoms, stressors, safety since last session.  Clinician educated patient on the cycle of avoidance related to anxiety.  Assessed for anxious symptoms and avoidant behaviors.  Worked with patient to identify triggers and unhelpful thinking patterns.  Explored patient's understanding of his bipolar diagnosis as well as his suspected diagnosis of autism.  Plan: Return again in 2 weeks.  Diagnosis: GAD (generalized anxiety disorder)  Panic 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  Bipolar 2 disorder (HCC)   Collaboration of Care: AEB psychiatrist can access notes and cln. Will review psychiatrists' notes. Check in with the patient and will see LCSW per availability. Patient agreed with treatment recommendations.   Patient/Guardian was advised Release of Information must be obtained prior to any record release in order to collaborate their care with an outside provider. Patient/Guardian was advised if they have not already done so to contact the registration department to sign all necessary forms in order for [place] to release information regarding their care.   Consent: Patient/Guardian gives verbal consent for treatment and assignment of benefits for services provided during this visit. Patient/Guardian expressed understanding and agreed to proceed.   Dereck Leep, LCSW 10/02/2023

## 2023-10-04 NOTE — Progress Notes (Unsigned)
Virtual Visit via Video Note  I connected with Matthew [name] on 10/09/23 at  8:00 AM EST by a video enabled telemedicine application and verified that I am speaking with the correct person using two identifiers.  Location: Patient: home Provider: office Persons participated in the visit- patient, provider    I discussed the limitations of evaluation and management by telemedicine and the availability of in person appointments. The patient expressed understanding and agreed to proceed.    I discussed the assessment and treatment plan with the patient. The patient was provided an opportunity to ask questions and all were answered. The patient agreed with the plan and demonstrated an understanding of the instructions.   The patient was advised to call back or seek an in-person evaluation if the symptoms worsen or if the condition fails to improve as anticipated.  Neysa Hotter, MD    [facility] MD/PA/NP OP Progress Note  10/09/2023 8:58 AM Matthew [name]  MRN:  [id number]  Chief Complaint:  Chief Complaint  Patient presents with   Follow-up   HPI:  This is a follow-up appointment for bipolar II disorder, anxiety, marijuana use.  He states that he discontinued Later after 10 days as he felt he did not want to be here.  He also felt like a zombie, he feels stuck.  It has been improved since discontinuation of this medication.  He states that the work is fine.  When he is asked about irritability, he denies any significant irritability, although he would say things when something pisses him off.  He does not yell or being loud.  He does not think it sits longer than a few days.  When he was asked to consider vraylar, he politely asks why this medication is needed.  He does not think he has a bipolar disorder.  When he was feeling depressed, it was a time he was on antipsychotics, which has resolved after discontinuation of the medication.  He also thinks he was very stressed when he  had some episode in the past.  He feels anxious most of the time.  He takes lorazepam in the morning as he is worried about many things, including his son, wife, job and others.  Although he has some rituals, it does not necessarily bother him even if he were not to follow those. He sleeps 5-6 hours with middle insomnia. He lost weight since discontinuation of quetiapine, and he has decrease in appetite.  He is not much concerned about this, considering his age and his work/lack of mobility.  He denies SI.  He denies decreased need for sleep or euphoria.  He politely expressed frustration about not being able to fill his lorazepam prescription on the day he went to the pharmacy. He acknowledged and understood that, as a controlled substance, a start date must be specified, and the pharmacy typically allows refills only 1-2 days before the expected date for the same reason.   Substance use   Tobacco Alcohol Other substances/  Current  smokes Denies. Last use on thanksgiving. Used to drink  2-3 shots of liquor every day after work Marijuana every day or less to feel relax   Past   "2002 drink excessively" per chart review "Cocaine use 2002 LSD 23 years ago " per chart review  Past Treatment            242 lbs Wt Readings from Last 3 Encounters:  09/08/23 258 lb (117 kg)  07/08/23 249 lb 6.4  oz (113.1 kg)  06/23/23 248 lb 12.8 oz (112.9 kg)      Household: wife, four children Marital status: married, 21 years of marriage. Divorced before Number of children: 32, age 78- 23's. 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 Bipolar 2 disorder (HCC)  F31.81     2. GAD (generalized anxiety disorder)  F41.1 LORazepam (ATIVAN) 1 MG tablet    3. Panic 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  F41.0 LORazepam (ATIVAN) 1 MG tablet      Past  Psychiatric History: Please see initial evaluation for full details. I have reviewed the history. No updates at this time.     Past Medical History:  Past Medical History:  Diagnosis Date   Acute kidney failure (HCC) 01/2012   Anxiety    Bipolar disorder (HCC)    hypomania   COPD (chronic obstructive pulmonary disease) (HCC)    Depression    Low back pain     Past Surgical History:  Procedure Laterality Date   NO PAST SURGERIES      Family Psychiatric History: Please see initial evaluation for full details. I have reviewed the history. No updates at this time.     Family History:  Family History  Problem Relation Age of Onset   Anxiety disorder Mother    Alcohol abuse Father    Drug abuse Father    Bipolar disorder Sister    Alcohol abuse Brother    Drug abuse Brother    Bipolar disorder Paternal Aunt    Schizophrenia Paternal Aunt    Hypertension Maternal Grandfather    Depression Maternal Grandmother    Cancer Maternal Grandmother        lung   Hypertension Paternal Grandfather    Cancer Paternal Grandmother        lung   Diabetes Neg Hx    Heart disease Neg Hx    Hyperlipidemia Neg Hx    Stroke Neg Hx    Suicidality Neg Hx     Social History:  Social History   Socioeconomic History   Marital status: Married    Spouse name: Not on file   Number of children: Not on file   Years of education: Not on file   Highest education level: Not on file  Occupational History   Not on file  Tobacco Use   Smoking status: Every Day    Current packs/day: 2.00    Average packs/day: 2.0 packs/day for 20.0 years (40.0 ttl pk-yrs)    Types: Cigarettes   Smokeless tobacco: Never  Vaping Use   Vaping status: Every Day   Substances: CBD  Substance and Sexual Activity   Alcohol use: Yes    Alcohol/week: 3.0 standard drinks of alcohol    Types: 3 Shots of liquor per week   Drug use: Yes    Types: Marijuana    Comment: daily- quit 1 month ago   Sexual activity: Yes     Partners: Female    Birth control/protection: None  Other Topics Concern   Not on file  Social History Narrative   Not on file   Social Drivers of Health   Financial Resource Strain: Not on file  Food Insecurity: Not on file  Transportation Needs: Not on file  Physical Activity: Not on file  Stress: Not on file  Social Connections: Not on  file    Allergies:  Allergies  Allergen Reactions   Ciprofloxacin     Acute kidney failure   Codeine Nausea And Vomiting    Metabolic Disorder Labs: Lab Results  Component Value Date   HGBA1C 5.3 04/07/2020   MPG 105 04/07/2020   MPG 103 02/03/2014   Lab Results  Component Value Date   PROLACTIN 7.4 03/09/2019   PROLACTIN 15.7 (H) 03/12/2018   Lab Results  Component Value Date   CHOL 161 03/09/2019   TRIG 174.0 (H) 03/09/2019   HDL 30.60 (L) 03/09/2019   CHOLHDL 5 03/09/2019   VLDL 34.8 03/09/2019   LDLCALC 96 03/09/2019   LDLCALC 57 03/12/2018   Lab Results  Component Value Date   TSH 1.200 05/20/2023   TSH 0.86 04/07/2020    Therapeutic Level Labs: Lab Results  Component Value Date   LITHIUM 0.6 03/12/2018   LITHIUM 0.9 04/24/2017   Lab Results  Component Value Date   VALPROATE 39.1 (L) 03/09/2019   VALPROATE 34 (L) 03/12/2018   No results found for: "CBMZ"  Current Medications: Current Outpatient Medications  Medication Sig Dispense Refill   baclofen (LIORESAL) 10 MG tablet Take 1 tablet (10 mg total) by mouth 3 (three) times daily as needed for muscle spasms. 45 tablet 3   busPIRone (BUSPAR) 15 MG tablet Take 1/2 tablet (7.5 mg) by mouth daily AND 1 tablet (15 mg total) at bedtime. 45 tablet 2   cyclobenzaprine (FLEXERIL) 10 MG tablet Take 1 tablet (10 mg total) by mouth 3 (three) times daily as needed. 30 tablet 0   [START ON 10/29/2023] LORazepam (ATIVAN) 1 MG tablet May take 1 tablet (1 mg total) by mouth 2 (two) times daily as needed for anxiety. May also take 1 tablet (1 mg total) every other day as  needed for anxiety. 75 tablet 1   tadalafil (CIALIS) 10 MG tablet Take 1 tablet (10 mg total) by mouth daily as needed for erectile dysfunction. 10 tablet 1   valACYclovir (VALTREX) 1000 MG tablet Take 2 tablets (2,000 mg total) by mouth 2 (two) times daily. 4 tablet 6   No current facility-administered medications for this visit.     Musculoskeletal: Strength & Muscle Tone: within normal limits Gait & Station: normal Patient leans: N/A  Psychiatric Specialty Exam: Review of Systems  Psychiatric/Behavioral:  Positive for sleep disturbance. Negative for agitation, behavioral problems, confusion, decreased concentration, dysphoric mood, hallucinations, self-injury and suicidal ideas. The patient is nervous/anxious. The patient is not hyperactive.   All other systems reviewed and are negative.   There were no vitals taken for this visit.There is no height or weight on file to calculate BMI.  General Appearance: Well Groomed  Eye Contact:  Good  Speech:  Clear and Coherent  Volume:  Normal  Mood:   fine  Affect:  Appropriate, Congruent, and calm  Thought Process:  Coherent  Orientation:  Full (Time, Place, and Person)  Thought Content: Logical   Suicidal Thoughts:  No  Homicidal Thoughts:  No  Memory:  Immediate;   Good  Judgement:  Good  Insight:  Good  Psychomotor Activity:  Normal  Concentration:  Concentration: Good and Attention Span: Good  Recall:  Good  Fund of Knowledge: Good  Language: Good  Akathisia:  No  Handed:  Right  AIMS (if indicated): not done  Assets:  Communication Skills Desire for Improvement  ADL's:  Intact  Cognition: WNL  Sleep:  Poor   Screenings: ECT-MADRS  Flowsheet Row ECT Treatment from 04/19/2022 in [facility] ECT Treatment from 07/21/2020 in [facility] ECT Treatment from 05/05/2019 in [facility] ECT Treatment from 03/17/2019 in [facility] ECT Treatment from 09/07/2018 in [facility]  MADRS Total Score 39 39 16 34 22      GAD-7    Flowsheet Row Counselor from 09/17/2023 in [facility] Office Visit from 07/08/2023 in [facility] Office Visit from 04/29/2023 in [facility]  Total GAD-7 Score 21 21 12       Mini-Mental    Flowsheet Row ECT Treatment from 07/21/2020 in [facility] ECT Treatment from 05/05/2019 in [facility] ECT Treatment from 03/17/2019 in [facility] ECT Treatment from 09/07/2018 in [facility] ECT Treatment from 08/31/2018 in [facility]  Total Score (max 30 points ) 30 30 30 30 30       PHQ2-9    Flowsheet Row Counselor from 09/17/2023 in [facility] Office Visit from 07/08/2023 in [facility] Office Visit from 06/23/2023 in [facility] Office Visit from 04/29/2023 in [facility] Video Visit from [facility]/2022 [facility]  PHQ-2 Total Score 2 4 6 1 1   PHQ-9 Total Score 7 14 27 5  --      Flowsheet Row Counselor from 09/17/2023 in [facility] Office Visit from 07/08/2023 in [facility] Office Visit from 06/23/2023 in [facility]  C-SSRS RISK CATEGORY Error: Q7 should not be populated when Q6 is No Error: Q3, 4, or 5 should not be populated when Q2 is No Error: Q7 should not be populated when Q6 is No        Assessment and Plan:  AKAASH VANDEWATER  [name] is a 53 y.o. year old male with a history of bipolar II disorder, anxiety, panic 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, insomnia, who presents for follow up for below.   1. GAD (generalized anxiety disorder) 2. Panic 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 3. Bipolar 2 disorder (HCC) r/o OCD Acute stressors include:  starting a new job at [employer], spending more time with his family at home Other stressors include: divorce in the past, loss of his parents, his son with Asperger's, conflict with his father    History:Tx from [place]. ECT in 2023 - he did not like the experience in relation to anesthesia. Per Dr. Arline Asp note"reports hx of hypomanic like episodes- sleeping only 4 hr/night, increased energy, elevated mood, grandiose thoughts, starting a lot of projects but not finishing anything, sexually inappropriate behavior. During this time he still goes to work and completes his responsibilities)" "reports when angry he will destroy property and break things."     He had adverse reaction of SI from [place]. He declined to start antipsychotic such as Vaylar, as he does not think he has bipolar disorder. It was explained at [facility] to the patient that we will keep this diagnosis in mind, considering his documented history and clinical course, to help prevent future episodes. He expressed understanding.  Will continue current dose of buspirone to target anxiety, and lorazepam as needed for  anxiety.  He will take that he reports symptoms, which could be related to OCD; continue to monitor this.  He will continue to see Ms. Perkins for therapy.    5. Marijuana use He continues to use marijuana to feel relax.  Provided psychoeducation regarding the possible negative aspect on the mental health.    # benzodiazepine use  - was on 1 mg TID, UDS positive for cannabinoid 06/2023 Unchanged. He has been on lorazepam for many years and has successfully reduced the frequency of use. Will continue to work towards a gradual tapering  of this medication, considering his history of alcohol use and family history of alcohol and substance use.  Discussed potential risk of respiratory suppression with concomitant use of alcohol, and oversedation. Discussed with him about the expectations regarding how this medication would be filled at the pharmacy.   Plan Continue buspirone 7.5 mg in AM, 15 mg at night  Continue lorazepam 1 mg twice  a day as needed for anxiety, 75 tablets  Next appointment: 4/17 at 8 am, IP   Past trials of medication: citalopram, Paxil, lexapro, venlafaxine (zombie), mirtazapine, bupropion, buspirone, Abilify, latuda, quetiapine (sedative), Lumateprone (SI),, Depakote, lithium, carbamazepine,  Atomoxetine (depressed, SI)    The patient demonstrates the following risk factors for suicide: Chronic risk factors for suicide include: psychiatric disorder of bipolar disorder . Acute risk factors for suicide include: family conflict. Protective factors for this patient include: positive social support, responsibility to others (children, family), coping skills, and hope for the future. Considering these factors, the overall suicide risk at this point appears to be low. Patient is appropriate for outpatient follow up.  He has guns, which are locked.  Emergency resources which includes 911, ED, suicide crisis line (988) are discussed.    A total of 40 minutes was spent on the following activities during the encounter date, which includes but is not limited to: preparing to see the patient (e.g., reviewing tests and records), obtaining and/or reviewing separately obtained history, performing a medically necessary examination or evaluation, counseling and educating the patient, family, or caregiver, ordering medications, tests, or procedures, referring and communicating with other healthcare professionals (when not reported separately), documenting clinical information in the electronic or paper health record, independently  interpreting test or lab results and communicating these results to the family or caregiver, and coordinating care (when not reported separately).   Collaboration of Care: Collaboration of Care: Other reviewed notes in Epic  Patient/Guardian was advised Release of Information must be obtained prior to any record release in order to collaborate their care with an outside provider. Patient/Guardian was advised if they have not already done so to contact the registration department to sign all necessary forms in order for [place] to release information regarding their care.   Consent: Patient/Guardian gives verbal consent for treatment and assignment of benefits for services provided during this visit. Patient/Guardian expressed understanding and agreed to proceed.    Neysa Hotter, MD 10/09/2023, 8:58 AM

## 2023-10-09 ENCOUNTER — Other Ambulatory Visit (HOSPITAL_COMMUNITY): Payer: Self-pay

## 2023-10-09 ENCOUNTER — Encounter: Payer: Self-pay | Admitting: Psychiatry

## 2023-10-09 ENCOUNTER — Ambulatory Visit (INDEPENDENT_AMBULATORY_CARE_PROVIDER_SITE_OTHER): Payer: Self-pay | Admitting: Psychiatry

## 2023-10-09 DIAGNOSIS — F411 Generalized anxiety disorder: Secondary | ICD-10-CM | POA: Diagnosis not present

## 2023-10-09 DIAGNOSIS — F410 Panic disorder [episodic paroxysmal anxiety] without agoraphobia: Secondary | ICD-10-CM | POA: Diagnosis not present

## 2023-10-09 DIAGNOSIS — F3181 Bipolar II disorder: Secondary | ICD-10-CM | POA: Diagnosis not present

## 2023-10-09 MED ORDER — LORAZEPAM 1 MG PO TABS
ORAL_TABLET | ORAL | 1 refills | Status: DC
  Filled 2023-10-29: qty 75, 30d supply, fill #0
  Filled 2023-12-01: qty 75, 30d supply, fill #1

## 2023-10-09 NOTE — Patient Instructions (Addendum)
Continue buspirone 7.5 mg in AM, 15 mg at night  Continue lorazepam 1 mg twice  a day as needed for anxiet Next appointment: 4/17 at 8 am

## 2023-10-15 ENCOUNTER — Other Ambulatory Visit: Payer: Self-pay

## 2023-10-15 ENCOUNTER — Encounter: Payer: Self-pay | Admitting: Oncology

## 2023-10-15 ENCOUNTER — Encounter
Admission: EM | Disposition: A | Discharge status: Home / Self Care | Payer: Self-pay | Source: Home / Self Care | Attending: Internal Medicine

## 2023-10-15 ENCOUNTER — Inpatient Hospital Stay
Admission: EM | Admit: 2023-10-15 | Discharge: 2023-10-16 | DRG: 322 | Disposition: A | Discharge status: Home / Self Care | Payer: BC Managed Care – PPO | Attending: Internal Medicine | Admitting: Internal Medicine

## 2023-10-15 ENCOUNTER — Ambulatory Visit (INDEPENDENT_AMBULATORY_CARE_PROVIDER_SITE_OTHER): Payer: Self-pay | Admitting: Oncology

## 2023-10-15 ENCOUNTER — Emergency Department: Payer: BC Managed Care – PPO

## 2023-10-15 VITALS — BP 158/102 | HR 92 | Temp 98.1°F | Ht 77.0 in | Wt 258.0 lb

## 2023-10-15 DIAGNOSIS — Z79899 Other long term (current) drug therapy: Secondary | ICD-10-CM | POA: Diagnosis not present

## 2023-10-15 DIAGNOSIS — Z813 Family history of other psychoactive substance abuse and dependence: Secondary | ICD-10-CM | POA: Diagnosis not present

## 2023-10-15 DIAGNOSIS — Z811 Family history of alcohol abuse and dependence: Secondary | ICD-10-CM

## 2023-10-15 DIAGNOSIS — F32A Depression, unspecified: Secondary | ICD-10-CM

## 2023-10-15 DIAGNOSIS — I214 Non-ST elevation (NSTEMI) myocardial infarction: Secondary | ICD-10-CM | POA: Diagnosis not present

## 2023-10-15 DIAGNOSIS — R079 Chest pain, unspecified: Secondary | ICD-10-CM | POA: Diagnosis not present

## 2023-10-15 DIAGNOSIS — I259 Chronic ischemic heart disease, unspecified: Secondary | ICD-10-CM

## 2023-10-15 DIAGNOSIS — F17210 Nicotine dependence, cigarettes, uncomplicated: Secondary | ICD-10-CM | POA: Diagnosis present

## 2023-10-15 DIAGNOSIS — F319 Bipolar disorder, unspecified: Secondary | ICD-10-CM | POA: Diagnosis not present

## 2023-10-15 DIAGNOSIS — Z881 Allergy status to other antibiotic agents status: Secondary | ICD-10-CM | POA: Diagnosis not present

## 2023-10-15 DIAGNOSIS — J449 Chronic obstructive pulmonary disease, unspecified: Secondary | ICD-10-CM | POA: Diagnosis present

## 2023-10-15 DIAGNOSIS — I10 Essential (primary) hypertension: Secondary | ICD-10-CM | POA: Diagnosis present

## 2023-10-15 DIAGNOSIS — Z801 Family history of malignant neoplasm of trachea, bronchus and lung: Secondary | ICD-10-CM

## 2023-10-15 DIAGNOSIS — I219 Acute myocardial infarction, unspecified: Secondary | ICD-10-CM | POA: Diagnosis not present

## 2023-10-15 DIAGNOSIS — I2510 Atherosclerotic heart disease of native coronary artery without angina pectoris: Secondary | ICD-10-CM | POA: Diagnosis not present

## 2023-10-15 DIAGNOSIS — I2111 ST elevation (STEMI) myocardial infarction involving right coronary artery: Secondary | ICD-10-CM | POA: Diagnosis not present

## 2023-10-15 DIAGNOSIS — Z8249 Family history of ischemic heart disease and other diseases of the circulatory system: Secondary | ICD-10-CM | POA: Diagnosis not present

## 2023-10-15 DIAGNOSIS — Z720 Tobacco use: Secondary | ICD-10-CM | POA: Diagnosis present

## 2023-10-15 DIAGNOSIS — I2119 ST elevation (STEMI) myocardial infarction involving other coronary artery of inferior wall: Secondary | ICD-10-CM | POA: Diagnosis present

## 2023-10-15 DIAGNOSIS — Z7982 Long term (current) use of aspirin: Secondary | ICD-10-CM

## 2023-10-15 DIAGNOSIS — R7301 Impaired fasting glucose: Secondary | ICD-10-CM | POA: Diagnosis present

## 2023-10-15 DIAGNOSIS — Z818 Family history of other mental and behavioral disorders: Secondary | ICD-10-CM

## 2023-10-15 DIAGNOSIS — R0789 Other chest pain: Secondary | ICD-10-CM | POA: Diagnosis not present

## 2023-10-15 DIAGNOSIS — F419 Anxiety disorder, unspecified: Secondary | ICD-10-CM | POA: Diagnosis not present

## 2023-10-15 HISTORY — PX: LEFT HEART CATH AND CORONARY ANGIOGRAPHY: CATH118249

## 2023-10-15 HISTORY — PX: CORONARY/GRAFT ACUTE MI REVASCULARIZATION: CATH118305

## 2023-10-15 LAB — BASIC METABOLIC PANEL
Anion gap: 10 (ref 5–15)
BUN: 9 mg/dL (ref 6–20)
CO2: 23 mmol/L (ref 22–32)
Calcium: 9.6 mg/dL (ref 8.9–10.3)
Chloride: 104 mmol/L (ref 98–111)
Creatinine, Ser: 0.81 mg/dL (ref 0.61–1.24)
GFR, Estimated: >60 mL/min (ref >60)
Glucose, Bld: 117 mg/dL — ABNORMAL HIGH (ref 70–99)
Potassium: 3.7 mmol/L (ref 3.5–5.1)
Sodium: 137 mmol/L (ref 135–145)

## 2023-10-15 LAB — CG4 I-STAT (LACTIC ACID): Lactic Acid, Venous: 0.5 mmol/L (ref 0.5–1.9)

## 2023-10-15 LAB — CBC
HCT: 45 % (ref 39.0–52.0)
Hemoglobin: 16 g/dL (ref 13.0–17.0)
MCH: 32.7 pg (ref 26.0–34.0)
MCHC: 35.6 g/dL (ref 30.0–36.0)
MCV: 91.8 fL (ref 80.0–100.0)
Platelets: 291 10*3/uL (ref 150–400)
RBC: 4.9 MIL/uL (ref 4.22–5.81)
RDW: 12.6 % (ref 11.5–15.5)
WBC: 11.2 10*3/uL — ABNORMAL HIGH (ref 4.0–10.5)
nRBC: 0 % (ref 0.0–0.2)

## 2023-10-15 LAB — HEMOGLOBIN A1C
Hgb A1c MFr Bld: 5.5 % (ref 4.8–5.6)
Mean Plasma Glucose: 111.15 mg/dL

## 2023-10-15 LAB — MRSA NEXT GEN BY PCR, NASAL: MRSA by PCR Next Gen: NOT DETECTED

## 2023-10-15 LAB — GLUCOSE, CAPILLARY: Glucose-Capillary: 118 mg/dL — ABNORMAL HIGH (ref 70–99)

## 2023-10-15 LAB — PROTIME-INR
INR: 1 (ref 0.8–1.2)
Prothrombin Time: 13.1 s (ref 11.4–15.2)

## 2023-10-15 LAB — HIV ANTIBODY (ROUTINE TESTING W REFLEX): HIV Screen 4th Generation wRfx: NONREACTIVE

## 2023-10-15 LAB — TROPONIN I (HIGH SENSITIVITY)
Troponin I (High Sensitivity): 2535 ng/L (ref <18)
Troponin I (High Sensitivity): 3780 ng/L (ref <18)

## 2023-10-15 SURGERY — CORONARY/GRAFT ACUTE MI REVASCULARIZATION
Anesthesia: Moderate Sedation

## 2023-10-15 MED ORDER — HEPARIN (PORCINE) IN NACL 1000-0.9 UT/500ML-% IV SOLN
INTRAVENOUS | Status: AC
  Filled 2023-10-15: qty 1000

## 2023-10-15 MED ORDER — VERAPAMIL HCL 2.5 MG/ML IV SOLN
INTRAVENOUS | Status: DC | PRN
  Administered 2023-10-15 (×2): 2.5 mg via INTRAVENOUS

## 2023-10-15 MED ORDER — ASPIRIN 81 MG PO CHEW
81.0000 mg | CHEWABLE_TABLET | Freq: Every day | ORAL | Status: DC
  Administered 2023-10-16: 81 mg via ORAL
  Filled 2023-10-15: qty 1

## 2023-10-15 MED ORDER — LABETALOL HCL 5 MG/ML IV SOLN: 10.0000 mg | INTRAVENOUS | Status: AC | PRN

## 2023-10-15 MED ORDER — PRASUGREL HCL 10 MG PO TABS
ORAL_TABLET | ORAL | Status: AC
  Filled 2023-10-15: qty 6

## 2023-10-15 MED ORDER — ATROPINE SULFATE 1 MG/10ML IJ SOSY
PREFILLED_SYRINGE | INTRAMUSCULAR | Status: AC
  Filled 2023-10-15: qty 10

## 2023-10-15 MED ORDER — FENTANYL CITRATE (PF) 100 MCG/2ML IJ SOLN
INTRAMUSCULAR | Status: DC | PRN
  Administered 2023-10-15: 12.5 ug via INTRAVENOUS
  Administered 2023-10-15 (×2): 25 ug via INTRAVENOUS
  Administered 2023-10-15: 12.5 ug via INTRAVENOUS

## 2023-10-15 MED ORDER — ACETAMINOPHEN 325 MG PO TABS
650.0000 mg | ORAL_TABLET | ORAL | Status: DC | PRN
  Administered 2023-10-15 – 2023-10-16 (×4): 650 mg via ORAL
  Filled 2023-10-15 (×4): qty 2

## 2023-10-15 MED ORDER — NITROGLYCERIN 1 MG/10 ML FOR IR/CATH LAB
INTRA_ARTERIAL | Status: DC | PRN
  Administered 2023-10-15 (×2): 200 ug

## 2023-10-15 MED ORDER — HEPARIN (PORCINE) 25000 UT/250ML-% IV SOLN
1400.0000 [IU]/h | INTRAVENOUS | Status: DC
  Administered 2023-10-15: 1400 [IU]/h via INTRAVENOUS
  Filled 2023-10-15: qty 250

## 2023-10-15 MED ORDER — SODIUM CHLORIDE 0.9 % WEIGHT BASED INFUSION: 1.0000 mL/kg/h | INTRAVENOUS | Status: DC

## 2023-10-15 MED ORDER — LORAZEPAM 1 MG PO TABS
1.0000 mg | ORAL_TABLET | Freq: Two times a day (BID) | ORAL | Status: DC
Start: 2023-10-29 — End: 2023-10-16

## 2023-10-15 MED ORDER — NICOTINE 21 MG/24HR TD PT24
21.0000 mg | MEDICATED_PATCH | Freq: Every day | TRANSDERMAL | Status: DC
  Administered 2023-10-15 – 2023-10-16 (×2): 21 mg via TRANSDERMAL
  Filled 2023-10-15 (×2): qty 1

## 2023-10-15 MED ORDER — SODIUM CHLORIDE 0.9% FLUSH: 3.0000 mL | Freq: Two times a day (BID) | INTRAVENOUS | Status: DC

## 2023-10-15 MED ORDER — NITROGLYCERIN 1 MG/10 ML FOR IR/CATH LAB
INTRA_ARTERIAL | Status: AC
  Filled 2023-10-15: qty 10

## 2023-10-15 MED ORDER — PRASUGREL HCL 10 MG PO TABS
10.0000 mg | ORAL_TABLET | Freq: Every day | ORAL | Status: DC
  Administered 2023-10-16: 10 mg via ORAL
  Filled 2023-10-15: qty 1

## 2023-10-15 MED ORDER — METOPROLOL SUCCINATE ER 50 MG PO TB24
25.0000 mg | ORAL_TABLET | Freq: Every day | ORAL | Status: DC
  Administered 2023-10-16: 25 mg via ORAL
  Filled 2023-10-15: qty 1

## 2023-10-15 MED ORDER — ASPIRIN 81 MG PO CHEW
324.0000 mg | CHEWABLE_TABLET | Freq: Once | ORAL | Status: AC
  Administered 2023-10-15: 324 mg via ORAL
  Filled 2023-10-15: qty 4

## 2023-10-15 MED ORDER — MIDAZOLAM HCL 2 MG/2ML IJ SOLN
INTRAMUSCULAR | Status: DC | PRN
  Administered 2023-10-15: .5 mg via INTRAVENOUS
  Administered 2023-10-15 (×2): 1 mg via INTRAVENOUS
  Administered 2023-10-15: .5 mg via INTRAVENOUS

## 2023-10-15 MED ORDER — PRASUGREL HCL 10 MG PO TABS
ORAL_TABLET | ORAL | Status: DC | PRN
  Administered 2023-10-15: 60 mg via ORAL

## 2023-10-15 MED ORDER — ONDANSETRON HCL 4 MG/2ML IJ SOLN: 4.0000 mg | Freq: Four times a day (QID) | INTRAMUSCULAR | Status: DC | PRN

## 2023-10-15 MED ORDER — VERAPAMIL HCL 2.5 MG/ML IV SOLN
INTRAVENOUS | Status: AC
  Filled 2023-10-15: qty 2

## 2023-10-15 MED ORDER — MIDAZOLAM HCL 2 MG/2ML IJ SOLN
INTRAMUSCULAR | Status: AC
  Filled 2023-10-15: qty 2

## 2023-10-15 MED ORDER — ASPIRIN 81 MG PO TBEC: 81.0000 mg | DELAYED_RELEASE_TABLET | Freq: Every day | ORAL | Status: DC

## 2023-10-15 MED ORDER — SODIUM CHLORIDE 0.9 % IV SOLN: 250.0000 mL | INTRAVENOUS | Status: DC | PRN

## 2023-10-15 MED ORDER — MORPHINE SULFATE (PF) 2 MG/ML IV SOLN: 2.0000 mg | INTRAVENOUS | Status: DC | PRN

## 2023-10-15 MED ORDER — HEPARIN BOLUS VIA INFUSION
4000.0000 [IU] | Freq: Once | INTRAVENOUS | Status: AC
  Administered 2023-10-15: 4000 [IU] via INTRAVENOUS
  Filled 2023-10-15: qty 4000

## 2023-10-15 MED ORDER — FENTANYL CITRATE (PF) 100 MCG/2ML IJ SOLN
INTRAMUSCULAR | Status: AC
  Filled 2023-10-15: qty 2

## 2023-10-15 MED ORDER — LIDOCAINE HCL (PF) 1 % IJ SOLN
INTRAMUSCULAR | Status: DC | PRN
  Administered 2023-10-15: 2 mL

## 2023-10-15 MED ORDER — NITROGLYCERIN 0.4 MG SL SUBL
0.4000 mg | SUBLINGUAL_TABLET | SUBLINGUAL | Status: DC | PRN
  Administered 2023-10-15: 0.4 mg via SUBLINGUAL
  Filled 2023-10-15: qty 1

## 2023-10-15 MED ORDER — HEPARIN (PORCINE) IN NACL 2000-0.9 UNIT/L-% IV SOLN
INTRAVENOUS | Status: DC | PRN
Start: 2023-10-15 — End: 2023-10-15
  Administered 2023-10-15: 1000 mL

## 2023-10-15 MED ORDER — HEPARIN SODIUM (PORCINE) 1000 UNIT/ML IJ SOLN
INTRAMUSCULAR | Status: DC | PRN
  Administered 2023-10-15: 3000 [IU] via INTRAVENOUS
  Administered 2023-10-15: 6000 [IU] via INTRAVENOUS

## 2023-10-15 MED ORDER — SODIUM CHLORIDE 0.9% FLUSH: 3.0000 mL | INTRAVENOUS | Status: DC | PRN

## 2023-10-15 MED ORDER — HEPARIN SODIUM (PORCINE) 1000 UNIT/ML IJ SOLN
INTRAMUSCULAR | Status: AC
  Filled 2023-10-15: qty 10

## 2023-10-15 MED ORDER — BUSPIRONE HCL 15 MG PO TABS
7.5000 mg | ORAL_TABLET | ORAL | Status: DC
  Administered 2023-10-16: 7.5 mg via ORAL
  Filled 2023-10-15: qty 1

## 2023-10-15 MED ORDER — SODIUM CHLORIDE 0.9% FLUSH
3.0000 mL | Freq: Two times a day (BID) | INTRAVENOUS | Status: DC
  Administered 2023-10-15 – 2023-10-16 (×2): 3 mL via INTRAVENOUS

## 2023-10-15 MED ORDER — SODIUM CHLORIDE 0.9 % WEIGHT BASED INFUSION: 3.0000 mL/kg/h | INTRAVENOUS | Status: AC

## 2023-10-15 MED ORDER — SODIUM CHLORIDE 0.9 % WEIGHT BASED INFUSION
1.0000 mL/kg/h | INTRAVENOUS | Status: AC
  Administered 2023-10-15: 1 mL/kg/h via INTRAVENOUS

## 2023-10-15 MED ORDER — HYDRALAZINE HCL 20 MG/ML IJ SOLN: 10.0000 mg | INTRAMUSCULAR | Status: AC | PRN

## 2023-10-15 MED ORDER — NITROGLYCERIN 2 % TD OINT
1.0000 [in_us] | TOPICAL_OINTMENT | Freq: Four times a day (QID) | TRANSDERMAL | Status: DC
  Administered 2023-10-15 (×2): 1 [in_us] via TOPICAL
  Filled 2023-10-15 (×2): qty 1

## 2023-10-15 MED ORDER — IOHEXOL 300 MG/ML  SOLN
INTRAMUSCULAR | Status: DC | PRN
  Administered 2023-10-15: 175 mL

## 2023-10-15 MED ORDER — BUSPIRONE HCL 10 MG PO TABS
15.0000 mg | ORAL_TABLET | Freq: Every day | ORAL | Status: DC
  Administered 2023-10-15: 15 mg via ORAL
  Filled 2023-10-15: qty 2

## 2023-10-15 MED ORDER — ATORVASTATIN CALCIUM 80 MG PO TABS
80.0000 mg | ORAL_TABLET | Freq: Every day | ORAL | Status: DC
  Administered 2023-10-15 – 2023-10-16 (×2): 80 mg via ORAL
  Filled 2023-10-15: qty 1
  Filled 2023-10-15: qty 4

## 2023-10-15 SURGICAL SUPPLY — 20 items
BALLN TREK RX 2.25X15 (BALLOONS) ×1 IMPLANT
BALLN ~~LOC~~ TREK NEO RX 3.5X20 (BALLOONS) ×1 IMPLANT
BALLOON TREK RX 2.25X15 (BALLOONS) IMPLANT
BALLOON ~~LOC~~ TREK NEO RX 3.5X20 (BALLOONS) IMPLANT
CATH INFINITI 5 FR JL3.5 (CATHETERS) IMPLANT
CATH INFINITI 5FR ANG PIGTAIL (CATHETERS) IMPLANT
CATH VISTA GUIDE 6FR JR4 ECOPK (CATHETERS) IMPLANT
DEVICE RAD TR BAND REGULAR (VASCULAR PRODUCTS) IMPLANT
DRAPE BRACHIAL (DRAPES) IMPLANT
GLIDESHEATH SLEND SS 6F .021 (SHEATH) IMPLANT
GUIDEWIRE INQWIRE 1.5J.035X260 (WIRE) IMPLANT
INQWIRE 1.5J .035X260CM (WIRE) ×1 IMPLANT
KIT ENCORE 26 ADVANTAGE (KITS) IMPLANT
PACK CARDIAC CATH (CUSTOM PROCEDURE TRAY) ×1 IMPLANT
PROTECTION STATION PRESSURIZED (MISCELLANEOUS) ×1 IMPLANT
SET ATX-X65L (MISCELLANEOUS) IMPLANT
STATION PROTECTION PRESSURIZED (MISCELLANEOUS) IMPLANT
STENT ONYX FRONTIER 3.0X30 (Permanent Stent) IMPLANT
TUBING CIL FLEX 10 FLL-RA (TUBING) IMPLANT
WIRE ASAHI PROWATER 180CM (WIRE) IMPLANT

## 2023-10-15 NOTE — Progress Notes (Signed)
 PHARMACY - ANTICOAGULATION CONSULT NOTE  Pharmacy Consult for heparin Indication: chest pain/ACS  Allergies  Allergen Reactions   Ciprofloxacin     Acute kidney failure   Patient Measurements: Height: 6\' 5"  (195.6 cm) Weight: 108.9 kg (240 lb) IBW/kg (Calculated) : 89.1 Heparin Dosing Weight: 108.9 kg  Vital Signs: Temp: 98.2 F (36.8 C) (02/26 1529) Temp Source: Oral (02/26 1529) BP: 156/104 (02/26 1529) Pulse Rate: 94 (02/26 1529)  Labs: Recent Labs    10/15/23 1529  HGB 16.0  HCT 45.0  PLT 291  CREATININE 0.81  TROPONINIHS 2,535*   Estimated Creatinine Clearance: 146.4 mL/min (by C-G formula based on SCr of 0.81 mg/dL).  Medical History: Past Medical History:  Diagnosis Date   Acute kidney failure (HCC) 01/2012   Anxiety    Bipolar disorder (HCC)    hypomania   COPD (chronic obstructive pulmonary disease) (HCC)    Depression    Low back pain    Assessment: 53 y/o male presenting with bilateral arm pain, found to have elevated troponin levels in ED. Pharmacy has been consulted to initiate heparin infusion. Per chart review, patient is not on anticoagulation prior to admission.  Baseline labs: hgb 16, plt 291, INR pending  Goal of Therapy:  Heparin level 0.3-0.7 units/ml Monitor platelets by anticoagulation protocol: Yes   Plan:  Give 4000 units bolus x 1 Start heparin infusion at 1400 units/hr Check anti-Xa level in 6 hours and daily while on heparin Continue to monitor H&H and platelets  Thank you for involving pharmacy in this patient's care.   Rockwell Alexandria, PharmD Clinical Pharmacist 10/15/2023 4:36 PM

## 2023-10-15 NOTE — ED Notes (Addendum)
 Critical Result: Trop Magdalene Patricia, MD made aware  Patient taken to room 9. Primary RN Abby made aware of patient and that monitor was not currently in room so this RN could not hook up patient at this time.

## 2023-10-15 NOTE — Assessment & Plan Note (Signed)
 Nicotine patch

## 2023-10-15 NOTE — H&P (Signed)
 History and Physical    Patient: Matthew [name] SWF:[id number] DOB: 08-Nov-1970 DOA: 10/15/2023 DOS: the patient was seen and examined on 10/15/2023 PCP: Pcp, No  Patient coming from: Home  Chief Complaint:  Chief Complaint  Patient presents with   Chest Pain   HPI: [name] is a 53 y.o. male with medical history significant of anxiety, depression, bipolar disorder.  He presents to the hospital with chest pains on and off since yesterday morning at 10 AM.  Pressure center of his chest radiating to bilateral forearms and 1 time across his back.  Pains on and off all day yesterday.  He was diaphoretic yesterday.  No shortness of breath.  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  Last night pains got pretty bad.  He took an aspirin and Ativan and hydroxyzine to see if it would get better.  This morning not as bad and he went to work and then it got worse and decided to come in for further evaluation.  In the ER, troponin was found to be elevated at 2535.  Patient was started on heparin drip and given aspirin.  Beta-blocker and Lipitor ordered.  Patient still having some chest pain and nitroglycerin and nitro patch ordered. Review of Systems: Review of Systems  Constitutional:  Positive for diaphoresis. Negative for chills, fever and malaise/fatigue.  HENT:  Negative for sore throat.   Eyes:  Negative for blurred vision.  Respiratory:  Negative for shortness of breath.   Cardiovascular:  Positive for chest pain.  Gastrointestinal:  Positive for heartburn. Negative for abdominal pain, constipation, diarrhea, nausea and vomiting.  Genitourinary:  Negative for dysuria.  Musculoskeletal:  Positive for back pain.  Skin:  Negative for rash.  Neurological:  Negative for dizziness.  Endo/Heme/Allergies:  Does not bruise/bleed easily.  Psychiatric/Behavioral:  Positive for depression. The patient is nervous/anxious.      Past Medical History:  Diagnosis Date   Acute kidney failure (HCC) 01/2012   Anxiety    Bipolar disorder (HCC)    hypomania   COPD (chronic obstructive pulmonary disease) (HCC)    Depression    Low back pain    Past Surgical History:  Procedure Laterality Date   NO PAST SURGERIES     Social History:  reports that he has been smoking cigarettes. He has a 40 pack-year smoking history. He has never used smokeless tobacco. He reports current alcohol use of about 3.0 standard drinks of alcohol per week. He reports current drug use. Drug: Marijuana.  Allergies  Allergen Reactions   Ciprofloxacin     Acute kidney failure    Family History  Problem Relation Age of Onset   Anxiety disorder Mother    Alcohol abuse Father    Drug abuse Father    Bipolar disorder Sister    Alcohol abuse Brother    Drug abuse Brother    Bipolar disorder Paternal Aunt    Schizophrenia Paternal Aunt    Hypertension Maternal Grandfather    Depression Maternal Grandmother    Cancer Maternal Grandmother        lung   Hypertension Paternal Grandfather    Cancer Paternal Grandmother        lung   Diabetes Neg Hx    Heart disease Neg Hx    Hyperlipidemia Neg Hx    Stroke Neg Hx    Suicidality Neg Hx  Prior to Admission medications   Medication Sig Start Date End Date Taking? Authorizing Provider  baclofen (LIORESAL) 10 MG tablet Take 1 tablet (10 mg total) by mouth 3 (three) times daily as needed for muscle spasms. 03/09/19   Sheliah Hatch, MD  busPIRone (BUSPAR) 15 MG tablet Take 1/2 tablet (7.5 mg) by mouth daily AND 1 tablet (15 mg total) at bedtime. 09/08/23 12/07/23  Neysa Hotter, MD  cyclobenzaprine (FLEXERIL) 10 MG tablet Take 1 tablet (10 mg total) by mouth 3 (three) times daily as needed. 06/26/12   Lorre Munroe, NP  LORazepam (ATIVAN) 1 MG tablet May take 1 tablet (1 mg total) by mouth 2 (two) times daily as needed for anxiety. May also take 1 tablet (1 mg total) every other day as  needed for anxiety. 10/29/23 12/28/23  Neysa Hotter, MD  tadalafil (CIALIS) 10 MG tablet Take 1 tablet (10 mg total) by mouth daily as needed for erectile dysfunction. 04/25/22   [name], MD  valACYclovir (VALTREX) 1000 MG tablet Take 2 tablets (2,000 mg total) by mouth 2 (two) times daily. 12/18/20   Sheliah Hatch, MD    Physical Exam: Vitals:   10/15/23 1529 10/15/23 1650 10/15/23 1750 10/15/23 1755  BP: (!) 156/104 (!) 147/95 (!) 153/96 (!) 143/90  Pulse: 94 93 96 (!) 101  Resp: 16 15 (!) 25 (!) 24  Temp: 98.2 F (36.8 C)     TempSrc: Oral     SpO2: 98% 98% 99% 96%  Weight:      Height:       Physical Exam HENT:     Head: Normocephalic.     Mouth/Throat:     Pharynx: No oropharyngeal exudate.  Eyes:     General: Lids are normal.     Conjunctiva/sclera: Conjunctivae normal.  Cardiovascular:     Rate and Rhythm: Normal rate and regular rhythm.     Heart sounds: Normal heart sounds, S1 normal and S2 normal.  Pulmonary:     Breath sounds: No decreased breath sounds, wheezing, rhonchi or rales.  Musculoskeletal:     Right lower leg: No swelling.     Left lower leg: No swelling.  Skin:    General: Skin is warm.     Findings: No rash.  Neurological:     Mental Status: He is alert and oriented to person, place, and time.     Data Reviewed: Lab is 11.2, hemoglobin 16, platelet count 291, troponin 2535, creatinine 0.81 Reviewed numerous EKGs which will fall pretty similar.  Normal sinus rhythm 97 bpm slight ST elevation in lead III but not seen in 2 or aVF.  Another EKG similar at normal sinus rhythm 94 bpm, slight ST elevation in lead III but not seen in 2 or aVF. Assessment and Plan: * NSTEMI (non-ST elevated myocardial infarction) (HCC) Aspirin given in the emergency room will continue 81 mg daily.  Patient was started on heparin drip.  Will give metoprolol and Lipitor.  Start nitroglycerin sublingually and Nitropaste.  If no response with chest pain will have to  start nitro glycerin drip.  As needed morphine.  Cardiology plans on cardiac cath tomorrow morning.  Accelerated hypertension Could be secondary to pain.  Beta-blocker started will give nitroglycerin.  Anxiety and depression Patient on BuSpar and Ativan.  Tobacco abuse Nicotine patch  Impaired fasting glucose Add on a hemoglobin A1c      Advance Care Planning:   Code Status: Full Code   Consults: cardiology  Family Communication: wife at bedside  Severity of Illness: The appropriate patient status for this patient is INPATIENT. Inpatient status is judged to be reasonable and necessary in order to provide the required intensity of service to ensure the patient's safety. The patient's presenting symptoms, physical exam findings, and initial radiographic and laboratory data in the context of their chronic comorbidities is felt to place them at high risk for further clinical deterioration. Furthermore, it is not anticipated that the patient will be medically stable for discharge from the hospital within 2 midnights of admission.   * I certify that at the point of admission it is my clinical judgment that the patient will require inpatient hospital care spanning beyond 2 midnights from the point of admission due to high intensity of service, high risk for further deterioration and high frequency of surveillance required.*  Author: Alford Highland, MD 10/15/2023 6:01 PM  For on call review [email or web address].

## 2023-10-15 NOTE — Assessment & Plan Note (Signed)
 Patient is not a diabetic.  Hemoglobin A1c 5.5 ?

## 2023-10-15 NOTE — Assessment & Plan Note (Signed)
 Patient on BuSpar and Ativan.

## 2023-10-15 NOTE — Progress Notes (Signed)
 [facility] [address] [place], [place] [address] Phone: [phone] Fax: [phone]   Office Visit Note  Patient Name: Matthew [name]  Date of [id number]  Med Rec number [id number]  Date of Service: 10/15/2023  Ciprofloxacin  Chief Complaint  Patient presents with   Chest Pain    Intermittent chest pain radiating down the back of both arms since yesterday. No SOB. No known injury. He has been taking ibuprofen which has not been very helpful.    HPI Patient is an 53 y.o. student here for complaints of bilateral arm pain that started yesterday at 10 am. Feeling some chest pain that he described as achy that radiates down both arms into hands. Reports pain across upper back and into shoulders. 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 Has hx of anxiety and anxiety attacks but typically episodes last about 30 minutes or so. Has taken ibuprofen last about 1 pm, tylenol and ativan. No SOB. Reports lifting some boxes yesterday but no injury that he is aware of.  Denies nausea or vomiting.  Had 1 episode of diaphoresis.  Blood pressure appears elevated.  He is currently not on any medications.  He is a current everyday smoker.   Current Medication:  Outpatient Encounter Medications as of 10/15/2023  Medication Sig Note   baclofen (LIORESAL) 10 MG tablet Take 1 tablet (10 mg total) by mouth 3 (three) times daily as needed for muscle spasms. 07/22/2019: Rarely takes it    busPIRone (BUSPAR) 15 MG tablet Take 1/2 tablet (7.5 mg) by mouth daily AND 1 tablet (15 mg total) at bedtime.    cyclobenzaprine (FLEXERIL) 10 MG tablet Take 1 tablet (10 mg total) by mouth 3 (three) times daily as needed. 01/29/2018: As needed   [START ON 10/29/2023] LORazepam (ATIVAN) 1 MG tablet May take 1 tablet (1 mg total) by mouth 2 (two) times daily as needed for anxiety. May also take 1 tablet (1 mg total) every other day as needed for anxiety.    tadalafil (CIALIS) 10 MG tablet Take 1 tablet (10 mg  total) by mouth daily as needed for erectile dysfunction.    valACYclovir (VALTREX) 1000 MG tablet Take 2 tablets (2,000 mg total) by mouth 2 (two) times daily.    No facility-administered encounter medications on file as of 10/15/2023.      Medical History: Past Medical History:  Diagnosis Date   Acute kidney failure (HCC) 01/2012   Anxiety    Bipolar disorder (HCC)    hypomania   COPD (chronic obstructive pulmonary disease) (HCC)    Depression    Low back pain      Vital Signs: BP (!) 158/102   Pulse 92   Temp 98.1 F (36.7 C)   Ht 6\' 5"  (1.956 m)   Wt 258 lb (117 kg)   SpO2 97%   BMI 30.59 kg/m   ROS: As per HPI.  All other pertinent ROS negative.     Review of Systems  Respiratory:  Positive for chest tightness. Negative for shortness of breath.   Cardiovascular:  Positive for chest pain.  Musculoskeletal:  Positive for arthralgias.  Neurological:  Negative for dizziness, syncope and headaches.  Psychiatric/Behavioral:  The patient is nervous/anxious.     Physical Exam Vitals reviewed.  Constitutional:      Appearance: He is well-developed.  Cardiovascular:     Rate and Rhythm: Normal rate and regular rhythm.     Pulses: Normal pulses.     Heart sounds:  Normal heart sounds.  Pulmonary:     Effort: Pulmonary effort is normal.     Breath sounds: Normal breath sounds.  Musculoskeletal:       Arms:     Right lower leg: No edema.     Left lower leg: No edema.     Comments: Nonspecific pain to shoulders amd down bilateral arms into hands. No pinpoint tenderness.   Substernal chest pain. No jaw pain.   Neurological:     Mental Status: He is alert.    No results found for this or any previous visit (from the past 24 hours).  Assessment/Plan: 1. Chest pain, unspecified type (Primary) Patient is a 53 year old male current everyday smoker who presents with sternal chest pain that radiates to his upper back and shoulders and down his arms.  No previous  history of cardiac concerns.  Currently not on any cardiac medications.  Takes several medications for anxiety and bipolar disorder.  He has taken Tylenol 1000 mg, ibuprofen 800 mg and Ativan 1 mg today.  We discussed cardiac exam and EKG which showed normal sinus rhythm with prolonged QT interval.  Compared EKG to previous from November 2024 which showed normal sinus rhythm HR 84.  Reports during exam, worsening substernal chest pain.  BP is elevated (158/102) and HR 92.  We discussed continuing to monitor versus ruling out MI or something more serious at [facility] ED.  Given worsening of his chest pain, recommended evaluation in the emergency room.  Called ahead and made triage nurse at ED aware.  Patient felt comfortable driving himself to the emergency room.  Offered to get him a ride.  General Counseling: jamieon lannen understanding of the findings of todays visit and agrees with plan of treatment. I have discussed any further diagnostic evaluation that may be needed or ordered today. We also reviewed his medications today. he has been encouraged to call the office with any questions or concerns that should arise related to todays visit.   No orders of the defined types were placed in this encounter.   No orders of the defined types were placed in this encounter.   I spent 25 minutes dedicated to the care of this patient (face-to-face and non-face-to-face) on the date of the encounter to include what is described in the assessment and plan.   Durenda Hurt, NP 10/15/2023 3:27 PM

## 2023-10-15 NOTE — Assessment & Plan Note (Signed)
 Patient initially given aspirin and heparin drip.  Beta-blocker and Lipitor started.  Patient had worsening pain and brought to the Cath Lab and a stent was placed in the RCA.  Diagnosed with STEMI instead of NSTEMI.  Upon discharge patient will be on aspirin, Effient, Toprol, low-dose lisinopril and Lipitor.  Follow-up with cardiology 1 week.  Out of work until seen by cardiology.  Cardiac rehab referral.

## 2023-10-15 NOTE — ED Notes (Signed)
 [facility] called for Code Stemmi , talked with [place]

## 2023-10-15 NOTE — ED Triage Notes (Signed)
 Pt sts that he has been having chest pain since yesterday. Pt sts that he went to the [place] [facility] and they advised him to come and be seen.

## 2023-10-15 NOTE — Progress Notes (Signed)
   10/15/23 1715  Spiritual Encounters  Type of Visit Initial  Care provided to: Pt and family  Conversation partners present during encounter Physician  Reason for visit Code  OnCall Visit Yes   Chaplain responded to a CODE STEMI in the ED.  Patient was being moved to the Cath Lab and Chaplain checked on patient's family during the procedure to offer a compassionate presence, empathy and reflective listening.  Patient moved to ICU.    Rev. Rana M. Earlene Plater, MDiv Chaplain Resident [facility]

## 2023-10-15 NOTE — ED Notes (Signed)
 Pt reported increased chest pain, hospitalist informed. EKG uploaded, see orders.

## 2023-10-15 NOTE — Assessment & Plan Note (Signed)
 Could be secondary to pain.  Patient on low-dose beta-blocker and low-dose lisinopril.

## 2023-10-15 NOTE — ED Provider Notes (Signed)
 [facility] Provider Note   Event Date/Time   First MD Initiated Contact with Patient 10/15/23 1623     (approximate) History  Chest Pain  HPI Matthew [name] is a 53 y.o. male with a stated past medical history of anxiety presents for intermittent substernal chest pain with radiation through to the back and to bilateral arms over the course of last night into today.  Patient endorses worsening shortness of breath with exertion. ROS: Patient currently denies any vision changes, tinnitus, difficulty speaking, facial droop, sore throat, abdominal pain, nausea/vomiting/diarrhea, dysuria, or weakness/numbness/paresthesias in any extremity   Physical Exam  Triage Vital Signs: ED Triage Vitals  Encounter Vitals Group     BP 10/15/23 1529 (!) 156/104     Systolic BP Percentile --      Diastolic BP Percentile --      Pulse Rate 10/15/23 1529 94     Resp 10/15/23 1529 16     Temp 10/15/23 1529 98.2 F (36.8 C)     Temp Source 10/15/23 1529 Oral     SpO2 10/15/23 1529 98 %     Weight 10/15/23 1527 240 lb (108.9 kg)     Height 10/15/23 1527 6\' 5"  (1.956 m)     Head Circumference --      Peak Flow --      Pain Score 10/15/23 1527 5     Pain Loc --      Pain Education --      Exclude from Growth Chart --    Most recent vital signs: Vitals:   10/15/23 1529  BP: (!) 156/104  Pulse: 94  Resp: 16  Temp: 98.2 F (36.8 C)  SpO2: 98%   General: Awake, oriented x4. CV:  Good peripheral perfusion.  Resp:  Normal effort.  Abd:  No distention.  Other:  Middle-aged overweight Caucasian male resting comfortably in no acute distress ED Results / Procedures / Treatments  Labs (all labs ordered are listed, but only abnormal results are displayed) Labs Reviewed  BASIC METABOLIC PANEL - Abnormal; Notable for the following components:      Result Value   Glucose, Bld 117 (*)    All other components within normal limits  CBC - Abnormal; Notable for the  following components:   WBC 11.2 (*)    All other components within normal limits  TROPONIN I (HIGH SENSITIVITY) - Abnormal; Notable for the following components:   Troponin I (High Sensitivity) 2,535 (*)    All other components within normal limits  HEPARIN LEVEL (UNFRACTIONATED)  PROTIME-INR   EKG ED ECG REPORT I, Merwyn Katos, the attending physician, personally viewed and interpreted this ECG. Date: 10/15/2023 EKG Time: 1530 Rate: 96 Rhythm: normal sinus rhythm QRS Axis: normal Intervals: normal ST/T Wave abnormalities: 1 mm ST elevation in lead [name], ST depression in aVL Narrative Interpretation: Normal sinus rhythm with 1 mm ST elevation in lead [name] and ST depression in aVL RADIOLOGY ED MD interpretation: 2 view chest x-ray interpreted by me shows no evidence of acute abnormalities including no pneumonia, pneumothorax, or widened mediastinum -Agree with radiology assessment Official radiology report(s): DG Chest 2 View Result Date: 10/15/2023 CLINICAL DATA:  Chest pain EXAM: CHEST - 2 VIEW COMPARISON:  08/31/2021 FINDINGS: The heart size and mediastinal contours are within normal limits. Both lungs are clear. The visualized skeletal structures are unremarkable. IMPRESSION: No active cardiopulmonary disease. Electronically Signed   By: Judie Petit.  Shick M.D.   On: 10/15/2023  15:53   PROCEDURES: Critical Care performed: Yes, see critical care procedure note(s) .1-3 Lead EKG Interpretation  Performed by: Merwyn Katos, MD Authorized by: Merwyn Katos, MD     Interpretation: normal     ECG rate:  91   ECG rate assessment: normal     Rhythm: sinus rhythm     Ectopy: none     Conduction: normal   CRITICAL CARE Performed by: Merwyn Katos  Total critical care time: 31 minutes  Critical care time was exclusive of separately billable procedures and treating other patients.  Critical care was necessary to treat or prevent imminent or life-threatening deterioration.  Critical  care was time spent personally by me on the following activities: development of treatment plan with patient and/or surrogate as well as nursing, discussions with consultants, evaluation of patient's response to treatment, examination of patient, obtaining history from patient or surrogate, ordering and performing treatments and interventions, ordering and review of laboratory studies, ordering and review of radiographic studies, pulse oximetry and re-evaluation of patient's condition.  MEDICATIONS ORDERED IN ED: Medications  heparin ADULT infusion 100 units/mL (25000 units/238mL) (1,400 Units/hr Intravenous New Bag/Given 10/15/23 1657)  aspirin chewable tablet 324 mg (324 mg Oral Given 10/15/23 1654)  heparin bolus via infusion 4,000 Units (4,000 Units Intravenous Bolus from Bag 10/15/23 1657)   IMPRESSION / MDM / ASSESSMENT AND PLAN / ED COURSE  I reviewed the triage vital signs and the nursing notes.                             The patient is on the cardiac monitor to evaluate for evidence of arrhythmia and/or significant heart rate changes. Patient's presentation is most consistent with acute presentation with potential threat to life or bodily function. Workup: ECG, CXR, CBC, CMP, Troponin Findings: ECG: No overt evidence of STEMI. No evidence of Brugada's sign, delta wave, epsilon wave, significantly prolonged QTc, or malignant arrhythmia Troponin: 2535 Other Labs unremarkable for emergent problems. CXR: Without PTX, PNA, or widened mediastinum Last Stress Test: Never Last Heart Catheterization: Never HEART Score: 5  Given History, Exam, and Workup concern for NSTEMI.  I have low suspicion for Pneumothorax, Pneumonia, Pulmonary Embolus, Tamponade, Aortic Dissection  Interventions: ASA 324or325mg  Heparin Bolus 60-70u/kg (max 5000) Heparin gtt about 12-15u/kg/hr (max 1000/hr) PRN analgesia with fentanyl, morphine PRN antiemetic therapy  Dispo: Admit   FINAL CLINICAL IMPRESSION(S)  / ED DIAGNOSES   Final diagnoses:  NSTEMI (non-ST elevated myocardial infarction) (HCC)  Chest pain due to myocardial ischemia, unspecified ischemic chest pain type   Rx / DC Orders   ED Discharge Orders     None      Note:  This document was prepared using Dragon voice recognition software and may include unintentional dictation errors.   Merwyn Katos, MD 10/15/23 [phone]

## 2023-10-15 NOTE — Consult Note (Signed)
 [facility] Cardiology  CARDIOLOGY CONSULT NOTE  Patient ID: Matthew [name] MRN: [id number] DOB/AGE: 1971-03-29 53 y.o.  Admit date: 10/15/2023 Referring Physician [facility] Primary Physician  Primary Cardiologist  Reason for Consultation NSTEMI  HPI: 53 year old gentleman referred for chest pain, elevated troponin, consistent with NSTEMI.  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  The patient reports symptoms persisted throughout the day and night, rated as 5 out of 10, as high as 8 out of 10.  He presented to [facility] ED at 2:30 PM 10/15/2023.  ECG revealed sinus rhythm with nondiagnostic ST elevation in lead [name] only, without reciprocal changes.  Admission labs revealed elevated troponin 2535 consistent with late presentation.  Patient was started on heparin drip with overall clinical improvement, and less chest discomfort.  Review of systems complete and found to be negative unless listed above     Past Medical History:  Diagnosis Date   Acute kidney failure (HCC) 01/2012   Anxiety    Bipolar disorder (HCC)    hypomania   COPD (chronic obstructive pulmonary disease) (HCC)    Depression    Low back pain     Past Surgical History:  Procedure Laterality Date   NO PAST SURGERIES      (Not in a hospital admission)  Social History   Socioeconomic History   Marital status: Married    Spouse name: Not on file   Number of children: Not on file   Years of education: Not on file   Highest education level: Not on file  Occupational History   Not on file  Tobacco Use   Smoking status: Every Day    Current packs/day: 2.00    Average packs/day: 2.0 packs/day for 20.0 years (40.0 ttl pk-yrs)    Types: Cigarettes   Smokeless tobacco: Never  Vaping Use   Vaping status: Every Day   Substances: CBD  Substance and Sexual Activity   Alcohol use: Yes    Alcohol/week: 3.0 standard  drinks of alcohol    Types: 3 Shots of liquor per week   Drug use: Yes    Types: Marijuana    Comment: daily- quit 1 month ago   Sexual activity: Yes    Partners: Female    Birth control/protection: None  Other Topics Concern   Not on file  Social History Narrative   Not on file   Social Drivers of Health   Financial Resource Strain: Not on file  Food Insecurity: Not on file  Transportation Needs: Not on file  Physical Activity: Not on file  Stress: Not on file  Social Connections: Not on file  Intimate Partner Violence: Not on file    Family History  Problem Relation Age of Onset   Anxiety disorder Mother    Alcohol abuse Father    Drug abuse Father    Bipolar disorder Sister    Alcohol abuse Brother    Drug abuse Brother    Bipolar disorder Paternal Aunt    Schizophrenia Paternal Aunt    Hypertension Maternal Grandfather    Depression Maternal Grandmother    Cancer Maternal Grandmother        lung   Hypertension Paternal Grandfather    Cancer Paternal Grandmother        lung   Diabetes Neg Hx    Heart disease Neg Hx    Hyperlipidemia Neg Hx    Stroke Neg Hx  Suicidality Neg Hx       Review of systems complete and found to be negative unless listed above      PHYSICAL EXAM  General: Well developed, well nourished, in no acute distress HEENT:  Normocephalic and atramatic Neck:  No JVD.  Lungs: Clear bilaterally to auscultation and percussion. Heart: HRRR . Normal S1 and S2 without gallops or murmurs.  Abdomen: Bowel sounds are positive, abdomen soft and non-tender  Msk:  Back normal, normal gait. Normal strength and tone for age. Extremities: No clubbing, cyanosis or edema.   Neuro: Alert and oriented X 3. Psych:  Good affect, responds appropriately  Labs:   Lab Results  Component Value Date   WBC 11.2 (H) 10/15/2023   HGB 16.0 10/15/2023   HCT 45.0 10/15/2023   MCV 91.8 10/15/2023   PLT 291 10/15/2023    Recent Labs  Lab 10/15/23 1529   NA 137  K 3.7  CL 104  CO2 23  BUN 9  CREATININE 0.81  CALCIUM 9.6  GLUCOSE 117*   No results found for: "CKTOTAL", "CKMB", "CKMBINDEX", "TROPONINI"  Lab Results  Component Value Date   CHOL 161 03/09/2019   CHOL 155 03/12/2018   CHOL 141 04/24/2017   Lab Results  Component Value Date   HDL 30.60 (L) 03/09/2019   HDL 26 (L) 03/12/2018   HDL 27 (L) 04/24/2017   Lab Results  Component Value Date   LDLCALC 96 03/09/2019   LDLCALC 57 03/12/2018   LDLCALC 50 04/24/2017   Lab Results  Component Value Date   TRIG 174.0 (H) 03/09/2019   TRIG 360 (H) 03/12/2018   TRIG 319 (H) 04/24/2017   Lab Results  Component Value Date   CHOLHDL 5 03/09/2019   CHOLHDL 5 01/21/2011   No results found for: "LDLDIRECT"    Radiology: DG Chest 2 View Result Date: 10/15/2023 CLINICAL DATA:  Chest pain EXAM: CHEST - 2 VIEW COMPARISON:  08/31/2021 FINDINGS: The heart size and mediastinal contours are within normal limits. Both lungs are clear. The visualized skeletal structures are unremarkable. IMPRESSION: No active cardiopulmonary disease. Electronically Signed   By: Judie Petit.  Shick M.D.   On: 10/15/2023 15:53    EKG: Sinus rhythm, nondiagnostic ST elevation in lead [name] only, no reciprocal changes  ASSESSMENT AND PLAN:   1.  NSTEMI, with new onset chest pain lasting 30 hours, with nondiagnostic ECG for STEMI, with 1 mm ST elevation in lead [name] only, with elevated troponin consistent with late presentation, clinical improvement after starting heparin infusion 2.  Essential hypertension.  The patient reports elevated blood pressures for the past 6 months, untreated. 3.  Tobacco abuse, patient reports smoking a pack of cigarettes per day  Recommendations  1.  Continue heparin drip 2.  Start metoprolol succinate 25 mg daily 3.  Start atorvastatin 80 mg daily 4.  2D echocardiogram 5.  Proceed with cardiac catheterization with selective coronary angiography, and possible percutaneous coronary  invention, scheduled for 10/16/2023.  The risk, benefits alternatives of cardiac catheterization and PCI were explained to the patient and informed consent was obtained.  Signed: Marcina Millard MD,PhD, [facility] 10/15/2023, 5:20 PM

## 2023-10-16 ENCOUNTER — Other Ambulatory Visit: Payer: Self-pay

## 2023-10-16 ENCOUNTER — Inpatient Hospital Stay
Admit: 2023-10-16 | Discharge: 2023-10-16 | Disposition: A | Discharge status: Home / Self Care | Payer: BC Managed Care – PPO | Attending: Student | Admitting: Student

## 2023-10-16 ENCOUNTER — Encounter: Payer: Self-pay | Admitting: Cardiology

## 2023-10-16 ENCOUNTER — Ambulatory Visit: Payer: Self-pay | Admitting: Licensed Clinical Social Worker

## 2023-10-16 DIAGNOSIS — I10 Essential (primary) hypertension: Secondary | ICD-10-CM | POA: Diagnosis not present

## 2023-10-16 DIAGNOSIS — I219 Acute myocardial infarction, unspecified: Secondary | ICD-10-CM | POA: Diagnosis not present

## 2023-10-16 DIAGNOSIS — I2111 ST elevation (STEMI) myocardial infarction involving right coronary artery: Secondary | ICD-10-CM

## 2023-10-16 DIAGNOSIS — Z720 Tobacco use: Secondary | ICD-10-CM | POA: Diagnosis not present

## 2023-10-16 DIAGNOSIS — F419 Anxiety disorder, unspecified: Secondary | ICD-10-CM | POA: Diagnosis not present

## 2023-10-16 LAB — LIPID PANEL
Cholesterol: 145 mg/dL (ref 0–200)
HDL: 22 mg/dL — ABNORMAL LOW (ref >40)
LDL Cholesterol: 74 mg/dL (ref 0–99)
Total CHOL/HDL Ratio: 6.6 {ratio}
Triglycerides: 244 mg/dL — ABNORMAL HIGH (ref <150)
VLDL: 49 mg/dL — ABNORMAL HIGH (ref 0–40)

## 2023-10-16 LAB — BASIC METABOLIC PANEL
Anion gap: 9 (ref 5–15)
BUN: 11 mg/dL (ref 6–20)
CO2: 21 mmol/L — ABNORMAL LOW (ref 22–32)
Calcium: 8.7 mg/dL — ABNORMAL LOW (ref 8.9–10.3)
Chloride: 107 mmol/L (ref 98–111)
Creatinine, Ser: 0.76 mg/dL (ref 0.61–1.24)
GFR, Estimated: >60 mL/min (ref >60)
Glucose, Bld: 125 mg/dL — ABNORMAL HIGH (ref 70–99)
Potassium: 3.4 mmol/L — ABNORMAL LOW (ref 3.5–5.1)
Sodium: 137 mmol/L (ref 135–145)

## 2023-10-16 LAB — CBC
HCT: 37.9 % — ABNORMAL LOW (ref 39.0–52.0)
Hemoglobin: 13.8 g/dL (ref 13.0–17.0)
MCH: 33.1 pg (ref 26.0–34.0)
MCHC: 36.4 g/dL — ABNORMAL HIGH (ref 30.0–36.0)
MCV: 90.9 fL (ref 80.0–100.0)
Platelets: 219 10*3/uL (ref 150–400)
RBC: 4.17 MIL/uL — ABNORMAL LOW (ref 4.22–5.81)
RDW: 12.8 % (ref 11.5–15.5)
WBC: 7.2 10*3/uL (ref 4.0–10.5)
nRBC: 0 % (ref 0.0–0.2)

## 2023-10-16 SURGERY — LEFT HEART CATH
Anesthesia: Moderate Sedation

## 2023-10-16 MED ORDER — METOPROLOL SUCCINATE ER 25 MG PO TB24: 25.0000 mg | ORAL_TABLET | Freq: Every day | ORAL | 0 refills | Status: DC

## 2023-10-16 MED ORDER — LORAZEPAM 1 MG PO TABS
1.0000 mg | ORAL_TABLET | Freq: Two times a day (BID) | ORAL | Status: DC
  Administered 2023-10-16: 1 mg via ORAL
  Filled 2023-10-16: qty 1

## 2023-10-16 MED ORDER — LISINOPRIL 2.5 MG PO TABS: 2.5000 mg | ORAL_TABLET | Freq: Every day | ORAL | 0 refills | Status: DC

## 2023-10-16 MED ORDER — ASPIRIN 81 MG PO CHEW: 81.0000 mg | CHEWABLE_TABLET | ORAL | Status: DC

## 2023-10-16 MED ORDER — CHLORHEXIDINE GLUCONATE CLOTH 2 % EX PADS
6.0000 | MEDICATED_PAD | Freq: Every day | CUTANEOUS | Status: DC
  Administered 2023-10-16: 6 via TOPICAL

## 2023-10-16 MED ORDER — PRASUGREL HCL 10 MG PO TABS: 10.0000 mg | ORAL_TABLET | Freq: Every day | ORAL | 0 refills | Status: DC

## 2023-10-16 MED ORDER — LISINOPRIL 5 MG PO TABS: 2.5000 mg | ORAL_TABLET | Freq: Every day | ORAL | Status: DC

## 2023-10-16 MED ORDER — ATORVASTATIN CALCIUM 80 MG PO TABS: 80.0000 mg | ORAL_TABLET | Freq: Every day | ORAL | 0 refills | Status: DC

## 2023-10-16 MED ORDER — NICOTINE 21 MG/24HR TD PT24: MEDICATED_PATCH | TRANSDERMAL | 0 refills | Status: DC

## 2023-10-16 MED ORDER — ASPIRIN 81 MG PO CHEW: 81.0000 mg | CHEWABLE_TABLET | Freq: Every day | ORAL | 0 refills | Status: DC

## 2023-10-16 NOTE — Plan of Care (Signed)
  Problem: Clinical Measurements: Goal: Respiratory complications will improve Outcome: Progressing   Problem: Activity: Goal: Risk for activity intolerance will decrease Outcome: Progressing   Problem: Nutrition: Goal: Adequate nutrition will be maintained Outcome: Progressing   Problem: Coping: Goal: Level of anxiety will decrease Outcome: Progressing   Problem: Elimination: Goal: Will not experience complications related to urinary retention Outcome: Progressing   Problem: Pain Managment: Goal: General experience of comfort will improve and/or be controlled Outcome: Progressing   Problem: Safety: Goal: Ability to remain free from injury will improve Outcome: Progressing   Problem: Education: Goal: Understanding of CV disease, CV risk reduction, and recovery process will improve Outcome: Progressing Goal: Individualized Educational Video(s) Outcome: Progressing   Problem: Activity: Goal: Ability to return to baseline activity level will improve Outcome: Progressing   Problem: Cardiovascular: Goal: Ability to achieve and maintain adequate cardiovascular perfusion will improve Outcome: Progressing Goal: Vascular access site(s) Level 0-1 will be maintained Outcome: Progressing   Problem: Education: Goal: Understanding of CV disease, CV risk reduction, and recovery process will improve Outcome: Progressing Goal: Individualized Educational Video(s) Outcome: Progressing   Problem: Activity: Goal: Ability to return to baseline activity level will improve Outcome: Progressing   Problem: Cardiovascular: Goal: Ability to achieve and maintain adequate cardiovascular perfusion will improve Outcome: Progressing Goal: Vascular access site(s) Level 0-1 will be maintained Outcome: Progressing

## 2023-10-16 NOTE — Discharge Summary (Signed)
 Physician Discharge Summary   Patient: Matthew [name] MRN: [id number] DOB: 10-31-1970  Admit date:     10/15/2023  Discharge date: 10/16/23  Discharge Physician: Alford Highland   PCP: Pcp, No   Recommendations at discharge:   Re-establish with your medical doctor Dr. Darrold Junker 1 week  Discharge Diagnoses: Principal Problem:   STEMI involving right coronary artery [facility]) Active Problems:   Accelerated hypertension   Anxiety and depression   Tobacco abuse   Impaired fasting glucose    Hospital Course: 53 year old man past medical history of anxiety, depression and bipolar disorder.  He presented to the hospital with chest pains on and off since a day prior to admission in the center of his chest radiating to bilateral arms and one-time across his back.  Pains were on and off all day.  He was diaphoretic.  Not short of breath.  Overnight he had severe pain.  Not as bad in the morning so he went to work and then got worse there so he came into the ER for further evaluation.  Initially believed to be NSTEMI but patient was brought to the cardiac Cath Lab and diagnosed with STEMI with 1 vessel occlusion.  Stent was placed in the RCA.  2/27.  Patient ambulated well.  Echocardiogram pending read.  Patient to remain out of work until seen by cardiology as outpatient.  Cardiac rehab referral.  Assessment and Plan: * STEMI involving right coronary artery [facility]) Patient initially given aspirin and heparin drip.  Beta-blocker and Lipitor started.  Patient had worsening pain and brought to the Cath Lab and a stent was placed in the RCA.  Diagnosed with STEMI instead of NSTEMI.  Upon discharge patient will be on aspirin, Effient, Toprol, low-dose lisinopril and Lipitor.  Follow-up with cardiology 1 week.  Out of work until seen by cardiology.  Cardiac rehab referral.  Accelerated hypertension Could be secondary to pain.  Patient on low-dose beta-blocker and low-dose lisinopril.  Anxiety  and depression Patient on BuSpar and Ativan.  Tobacco abuse Nicotine patch  Impaired fasting glucose Patient is not a diabetic.  Hemoglobin A1c 5.5.         Consultants: Cardiology Procedures performed: Cardiac catheterization and stent to the RCA Disposition: Home Diet recommendation:  Cardiac diet DISCHARGE MEDICATION: Allergies as of 10/16/2023       Reactions   Ciprofloxacin    Acute kidney failure        Medication List     STOP taking these medications    baclofen 10 MG tablet Commonly known as: LIORESAL   cyclobenzaprine 10 MG tablet Commonly known as: FLEXERIL   tadalafil 10 MG tablet Commonly known as: CIALIS   valACYclovir 1000 MG tablet Commonly known as: VALTREX       TAKE these medications    aspirin 81 MG chewable tablet Chew 1 tablet (81 mg total) by mouth daily. Start taking on: October 17, 2023   atorvastatin 80 MG tablet Commonly known as: LIPITOR Take 1 tablet (80 mg total) by mouth daily. Start taking on: October 17, 2023   busPIRone 15 MG tablet Commonly known as: BUSPAR Take 1/2 tablet (7.5 mg) by mouth daily AND 1 tablet (15 mg total) at bedtime.   lisinopril 2.5 MG tablet Commonly known as: ZESTRIL Take 1 tablet (2.5 mg total) by mouth at bedtime.   LORazepam 1 MG tablet Commonly known as: Ativan May take 1 tablet (1 mg total) by mouth 2 (two) times daily as needed for  anxiety. May also take 1 tablet (1 mg total) every other day as needed for anxiety. Start taking on: October 29, 2023   metoprolol succinate 25 MG 24 hr tablet Commonly known as: TOPROL-XL Take 1 tablet (25 mg total) by mouth daily. Take with or immediately following a meal. Start taking on: October 17, 2023   nicotine 21 mg/24hr patch Commonly known as: NICODERM CQ - dosed in mg/24 hours One 21mg  patch chest wall daily (okay to substitute generic) Start taking on: October 17, 2023   prasugrel 10 MG Tabs tablet Commonly known as: EFFIENT Take  1 tablet (10 mg total) by mouth daily. Start taking on: October 17, 2023        Follow-up Information     Paraschos, Lyn Hollingshead, MD. Go in 1 week(s).   Specialty: Cardiology Contact information: [address] [facility] [place] [address] [phone]         your medical doctor Follow up in 5 day(s).                 Discharge Exam: Filed Weights   10/15/23 1527  Weight: 108.9 kg   Physical Exam HENT:     Head: Normocephalic.  Eyes:     General: Lids are normal.     Conjunctiva/sclera: Conjunctivae normal.  Cardiovascular:     Rate and Rhythm: Normal rate and regular rhythm.     Heart sounds: Normal heart sounds, S1 normal and S2 normal.  Pulmonary:     Breath sounds: No decreased breath sounds, wheezing, rhonchi or rales.  Abdominal:     Palpations: Abdomen is soft.     Tenderness: There is no abdominal tenderness.  Musculoskeletal:     Right lower leg: No swelling.     Left lower leg: No swelling.  Skin:    General: Skin is warm.     Findings: No rash.  Neurological:     Mental Status: He is alert and oriented to person, place, and time.      Condition at discharge: stable  The results of significant diagnostics from this hospitalization (including imaging, microbiology, ancillary and laboratory) are listed below for reference.   Imaging Studies: CARDIAC CATHETERIZATION Result Date: 10/15/2023   Prox RCA to Mid RCA lesion is 100% stenosed.   A drug-eluting stent was successfully placed using a STENT ONYX FRONTIER 3.0X30.   Post intervention, there is a 0% residual stenosis.   There is mild left ventricular systolic dysfunction.   LV end diastolic pressure is normal.   The left ventricular ejection fraction is 45-50% by visual estimate. 1.  Inferior STEMI 2.  Mildly reduced left ventricular function with estimated LV ejection fraction 45-50% 3.  Successful primary PCI with 3.0 x 30 mm Onyx Frontier drug-eluting stent  proximal RCA Recommendations 1.  Dual antiplatelet therapy uninterrupted x 1 year 2.  Start metoprolol succinate 25 mg daily 3.  Start atorvastatin 80 mg daily 4.  2D echocardiogram 5.  Strongly advised patient to stop smoking   DG Chest 2 View Result Date: 10/15/2023 CLINICAL DATA:  Chest pain EXAM: CHEST - 2 VIEW COMPARISON:  08/31/2021 FINDINGS: The heart size and mediastinal contours are within normal limits. Both lungs are clear. The visualized skeletal structures are unremarkable. IMPRESSION: No active cardiopulmonary disease. Electronically Signed   By: Judie Petit.  Shick M.D.   On: 10/15/2023 15:53    Microbiology: Results for orders placed or performed during the hospital encounter of 10/15/23  MRSA Next Gen by PCR,  Nasal     Status: None   Collection Time: 10/15/23  8:04 PM   Specimen: Nasal Mucosa; Nasal Swab  Result Value Ref Range Status   MRSA by PCR Next Gen NOT DETECTED NOT DETECTED Final    Comment: (NOTE) The GeneXpert MRSA Assay (FDA approved for NASAL specimens only), is one component of a comprehensive MRSA colonization surveillance program. It is not intended to diagnose MRSA infection nor to guide or monitor treatment for MRSA infections. Test performance is not FDA approved in patients less than 62 years old. Performed at [facility], [address] [place], [place] [address]     Labs: CBC: Recent Labs  Lab 10/15/23 1529 10/16/23 0419  WBC 11.2* 7.2  HGB 16.0 13.8  HCT 45.0 37.9*  MCV 91.8 90.9  PLT 291 219   Basic Metabolic Panel: Recent Labs  Lab 10/15/23 1529 10/16/23 0419  NA 137 137  K 3.7 3.4*  CL 104 107  CO2 23 21*  GLUCOSE 117* 125*  BUN 9 11  CREATININE 0.81 0.76  CALCIUM 9.6 8.7*   Liver Function Tests: No results for input(s): "AST", "ALT", "ALKPHOS", "BILITOT", "PROT", "ALBUMIN" in the last 168 hours. CBG: Recent Labs  Lab 10/15/23 1952  GLUCAP 118*    Discharge time spent: greater than 30 minutes.  Signed: Alford Highland, MD [facility] 10/16/2023

## 2023-10-16 NOTE — Progress Notes (Signed)
 Pt discharged to home via personal vehicle with wife. Discharge instructions discussed with pt, and pt's wife prior to discharge. Pt aware of new medications, and necessary appointments to make. All questions answered.

## 2023-10-16 NOTE — Progress Notes (Signed)
 [facility] Cardiology  SUBJECTIVE: Patient laying in bed, denies chest pain or wrist pain   Vitals:   10/16/23 0400 10/16/23 0500 10/16/23 0600 10/16/23 0700  BP: 103/85 (!) 111/94 95/82 111/87  Pulse: 89 75 93 70  Resp: 15 16 13 13   Temp: 98.2 F (36.8 C)     TempSrc: Axillary     SpO2: 98% 96% 98% 96%  Weight:      Height:         Intake/Output Summary (Last 24 hours) at 10/16/2023 0759 Last data filed at 10/16/2023 0600 Gross per 24 hour  Intake 1679.34 ml  Output 1750 ml  Net -70.66 ml      PHYSICAL EXAM  General: Well developed, well nourished, in no acute distress HEENT:  Normocephalic and atramatic Neck:  No JVD.  Lungs: Clear bilaterally to auscultation and percussion. Heart: HRRR . Normal S1 and S2 without gallops or murmurs.  Abdomen: Bowel sounds are positive, abdomen soft and non-tender  Msk:  Back normal, normal gait. Normal strength and tone for age. Extremities: No clubbing, cyanosis or edema.   Neuro: Alert and oriented X 3. Psych:  Good affect, responds appropriately   LABS: Basic Metabolic Panel: Recent Labs    10/15/23 1529 10/16/23 0419  NA 137 137  K 3.7 3.4*  CL 104 107  CO2 23 21*  GLUCOSE 117* 125*  BUN 9 11  CREATININE 0.81 0.76  CALCIUM 9.6 8.7*   Liver Function Tests: No results for input(s): "AST", "ALT", "ALKPHOS", "BILITOT", "PROT", "ALBUMIN" in the last 72 hours. No results for input(s): "LIPASE", "AMYLASE" in the last 72 hours. CBC: Recent Labs    10/15/23 1529 10/16/23 0419  WBC 11.2* 7.2  HGB 16.0 13.8  HCT 45.0 37.9*  MCV 91.8 90.9  PLT 291 219   Cardiac Enzymes: No results for input(s): "CKTOTAL", "CKMB", "CKMBINDEX", "TROPONINI" in the last 72 hours. BNP: Invalid input(s): "POCBNP" D-Dimer: No results for input(s): "DDIMER" in the last 72 hours. Hemoglobin A1C: Recent Labs    10/15/23 1529  HGBA1C 5.5   Fasting Lipid Panel: Recent Labs    10/16/23 0419  CHOL 145  HDL 22*  LDLCALC 74  TRIG 096*   CHOLHDL 6.6   Thyroid Function Tests: No results for input(s): "TSH", "T4TOTAL", "T3FREE", "THYROIDAB" in the last 72 hours.  Invalid input(s): "FREET3" Anemia Panel: No results for input(s): "VITAMINB12", "FOLATE", "FERRITIN", "TIBC", "IRON", "RETICCTPCT" in the last 72 hours.  CARDIAC CATHETERIZATION Result Date: 10/15/2023   Prox RCA to Mid RCA lesion is 100% stenosed.   A drug-eluting stent was successfully placed using a STENT ONYX FRONTIER 3.0X30.   Post intervention, there is a 0% residual stenosis.   There is mild left ventricular systolic dysfunction.   LV end diastolic pressure is normal.   The left ventricular ejection fraction is 45-50% by visual estimate. 1.  Inferior STEMI 2.  Mildly reduced left ventricular function with estimated LV ejection fraction 45-50% 3.  Successful primary PCI with 3.0 x 30 mm Onyx Frontier drug-eluting stent proximal RCA Recommendations 1.  Dual antiplatelet therapy uninterrupted x 1 year 2.  Start metoprolol succinate 25 mg daily 3.  Start atorvastatin 80 mg daily 4.  2D echocardiogram 5.  Strongly advised patient to stop smoking   DG Chest 2 View Result Date: 10/15/2023 CLINICAL DATA:  Chest pain EXAM: CHEST - 2 VIEW COMPARISON:  08/31/2021 FINDINGS: The heart size and mediastinal contours are within normal limits. Both lungs are clear. The visualized skeletal  structures are unremarkable. IMPRESSION: No active cardiopulmonary disease. Electronically Signed   By: Judie Petit.  Shick M.D.   On: 10/15/2023 15:53     Echo pending  TELEMETRY: Sinus rhythm:  ASSESSMENT AND PLAN:  Principal Problem:   NSTEMI (non-ST elevated myocardial infarction) (HCC) Active Problems:   Tobacco abuse   Anxiety and depression   Accelerated hypertension   Impaired fasting glucose    1.  Inferior STEMI, cardiac catheterization revealed occluded proximal RCA, underwent successful primary PCI with DES proximal RCA, with resolution of chest pain 2.  Essential hypertension.   The patient reports elevated blood pressures for the past 6 months, untreated. 3.  Tobacco abuse, patient reports smoking a pack of cigarettes per day   Recommendations   1.  Dual antiplatelet therapy uninterrupted x 1 year 2.  Continue metoprolol succinate 25 mg daily 3.  Continue atorvastatin 80 mg daily 4.  Start low-dose ACE/ARB as blood pressure permits 5.  Review 2D echocardiogram 6.  Fast-track discharge (less than 24 hours) later today   Matthew Millard, MD, PhD, [facility] 10/16/2023 7:59 AM

## 2023-10-16 NOTE — Hospital Course (Signed)
 53 year old man past medical history of anxiety, depression and bipolar disorder.  He presented to the hospital with chest pains on and off since a day prior to admission in the center of his chest radiating to bilateral arms and one-time across his back.  Pains were on and off all day.  He was diaphoretic.  Not short of breath.  Overnight he had severe pain.  Not as bad in the morning so he went to work and then got worse there so he came into the ER for further evaluation.  Initially believed to be NSTEMI but patient was brought to the cardiac Cath Lab and diagnosed with STEMI with 1 vessel occlusion.  Stent was placed in the RCA.  2/27.  Patient ambulated well.  Echocardiogram pending read.  Patient to remain out of work until seen by cardiology as outpatient.  Cardiac rehab referral.

## 2023-10-16 NOTE — Plan of Care (Signed)
  Problem: Education: Goal: Understanding of cardiac disease, CV risk reduction, and recovery process will improve Outcome: Progressing   Problem: Activity: Goal: Ability to tolerate increased activity will improve Outcome: Progressing   Problem: Cardiac: Goal: Ability to achieve and maintain adequate cardiovascular perfusion will improve Outcome: Progressing   Problem: Health Behavior/Discharge Planning: Goal: Ability to safely manage health-related needs after discharge will improve Outcome: Progressing   Problem: Clinical Measurements: Goal: Respiratory complications will improve Outcome: Progressing   Problem: Activity: Goal: Risk for activity intolerance will decrease Outcome: Progressing   Problem: Nutrition: Goal: Adequate nutrition will be maintained Outcome: Progressing   Problem: Coping: Goal: Level of anxiety will decrease Outcome: Progressing   Problem: Elimination: Goal: Will not experience complications related to urinary retention Outcome: Progressing   Problem: Pain Managment: Goal: General experience of comfort will improve and/or be controlled Outcome: Progressing   Problem: Safety: Goal: Ability to remain free from injury will improve Outcome: Progressing   Problem: Education: Goal: Understanding of CV disease, CV risk reduction, and recovery process will improve Outcome: Progressing Goal: Individualized Educational Video(s) Outcome: Progressing   Problem: Activity: Goal: Ability to return to baseline activity level will improve Outcome: Progressing   Problem: Cardiovascular: Goal: Ability to achieve and maintain adequate cardiovascular perfusion will improve Outcome: Progressing Goal: Vascular access site(s) Level 0-1 will be maintained Outcome: Progressing   Problem: Education: Goal: Understanding of CV disease, CV risk reduction, and recovery process will improve Outcome: Progressing Goal: Individualized Educational  Video(s) Outcome: Progressing   Problem: Activity: Goal: Ability to return to baseline activity level will improve Outcome: Progressing   Problem: Cardiovascular: Goal: Ability to achieve and maintain adequate cardiovascular perfusion will improve Outcome: Progressing Goal: Vascular access site(s) Level 0-1 will be maintained Outcome: Progressing

## 2023-10-16 NOTE — Discharge Instructions (Signed)

## 2023-10-17 LAB — POCT ACTIVATED CLOTTING TIME: Activated Clotting Time: 320 s

## 2023-10-17 LAB — ECHOCARDIOGRAM COMPLETE
AR max vel: 2.49 cm2
AV Area VTI: 3.16 cm2
AV Area mean vel: 2.58 cm2
AV Mean grad: 3 mm[Hg]
AV Peak grad: 5 mm[Hg]
Ao pk vel: 1.12 m/s
Area-P 1/2: 5.42 cm2
Height: 77 in
MV VTI: 4.54 cm2
S' Lateral: 3.4 cm
Weight: 3840 [oz_av]

## 2023-10-18 ENCOUNTER — Emergency Department

## 2023-10-18 ENCOUNTER — Observation Stay
Admission: EM | Admit: 2023-10-18 | Discharge: 2023-10-19 | Disposition: A | Discharge status: Home / Self Care | Attending: Family Medicine | Admitting: Family Medicine

## 2023-10-18 ENCOUNTER — Other Ambulatory Visit: Payer: Self-pay

## 2023-10-18 DIAGNOSIS — Z79899 Other long term (current) drug therapy: Secondary | ICD-10-CM | POA: Insufficient documentation

## 2023-10-18 DIAGNOSIS — Z720 Tobacco use: Secondary | ICD-10-CM | POA: Diagnosis present

## 2023-10-18 DIAGNOSIS — R7301 Impaired fasting glucose: Secondary | ICD-10-CM | POA: Diagnosis not present

## 2023-10-18 DIAGNOSIS — I2510 Atherosclerotic heart disease of native coronary artery without angina pectoris: Principal | ICD-10-CM

## 2023-10-18 DIAGNOSIS — Z7982 Long term (current) use of aspirin: Secondary | ICD-10-CM | POA: Insufficient documentation

## 2023-10-18 DIAGNOSIS — Z955 Presence of coronary angioplasty implant and graft: Secondary | ICD-10-CM | POA: Diagnosis not present

## 2023-10-18 DIAGNOSIS — F17210 Nicotine dependence, cigarettes, uncomplicated: Secondary | ICD-10-CM | POA: Insufficient documentation

## 2023-10-18 DIAGNOSIS — F32A Depression, unspecified: Secondary | ICD-10-CM | POA: Diagnosis present

## 2023-10-18 DIAGNOSIS — I214 Non-ST elevation (NSTEMI) myocardial infarction: Secondary | ICD-10-CM | POA: Diagnosis not present

## 2023-10-18 DIAGNOSIS — R0789 Other chest pain: Secondary | ICD-10-CM | POA: Diagnosis not present

## 2023-10-18 DIAGNOSIS — I4719 Other supraventricular tachycardia: Secondary | ICD-10-CM

## 2023-10-18 DIAGNOSIS — J4489 Other specified chronic obstructive pulmonary disease: Secondary | ICD-10-CM | POA: Insufficient documentation

## 2023-10-18 DIAGNOSIS — I10 Essential (primary) hypertension: Secondary | ICD-10-CM | POA: Diagnosis not present

## 2023-10-18 DIAGNOSIS — J45909 Unspecified asthma, uncomplicated: Secondary | ICD-10-CM | POA: Diagnosis present

## 2023-10-18 DIAGNOSIS — R079 Chest pain, unspecified: Secondary | ICD-10-CM | POA: Diagnosis not present

## 2023-10-18 DIAGNOSIS — F419 Anxiety disorder, unspecified: Secondary | ICD-10-CM | POA: Diagnosis not present

## 2023-10-18 DIAGNOSIS — J449 Chronic obstructive pulmonary disease, unspecified: Secondary | ICD-10-CM | POA: Diagnosis not present

## 2023-10-18 LAB — CBC
HCT: 40.9 % (ref 39.0–52.0)
Hemoglobin: 14.5 g/dL (ref 13.0–17.0)
MCH: 32.4 pg (ref 26.0–34.0)
MCHC: 35.5 g/dL (ref 30.0–36.0)
MCV: 91.3 fL (ref 80.0–100.0)
Platelets: 241 10*3/uL (ref 150–400)
RBC: 4.48 MIL/uL (ref 4.22–5.81)
RDW: 12.6 % (ref 11.5–15.5)
WBC: 10.6 10*3/uL — ABNORMAL HIGH (ref 4.0–10.5)
nRBC: 0 % (ref 0.0–0.2)

## 2023-10-18 LAB — PROTIME-INR
INR: 1 (ref 0.8–1.2)
Prothrombin Time: 13.1 s (ref 11.4–15.2)

## 2023-10-18 LAB — COMPREHENSIVE METABOLIC PANEL
ALT: 49 U/L — ABNORMAL HIGH (ref 0–44)
AST: 40 U/L (ref 15–41)
Albumin: 4.2 g/dL (ref 3.5–5.0)
Alkaline Phosphatase: 84 U/L (ref 38–126)
Anion gap: 6 (ref 5–15)
BUN: 14 mg/dL (ref 6–20)
CO2: 25 mmol/L (ref 22–32)
Calcium: 9.3 mg/dL (ref 8.9–10.3)
Chloride: 108 mmol/L (ref 98–111)
Creatinine, Ser: 0.81 mg/dL (ref 0.61–1.24)
GFR, Estimated: >60 mL/min (ref >60)
Glucose, Bld: 113 mg/dL — ABNORMAL HIGH (ref 70–99)
Potassium: 3.9 mmol/L (ref 3.5–5.1)
Sodium: 139 mmol/L (ref 135–145)
Total Bilirubin: 1.2 mg/dL (ref 0.0–1.2)
Total Protein: 7.2 g/dL (ref 6.5–8.1)

## 2023-10-18 LAB — HEPARIN LEVEL (UNFRACTIONATED)
Heparin Unfractionated: <0.1 [IU]/mL — ABNORMAL LOW (ref 0.30–0.70)
Heparin Unfractionated: 0.35 [IU]/mL (ref 0.30–0.70)

## 2023-10-18 LAB — TROPONIN I (HIGH SENSITIVITY)
Troponin I (High Sensitivity): 5619 ng/L (ref <18)
Troponin I (High Sensitivity): 5338 ng/L (ref <18)
Troponin I (High Sensitivity): 4970 ng/L (ref <18)
Troponin I (High Sensitivity): 4846 ng/L (ref <18)
Troponin I (High Sensitivity): 4695 ng/L (ref <18)
Troponin I (High Sensitivity): 5312 ng/L (ref <18)

## 2023-10-18 LAB — APTT: aPTT: 33 s (ref 24–36)

## 2023-10-18 LAB — LIPASE, BLOOD: Lipase: 44 U/L (ref 11–51)

## 2023-10-18 LAB — LIPOPROTEIN A (LPA): Lipoprotein (a): 59.3 nmol/L — ABNORMAL HIGH (ref <75.0)

## 2023-10-18 MED ORDER — ONDANSETRON HCL 4 MG/2ML IJ SOLN: 4.0000 mg | Freq: Four times a day (QID) | INTRAMUSCULAR | Status: DC | PRN

## 2023-10-18 MED ORDER — HEPARIN (PORCINE) 25000 UT/250ML-% IV SOLN
1500.0000 [IU]/h | INTRAVENOUS | Status: DC
  Administered 2023-10-18: 1500 [IU]/h via INTRAVENOUS
  Filled 2023-10-18: qty 250

## 2023-10-18 MED ORDER — ATORVASTATIN CALCIUM 20 MG PO TABS
80.0000 mg | ORAL_TABLET | Freq: Every day | ORAL | Status: DC
  Filled 2023-10-18 (×3): qty 4

## 2023-10-18 MED ORDER — ONDANSETRON HCL 4 MG/2ML IJ SOLN
4.0000 mg | Freq: Once | INTRAMUSCULAR | Status: AC
  Administered 2023-10-18: 4 mg via INTRAVENOUS
  Filled 2023-10-18: qty 2

## 2023-10-18 MED ORDER — NITROGLYCERIN 0.4 MG SL SUBL
0.4000 mg | SUBLINGUAL_TABLET | SUBLINGUAL | Status: DC | PRN
  Administered 2023-10-18: 0.4 mg via SUBLINGUAL
  Filled 2023-10-18: qty 1

## 2023-10-18 MED ORDER — HEPARIN BOLUS VIA INFUSION
4000.0000 [IU] | Freq: Once | INTRAVENOUS | Status: AC
  Administered 2023-10-18: 4000 [IU] via INTRAVENOUS
  Filled 2023-10-18: qty 4000

## 2023-10-18 MED ORDER — PRASUGREL HCL 10 MG PO TABS
10.0000 mg | ORAL_TABLET | Freq: Every day | ORAL | Status: DC
  Filled 2023-10-18: qty 1

## 2023-10-18 MED ORDER — METOPROLOL SUCCINATE ER 25 MG PO TB24
12.5000 mg | ORAL_TABLET | Freq: Every day | ORAL | Status: DC
  Filled 2023-10-18: qty 1

## 2023-10-18 MED ORDER — ASPIRIN 81 MG PO TBEC: 81.0000 mg | DELAYED_RELEASE_TABLET | Freq: Every day | ORAL | Status: DC

## 2023-10-18 MED ORDER — LISINOPRIL 5 MG PO TABS
2.5000 mg | ORAL_TABLET | Freq: Every day | ORAL | Status: DC
  Administered 2023-10-18: 2.5 mg via ORAL
  Filled 2023-10-18: qty 1

## 2023-10-18 MED ORDER — ACETAMINOPHEN 325 MG PO TABS: 650.0000 mg | ORAL_TABLET | ORAL | Status: DC | PRN

## 2023-10-18 MED ORDER — MORPHINE SULFATE (PF) 4 MG/ML IV SOLN
4.0000 mg | Freq: Once | INTRAVENOUS | Status: AC
  Administered 2023-10-18: 4 mg via INTRAVENOUS
  Filled 2023-10-18: qty 1

## 2023-10-18 MED ORDER — LORAZEPAM 1 MG PO TABS
1.0000 mg | ORAL_TABLET | Freq: Four times a day (QID) | ORAL | Status: DC | PRN
  Administered 2023-10-18: 1 mg via ORAL
  Filled 2023-10-18: qty 1

## 2023-10-18 MED ORDER — MORPHINE SULFATE (PF) 4 MG/ML IV SOLN
4.0000 mg | INTRAVENOUS | Status: DC | PRN
  Administered 2023-10-18: 4 mg via INTRAVENOUS
  Filled 2023-10-18: qty 1

## 2023-10-18 MED ORDER — BUSPIRONE HCL 5 MG PO TABS
10.0000 mg | ORAL_TABLET | Freq: Two times a day (BID) | ORAL | Status: DC
  Administered 2023-10-18: 10 mg via ORAL
  Filled 2023-10-18 (×2): qty 2

## 2023-10-18 MED ORDER — ASPIRIN 81 MG PO CHEW
81.0000 mg | CHEWABLE_TABLET | Freq: Every day | ORAL | Status: DC
  Administered 2023-10-18: 81 mg via ORAL
  Filled 2023-10-18 (×2): qty 1

## 2023-10-18 MED ORDER — METOPROLOL SUCCINATE ER 50 MG PO TB24: 25.0000 mg | ORAL_TABLET | Freq: Every day | ORAL | Status: DC

## 2023-10-18 NOTE — Assessment & Plan Note (Signed)
No signs of acute exacerbation. Continue bronchodilator therapy.

## 2023-10-18 NOTE — H&P (Signed)
 History and Physical    Patient: Matthew [name] ZOX:[id number] DOB: [birth year] DOA: 10/18/2023 DOS: the patient was seen and examined on 10/18/2023 PCP: Pcp, No  Patient coming from: Home  Chief Complaint:  Chief Complaint  Patient presents with   Chest Pain   HPI: Matthew [name] is a 53 y.o. male with medical history significant of obesity, COPD, anxiety depression, STEMI status post stent placement February 26 presenting with NSTEMI and chest pain.  Patient noted to have been admitted February 26 2 fibber 27th for acute STEMI.  Underwent cardiac catheterization which showed acute blockage in the RCA requiring DES placement.  Was discharged on regimen including aspirin, Effient, Toprol, lisinopril and Lipitor.  Patient reports compliance with regimen.  Has had recurrent central chest pain with radiation to the right arm as well as chest pressure over the past 1 to 2 days.  Mild diaphoresis.  No shortness of breath.  No abdominal pain.  No diarrhea.  No focal hemiparesis or confusion.  No reported active tobacco use per report. Presented to the ER afebrile, hemodynamically stable.  White count 10.6, hemoglobin 14.5, platelets 241, troponin 5620.  Creatinine 0.81.  EKG sinus rhythm.  Chest x-ray within normal limits.  Dr. Darrold Junker with cardiology formally evaluated at the bedside. Review of Systems: As mentioned in the history of present illness. All other systems reviewed and are negative. Past Medical History:  Diagnosis Date   Acute kidney failure (HCC) 01/2012   Anxiety    Bipolar disorder (HCC)    hypomania   COPD (chronic obstructive pulmonary disease) (HCC)    Depression    Low back pain    Past Surgical History:  Procedure Laterality Date   CORONARY/GRAFT ACUTE MI REVASCULARIZATION N/A 10/15/2023   Procedure: Coronary/Graft Acute MI Revascularization;  Surgeon: Marcina Millard, MD;  Location: [facility] INVASIVE CV LAB;  Service: Cardiovascular;  Laterality: N/A;    LEFT HEART CATH AND CORONARY ANGIOGRAPHY N/A 10/15/2023   Procedure: LEFT HEART CATH AND CORONARY ANGIOGRAPHY;  Surgeon: Marcina Millard, MD;  Location: [facility] INVASIVE CV LAB;  Service: Cardiovascular;  Laterality: N/A;   NO PAST SURGERIES     Social History:  reports that he has been smoking cigarettes. He has a 40 pack-year smoking history. He has never used smokeless tobacco. He reports current alcohol use of about 3.0 standard drinks of alcohol per week. He reports current drug use. Drug: Marijuana.  Allergies  Allergen Reactions   Ciprofloxacin     Acute kidney failure    Family History  Problem Relation Age of Onset   Anxiety disorder Mother    Alcohol abuse Father    Drug abuse Father    Bipolar disorder Sister    Alcohol abuse Brother    Drug abuse Brother    Bipolar disorder Paternal Aunt    Schizophrenia Paternal Aunt    Hypertension Maternal Grandfather    Depression Maternal Grandmother    Cancer Maternal Grandmother        lung   Hypertension Paternal Grandfather    Cancer Paternal Grandmother        lung   Diabetes Neg Hx    Heart disease Neg Hx    Hyperlipidemia Neg Hx    Stroke Neg Hx    Suicidality Neg Hx     Prior to Admission medications   Medication Sig Start Date End Date Taking? Authorizing Provider  aspirin 81 MG chewable tablet Chew 1 tablet (81 mg total) by mouth daily. 10/17/23  Alford Highland, MD  atorvastatin (LIPITOR) 80 MG tablet Take 1 tablet (80 mg total) by mouth daily. 10/17/23   Alford Highland, MD  busPIRone (BUSPAR) 15 MG tablet Take 1/2 tablet (7.5 mg) by mouth daily AND 1 tablet (15 mg total) at bedtime. 09/08/23 12/07/23  Neysa Hotter, MD  lisinopril (ZESTRIL) 2.5 MG tablet Take 1 tablet (2.5 mg total) by mouth at bedtime. 10/16/23   Alford Highland, MD  LORazepam (ATIVAN) 1 MG tablet May take 1 tablet (1 mg total) by mouth 2 (two) times daily as needed for anxiety. May also take 1 tablet (1 mg total) every other day as needed for  anxiety. 10/29/23 12/28/23  Neysa Hotter, MD  metoprolol succinate (TOPROL-XL) 25 MG 24 hr tablet Take 1 tablet (25 mg total) by mouth daily. Take with or immediately following a meal. 10/17/23   Alford Highland, MD  nicotine (NICODERM CQ - DOSED IN MG/24 HOURS) 21 mg/24hr patch One 21mg  patch chest wall daily (okay to substitute generic) 10/17/23   Alford Highland, MD  prasugrel (EFFIENT) 10 MG TABS tablet Take 1 tablet (10 mg total) by mouth daily. 10/17/23   Alford Highland, MD    Physical Exam: Vitals:   10/18/23 0981 10/18/23 1914 10/18/23 0836 10/18/23 0843  BP: 116/72  112/78 107/77  Pulse: 67  63 79  Resp: 18  16 14   Temp: 97.9 F (36.6 C)     TempSrc: Oral     SpO2: 99%  100% 97%  Weight:  108.9 kg    Height:  6\' 5"  (1.956 m)     Physical Exam Constitutional:      Appearance: He is normal weight.  HENT:     Head: Normocephalic and atraumatic.     Nose: Nose normal.  Eyes:     Pupils: Pupils are equal, round, and reactive to light.  Cardiovascular:     Rate and Rhythm: Normal rate and regular rhythm.  Pulmonary:     Effort: Pulmonary effort is normal.  Abdominal:     General: Bowel sounds are normal.  Musculoskeletal:        General: Normal range of motion.  Skin:    General: Skin is warm.  Neurological:     General: No focal deficit present.  Psychiatric:        Mood and Affect: Mood normal.     Data Reviewed:  There are no new results to review at this time.  DG Chest Port 1 View CLINICAL DATA:  Chest pain.  EXAM: PORTABLE CHEST 1 VIEW  COMPARISON:  Chest radiograph dated 10/15/2023.  FINDINGS: The heart size and mediastinal contours are within normal limits. Both lungs are clear. The visualized skeletal structures are unremarkable.  IMPRESSION: No active disease.  Electronically Signed   By: Romona Curls M.D.   On: 10/18/2023 09:52  Lab Results  Component Value Date   WBC 10.6 (H) 10/18/2023   HGB 14.5 10/18/2023   HCT 40.9 10/18/2023    MCV 91.3 10/18/2023   PLT 241 10/18/2023   Last metabolic panel Lab Results  Component Value Date   GLUCOSE 113 (H) 10/18/2023   NA 139 10/18/2023   K 3.9 10/18/2023   CL 108 10/18/2023   CO2 25 10/18/2023   BUN 14 10/18/2023   CREATININE 0.81 10/18/2023   GFRNONAA >60 10/18/2023   CALCIUM 9.3 10/18/2023   PROT 7.2 10/18/2023   ALBUMIN 4.2 10/18/2023   LABGLOB 2.3 03/12/2018   AGRATIO 2.0 03/12/2018   BILITOT 1.2  10/18/2023   ALKPHOS 84 10/18/2023   AST 40 10/18/2023   ALT 49 (H) 10/18/2023   ANIONGAP 6 10/18/2023    Assessment and Plan: * NSTEMI (non-ST elevated myocardial infarction) (HCC) Recurring chest pain in the setting of recent STEMI requiring  PCI with DES proximal RCA 10/15/23  Uptrending troponins 3780-->5620 over past 72 hours  EKG stable  Heparin gtt started in ER  Dr. Darrold Junker with cardiology consulted Plan for observation with continuation of heparin drip as well as antithrombotic regimen from discharge including aspirin, Effient, Toprol, low-dose lisinopril and Lipitor  Cardiology to formally reassess including repeat catheterization chest pain worsen or clinical condition significantly decompensates Monitor closely    Anxiety and depression Continue BuSpar and Ativan  Tobacco abuse Nicotine patch    Impaired fasting glucose Blood sugars 110s Monitor    Asthma Stable from a respiratory standpoint Continue home inhalers      Advance Care Planning:   Code Status: Full Code   Consults: Cardiology  Family Communication: family at the bedside   Severity of Illness: The appropriate patient status for this patient is INPATIENT. Inpatient status is judged to be reasonable and necessary in order to provide the required intensity of service to ensure the patient's safety. The patient's presenting symptoms, physical exam findings, and initial radiographic and laboratory data in the context of their chronic comorbidities is felt to place them  at high risk for further clinical deterioration. Furthermore, it is not anticipated that the patient will be medically stable for discharge from the hospital within 2 midnights of admission.   * I certify that at the point of admission it is my clinical judgment that the patient will require inpatient hospital care spanning beyond 2 midnights from the point of admission due to high intensity of service, high risk for further deterioration and high frequency of surveillance required.*  Author: Floydene Flock, MD 10/18/2023 11:16 AM  For on call review [email or web address].

## 2023-10-18 NOTE — Progress Notes (Signed)
 PHARMACY - ANTICOAGULATION CONSULT NOTE  Pharmacy Consult for heparin infusion Indication: chest pain/ACS  Allergies  Allergen Reactions   Ciprofloxacin     Acute kidney failure    Patient Measurements: Height: 6\' 5"  (195.6 cm) Weight: 108.9 kg (240 lb) IBW/kg (Calculated) : 89.1 Heparin Dosing Weight: 108.9 kg  Vital Signs: Temp: 97.9 F (36.6 C) (03/01 0810) Temp Source: Oral (03/01 0810) BP: 112/78 (03/01 0836) Pulse Rate: 63 (03/01 0836)  Labs: Recent Labs    10/15/23 1523 10/15/23 1529 10/15/23 1737 10/16/23 0419  HGB  --  16.0  --  13.8  HCT  --  45.0  --  37.9*  PLT  --  291  --  219  LABPROT 13.1  --   --   --   INR 1.0  --   --   --   CREATININE  --  0.81  --  0.76  TROPONINIHS  --  2,535* 3,780*  --     Estimated Creatinine Clearance: 148.2 mL/min (by C-G formula based on SCr of 0.76 mg/dL).   Medical History: Past Medical History:  Diagnosis Date   Acute kidney failure (HCC) 01/2012   Anxiety    Bipolar disorder (HCC)    hypomania   COPD (chronic obstructive pulmonary disease) (HCC)    Depression    Low back pain     Assessment: 53 year old man past medical history of anxiety, depression and bipolar disorder. He presented to the hospital with chest pains. Based on dispense records he is on no chronic anticoagulation (noted to be on prasugrel) PTA.    Baseline Labs: ordered & pending results  Goal of Therapy:  Heparin level 0.3-0.7 units/ml Monitor platelets by anticoagulation protocol: Yes   Plan:  Give 4000 units bolus x 1 Start heparin infusion at 1500 units/hr Check anti-Xa level in 6 hours and daily while on heparin Continue to monitor H&H and platelets  Lowella Bandy 10/18/2023,8:42 AM

## 2023-10-18 NOTE — ED Notes (Signed)
 This RN notified Jon Billings, NP and Marikay Alar, MD regarding patient concerns about his HR briefly dropping in the 40's and increased pressure in his chest during those episodes. Awaiting orders at this time. Will continue to monitor.

## 2023-10-18 NOTE — Consult Note (Signed)
 [facility] Cardiology  CARDIOLOGY CONSULT NOTE  Patient ID: Matthew [name] MRN: [id number] DOB/AGE: 53-Feb-1972 53 y.o.  Admit date: 10/18/2023 Referring Physician [facility] Primary Physician  Primary Cardiologist   Reason for Consultation chest pain  HPI: 53 year old gentleman referred for evaluation of chest pain.  Patient was admitted 10/15/2023 for greater than 12 hours of on and off substernal chest pain.  ECG revealed 1 mm of ST elevation in lead [name] with minimal ST elevation in lead aVF with initial troponin 2535.  Code STEMI was called, patient was brought to the cardiac catheterization laboratory, which revealed occluded proximal RCA, and the patient underwent primary PCI, receiving 3.0 x 30 mm Onyx Frontier drug-eluting stent.  Troponin prior to discharge of 3780.  On 10/17/2023, the patient experienced recurrent substernal chest discomfort, with radiation to his right arm, starting at 10 PM, was at its worst at 3 AM, presented to [facility] emergency room at 8 AM this morning, ECG revealing sinus rhythm, with minimal ST elevations leads [name] and aVF appear to be less compared to ECG on discharge 04/14/2024.  Troponin was 5619 distant with upward trend from initial presentation 10/15/2023.  The patient was treated with sublingual nitroglycerin with resolution of chest pain.  Review of systems complete and found to be negative unless listed above     Past Medical History:  Diagnosis Date   Acute kidney failure (HCC) 01/2012   Anxiety    Bipolar disorder (HCC)    hypomania   COPD (chronic obstructive pulmonary disease) (HCC)    Depression    Low back pain     Past Surgical History:  Procedure Laterality Date   CORONARY/GRAFT ACUTE MI REVASCULARIZATION N/A 10/15/2023   Procedure: Coronary/Graft Acute MI Revascularization;  Surgeon: Marcina Millard, MD;  Location: [facility] INVASIVE CV LAB;  Service: Cardiovascular;  Laterality: N/A;   LEFT HEART CATH AND CORONARY ANGIOGRAPHY N/A 10/15/2023    Procedure: LEFT HEART CATH AND CORONARY ANGIOGRAPHY;  Surgeon: Marcina Millard, MD;  Location: [facility] INVASIVE CV LAB;  Service: Cardiovascular;  Laterality: N/A;   NO PAST SURGERIES      (Not in a hospital admission)  Social History   Socioeconomic History   Marital status: Married    Spouse name: Not on file   Number of children: Not on file   Years of education: Not on file   Highest education level: Not on file  Occupational History   Not on file  Tobacco Use   Smoking status: Every Day    Current packs/day: 2.00    Average packs/day: 2.0 packs/day for 20.0 years (40.0 ttl pk-yrs)    Types: Cigarettes   Smokeless tobacco: Never  Vaping Use   Vaping status: Every Day   Substances: CBD  Substance and Sexual Activity   Alcohol use: Yes    Alcohol/week: 3.0 standard drinks of alcohol    Types: 3 Shots of liquor per week   Drug use: Yes    Types: Marijuana    Comment: daily- quit 1 month ago   Sexual activity: Yes    Partners: Female    Birth control/protection: None  Other Topics Concern   Not on file  Social History Narrative   Not on file   Social Drivers of Health   Financial Resource Strain: Not on file  Food Insecurity: No Food Insecurity (10/15/2023)   Hunger Vital Sign    Worried About Running Out of Food in the Last Year: Never true    Ran Out of Food  in the Last Year: Never true  Transportation Needs: No Transportation Needs (10/15/2023)   PRAPARE - Administrator, Civil Service (Medical): No    Lack of Transportation (Non-Medical): No  Physical Activity: Not on file  Stress: Not on file  Social Connections: Socially Integrated (10/15/2023)   Social Connection and Isolation Panel [NHANES]    Frequency of Communication with Friends and Family: More than three times a week    Frequency of Social Gatherings with Friends and Family: More than three times a week    Attends Religious Services: 1 to 4 times per year    Active Member of [employer] or  Organizations: Yes    Attends Banker Meetings: 1 to 4 times per year    Marital Status: Married  Catering manager Violence: Not At Risk (10/15/2023)   Humiliation, Afraid, Rape, and Kick questionnaire    Fear of Current or Ex-Partner: No    Emotionally Abused: No    Physically Abused: No    Sexually Abused: No    Family History  Problem Relation Age of Onset   Anxiety disorder Mother    Alcohol abuse Father    Drug abuse Father    Bipolar disorder Sister    Alcohol abuse Brother    Drug abuse Brother    Bipolar disorder Paternal Aunt    Schizophrenia Paternal Aunt    Hypertension Maternal Grandfather    Depression Maternal Grandmother    Cancer Maternal Grandmother        lung   Hypertension Paternal Grandfather    Cancer Paternal Grandmother        lung   Diabetes Neg Hx    Heart disease Neg Hx    Hyperlipidemia Neg Hx    Stroke Neg Hx    Suicidality Neg Hx       Review of systems complete and found to be negative unless listed above      PHYSICAL EXAM  General: Well developed, well nourished, in no acute distress HEENT:  Normocephalic and atramatic Neck:  No JVD.  Lungs: Clear bilaterally to auscultation and percussion. Heart: HRRR . Normal S1 and S2 without gallops or murmurs.  Abdomen: Bowel sounds are positive, abdomen soft and non-tender  Msk:  Back normal, normal gait. Normal strength and tone for age. Extremities: No clubbing, cyanosis or edema.   Neuro: Alert and oriented X 3. Psych:  Good affect, responds appropriately  Labs:   Lab Results  Component Value Date   WBC 10.6 (H) 10/18/2023   HGB 14.5 10/18/2023   HCT 40.9 10/18/2023   MCV 91.3 10/18/2023   PLT 241 10/18/2023    Recent Labs  Lab 10/18/23 0831  NA 139  K 3.9  CL 108  CO2 25  BUN 14  CREATININE 0.81  CALCIUM 9.3  PROT 7.2  BILITOT 1.2  ALKPHOS 84  ALT 49*  AST 40  GLUCOSE 113*   No results found for: "CKTOTAL", "CKMB", "CKMBINDEX", "TROPONINI"  Lab  Results  Component Value Date   CHOL 145 10/16/2023   CHOL 161 03/09/2019   CHOL 155 03/12/2018   Lab Results  Component Value Date   HDL 22 (L) 10/16/2023   HDL 30.60 (L) 03/09/2019   HDL 26 (L) 03/12/2018   Lab Results  Component Value Date   LDLCALC 74 10/16/2023   LDLCALC 96 03/09/2019   LDLCALC 57 03/12/2018   Lab Results  Component Value Date   TRIG 244 (H) 10/16/2023  TRIG 174.0 (H) 03/09/2019   TRIG 360 (H) 03/12/2018   Lab Results  Component Value Date   CHOLHDL 6.6 10/16/2023   CHOLHDL 5 03/09/2019   CHOLHDL 5 01/21/2011   No results found for: "LDLDIRECT"    Radiology: ECHOCARDIOGRAM COMPLETE Result Date: 10/17/2023    ECHOCARDIOGRAM REPORT   Patient Name:   Matthew [name] Date of Exam: 10/16/2023 Medical Rec #:  [id number]              Height:       77.0 in Accession #:    [id number]             Weight:       240.0 lb Date of Birth:  30-Jan-1971              BSA:          2.417 m Patient Age:    52 years               BP:           124/93 mmHg Patient Gender: M                      HR:           93 bpm. Exam Location:  [facility] Procedure: 2D Echo, 3D Echo and Strain Analysis (Both Spectral and Color Flow            Doppler were utilized during procedure). Indications:     Acute myocardial infarction , unspecified I21.9  History:         Patient has no prior history of Echocardiogram examinations.                  COPD. Acute kidney failure,.  Sonographer:     Cristela Blue Referring Phys:  [id number] CARALYN HUDSON Diagnosing Phys: Marcina Millard MD  Sonographer Comments: Global longitudinal strain was attempted. IMPRESSIONS  1. Left ventricular ejection fraction, by estimation, is 60 to 65%. The left ventricle has normal function. The left ventricle has no regional wall motion abnormalities. Left ventricular diastolic parameters were normal. The global longitudinal strain is normal.  2. Right ventricular systolic function is normal. The right ventricular size is normal.   3. The mitral valve is normal in structure. No evidence of mitral valve regurgitation. No evidence of mitral stenosis.  4. The aortic valve is normal in structure. Aortic valve regurgitation is not visualized. No aortic stenosis is present.  5. The inferior vena cava is normal in size with greater than 50% respiratory variability, suggesting right atrial pressure of 3 mmHg. FINDINGS  Left Ventricle: Left ventricular ejection fraction, by estimation, is 60 to 65%. The left ventricle has normal function. The left ventricle has no regional wall motion abnormalities. Strain was performed and the global longitudinal strain is normal. The  left ventricular internal cavity size was normal in size. There is no left ventricular hypertrophy. Left ventricular diastolic parameters were normal. Right Ventricle: The right ventricular size is normal. No increase in right ventricular wall thickness. Right ventricular systolic function is normal. Left Atrium: Left atrial size was normal in size. Right Atrium: Right atrial size was normal in size. Pericardium: There is no evidence of pericardial effusion. Mitral Valve: The mitral valve is normal in structure. No evidence of mitral valve regurgitation. No evidence of mitral valve stenosis. MV peak gradient, 1.9 mmHg. The mean mitral valve gradient is 1.0 mmHg. Tricuspid Valve: The tricuspid valve is  normal in structure. Tricuspid valve regurgitation is not demonstrated. No evidence of tricuspid stenosis. Aortic Valve: The aortic valve is normal in structure. Aortic valve regurgitation is not visualized. No aortic stenosis is present. Aortic valve mean gradient measures 3.0 mmHg. Aortic valve peak gradient measures 5.0 mmHg. Aortic valve area, by VTI measures 3.16 cm. Pulmonic Valve: The pulmonic valve was normal in structure. Pulmonic valve regurgitation is not visualized. No evidence of pulmonic stenosis. Aorta: The aortic root is normal in size and structure. Venous: The inferior  vena cava is normal in size with greater than 50% respiratory variability, suggesting right atrial pressure of 3 mmHg. IAS/Shunts: No atrial level shunt detected by color flow Doppler. Additional Comments: 3D was performed not requiring image post processing on an independent workstation and was normal.  LEFT VENTRICLE PLAX 2D LVIDd:         5.50 cm   Diastology LVIDs:         3.40 cm   LV e' medial:    9.68 cm/s LV PW:         1.20 cm   LV E/e' medial:  5.7 LV IVS:        0.90 cm   LV e' lateral:   8.59 cm/s LVOT diam:     2.20 cm   LV E/e' lateral: 6.5 LV SV:         56 LV SV Index:   23 LVOT Area:     3.80 cm  RIGHT VENTRICLE RV Basal diam:  3.50 cm RV Mid diam:    3.30 cm RV S prime:     14.80 cm/s TAPSE (M-mode): 1.7 cm LEFT ATRIUM             Index        RIGHT ATRIUM           Index LA diam:        2.50 cm 1.03 cm/m   RA Area:     14.20 cm LA Vol (A2C):   50.0 ml 20.69 ml/m  RA Volume:   36.20 ml  14.98 ml/m LA Vol (A4C):   32.6 ml 13.49 ml/m LA Biplane Vol: 41.0 ml 16.96 ml/m  AORTIC VALVE AV Area (Vmax):    2.49 cm AV Area (Vmean):   2.58 cm AV Area (VTI):     3.16 cm AV Vmax:           112.00 cm/s AV Vmean:          78.800 cm/s AV VTI:            0.177 m AV Peak Grad:      5.0 mmHg AV Mean Grad:      3.0 mmHg LVOT Vmax:         73.40 cm/s LVOT Vmean:        53.400 cm/s LVOT VTI:          0.147 m LVOT/AV VTI ratio: 0.83  AORTA Ao Root diam: 3.40 cm MITRAL VALVE               TRICUSPID VALVE MV Area (PHT): 5.42 cm    TR Peak grad:   15.4 mmHg MV Area VTI:   4.54 cm    TR Vmax:        196.00 cm/s MV Peak grad:  1.9 mmHg MV Mean grad:  1.0 mmHg    SHUNTS MV Vmax:       0.70 m/s    Systemic VTI:  0.15  m MV Vmean:      48.8 cm/s   Systemic Diam: 2.20 cm MV Decel Time: 140 msec MV E velocity: 55.50 cm/s MV A velocity: 59.70 cm/s MV E/A ratio:  0.93 Marcina Millard MD Electronically signed by Marcina Millard MD Signature Date/Time: 10/17/2023/1:10:02 PM    Final    CARDIAC  CATHETERIZATION Result Date: 10/15/2023   Prox RCA to Mid RCA lesion is 100% stenosed.   A drug-eluting stent was successfully placed using a STENT ONYX FRONTIER 3.0X30.   Post intervention, there is a 0% residual stenosis.   There is mild left ventricular systolic dysfunction.   LV end diastolic pressure is normal.   The left ventricular ejection fraction is 45-50% by visual estimate. 1.  Inferior STEMI 2.  Mildly reduced left ventricular function with estimated LV ejection fraction 45-50% 3.  Successful primary PCI with 3.0 x 30 mm Onyx Frontier drug-eluting stent proximal RCA Recommendations 1.  Dual antiplatelet therapy uninterrupted x 1 year 2.  Start metoprolol succinate 25 mg daily 3.  Start atorvastatin 80 mg daily 4.  2D echocardiogram 5.  Strongly advised patient to stop smoking   DG Chest 2 View Result Date: 10/15/2023 CLINICAL DATA:  Chest pain EXAM: CHEST - 2 VIEW COMPARISON:  08/31/2021 FINDINGS: The heart size and mediastinal contours are within normal limits. Both lungs are clear. The visualized skeletal structures are unremarkable. IMPRESSION: No active cardiopulmonary disease. Electronically Signed   By: Judie Petit.  Shick M.D.   On: 10/15/2023 15:53    EKG: Sinus rhythm with less than 1 mm ST elevation in leads [name] and aVF  ASSESSMENT AND PLAN:   1.  Chest pain, recurrent after recent inferior STEMI, primary PCI with DES proximal RCA, with nondiagnostic ECG, elevated troponin consistent with recent inferior STEMI 2.  Inferior STEMI 10/15/2023, status post primary PCI with DES proximal RCA 3.  Essential hypertension, blood pressure in normal range 4.  Hyperlipidemia  Recommendations  1.  Agree with current therapy 2.  Continue dual antiplatelet therapy 3.  Continue heparin for now 4.  Defer emergent cardiac catheterization at this time 5.  Continue metoprolol succinate 25 mg daily 6.  Continue atorvastatin 80 mg daily 7.  Add isosorbide mononitrate 30 mg daily  Signed: Marcina Millard MD,PhD, [facility] 10/18/2023, 10:07 AM

## 2023-10-18 NOTE — ED Provider Notes (Signed)
 [facility] Provider Note    Event Date/Time   First MD Initiated Contact with Patient 10/18/23 0815     (approximate)   History   Chest Pain   HPI  Matthew [name] is a 53 y.o. male with history of CAD status post stent placement 2 days ago per review of records who presents with chest pain and right arm discomfort.  He reports this started late last night and has continued throughout the night.  He reports it is similar to symptoms that he had prior to stent placement.  He reports compliance with his medications.  No back pain, no abdominal pain     Physical Exam   Triage Vital Signs: ED Triage Vitals  Encounter Vitals Group     BP 10/18/23 0810 116/72     Systolic BP Percentile --      Diastolic BP Percentile --      Pulse Rate 10/18/23 0810 67     Resp 10/18/23 0810 18     Temp 10/18/23 0810 97.9 F (36.6 C)     Temp Source 10/18/23 0810 Oral     SpO2 10/18/23 0810 99 %     Weight 10/18/23 0811 108.9 kg (240 lb)     Height 10/18/23 0811 1.956 m (6\' 5" )     Head Circumference --      Peak Flow --      Pain Score 10/18/23 0811 3     Pain Loc --      Pain Education --      Exclude from Growth Chart --     Most recent vital signs: Vitals:   10/18/23 1230 10/18/23 1255  BP: 125/78   Pulse: (!) 51   Resp: 20   Temp:  97.9 F (36.6 C)  SpO2: 100%      General: Awake, no distress.  CV:  Good peripheral perfusion.  Resp:  Normal effort.  Abd:  No distention.  Other:     ED Results / Procedures / Treatments   Labs (all labs ordered are listed, but only abnormal results are displayed) Labs Reviewed  CBC - Abnormal; Notable for the following components:      Result Value   WBC 10.6 (*)    All other components within normal limits  COMPREHENSIVE METABOLIC PANEL - Abnormal; Notable for the following components:   Glucose, Bld 113 (*)    ALT 49 (*)    All other components within normal limits  HEPARIN LEVEL  (UNFRACTIONATED) - Abnormal; Notable for the following components:   Heparin Unfractionated <0.10 (*)    All other components within normal limits  TROPONIN I (HIGH SENSITIVITY) - Abnormal; Notable for the following components:   Troponin I (High Sensitivity) 5,619 (*)    All other components within normal limits  TROPONIN I (HIGH SENSITIVITY) - Abnormal; Notable for the following components:   Troponin I (High Sensitivity) 5,338 (*)    All other components within normal limits  LIPASE, BLOOD  APTT  PROTIME-INR  TROPONIN I (HIGH SENSITIVITY)     EKG  ED ECG REPORT I, Jene Every, the attending physician, personally viewed and interpreted this ECG.  Date: 10/18/2023  Rhythm: normal sinus rhythm QRS Axis: normal Intervals: normal ST/T Wave abnormalities: Similar abnormality in lead [name] as postprocedure,  Narrative Interpretation: No significant change from prior EKG, discussed with Dr. Darrold Junker of cardiology who agrees    RADIOLOGY Chest x-ray without acute abnormality    PROCEDURES:  Critical Care performed: yes  CRITICAL CARE Performed by: Jene Every   Total critical care time: 30 minutes  Critical care time was exclusive of separately billable procedures and treating other patients.  Critical care was necessary to treat or prevent imminent or life-threatening deterioration.  Critical care was time spent personally by me on the following activities: development of treatment plan with patient and/or surrogate as well as nursing, discussions with consultants, evaluation of patient's response to treatment, examination of patient, obtaining history from patient or surrogate, ordering and performing treatments and interventions, ordering and review of laboratory studies, ordering and review of radiographic studies, pulse oximetry and re-evaluation of patient's condition.   Procedures   MEDICATIONS ORDERED IN ED: Medications  heparin ADULT infusion 100  units/mL (25000 units/239mL) (1,500 Units/hr Intravenous Infusion Verify 10/18/23 1108)  nitroGLYCERIN (NITROSTAT) SL tablet 0.4 mg (has no administration in time range)  acetaminophen (TYLENOL) tablet 650 mg (has no administration in time range)  morphine (PF) 4 MG/ML injection 4 mg (has no administration in time range)  ondansetron (ZOFRAN) injection 4 mg (has no administration in time range)  atorvastatin (LIPITOR) tablet 80 mg (80 mg Oral Patient Refused/Not Given 10/18/23 1249)  aspirin chewable tablet 81 mg (81 mg Oral Given 10/18/23 1146)  busPIRone (BUSPAR) tablet 10 mg (has no administration in time range)  lisinopril (ZESTRIL) tablet 2.5 mg (has no administration in time range)  LORazepam (ATIVAN) tablet 1 mg (has no administration in time range)  prasugrel (EFFIENT) tablet 10 mg (has no administration in time range)  metoprolol succinate (TOPROL-XL) 24 hr tablet 25 mg (has no administration in time range)  morphine (PF) 4 MG/ML injection 4 mg (4 mg Intravenous Given 10/18/23 0838)  ondansetron (ZOFRAN) injection 4 mg (4 mg Intravenous Given 10/18/23 0837)  heparin bolus via infusion 4,000 Units (4,000 Units Intravenous Bolus from Bag 10/18/23 0902)     IMPRESSION / MDM / ASSESSMENT AND PLAN / ED COURSE  I reviewed the triage vital signs and the nursing notes. Patient's presentation is most consistent with acute presentation with potential threat to life or bodily function.  Patient presents with chest pain in the setting of recent cardiac stenting.  Differential includes ACS, stent thrombosis  Discussed with Dr. Darrold Junker of cardiology who agrees no significant change in EKG from postprocedure  Recommends pain control, heparin GTT, he will see the patient in the emergency department  Labs pending  ----------------------------------------- 9:37 AM on 10/18/2023 ----------------------------------------- Troponin over 5000, somewhat in line has a peak from earlier event, Dr. Darrold Junker is  aware and recommends continue to trend troponins admit to the hospitalist team      FINAL CLINICAL IMPRESSION(S) / ED DIAGNOSES   Final diagnoses:  NSTEMI (non-ST elevated myocardial infarction) [facility])     Rx / DC Orders   ED Discharge Orders     None        Note:  This document was prepared using Dragon voice recognition software and may include unintentional dictation errors.   Jene Every, MD 10/18/23 1314

## 2023-10-18 NOTE — Progress Notes (Signed)
 PHARMACY - ANTICOAGULATION CONSULT NOTE  Pharmacy Consult for heparin infusion Indication: chest pain/ACS  Allergies  Allergen Reactions   Ciprofloxacin     Acute kidney failure    Patient Measurements: Height: 6\' 5"  (195.6 cm) Weight: 108.9 kg (240 lb) IBW/kg (Calculated) : 89.1 Heparin Dosing Weight: 108.9 kg  Vital Signs: Temp: 97.9 F (36.6 C) (03/01 1255) Temp Source: Oral (03/01 1255) BP: 130/85 (03/01 1630) Pulse Rate: 79 (03/01 1630)  Labs: Recent Labs    10/16/23 0419 10/18/23 0831 10/18/23 1024 10/18/23 1255 10/18/23 1630  HGB 13.8 14.5  --   --   --   HCT 37.9* 40.9  --   --   --   PLT 219 241  --   --   --   APTT  --  33  --   --   --   LABPROT  --  13.1  --   --   --   INR  --  1.0  --   --   --   HEPARINUNFRC  --  <0.10*  --   --  0.35  CREATININE 0.76 0.81  --   --   --   TROPONINIHS  --  5,619* 5,338* 4,970*  --     Estimated Creatinine Clearance: 146.4 mL/min (by C-G formula based on SCr of 0.81 mg/dL).   Medical History: Past Medical History:  Diagnosis Date   Acute kidney failure (HCC) 01/2012   Anxiety    Bipolar disorder (HCC)    hypomania   COPD (chronic obstructive pulmonary disease) (HCC)    Depression    Low back pain     Assessment: 53 year old man past medical history of anxiety, depression and bipolar disorder. He presented to the hospital with chest pains. Based on dispense records he is on no chronic anticoagulation (noted to be on prasugrel) PTA.    Baseline Labs: ordered & pending results  Date Time HL Rate/Comment  3/01 1630 0.35 Therapeutic x1 on 1500 units/hour  Goal of Therapy:  Heparin level 0.3-0.7 units/ml Monitor platelets by anticoagulation protocol: Yes   Plan:  Continue heparin infusion at 1500 units/hour Check confirmatory heparin level in 6 hours Monitor CBC and signs/symptoms of bleeding  Thank you for involving pharmacy in this patient's care.   Rockwell Alexandria, PharmD Clinical  Pharmacist 10/18/2023 5:01 PM

## 2023-10-18 NOTE — Assessment & Plan Note (Addendum)
 Marland Kitchen

## 2023-10-18 NOTE — ED Triage Notes (Signed)
 Patient was here on Wednesday and had MI went to the Cath lab and had stent placed.  Patient complains of pressure in his chest going into his right arm.  Reports increased belching and heartburn.  Denies SOB sweating or dizziness.

## 2023-10-18 NOTE — ED Notes (Signed)
 Pt states constant chest pressure rated at 5 and palpitations. Alvester Morin, MD, made aware. Stat EKG and troponin ordered. Orders completed by this RN.

## 2023-10-18 NOTE — Assessment & Plan Note (Signed)
 Continue with lorazepam and buspirone.

## 2023-10-18 NOTE — ED Notes (Addendum)
 Paraschos, MD (Cardiology) at bedside at this time.

## 2023-10-18 NOTE — Assessment & Plan Note (Signed)
Smoking cessation counseling 

## 2023-10-18 NOTE — Assessment & Plan Note (Signed)
 Blood sugars 110s Monitor

## 2023-10-19 ENCOUNTER — Other Ambulatory Visit: Payer: Self-pay

## 2023-10-19 DIAGNOSIS — I4719 Other supraventricular tachycardia: Secondary | ICD-10-CM | POA: Diagnosis not present

## 2023-10-19 DIAGNOSIS — I2510 Atherosclerotic heart disease of native coronary artery without angina pectoris: Secondary | ICD-10-CM

## 2023-10-19 DIAGNOSIS — R079 Chest pain, unspecified: Secondary | ICD-10-CM | POA: Diagnosis not present

## 2023-10-19 DIAGNOSIS — I2583 Coronary atherosclerosis due to lipid rich plaque: Secondary | ICD-10-CM

## 2023-10-19 DIAGNOSIS — I10 Essential (primary) hypertension: Secondary | ICD-10-CM

## 2023-10-19 DIAGNOSIS — J4520 Mild intermittent asthma, uncomplicated: Secondary | ICD-10-CM

## 2023-10-19 DIAGNOSIS — F419 Anxiety disorder, unspecified: Secondary | ICD-10-CM | POA: Diagnosis not present

## 2023-10-19 LAB — CBC
HCT: 38.7 % — ABNORMAL LOW (ref 39.0–52.0)
Hemoglobin: 13.6 g/dL (ref 13.0–17.0)
MCH: 32.6 pg (ref 26.0–34.0)
MCHC: 35.1 g/dL (ref 30.0–36.0)
MCV: 92.8 fL (ref 80.0–100.0)
Platelets: 199 10*3/uL (ref 150–400)
RBC: 4.17 MIL/uL — ABNORMAL LOW (ref 4.22–5.81)
RDW: 12.6 % (ref 11.5–15.5)
WBC: 8.4 10*3/uL (ref 4.0–10.5)
nRBC: 0 % (ref 0.0–0.2)

## 2023-10-19 LAB — HEPARIN LEVEL (UNFRACTIONATED): Heparin Unfractionated: <0.1 [IU]/mL — ABNORMAL LOW (ref 0.30–0.70)

## 2023-10-19 MED ORDER — METOPROLOL SUCCINATE ER 25 MG PO TB24: 12.5000 mg | ORAL_TABLET | Freq: Every day | ORAL | 0 refills | Status: DC

## 2023-10-19 NOTE — Assessment & Plan Note (Signed)
 Continue rate control with metoprolol, dose reduced to avoid bradycardia.  Follow up with cardiology as outpatient.

## 2023-10-19 NOTE — Assessment & Plan Note (Signed)
 Continue blood pressure control with lisinopril and metoprolol.

## 2023-10-19 NOTE — Progress Notes (Signed)
 [facility] Cardiology  SUBJECTIVE: Patient is sitting on side of the bed, reports doing good, denies chest pain or shortness of breath, eager to go home   Vitals:   10/19/23 0400 10/19/23 0430 10/19/23 0600 10/19/23 0739  BP: 104/75 102/71 108/69   Pulse: (!) 55 80 (!) 59   Resp: 13 18 13    Temp:    97.8 F (36.6 C)  TempSrc:    Oral  SpO2: 100% 98% 97%   Weight:      Height:         Intake/Output Summary (Last 24 hours) at 10/19/2023 0853 Last data filed at 10/18/2023 1252 Gross per 24 hour  Intake 70.7 ml  Output 700 ml  Net -629.3 ml      PHYSICAL EXAM  General: Well developed, well nourished, in no acute distress HEENT:  Normocephalic and atramatic Neck:  No JVD.  Lungs: Clear bilaterally to auscultation and percussion. Heart: HRRR . Normal S1 and S2 without gallops or murmurs.  Abdomen: Bowel sounds are positive, abdomen soft and non-tender  Msk:  Back normal, normal gait. Normal strength and tone for age. Extremities: No clubbing, cyanosis or edema.   Neuro: Alert and oriented X 3. Psych:  Good affect, responds appropriately   LABS: Basic Metabolic Panel: Recent Labs    10/18/23 0831  NA 139  K 3.9  CL 108  CO2 25  GLUCOSE 113*  BUN 14  CREATININE 0.81  CALCIUM 9.3   Liver Function Tests: Recent Labs    10/18/23 0831  AST 40  ALT 49*  ALKPHOS 84  BILITOT 1.2  PROT 7.2  ALBUMIN 4.2   Recent Labs    10/18/23 0831  LIPASE 44   CBC: Recent Labs    10/18/23 0831 10/19/23 0028  WBC 10.6* 8.4  HGB 14.5 13.6  HCT 40.9 38.7*  MCV 91.3 92.8  PLT 241 199   Cardiac Enzymes: No results for input(s): "CKTOTAL", "CKMB", "CKMBINDEX", "TROPONINI" in the last 72 hours. BNP: Invalid input(s): "POCBNP" D-Dimer: No results for input(s): "DDIMER" in the last 72 hours. Hemoglobin A1C: No results for input(s): "HGBA1C" in the last 72 hours. Fasting Lipid Panel: No results for input(s): "CHOL", "HDL", "LDLCALC", "TRIG", "CHOLHDL", "LDLDIRECT" in the last  72 hours. Thyroid Function Tests: No results for input(s): "TSH", "T4TOTAL", "T3FREE", "THYROIDAB" in the last 72 hours.  Invalid input(s): "FREET3" Anemia Panel: No results for input(s): "VITAMINB12", "FOLATE", "FERRITIN", "TIBC", "IRON", "RETICCTPCT" in the last 72 hours.  DG Chest Port 1 View Result Date: 10/18/2023 CLINICAL DATA:  Chest pain. EXAM: PORTABLE CHEST 1 VIEW COMPARISON:  Chest radiograph dated 10/15/2023. FINDINGS: The heart size and mediastinal contours are within normal limits. Both lungs are clear. The visualized skeletal structures are unremarkable. IMPRESSION: No active disease. Electronically Signed   By: Romona Curls M.D.   On: 10/18/2023 09:52     Echo EF 60-65% 10/16/2023  TELEMETRY: Sinus rhythm:  ASSESSMENT AND PLAN:  Principal Problem:   NSTEMI (non-ST elevated myocardial infarction) (HCC) Active Problems:   Tobacco abuse   Asthma   Anxiety and depression   Impaired fasting glucose    1. Chest pain, recurrent after recent inferior STEMI, primary PCI with DES proximal RCA, with nondiagnostic ECG, elevated troponin consistent with recent inferior STEMI, chest pain resolved off of heparin 2.  Inferior STEMI 10/15/2023, status post primary PCI with DES proximal RCA 3.  Essential hypertension, blood pressure in normal range 4.  Hyperlipidemia   Recommendations   1.  Agree with current therapy 2.  Continue dual antiplatelet therapy was 1 year 3.  Continue metoprolol succinate 12.5 mg daily 4.  Continue atorvastatin 80 mg daily 5.  Patient has follow-up appointment with me 10/27/2023   Marcina Millard, MD, PhD, [facility] 10/19/2023 8:53 AM

## 2023-10-19 NOTE — ED Notes (Signed)
 Discharge instructions given to pt and pt wife. Both verbalized understanding. Pt departed facility ambulatory.

## 2023-10-19 NOTE — Hospital Course (Addendum)
 Mr. Matthew [name] was admitted to the hospital with the working diagnosis of chest pain.  53 yo male with the past medical history of coronary artery disease sp PCI to the RCA on 10/15/23, COPD, and anxiety who presented with chest pain.  Reported 2 days of intermittent chest pain, radiated to the right arm, associated with diaphoresis and bradycardia.  On his initial physical examination his blood pressure was 116/72, HR 67, RR 18 and 02 saturation 99%.  Lungs with no wheezing or rales, heart with S1 and S2 present and regular, abdomen with no distention and no lower extremity edema.   Na 139, K 3.9 Cl 108, bicarbonate 25 glucose 113, bun 14 cr 0,81  AST 44 and ALT 40  High sensitive troponin 5,619- 5,338- 4.970- 4,846- 4,695- 5.312  Wbc 10.6 hgb 14.5 plt 241   Chest radiograph with hypoinflation, with no infiltrates or effusions.   EKG 58 bpm, normal axis, normal intervals, atrial tachycardia rhythm with acceleration and de acceleration in heart rate, no significant ST segment changes, negative T wave lead II, III, avF (old lead III negative wave inversion).   Patient was placed on IV heparin for anticoagulation.  Patient was ruled out for acute coronary syndrome.  Heparin was discontinued.  Patient will be discharged on lower dose of metoprolol and continue medical therapy for coronary artery disease.  Follow up as outpatient.

## 2023-10-19 NOTE — Discharge Summary (Signed)
 Physician Discharge Summary   Patient: Matthew [name] MRN: [id number] DOB: 08/24/1970  Admit date:     10/18/2023  Discharge date: 10/19/23  Discharge Physician: Matthew [name]   PCP: Pcp, No   Recommendations at discharge:    Patient ruled out for acute coronary syndrome, metoprolol has been decreased to avoid bradycardia.  Continue aggressive medical therapy for coronary artery disease.  Follow up with Cardiology as scheduled on 10/27/23  Follow up with Primary Care in 7 to 10 days.   Discharge Diagnoses: Principal Problem:   Coronary artery disease Active Problems:   Atrial tachycardia (HCC)   Asthma   Anxiety and depression   Tobacco abuse  Resolved Problems:   * No resolved hospital problems. [facility] Course: Matthew [name] was admitted to the hospital with the working diagnosis of chest pain.  53 yo male with the past medical history of coronary artery disease sp PCI to the RCA on 10/15/23, COPD, and anxiety who presented with chest pain.  Reported 2 days of intermittent chest pain, radiated to the right arm, associated with diaphoresis and bradycardia.  On his initial physical examination his blood pressure was 116/72, HR 67, RR 18 and 02 saturation 99%.  Lungs with no wheezing or rales, heart with S1 and S2 present and regular, abdomen with no distention and no lower extremity edema.   Na 139, K 3.9 Cl 108, bicarbonate 25 glucose 113, bun 14 cr 0,81  AST 44 and ALT 40  High sensitive troponin 5,619- 5,338- 4.970- 4,846- 4,695- 5.312  Wbc 10.6 hgb 14.5 plt 241   Chest radiograph with hypoinflation, with no infiltrates or effusions.   EKG 58 bpm, normal axis, normal intervals, atrial tachycardia rhythm with acceleration and de acceleration in heart rate, no significant ST segment changes, negative T wave lead II, [name], avF (old lead [name] negative wave inversion).   Patient was placed on IV heparin for anticoagulation.  Patient was ruled out for acute  coronary syndrome.  Heparin was discontinued.  Patient will be discharged on lower dose of metoprolol and continue medical therapy for coronary artery disease.  Follow up as outpatient.     Assessment and Plan: * Coronary artery disease High sensitive troponin elevation and negative inferior T waves consistent with recent inferior myocardial infarction.  Currently patient is chest pain free.   Plan to continue medical therapy with atorvastatin, aspirin,  and prasugrel.  To avoid bradycardia decreased dose of metoprolol to 12.5 mg  Follow up with Cardiology as outpatient on 10/27/23  Patient has been ruled out for acute coronary syndrome.   Atrial tachycardia (HCC) Continue rate control with metoprolol, dose reduced to avoid bradycardia.  Follow up with cardiology as outpatient.   Essential hypertension Continue blood pressure control with lisinopril and metoprolol.   Anxiety and depression Continue with lorazepam and buspirone.   Tobacco abuse Smoking cessation counseling.    Asthma No signs of acute exacerbation.  Continue bronchodilator therapy.          Consultants: cardiology  Procedures performed: none   Disposition: Home Diet recommendation:  Cardiac diet DISCHARGE MEDICATION: Allergies as of 10/19/2023       Reactions   Ciprofloxacin    Acute kidney failure        Medication List     TAKE these medications    aspirin 81 MG chewable tablet Chew 1 tablet (81 mg total) by mouth daily.   atorvastatin 80 MG tablet Commonly known as: LIPITOR Take  1 tablet (80 mg total) by mouth daily.   busPIRone 15 MG tablet Commonly known as: BUSPAR Take 1/2 tablet (7.5 mg) by mouth daily AND 1 tablet (15 mg total) at bedtime.   lisinopril 2.5 MG tablet Commonly known as: ZESTRIL Take 1 tablet (2.5 mg total) by mouth at bedtime.   LORazepam 1 MG tablet Commonly known as: Ativan May take 1 tablet (1 mg total) by mouth 2 (two) times daily as needed for  anxiety. May also take 1 tablet (1 mg total) every other day as needed for anxiety. Start taking on: October 29, 2023   metoprolol succinate 25 MG 24 hr tablet Commonly known as: TOPROL-XL Take 0.5 tablets (12.5 mg total) by mouth daily. What changed:  how much to take additional instructions   nicotine 21 mg/24hr patch Commonly known as: NICODERM CQ - dosed in mg/24 hours One 21mg  patch chest wall daily (okay to substitute generic)   prasugrel 10 MG Tabs tablet Commonly known as: EFFIENT Take 1 tablet (10 mg total) by mouth daily.        Discharge Exam: Filed Weights   10/18/23 0811  Weight: 108.9 kg   BP 133/83 (BP Location: Right Arm)   Pulse (!) 53   Temp 97.7 F (36.5 C) (Oral)   Resp 16   Ht 6\' 5"  (1.956 m)   Wt 108.9 kg   SpO2 99%   BMI 28.46 kg/m   Patient with no further chest pain or palpitation, no dyspnea, PND or orthopnea.   Neurology awake and alert ENT with no pallor or icterus Cardiovascular with S1 and S2 present and regular with no gallops, rubs or murmurs Respiratory with no rales or wheezing, no rhonchi Abdomen with no distention   Condition at discharge: stable  The results of significant diagnostics from this hospitalization (including imaging, microbiology, ancillary and laboratory) are listed below for reference.   Imaging Studies: DG Chest Port 1 View Result Date: 10/18/2023 CLINICAL DATA:  Chest pain. EXAM: PORTABLE CHEST 1 VIEW COMPARISON:  Chest radiograph dated 10/15/2023. FINDINGS: The heart size and mediastinal contours are within normal limits. Both lungs are clear. The visualized skeletal structures are unremarkable. IMPRESSION: No active disease. Electronically Signed   By: Romona Curls M.D.   On: 10/18/2023 09:52   ECHOCARDIOGRAM COMPLETE Result Date: 10/17/2023    ECHOCARDIOGRAM REPORT   Patient Name:   Matthew [name] Date of Exam: 10/16/2023 Medical Rec #:  [id number]              Height:       77.0 in Accession #:     [id number]             Weight:       240.0 lb Date of Birth:  [birth year]              BSA:          2.417 m Patient Age:    52 years               BP:           124/93 mmHg Patient Gender: M                      HR:           93 bpm. Exam Location:  [facility] Procedure: 2D Echo, 3D Echo and Strain Analysis (Both Spectral and Color Flow  Doppler were utilized during procedure). Indications:     Acute myocardial infarction , unspecified I21.9  History:         Patient has no prior history of Echocardiogram examinations.                  COPD. Acute kidney failure,.  Sonographer:     Cristela Blue Referring Phys:  [id number] CARALYN HUDSON Diagnosing Phys: Marcina Millard MD  Sonographer Comments: Global longitudinal strain was attempted. IMPRESSIONS  1. Left ventricular ejection fraction, by estimation, is 60 to 65%. The left ventricle has normal function. The left ventricle has no regional wall motion abnormalities. Left ventricular diastolic parameters were normal. The global longitudinal strain is normal.  2. Right ventricular systolic function is normal. The right ventricular size is normal.  3. The mitral valve is normal in structure. No evidence of mitral valve regurgitation. No evidence of mitral stenosis.  4. The aortic valve is normal in structure. Aortic valve regurgitation is not visualized. No aortic stenosis is present.  5. The inferior vena cava is normal in size with greater than 50% respiratory variability, suggesting right atrial pressure of 3 mmHg. FINDINGS  Left Ventricle: Left ventricular ejection fraction, by estimation, is 60 to 65%. The left ventricle has normal function. The left ventricle has no regional wall motion abnormalities. Strain was performed and the global longitudinal strain is normal. The  left ventricular internal cavity size was normal in size. There is no left ventricular hypertrophy. Left ventricular diastolic parameters were normal. Right Ventricle: The right ventricular  size is normal. No increase in right ventricular wall thickness. Right ventricular systolic function is normal. Left Atrium: Left atrial size was normal in size. Right Atrium: Right atrial size was normal in size. Pericardium: There is no evidence of pericardial effusion. Mitral Valve: The mitral valve is normal in structure. No evidence of mitral valve regurgitation. No evidence of mitral valve stenosis. MV peak gradient, 1.9 mmHg. The mean mitral valve gradient is 1.0 mmHg. Tricuspid Valve: The tricuspid valve is normal in structure. Tricuspid valve regurgitation is not demonstrated. No evidence of tricuspid stenosis. Aortic Valve: The aortic valve is normal in structure. Aortic valve regurgitation is not visualized. No aortic stenosis is present. Aortic valve mean gradient measures 3.0 mmHg. Aortic valve peak gradient measures 5.0 mmHg. Aortic valve area, by VTI measures 3.16 cm. Pulmonic Valve: The pulmonic valve was normal in structure. Pulmonic valve regurgitation is not visualized. No evidence of pulmonic stenosis. Aorta: The aortic root is normal in size and structure. Venous: The inferior vena cava is normal in size with greater than 50% respiratory variability, suggesting right atrial pressure of 3 mmHg. IAS/Shunts: No atrial level shunt detected by color flow Doppler. Additional Comments: 3D was performed not requiring image post processing on an independent workstation and was normal.  LEFT VENTRICLE PLAX 2D LVIDd:         5.50 cm   Diastology LVIDs:         3.40 cm   LV e' medial:    9.68 cm/s LV PW:         1.20 cm   LV E/e' medial:  5.7 LV IVS:        0.90 cm   LV e' lateral:   8.59 cm/s LVOT diam:     2.20 cm   LV E/e' lateral: 6.5 LV SV:         56 LV SV Index:   23 LVOT Area:  3.80 cm  RIGHT VENTRICLE RV Basal diam:  3.50 cm RV Mid diam:    3.30 cm RV S prime:     14.80 cm/s TAPSE (M-mode): 1.7 cm LEFT ATRIUM             Index        RIGHT ATRIUM           Index LA diam:        2.50 cm 1.03  cm/m   RA Area:     14.20 cm LA Vol (A2C):   50.0 ml 20.69 ml/m  RA Volume:   36.20 ml  14.98 ml/m LA Vol (A4C):   32.6 ml 13.49 ml/m LA Biplane Vol: 41.0 ml 16.96 ml/m  AORTIC VALVE AV Area (Vmax):    2.49 cm AV Area (Vmean):   2.58 cm AV Area (VTI):     3.16 cm AV Vmax:           112.00 cm/s AV Vmean:          78.800 cm/s AV VTI:            0.177 m AV Peak Grad:      5.0 mmHg AV Mean Grad:      3.0 mmHg LVOT Vmax:         73.40 cm/s LVOT Vmean:        53.400 cm/s LVOT VTI:          0.147 m LVOT/AV VTI ratio: 0.83  AORTA Ao Root diam: 3.40 cm MITRAL VALVE               TRICUSPID VALVE MV Area (PHT): 5.42 cm    TR Peak grad:   15.4 mmHg MV Area VTI:   4.54 cm    TR Vmax:        196.00 cm/s MV Peak grad:  1.9 mmHg MV Mean grad:  1.0 mmHg    SHUNTS MV Vmax:       0.70 m/s    Systemic VTI:  0.15 m MV Vmean:      48.8 cm/s   Systemic Diam: 2.20 cm MV Decel Time: 140 msec MV E velocity: 55.50 cm/s MV A velocity: 59.70 cm/s MV E/A ratio:  0.93 Marcina Millard MD Electronically signed by Marcina Millard MD Signature Date/Time: 10/17/2023/1:10:02 PM    Final    CARDIAC CATHETERIZATION Result Date: 10/15/2023   Prox RCA to Mid RCA lesion is 100% stenosed.   A drug-eluting stent was successfully placed using a STENT ONYX FRONTIER 3.0X30.   Post intervention, there is a 0% residual stenosis.   There is mild left ventricular systolic dysfunction.   LV end diastolic pressure is normal.   The left ventricular ejection fraction is 45-50% by visual estimate. 1.  Inferior STEMI 2.  Mildly reduced left ventricular function with estimated LV ejection fraction 45-50% 3.  Successful primary PCI with 3.0 x 30 mm Onyx Frontier drug-eluting stent proximal RCA Recommendations 1.  Dual antiplatelet therapy uninterrupted x 1 year 2.  Start metoprolol succinate 25 mg daily 3.  Start atorvastatin 80 mg daily 4.  2D echocardiogram 5.  Strongly advised patient to stop smoking   DG Chest 2 View Result Date:  10/15/2023 CLINICAL DATA:  Chest pain EXAM: CHEST - 2 VIEW COMPARISON:  08/31/2021 FINDINGS: The heart size and mediastinal contours are within normal limits. Both lungs are clear. The visualized skeletal structures are unremarkable. IMPRESSION: No active cardiopulmonary disease. Electronically Signed   By: Judie Petit.  Shick M.D.   On: 10/15/2023 15:53    Microbiology: Results for orders placed or performed during the hospital encounter of 10/15/23  MRSA Next Gen by PCR, Nasal     Status: None   Collection Time: 10/15/23  8:04 PM   Specimen: Nasal Mucosa; Nasal Swab  Result Value Ref Range Status   MRSA by PCR Next Gen NOT DETECTED NOT DETECTED Final    Comment: (NOTE) The GeneXpert MRSA Assay (FDA approved for NASAL specimens only), is one component of a comprehensive MRSA colonization surveillance program. It is not intended to diagnose MRSA infection nor to guide or monitor treatment for MRSA infections. Test performance is not FDA approved in patients less than 6 years old. Performed at [facility], [address] [place], [place] [address]     Labs: CBC: Recent Labs  Lab 10/15/23 1529 10/16/23 0419 10/18/23 0831 10/19/23 0028  WBC 11.2* 7.2 10.6* 8.4  HGB 16.0 13.8 14.5 13.6  HCT 45.0 37.9* 40.9 38.7*  MCV 91.8 90.9 91.3 92.8  PLT 291 219 241 199   Basic Metabolic Panel: Recent Labs  Lab 10/15/23 1529 10/16/23 0419 10/18/23 0831  NA 137 137 139  K 3.7 3.4* 3.9  CL 104 107 108  CO2 23 21* 25  GLUCOSE 117* 125* 113*  BUN 9 11 14   CREATININE 0.81 0.76 0.81  CALCIUM 9.6 8.7* 9.3   Liver Function Tests: Recent Labs  Lab 10/18/23 0831  AST 40  ALT 49*  ALKPHOS 84  BILITOT 1.2  PROT 7.2  ALBUMIN 4.2   CBG: Recent Labs  Lab 10/15/23 1952  GLUCAP 118*    Discharge time spent: greater than 30 minutes.  Signed: Coralie Keens, MD [facility] 10/19/2023

## 2023-10-19 NOTE — Assessment & Plan Note (Addendum)
 High sensitive troponin elevation and negative inferior T waves consistent with recent inferior myocardial infarction.  Currently patient is chest pain free.   Plan to continue medical therapy with atorvastatin, aspirin,  and prasugrel.  To avoid bradycardia decreased dose of metoprolol to 12.5 mg  Follow up with Cardiology as outpatient on 10/27/23  Patient has been ruled out for acute coronary syndrome.

## 2023-10-19 NOTE — ED Notes (Signed)
 Report received from off going Nurse. Pt A&O x4. Pt sitting on edge of bed. No distress present. Bed low and locked, call light in reach.

## 2023-10-27 DIAGNOSIS — I10 Essential (primary) hypertension: Secondary | ICD-10-CM | POA: Diagnosis not present

## 2023-10-27 DIAGNOSIS — Z23 Encounter for immunization: Secondary | ICD-10-CM | POA: Diagnosis not present

## 2023-10-27 DIAGNOSIS — Z720 Tobacco use: Secondary | ICD-10-CM | POA: Diagnosis not present

## 2023-10-27 DIAGNOSIS — I2119 ST elevation (STEMI) myocardial infarction involving other coronary artery of inferior wall: Secondary | ICD-10-CM | POA: Diagnosis not present

## 2023-10-29 ENCOUNTER — Ambulatory Visit (INDEPENDENT_AMBULATORY_CARE_PROVIDER_SITE_OTHER): Payer: Self-pay | Admitting: Licensed Clinical Social Worker

## 2023-10-29 ENCOUNTER — Other Ambulatory Visit (HOSPITAL_COMMUNITY): Payer: Self-pay

## 2023-10-29 DIAGNOSIS — F410 Panic disorder [episodic paroxysmal anxiety] without agoraphobia: Secondary | ICD-10-CM | POA: Diagnosis not present

## 2023-10-29 DIAGNOSIS — F411 Generalized anxiety disorder: Secondary | ICD-10-CM

## 2023-10-29 DIAGNOSIS — F3181 Bipolar II disorder: Secondary | ICD-10-CM | POA: Diagnosis not present

## 2023-10-29 NOTE — Progress Notes (Signed)
 THERAPIST PROGRESS NOTE  Session Time: 10-10:48am  Participation Level: Active  Behavioral Response: CasualAlertAnxious  Type of Therapy: Individual Therapy  Treatment Goals addressed:      Goal: LTG: Matthew [name] will attend and participate in therapeutic, recreational and educational activities that support interpersonal effectiveness       Dates: Start:  09/17/23    Expected End:  03/16/24       Disciplines: Interdisciplinary, PROVIDER                      Goal: LTG: Matthew [name] will recognize socially inappropriate behaviors and develop alternative behaviors     Dates: Start:  09/17/23    Expected End:  03/16/24       Disciplines: Interdisciplinary, PROVIDER                     Goal: STG: Matthew [name] will demonstrate interest in social activities by initiating/joining social activity without prompts     Dates: Start:  09/17/23    Expected End:  03/16/24       Disciplines: Interdisciplinary, PROVIDER                Goal: STG: Matthew [name] will identify 2 behaviors that engage in social isolation               Goal: LTG: Matthew [name] will score less than 5 on the Generalized Anxiety Disorder 7 Scale (GAD-7)      Dates: Start:  09/17/23    Expected End:  03/16/24       Disciplines: Interdisciplinary, PROVIDER                 Goal: STG: Report a 50% reduction in the frequency of rehearsing thoughts related to social interactions.       ProgressTowards Goals: Progressing  Interventions: CBT, Supportive, Reframing, and Other: Mindfulness  Summary: Matthew [name] is a 53 y.o. male who presents with low mood, lack of motivation, difficulty concentrating, restlessness, uncontrollable worry, anxious feelings, ruminating thoughts, irritability, difficulty sleeping, tension, and suicidal thoughts. Pt was oriented times 5. Pt was cooperative and engaged. Pt denies HI/AVH.  Patient reports he is in the process of coming off his medications from his bipolar diagnosis and marks improvements as  he "does not feel like a zombie."  Patient reports while on these medications he would often think "I do not want to be here anymore."  Patient denies plan, intent, access to means.  Patient also reports while coming off medications he is noticing improvements with his depressive symptoms.  Patient continues to explore a diagnosis of autism and challenges his bipolar diagnosis.   The patient utilized the therapeutic space to process recent health concerns and provided updates regarding his health. He reported that he now recognizes the importance of managing his anxious symptoms to maintain good health. The clinician and the patient explored various coping skills and self-care techniques that the patient engages in to manage these symptoms. The patient expressed a desire to learn additional coping skills, prompting the clinician to educate him on grounding techniques using the five senses, 7-11 breathing, and acupressure breathing. The patient identified acupressure breathing as the most effective technique and plans to practice this skill daily until the next session to manage his anxiety.  The patient reported an improvement in his anxiety as he has been actively working on managing triggers related to irritability. This includes reflecting on his high expectations for himself and others, as well as  striving to communicate more transparently with those closest to him. The clinician and the patient processed stressors related to his work, discussing which factors are controllable versus uncontrollable. Explored ways for the patient to limit his exposure to stressors at work and also touched on his relationships at home, focusing on how he is maintaining healthy communication.   Suicidal/Homicidal: Yeswithout intent/plan  Therapist Response: Clinician utilized active listening to create a safe space for patient to process recent life events. Clinician assessed for current symptoms, stressors, safety since  last session.  Educated the patient on mindfulness techniques to manage his anxiety.  Continued to review assertive communication and ways in which she can establish healthy boundaries.  Plan: Return again in 2 weeks.  Diagnosis: GAD (generalized anxiety disorder)  Panic 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  Bipolar 2 disorder (HCC)   Collaboration of Care: AEB psychiatrist can access notes and cln. Will review psychiatrists' notes. Check in with the patient and will see LCSW per availability. Patient agreed with treatment recommendations.   Patient/Guardian was advised Release of Information must be obtained prior to any record release in order to collaborate their care with an outside provider. Patient/Guardian was advised if they have not already done so to contact the registration department to sign all necessary forms in order for [place] to release information regarding their care.   Consent: Patient/Guardian gives verbal consent for treatment and assignment of benefits for services provided during this visit. Patient/Guardian expressed understanding and agreed to proceed.   Dereck Leep, LCSW 10/29/2023

## 2023-11-03 NOTE — Addendum Note (Signed)
 Addended by: Durenda Hurt E on: 11/03/2023 02:39 PM   Modules accepted: Orders

## 2023-11-07 NOTE — Progress Notes (Signed)
 BP 122/78   Pulse 75   Temp (!) 97.5 F (36.4 C)   Ht 6\' 5"  (1.956 m)   Wt 248 lb 14.4 oz (112.9 kg)   SpO2 98%   BMI 29.52 kg/m    Subjective:    Patient ID: Matthew [name], male    DOB: 1971-04-04, 53 y.o.   MRN: [id number]  HPI: Matthew [name] is a 53 y.o. male  Chief Complaint  Patient presents with   Establish Care    Cardio,psych, counselor/therapist    Discussed the use of AI scribe software for clinical note transcription with the patient, who gave verbal consent to proceed.  History of Present Illness Matthew [name] is a 53 year old male with coronary artery disease, hx of MI and hypertension who presents to establish care.  He has a history of coronary artery disease and recently experienced a non-ST elevation myocardial infarction (NSTEMI) on October 18, 2023. He was seen by cardiology and underwent a primary percutaneous coronary intervention (PCI) with a 3.0 by 30 mm Onyx Frontier stent placed in the proximal right coronary artery (RCA) on October 15, 2023. He initially went to the hospital with a STEMI, was treated in the cath lab, and had to return a day or two later due to pressure, pain, and palpitations. It was determined that the metoprolol dose was too high, and it was adjusted to 12.5 mg daily. He has a follow-up with cardiology in June.  He has a history of hypertension, which was previously poorly controlled with blood pressure readings as high as 160/100 mmHg during his recent hospitalization. He is currently on lisinopril 2.5 mg daily, and his blood pressure is reported to be well-controlled now.  He is managed by psychiatry for bipolar disorder and anxiety. He is currently taking Buspar 15 mg at bedtime and 7.5 mg in the morning, and Ativan 1 mg twice a day as needed for anxiety. His psychiatric management is going well.  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 He previously used baclofen or Flexeril  as needed, but these were discontinued during his recent hospitalization due to concerns about heart health. His back is 'never good' and physical therapy was not successful in the past. He has not had a steroid injection in 7-8 years.  He has a history of acute kidney failure secondary to ciprofloxacin use, which resolved after discontinuation of the medication and administration of IV fluids. His kidney function is currently normal.      11/10/2023    1:14 PM 09/17/2023   10:02 AM 07/08/2023    1:43 PM  Depression screen PHQ 2/9  Decreased Interest 0    Down, Depressed, Hopeless 0    PHQ - 2 Score 0    Altered sleeping 0    Tired, decreased energy 0    Change in appetite 0    Feeling bad or failure about yourself  0    Trouble concentrating 0    Moving slowly or fidgety/restless 0    Suicidal thoughts 0    PHQ-9 Score 0    Difficult doing work/chores Not difficult at all       Information is confidential and restricted. Go to Review Flowsheets to unlock data.    Relevant past medical, surgical, family and social history reviewed and updated as indicated. Interim medical history since our last visit reviewed. Allergies and medications reviewed and updated.  Review of Systems  Constitutional: Negative for fever or weight change.  Respiratory: Negative for cough and shortness of breath.   Cardiovascular: Negative for chest pain or palpitations.  Gastrointestinal: Negative for abdominal pain, no bowel changes.  Musculoskeletal: Negative for gait problem or joint swelling.  Skin: Negative for rash.  Neurological: Negative for dizziness or headache.  No other specific complaints in a complete review of systems (except as listed in HPI above).      Objective:    BP 122/78   Pulse 75   Temp (!) 97.5 F (36.4 C)   Ht 6\' 5"  (1.956 m)   Wt 248 lb 14.4 oz (112.9 kg)   SpO2 98%   BMI 29.52 kg/m    Wt Readings from Last 3 Encounters:  11/10/23 248 lb 14.4 oz (112.9 kg)   10/18/23 240 lb (108.9 kg)  10/15/23 240 lb (108.9 kg)    Physical Exam Physical Exam GENERAL: Alert, cooperative, well developed, no acute distress HEENT: Normocephalic, normal oropharynx, moist mucous membranes CHEST: Clear to auscultation bilaterally, no wheezes, rhonchi, or crackles CARDIOVASCULAR: Normal heart rate and rhythm, S1 and S2 normal without murmurs ABDOMEN: Soft, non-tender, non-distended, without organomegaly, normal bowel sounds EXTREMITIES: No cyanosis or edema NEUROLOGICAL: Cranial nerves grossly intact, moves all extremities without gross motor or sensory deficit   Results for orders placed or performed during the hospital encounter of 10/18/23  CBC   Collection Time: 10/18/23  8:31 AM  Result Value Ref Range   WBC 10.6 (H) 4.0 - 10.5 K/uL   RBC 4.48 4.22 - 5.81 MIL/uL   Hemoglobin 14.5 13.0 - 17.0 g/dL   HCT 16.1 09.6 - 04.5 %   MCV 91.3 80.0 - 100.0 fL   MCH 32.4 26.0 - 34.0 pg   MCHC 35.5 30.0 - 36.0 g/dL   RDW 40.9 81.1 - 91.4 %   Platelets 241 150 - 400 K/uL   nRBC 0.0 0.0 - 0.2 %  Comprehensive metabolic panel   Collection Time: 10/18/23  8:31 AM  Result Value Ref Range   Sodium 139 135 - 145 mmol/L   Potassium 3.9 3.5 - 5.1 mmol/L   Chloride 108 98 - 111 mmol/L   CO2 25 22 - 32 mmol/L   Glucose, Bld 113 (H) 70 - 99 mg/dL   BUN 14 6 - 20 mg/dL   Creatinine, Ser 7.82 0.61 - 1.24 mg/dL   Calcium 9.3 8.9 - 95.6 mg/dL   Total Protein 7.2 6.5 - 8.1 g/dL   Albumin 4.2 3.5 - 5.0 g/dL   AST 40 15 - 41 U/L   ALT 49 (H) 0 - 44 U/L   Alkaline Phosphatase 84 38 - 126 U/L   Total Bilirubin 1.2 0.0 - 1.2 mg/dL   GFR, Estimated >21 >30 mL/min   Anion gap 6 5 - 15  Lipase, blood   Collection Time: 10/18/23  8:31 AM  Result Value Ref Range   Lipase 44 11 - 51 U/L  APTT   Collection Time: 10/18/23  8:31 AM  Result Value Ref Range   aPTT 33 24 - 36 seconds  Protime-INR   Collection Time: 10/18/23  8:31 AM  Result Value Ref Range   Prothrombin Time  13.1 11.4 - 15.2 seconds   INR 1.0 0.8 - 1.2  Heparin level (unfractionated)   Collection Time: 10/18/23  8:31 AM  Result Value Ref Range   Heparin Unfractionated <0.10 (L) 0.30 - 0.70 IU/mL  Troponin I (High Sensitivity)   Collection Time: 10/18/23  8:31 AM  Result Value Ref Range   Troponin I (High Sensitivity) 5,619 (HH) <18 ng/L  Troponin I (High Sensitivity)   Collection Time: 10/18/23 10:24 AM  Result Value Ref Range   Troponin I (High Sensitivity) 5,338 (HH) <18 ng/L  Troponin I (High Sensitivity)   Collection Time: 10/18/23 12:55 PM  Result Value Ref Range   Troponin I (High Sensitivity) 4,970 (HH) <18 ng/L  Troponin I (High Sensitivity)   Collection Time: 10/18/23  3:00 PM  Result Value Ref Range   Troponin I (High Sensitivity) 4,846 (HH) <18 ng/L  Heparin level (unfractionated)   Collection Time: 10/18/23  4:30 PM  Result Value Ref Range   Heparin Unfractionated 0.35 0.30 - 0.70 IU/mL  Troponin I (High Sensitivity)   Collection Time: 10/18/23  5:00 PM  Result Value Ref Range   Troponin I (High Sensitivity) 4,695 (HH) <18 ng/L  Troponin I (High Sensitivity)   Collection Time: 10/18/23  8:03 PM  Result Value Ref Range   Troponin I (High Sensitivity) 5,312 (HH) <18 ng/L  CBC   Collection Time: 10/19/23 12:28 AM  Result Value Ref Range   WBC 8.4 4.0 - 10.5 K/uL   RBC 4.17 (L) 4.22 - 5.81 MIL/uL   Hemoglobin 13.6 13.0 - 17.0 g/dL   HCT 16.1 (L) 09.6 - 04.5 %   MCV 92.8 80.0 - 100.0 fL   MCH 32.6 26.0 - 34.0 pg   MCHC 35.1 30.0 - 36.0 g/dL   RDW 40.9 81.1 - 91.4 %   Platelets 199 150 - 400 K/uL   nRBC 0.0 0.0 - 0.2 %  Heparin level (unfractionated)   Collection Time: 10/19/23 12:28 AM  Result Value Ref Range   Heparin Unfractionated <0.10 (L) 0.30 - 0.70 IU/mL       Assessment & Plan:   Problem List Items Addressed This Visit       Cardiovascular and Mediastinum   Coronary artery disease   Relevant Medications   aspirin 81 MG chewable tablet    atorvastatin (LIPITOR) 80 MG tablet   lisinopril (ZESTRIL) 2.5 MG tablet   metoprolol succinate (TOPROL-XL) 25 MG 24 hr tablet   Essential hypertension   Relevant Medications   aspirin 81 MG chewable tablet   atorvastatin (LIPITOR) 80 MG tablet   lisinopril (ZESTRIL) 2.5 MG tablet   metoprolol succinate (TOPROL-XL) 25 MG 24 hr tablet     Other   Bipolar 2 disorder, major depressive episode (HCC)   History of MI (myocardial infarction) - Primary   Relevant Medications   aspirin 81 MG chewable tablet   atorvastatin (LIPITOR) 80 MG tablet   lisinopril (ZESTRIL) 2.5 MG tablet   metoprolol succinate (TOPROL-XL) 25 MG 24 hr tablet   prasugrel (EFFIENT) 10 MG TABS tablet   Other Visit Diagnoses       Encounter to establish care            Assessment and Plan Assessment & Plan Coronary Artery Disease with recent STEMI Coronary artery disease with recent STEMI on October 15, 2023. Underwent primary PCI with a 3.0 by 30 mm Onyx Frontier stent in the proximal RCA. Hospitalized and initially placed on metoprolol, reduced to 12.5 mg due to excessive dosing causing pressure, pain, and palpitations. Managed by cardiology with follow-up in June. - Continue aspirin 81 mg daily, atorvastatin 80 mg daily, lisinopril 2.5 mg daily, metoprolol 12.5 mg daily, and Effient 10 mg daily. - Follow up with cardiology in June.  Hypertension Hypertension well-controlled on  current medication regimen. Blood pressure was elevated during hospitalization for STEMI but is currently stable. - Continue current antihypertensive regimen.  Bipolar Disorder and Anxiety Managed by psychiatry for bipolar disorder and anxiety. Currently taking Buspar and Ativan as needed for anxiety. Psychiatric management is well-managed. - Continue Buspar 15 mg at bedtime and 7.5 mg in the morning, and Ativan 1 mg twice a day as needed.  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 Previously used  baclofen or Flexeril as needed, discontinued due to potential cardiac risks. Has not had a steroid injection in 7-8 years and failed physical therapy 9 years ago. Advised against long-term use of muscle relaxants due to increased bleeding risk and cardiac concerns. - Consider referral to pain management for evaluation and potential interventions such as epidural injections if needed.  Resolved Acute Kidney Injury Acute kidney injury secondary to Cipro, resolved after discontinuation and IV fluids. Current kidney function is normal.  General Health Maintenance Engages in outdoor activities such as gardening, contributing to physical activity. Family history includes heart attacks in paternal and maternal grandfathers and father, as well as lung cancer.  Follow-up Establishing care and requires follow-up to monitor chronic conditions and medication management. - Schedule follow-up appointment in 6 months. - Ensure refills for cardiology medications are sent to [facility].        Follow up plan: Return in about 6 months (around 05/12/2024) for follow up.

## 2023-11-10 ENCOUNTER — Ambulatory Visit (INDEPENDENT_AMBULATORY_CARE_PROVIDER_SITE_OTHER): Admitting: Nurse Practitioner

## 2023-11-10 ENCOUNTER — Encounter: Payer: Self-pay | Admitting: Nurse Practitioner

## 2023-11-10 VITALS — BP 122/78 | HR 75 | Temp 97.5°F | Ht 77.0 in | Wt 248.9 lb

## 2023-11-10 DIAGNOSIS — I2510 Atherosclerotic heart disease of native coronary artery without angina pectoris: Secondary | ICD-10-CM

## 2023-11-10 DIAGNOSIS — I252 Old myocardial infarction: Secondary | ICD-10-CM | POA: Insufficient documentation

## 2023-11-10 DIAGNOSIS — Z7689 Persons encountering health services in other specified circumstances: Secondary | ICD-10-CM | POA: Diagnosis not present

## 2023-11-10 DIAGNOSIS — I10 Essential (primary) hypertension: Secondary | ICD-10-CM

## 2023-11-10 DIAGNOSIS — F3181 Bipolar II disorder: Secondary | ICD-10-CM

## 2023-11-10 DIAGNOSIS — I2583 Coronary atherosclerosis due to lipid rich plaque: Secondary | ICD-10-CM

## 2023-11-10 MED ORDER — PRASUGREL HCL 10 MG PO TABS: 10.0000 mg | ORAL_TABLET | Freq: Every day | ORAL | 1 refills | Status: DC

## 2023-11-10 MED ORDER — METOPROLOL SUCCINATE ER 25 MG PO TB24: 12.5000 mg | ORAL_TABLET | Freq: Every day | ORAL | 1 refills | Status: DC

## 2023-11-10 MED ORDER — ASPIRIN 81 MG PO CHEW: 81.0000 mg | CHEWABLE_TABLET | Freq: Every day | ORAL | 1 refills | Status: DC

## 2023-11-10 MED ORDER — ATORVASTATIN CALCIUM 80 MG PO TABS
80.0000 mg | ORAL_TABLET | Freq: Every day | ORAL | 1 refills | Status: DC
Start: 2023-11-10 — End: 2024-05-10

## 2023-11-10 MED ORDER — LISINOPRIL 2.5 MG PO TABS: 2.5000 mg | ORAL_TABLET | Freq: Every day | ORAL | 1 refills | Status: DC

## 2023-11-13 ENCOUNTER — Ambulatory Visit (INDEPENDENT_AMBULATORY_CARE_PROVIDER_SITE_OTHER): Payer: Self-pay | Admitting: Licensed Clinical Social Worker

## 2023-11-13 DIAGNOSIS — F439 Reaction to severe stress, unspecified: Secondary | ICD-10-CM | POA: Diagnosis not present

## 2023-11-13 DIAGNOSIS — F410 Panic disorder [episodic paroxysmal anxiety] without agoraphobia: Secondary | ICD-10-CM

## 2023-11-13 DIAGNOSIS — F411 Generalized anxiety disorder: Secondary | ICD-10-CM | POA: Diagnosis not present

## 2023-11-13 NOTE — Progress Notes (Signed)
 THERAPIST PROGRESS NOTE  Session Time: 2:04pm-2:55pm  Participation Level: Active  Behavioral Response: CasualAlertEuthymic  Type of Therapy: Individual Therapy  Treatment Goals addressed:  Goal: LTG: Matthew [name] will attend and participate in therapeutic, recreational and educational activities that support interpersonal effectiveness       Dates: Start:  09/17/23    Expected End:  03/16/24       Disciplines: Interdisciplinary, PROVIDER                            Goal: LTG: Matthew [name] will recognize socially inappropriate behaviors and develop alternative behaviors     Dates: Start:  09/17/23    Expected End:  03/16/24       Disciplines: Interdisciplinary, PROVIDER                         Goal: STG: Matthew [name] will demonstrate interest in social activities by initiating/joining social activity without prompts     Dates: Start:  09/17/23    Expected End:  03/16/24       Disciplines: Interdisciplinary, PROVIDER                Goal: STG: Matthew [name] will identify 2 behaviors that engage in social isolation                           Goal: LTG: Matthew [name] will score less than 5 on the Generalized Anxiety Disorder 7 Scale (GAD-7)      Dates: Start:  09/17/23    Expected End:  03/16/24       Disciplines: Interdisciplinary, PROVIDER                   Goal: STG: Report a 50% reduction in the frequency of rehearsing thoughts related to social interactions.       ProgressTowards Goals: Progressing  Interventions: CBT, Supportive, Reframing, and Other: EMDR  Summary: Matthew [name] is a 53 y.o. male who presents with low mood, lack of motivation, difficulty concentrating, restlessness, uncontrollable worry, anxious feelings, ruminating thoughts, irritability, difficulty sleeping, tension, and suicidal thoughts. Pt was oriented times 5. Pt was cooperative and engaged. Pt denies HI/AVH. Patient reports he is in the process of coming off his medications from his bipolar diagnosis and marks  improvements as he "does not feel like a zombie." Patient reports while on these medications he would often think "I do not want to be here anymore." Patient denies plan, intent, access to means. Patient also reports while coming off medications he is noticing improvements with his depressive symptoms. Patient continues to explore a diagnosis of autism and challenges his bipolar diagnosis.  Patient utilized therapeutic space to reflect on breathing techniques addressed in previous session.  Patient identified they worked for a minute of reprieve but does not feel they addressed the cause.  For example, patient reflected on spring break and cited being off of work because anxiety.  Reflected on tension and inability to relax and misplaced guilt related to time off.  Patient identified he always feels he is getting behind and is constantly thinking about work or family.  Identified patient's pattern to feel he needs to busy himself due to racing thoughts.  Reflected on how control impacts patient's anxiety.  Patient began reflecting on childhood history and the impact of an absent father.  Patient reports he felt at 71 years old he had to  protect his mother, brother, and sister.  Reflected on ways in which this desire to protect has impacted his current life and family dynamics.  Clinician and patient briefly worked through how abandonment and disruption to attachment has impacted his anxiety and social interactions today.  Clinician worked with patient on assessing childhood trauma.  Provided brief psychoeducation on trauma and how it impacts the body as well as safe attachments.  Clinician briefly educated the patient on EMDR and patient was provided with resources to research EMDR on his own time to be further discuss in neck session.  Suicidal/Homicidal: Yeswithout intent/plan  Therapist Response:  Clinician utilized active listening to create a safe space for patient to process recent life events. Clinician  assessed for current symptoms, stressors, safety since last session.  Educated the patient on mindfulness techniques to manage his anxiety.  Continued to review assertive communication and ways in which she can establish healthy boundaries.   Plan: Return again in 2 weeks.  Diagnosis: GAD (generalized anxiety disorder)  Panic 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  Trauma and stressor-related disorder   Collaboration of Care: AEB psychiatrist can access notes and cln. Will review psychiatrists' notes. Check in with the patient and will see LCSW per availability. Patient agreed with treatment recommendations.   Patient/Guardian was advised Release of Information must be obtained prior to any record release in order to collaborate their care with an outside provider. Patient/Guardian was advised if they have not already done so to contact the registration department to sign all necessary forms in order for [place] to release information regarding their care.   Consent: Patient/Guardian gives verbal consent for treatment and assignment of benefits for services provided during this visit. Patient/Guardian expressed understanding and agreed to proceed.   Dereck Leep, LCSW 11/13/2023

## 2023-11-26 ENCOUNTER — Ambulatory Visit (INDEPENDENT_AMBULATORY_CARE_PROVIDER_SITE_OTHER): Payer: Self-pay | Admitting: Licensed Clinical Social Worker

## 2023-11-26 DIAGNOSIS — F4010 Social phobia, unspecified: Secondary | ICD-10-CM

## 2023-11-26 DIAGNOSIS — F439 Reaction to severe stress, unspecified: Secondary | ICD-10-CM | POA: Diagnosis not present

## 2023-11-26 DIAGNOSIS — F411 Generalized anxiety disorder: Secondary | ICD-10-CM

## 2023-11-26 DIAGNOSIS — F410 Panic disorder [episodic paroxysmal anxiety] without agoraphobia: Secondary | ICD-10-CM

## 2023-11-26 NOTE — Progress Notes (Signed)
 THERAPIST PROGRESS NOTE  Session Time: 10:01am-11:02am  Participation Level: Active  Behavioral Response: CasualAlertEuthymic  Type of Therapy: Individual Therapy  Treatment Goals addressed:      Goal: LTG: Matthew [name] will attend and participate in therapeutic, recreational and educational activities that support interpersonal effectiveness       Dates: Start:  09/17/23    Expected End:  03/16/24       Disciplines: Interdisciplinary, PROVIDER                             Goal: LTG: Matthew [name] will recognize socially inappropriate behaviors and develop alternative behaviors     Dates: Start:  09/17/23    Expected End:  03/16/24       Disciplines: Interdisciplinary, PROVIDER                          Goal: STG: Matthew [name] will demonstrate interest in social activities by initiating/joining social activity without prompts     Dates: Start:  09/17/23    Expected End:  03/16/24       Disciplines: Interdisciplinary, PROVIDER                 Goal: STG: Matthew [name] will identify 2 behaviors that engage in social isolation                                          Goal: LTG: Matthew [name] will score less than 5 on the Generalized Anxiety Disorder 7 Scale (GAD-7)      Dates: Start:  09/17/23    Expected End:  03/16/24       Disciplines: Interdisciplinary, PROVIDER                    Goal: STG: Report a 50% reduction in the frequency of rehearsing thoughts related to social interactions.        ProgressTowards Goals: Progressing  Interventions: CBT, Solution Focused, Assertiveness Training, Supportive, Reframing, and Other: EMDR  Summary: Matthew [name] is a 53 y.o. male who presents with low mood, lack of motivation, difficulty concentrating, restlessness, uncontrollable worry, anxious feelings, ruminating thoughts, irritability, difficulty sleeping, tension, and suicidal thoughts. Pt was oriented times 5. Pt was cooperative and engaged. Pt denies HI/AVH. Patient reports he is in the  process of coming off his medications from his bipolar diagnosis and marks improvements as he "does not feel like a zombie." Patient reports while on these medications he would often think "I do not want to be here anymore." Patient denies plan, intent, access to means. Patient also reports while coming off medications he is noticing improvements with his depressive symptoms. Patient continues to explore a diagnosis of autism and challenges his bipolar diagnosis.   The clinician began the session by assessing the patient's readiness to start EMDR treatment. The patient expressed feelings of nervousness about beginning EMDR, as he believes bringing up the past may cause him more pain. The clinician worked with the patient to limit comparisons of traumas, as the patient had struggled with identifying his traumas in previous sessions.   During the session, the patient acknowledged that he is wrestling with accepting the impact of his parents' divorce and his relationship with his father as an adult. He expressed fears that addressing past trauma could affect his current relationships. The  patient also reflected on his confusion regarding similarities in parenting styles between himself and his father, despite not having been raised by him. Additionally, he shared concerns about who he will become after completing EMDR treatment.  The clinician guided the patient in exploring the consequences of avoiding his trauma history. The patient identified that as a result of childhood experiences, he has developed high expectations for himself due to feelings of childhood rejection. He recognized that his fear of rejection and abandonment significantly impacts his adult life.   The clinician helped the patient identify negative beliefs that could be addressed in future EMDR sessions. The patient often thinks, "I am powerless/trapped," a sentiment he commonly experiences at home and work. He drew connections between these  negative beliefs and his social anxiety. The patient recounted difficulties he faced starting in junior high when he first experienced social anxiety, often feeling like he couldn't grasp "everyone's rules" for social interaction and felt unaccepted.  The clinician and patient explored how he has used safety behaviors as a form of passive avoidance in social situations, which has further entrenched his fear of rejection. The patient reflected on how social anxiety affects his current roles, acknowledging his struggle to find a safe space where he can genuinely be himself without pretending.  Continued to discuss communication difficulties within his marriage. The patient noted that he often associates his wife's communication style with his mother's, which triggers feelings of defensiveness and aggressive communication in him. The clinician provided brief psychoeducation on attachment styles, highlighting the patient's avoidant tendencies and his wife's anxious attachment style.  For homework, the patient was asked to further explore attachment styles and consider sharing his findings with his wife to improve trust and communication between them.   Suicidal/Homicidal:Yeswithout intent/plan    Therapist Response: Clinician utilized active listening to create a safe space for patient to process recent life events. Clinician assessed for current symptoms, stressors, safety since last session.  Assess for patient's readiness to begin EMDR treatment.  Continue to educate the patient on components of EMDR and identified negative cognitions to process in future sessions.  Provided brief psychoeducation on social anxiety and use of safety behaviors to avoid social interactions.  Provided brief psychoeducation on attachment styles and impact to communication.  Plan: Return again in 2 weeks.  Diagnosis: GAD (generalized anxiety disorder)  Social anxiety disorder  Panic disorder without  agoraphobia  Trauma and stressor-related disorder  Based on further assessments, it is noted that the patient exhibits symptoms that qualify him for a diagnosis of social anxiety. The patient identifies the following symptoms:  - Experiencing ongoing, intense fear or anxiety about social situations due to a belief that he may be judged negatively or humiliated by others. - Avoiding social situations that trigger anxiety or enduring them with significant fear or anxiety. - Experiencing intense anxiety that is disproportionate to the actual situation. - Experiencing anxiety and/or distress from social interactions that interfere with his daily life.  He reports that these symptoms hinder his ability to feel comfortable communicating with others at home and at work. Additionally, he experiences uncontrollable worry that prevents him from relaxing and a constant fear of rejection, which leads him to isolate himself from others.  Collaboration of Care: AEB psychiatrist can access notes and cln. Will review psychiatrists' notes. Check in with the patient and will see LCSW per availability. Patient agreed with treatment recommendations.   Patient/Guardian was advised Release of Information must be obtained prior to any record  release in order to collaborate their care with an outside provider. Patient/Guardian was advised if they have not already done so to contact the registration department to sign all necessary forms in order for [place] to release information regarding their care.   Consent: Patient/Guardian gives verbal consent for treatment and assignment of benefits for services provided during this visit. Patient/Guardian expressed understanding and agreed to proceed.   Dereck Leep, LCSW 11/26/2023
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
## 2023-12-01 ENCOUNTER — Other Ambulatory Visit (HOSPITAL_COMMUNITY): Payer: Self-pay

## 2023-12-04 ENCOUNTER — Ambulatory Visit (INDEPENDENT_AMBULATORY_CARE_PROVIDER_SITE_OTHER): Payer: Self-pay | Admitting: Psychiatry

## 2023-12-04 ENCOUNTER — Other Ambulatory Visit: Payer: Self-pay

## 2023-12-04 ENCOUNTER — Encounter: Payer: Self-pay | Admitting: Psychiatry

## 2023-12-04 VITALS — BP 115/81 | HR 83 | Temp 97.4°F | Ht 77.0 in | Wt 241.8 lb

## 2023-12-04 DIAGNOSIS — F410 Panic disorder [episodic paroxysmal anxiety] without agoraphobia: Secondary | ICD-10-CM | POA: Diagnosis not present

## 2023-12-04 DIAGNOSIS — F411 Generalized anxiety disorder: Secondary | ICD-10-CM

## 2023-12-04 DIAGNOSIS — F3181 Bipolar II disorder: Secondary | ICD-10-CM | POA: Diagnosis not present

## 2023-12-04 DIAGNOSIS — F4310 Post-traumatic stress disorder, unspecified: Secondary | ICD-10-CM | POA: Diagnosis not present

## 2023-12-04 MED ORDER — PRAZOSIN HCL 1 MG PO CAPS: 1.0000 mg | ORAL_CAPSULE | Freq: Every day | ORAL | 1 refills | Status: DC

## 2023-12-04 MED ORDER — LORAZEPAM 1 MG PO TABS: ORAL_TABLET | ORAL | 0 refills | Status: DC

## 2023-12-10 ENCOUNTER — Ambulatory Visit (INDEPENDENT_AMBULATORY_CARE_PROVIDER_SITE_OTHER): Admitting: Licensed Clinical Social Worker

## 2023-12-10 DIAGNOSIS — F4010 Social phobia, unspecified: Secondary | ICD-10-CM

## 2023-12-10 DIAGNOSIS — F411 Generalized anxiety disorder: Secondary | ICD-10-CM

## 2023-12-10 DIAGNOSIS — F4310 Post-traumatic stress disorder, unspecified: Secondary | ICD-10-CM

## 2023-12-10 DIAGNOSIS — F3181 Bipolar II disorder: Secondary | ICD-10-CM | POA: Diagnosis not present

## 2023-12-10 DIAGNOSIS — F410 Panic disorder [episodic paroxysmal anxiety] without agoraphobia: Secondary | ICD-10-CM

## 2023-12-10 NOTE — Progress Notes (Addendum)
 THERAPIST PROGRESS NOTE  Session Time: 3-3:55pm  Participation Level: Active  Behavioral Response: CasualAlertEuthymic  Type of Therapy: Individual Therapy  Treatment Goals addressed:  Goal: LTG: Matthew [name] will attend and participate in therapeutic, recreational and educational activities that support interpersonal effectiveness       Dates: Start:  09/17/23    Expected End:  03/16/24       Disciplines: Interdisciplinary, PROVIDER                             Goal: LTG: Matthew [name] will recognize socially inappropriate behaviors and develop alternative behaviors     Dates: Start:  09/17/23    Expected End:  03/16/24       Disciplines: Interdisciplinary, PROVIDER                             Goal: STG: Matthew [name] will demonstrate interest in social activities by initiating/joining social activity without prompts     Dates: Start:  09/17/23    Expected End:  03/16/24       Disciplines: Interdisciplinary, PROVIDER                    Goal: STG: Matthew [name] will identify 2 behaviors that engage in social isolation                                                                  Goal: LTG: Matthew [name] will score less than 5 on the Generalized Anxiety Disorder 7 Scale (GAD-7)      Dates: Start:  09/17/23    Expected End:  03/16/24       Disciplines: Interdisciplinary, PROVIDER                     Goal: STG: Report a 50% reduction in the frequency of rehearsing thoughts related to social interactions.            ProgressTowards Goals: Progressing  Interventions: CBT, Assertiveness Training, Supportive, and Reframing  Summary: Matthew [name] is a 53 y.o. male who presents with low mood, lack of motivation, difficulty concentrating, restlessness, uncontrollable worry, anxious feelings, ruminating thoughts, irritability, difficulty sleeping, tension, and suicidal thoughts. Pt was oriented times 5. Pt was cooperative and engaged. Pt denies HI/AVH. Patient reports he is in the process of  coming off his medications from his bipolar diagnosis and marks improvements as he "does not feel like a zombie." Patient reports while on these medications he would often think "I do not want to be here anymore." Patient denies plan, intent, access to means. Patient also reports while coming off medications he is noticing improvements with his depressive symptoms. Patient continues to explore a diagnosis of autism and challenges his bipolar diagnosis.   The patient began the therapeutic session by processing homework from the previous session, which involved research on attachment styles. He identified with the disorganized attachment style and noted that he feels his wife exhibits an anxious attachment style. Discussed how the patient is applying this newfound understanding of attachment to improve communication within his marriage.   The patient explained how he is working collaboratively with his wife by using "we" statements during  their discussions. He also shared insights about his realization of how certain behaviors relate to trauma responses. Together, they reflected on alternative behaviors in light of recent disagreements, and the patient identified strategies for establishing patterns of assertive communication.  Additionally, the patient continues to reflect on the significant impact of understanding fight, flight, and freeze trauma responses. He noted that a better understanding of his fear of rejection has helped him navigate his emotions more effectively. The patient specified particular anxiety-provoking situations he has managed to overcome by reflecting on his fight, flight, and freeze responses.  The patient also reflected on his relationship with his daughter, identifying COVID-19 as a pivotal moment that changed their dynamic. He acknowledged that her diagnosis of social anxiety has hindered her ability to connect with anyone in the household. Discussed potential efforts he could make  to connect with his daughter, particularly in relation to their shared experience of social anxiety.  The clinician continues to provide the patient with information on social anxiety and safety behaviors to review between sessions.   Suicidal/Homicidal: Yeswithout intent/plan   Therapist Response: Clinician utilized active listening to create a safe space for patient to process recent life events. Clinician assessed for current symptoms, stressors, safety since last session.  Continue to work with patient on connecting trauma responses to current dynamics.  Explored how patient can continue to establish healthier boundaries through use of assertive communication.  Continue to provide psychoeducation around social anxiety.  Plan: Return again in 2 weeks.  Diagnosis: GAD (generalized anxiety disorder)  Social anxiety disorder  Panic 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  PTSD (post-traumatic stress disorder)    Collaboration of Care: AEB psychiatrist can access notes and cln. Will review psychiatrists' notes. Check in with the patient and will see LCSW per availability. Patient agreed with treatment recommendations.   Patient/Guardian was advised Release of Information must be obtained prior to any record release in order to collaborate their care with an outside provider. Patient/Guardian was advised if they have not already done so to contact the registration department to sign all necessary forms in order for us  to release information regarding their care.   Consent: Patient/Guardian gives verbal consent for treatment and assignment of benefits for services provided during this visit. Patient/Guardian expressed understanding and agreed to proceed.   Matthew Slot, LCSW 12/10/2023

## 2023-12-25 ENCOUNTER — Ambulatory Visit: Admitting: Licensed Clinical Social Worker

## 2024-01-05 ENCOUNTER — Ambulatory Visit: Admitting: Nurse Practitioner

## 2024-01-08 ENCOUNTER — Other Ambulatory Visit: Payer: Self-pay | Admitting: Psychiatry

## 2024-01-08 ENCOUNTER — Ambulatory Visit (INDEPENDENT_AMBULATORY_CARE_PROVIDER_SITE_OTHER): Admitting: Licensed Clinical Social Worker

## 2024-01-08 DIAGNOSIS — F4010 Social phobia, unspecified: Secondary | ICD-10-CM | POA: Diagnosis not present

## 2024-01-08 DIAGNOSIS — F4310 Post-traumatic stress disorder, unspecified: Secondary | ICD-10-CM

## 2024-01-08 DIAGNOSIS — F410 Panic disorder [episodic paroxysmal anxiety] without agoraphobia: Secondary | ICD-10-CM

## 2024-01-08 DIAGNOSIS — F411 Generalized anxiety disorder: Secondary | ICD-10-CM

## 2024-01-08 NOTE — Progress Notes (Signed)
 THERAPIST PROGRESS NOTE  Session Time: 11:07-12pm  Participation Level: Active  Behavioral Response: CasualAlertEuthymic  Type of Therapy: Individual Therapy   Goal: LTG: Matthew [name] will attend and participate in therapeutic, recreational and educational activities that support interpersonal effectiveness       Dates: Start:  09/17/23    Expected End:  03/16/24       Disciplines: Interdisciplinary, PROVIDER                             Goal: LTG: Matthew [name] will recognize socially inappropriate behaviors and develop alternative behaviors     Dates: Start:  09/17/23    Expected End:  03/16/24       Disciplines: Interdisciplinary, PROVIDER                                 Goal: STG: Matthew [name] will demonstrate interest in social activities by initiating/joining social activity without prompts     Dates: Start:  09/17/23    Expected End:  03/16/24       Disciplines: Interdisciplinary, PROVIDER                        Goal: STG: Matthew [name] will identify 2 behaviors that engage in social isolation                                                                                                Goal: LTG: Matthew [name] will score less than 5 on the Generalized Anxiety Disorder 7 Scale (GAD-7)      Dates: Start:  09/17/23    Expected End:  03/16/24       Disciplines: Interdisciplinary, PROVIDER                      Goal: STG: Report a 50% reduction in the frequency of rehearsing thoughts related to social interactions.             ProgressTowards Goals: Progressing   Interventions: CBT, Assertiveness Training, Supportive, and Reframing   Summary: Matthew [name] is a 53 y.o. male who presents with low mood, lack of motivation, difficulty concentrating, restlessness, uncontrollable worry, anxious feelings, ruminating thoughts, irritability, difficulty sleeping, tension, and suicidal thoughts. Pt was oriented times 5. Pt was cooperative and engaged. Pt denies HI/AVH. Patient reports he is  in the process of coming off his medications from his bipolar diagnosis and marks improvements as he "does not feel like a zombie." Patient reports while on these medications he would often think "I do not want to be here anymore." Patient denies plan, intent, access to means. Patient also reports while coming off medications he is noticing improvements with his depressive symptoms. Patient continues to explore a diagnosis of autism and challenges his bipolar diagnosis.   Patient utilized therapeutic space to process recent break from therapy citing he had a few bad weeks and good weeks.  Patient identified a few "bad" weeks to be a result of ruminating over  memories from childhood that have come up in session.  Clinician worked with patient on identifying possible triggers with patient struggling to recall patterns.    Patient also reflected on communication within his marriage citing defensiveness and misunderstandings as a result of possible trauma responses from parties in the marriage.  Clinician and patient role-played ways in which he can converse with his wife regarding certain patterns he has observed.  Established patterns for patient to often personalize events outside of his control as a result of his own trauma.  Patient continues to process life events in which he feels furtherance or with a negative belief "I am a failure."  For homework, patient will begin to take note of negative life events he ruminates over as well as frequent negative cognitions.  Suicidal/Homicidal: Yeswithout intent/plan   Therapist Response: Clinician utilized active listening to create a safe space for patient to process recent life events. Clinician assessed for current symptoms, stressors, safety since last session.  Clinician and patient continue to explore the impact of trauma on life events as well as interpersonal relationships.  Utilized CBT to continue to aid in patient's reframing of negative  cognitions.  Plan: Return again in 2 weeks.  Diagnosis: GAD (generalized anxiety disorder)  Social anxiety disorder  Panic 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  PTSD (post-traumatic stress disorder)   Collaboration of Care: AEB psychiatrist can access notes and cln. Will review psychiatrists' notes. Check in with the patient and will see LCSW per availability. Patient agreed with treatment recommendations.   Patient/Guardian was advised Release of Information must be obtained prior to any record release in order to collaborate their care with an outside provider. Patient/Guardian was advised if they have not already done so to contact the registration department to sign all necessary forms in order for us  to release information regarding their care.   Consent: Patient/Guardian gives verbal consent for treatment and assignment of benefits for services provided during this visit. Patient/Guardian expressed understanding and agreed to proceed.   Marvin Slot, LCSW 01/08/2024
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
## 2024-01-15 ENCOUNTER — Other Ambulatory Visit: Payer: Self-pay

## 2024-01-15 ENCOUNTER — Ambulatory Visit (INDEPENDENT_AMBULATORY_CARE_PROVIDER_SITE_OTHER): Admitting: Psychiatry

## 2024-01-15 ENCOUNTER — Encounter: Payer: Self-pay | Admitting: Psychiatry

## 2024-01-15 VITALS — BP 125/86 | HR 91 | Temp 96.4°F | Ht 77.0 in | Wt 238.0 lb

## 2024-01-15 DIAGNOSIS — F410 Panic disorder [episodic paroxysmal anxiety] without agoraphobia: Secondary | ICD-10-CM | POA: Diagnosis not present

## 2024-01-15 DIAGNOSIS — F4310 Post-traumatic stress disorder, unspecified: Secondary | ICD-10-CM | POA: Diagnosis not present

## 2024-01-15 DIAGNOSIS — F3181 Bipolar II disorder: Secondary | ICD-10-CM | POA: Diagnosis not present

## 2024-01-15 DIAGNOSIS — F411 Generalized anxiety disorder: Secondary | ICD-10-CM

## 2024-01-15 MED ORDER — BUSPIRONE HCL 15 MG PO TABS: ORAL_TABLET | ORAL | 1 refills | Status: DC

## 2024-01-15 MED ORDER — CLONIDINE HCL 0.1 MG PO TABS: 0.1000 mg | ORAL_TABLET | Freq: Every day | ORAL | 1 refills | Status: DC

## 2024-01-21 ENCOUNTER — Ambulatory Visit (INDEPENDENT_AMBULATORY_CARE_PROVIDER_SITE_OTHER): Admitting: Licensed Clinical Social Worker

## 2024-01-21 DIAGNOSIS — Z91199 Patient's noncompliance with other medical treatment and regimen due to unspecified reason: Secondary | ICD-10-CM

## 2024-01-21 NOTE — Progress Notes (Signed)
 Clinician attempted session via face-to-face, but Matthew [name] did not appear for his session. Patient will be charged a no show fee per [facility] policy.

## 2024-01-26 DIAGNOSIS — Z720 Tobacco use: Secondary | ICD-10-CM | POA: Diagnosis not present

## 2024-01-26 DIAGNOSIS — I10 Essential (primary) hypertension: Secondary | ICD-10-CM | POA: Diagnosis not present

## 2024-01-26 DIAGNOSIS — I2119 ST elevation (STEMI) myocardial infarction involving other coronary artery of inferior wall: Secondary | ICD-10-CM | POA: Diagnosis not present

## 2024-01-30 ENCOUNTER — Telehealth: Payer: Self-pay | Admitting: Psychiatry

## 2024-01-30 ENCOUNTER — Other Ambulatory Visit: Payer: Self-pay | Admitting: Psychiatry

## 2024-01-30 DIAGNOSIS — F410 Panic disorder [episodic paroxysmal anxiety] without agoraphobia: Secondary | ICD-10-CM

## 2024-01-30 DIAGNOSIS — F411 Generalized anxiety disorder: Secondary | ICD-10-CM

## 2024-01-30 NOTE — Telephone Encounter (Signed)
 Please inform him that the medication has been sent to the pharmacy.

## 2024-01-30 NOTE — Telephone Encounter (Signed)
 Pt.notified

## 2024-02-04 ENCOUNTER — Ambulatory Visit (INDEPENDENT_AMBULATORY_CARE_PROVIDER_SITE_OTHER): Admitting: Licensed Clinical Social Worker

## 2024-02-04 DIAGNOSIS — F4310 Post-traumatic stress disorder, unspecified: Secondary | ICD-10-CM

## 2024-02-04 DIAGNOSIS — F4010 Social phobia, unspecified: Secondary | ICD-10-CM

## 2024-02-04 DIAGNOSIS — F411 Generalized anxiety disorder: Secondary | ICD-10-CM | POA: Diagnosis not present

## 2024-02-04 DIAGNOSIS — F410 Panic disorder [episodic paroxysmal anxiety] without agoraphobia: Secondary | ICD-10-CM | POA: Diagnosis not present

## 2024-02-04 NOTE — Progress Notes (Signed)
 THERAPIST PROGRESS NOTE  Session Time: 3-3:59am  Participation Level: Active  Behavioral Response: CasualAlertEuthymic  Type of Therapy: Individual Therapy  Goal: LTG: Matthew [name] will attend and participate in therapeutic, recreational and educational activities that support interpersonal effectiveness       Dates: Start:  09/17/23    Expected End:  03/16/24       Disciplines: Interdisciplinary, PROVIDER                             Goal: LTG: Matthew [name] will recognize socially inappropriate behaviors and develop alternative behaviors     Dates: Start:  09/17/23    Expected End:  03/16/24       Disciplines: Interdisciplinary, PROVIDER                                     Goal: STG: Matthew [name] will demonstrate interest in social activities by initiating/joining social activity without prompts     Dates: Start:  09/17/23    Expected End:  03/16/24       Disciplines: Interdisciplinary, PROVIDER                            Goal: STG: Matthew [name] will identify 2 behaviors that engage in social isolation                                                                                                                                   Goal: LTG: Matthew [name] will score less than 5 on the Generalized Anxiety Disorder 7 Scale (GAD-7)      Dates: Start:  09/17/23    Expected End:  03/16/24       Disciplines: Interdisciplinary, PROVIDER                      Goal: STG: Report a 50% reduction in the frequency of rehearsing thoughts related to social interactions.             ProgressTowards Goals: Progressing   Interventions: CBT, Assertiveness Training, Supportive, and Reframing   Summary: Matthew [name] is a 53 y.o. male who presents with low mood, lack of motivation, difficulty concentrating, restlessness, uncontrollable worry, anxious feelings, ruminating thoughts, irritability, difficulty sleeping, tension, and suicidal thoughts. Pt was oriented times 5. Pt was cooperative and engaged.  Pt denies HI/AVH. Patient reports he is in the process of coming off his medications from his bipolar diagnosis and marks improvements as he does not feel like a zombie. Patient reports while on these medications he would often think I do not want to be here anymore. Patient denies plan, intent, access to means. Patient also reports while coming off medications he is noticing improvements with his depressive symptoms. Patient continues to explore a diagnosis of  autism and challenges his bipolar diagnosis.   Patient utilized therapeutic space to process recent triggers citing he was at a conference where speakers remarks angered him.  Patient reports he experienced anger replaying the encounter 4 days after the situation passed.  He reports this experience triggered anger and resentment from previous job as a result of leadership changes. In the moment he felt disrespected and identified barriers to developing trust.  Patient identified he coped through substance use as a way to forget the experience.  Patient continued to process life events that are further instilling the belief I am a failure.  He identified specific parenting difficulties and ways in which he is working towards communicating consequences.  Patient also processed barriers to communication within his marriage citing he feels taken advantage of and is fearful of a another heart attack.  Despite recent events, patient reports he has made efforts to engage in coping skills such as fishing, spending quality time with his partner, and engaging in other hobbies.  Clinician and patient worked together to establish ways he can effectively communicate his feelings with his family regarding the impact of disrespect and distress amongst relatives.  Clinician also encouraged the patient and his wife to construct a list of positive qualities that they both appreciate about each other and establish a routine to prioritize one-on-one time with each  other outside of the family dynamics.  Patient agreed.  Suicidal/Homicidal: Yeswithout intent/plan    Therapist Response: Clinician utilized active listening to create a safe space for patient to process recent life events. Clinician assessed for current symptoms, stressors, safety since last session.  Explored with the patient ways in which he is coping with his anger and ways in which he can effectively communicate even when in an angry state.  Utilized CBT to continue to aid in patient's reframing of negative cognitions.  Plan: Return again in 2 weeks.  Diagnosis: GAD (generalized anxiety disorder)  Social anxiety disorder  PTSD (post-traumatic stress disorder)  Panic 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   Collaboration of Care: AEB psychiatrist can access notes and cln. Will review psychiatrists' notes. Check in with the patient and will see LCSW per availability. Patient agreed with treatment recommendations.   Patient/Guardian was advised Release of Information must be obtained prior to any record release in order to collaborate their care with an outside provider. Patient/Guardian was advised if they have not already done so to contact the registration department to sign all necessary forms in order for us  to release information regarding their care.   Consent: Patient/Guardian gives verbal consent for treatment and assignment of benefits for services provided during this visit. Patient/Guardian expressed understanding and agreed to proceed.   Marvin Slot, LCSW 02/04/2024

## 2024-02-08 ENCOUNTER — Other Ambulatory Visit: Payer: Self-pay | Admitting: Psychiatry

## 2024-02-11 ENCOUNTER — Other Ambulatory Visit: Payer: Self-pay | Admitting: Psychiatry

## 2024-02-18 ENCOUNTER — Ambulatory Visit (INDEPENDENT_AMBULATORY_CARE_PROVIDER_SITE_OTHER): Admitting: Licensed Clinical Social Worker

## 2024-02-18 DIAGNOSIS — F411 Generalized anxiety disorder: Secondary | ICD-10-CM

## 2024-02-18 DIAGNOSIS — F4310 Post-traumatic stress disorder, unspecified: Secondary | ICD-10-CM

## 2024-02-18 DIAGNOSIS — F410 Panic disorder [episodic paroxysmal anxiety] without agoraphobia: Secondary | ICD-10-CM | POA: Diagnosis not present

## 2024-02-18 DIAGNOSIS — F4010 Social phobia, unspecified: Secondary | ICD-10-CM | POA: Diagnosis not present

## 2024-02-18 NOTE — Progress Notes (Signed)
 THERAPIST PROGRESS NOTE  Progress Review  Session Time: 3:03pm-4pm  Participation Level: Active  Behavioral Response: CasualAlertDepressed  Type of Therapy: Individual Therapy  Treatment Goals addressed:  Active     Anxiety     LTG: Matthew [name] will score less than 5 on the Generalized Anxiety Disorder 7 Scale (GAD-7)  (Not Progressing)     Start:  09/17/23    Expected End:  03/16/24       Goal Note     02/18/24: Patient scored a 16 on his GAD7.          STG: Report a 50% reduction in the frequency of rehearsing thoughts related to social interactions. (Progressing)     Start:  09/17/23    Expected End:  03/16/24       Goal Note     02/18/24: Notes improvement. Shares psycho education of trauma responses was helpful as well as identifying his fear of rejection and failure.          Perform psychoeducation regarding anxiety disorders     Start:  09/17/23         Work with patient individually to identify the major components of a recent episode of anxiety: physical symptoms, major thoughts and images, and major behaviors they experienced     Start:  09/17/23         Coping Skills     Start:  09/17/23       Will work with the pt using CBT/DBT techniques to help the pt verbalize an understanding of the cognitive, physiological, and behavioral components of anxiety and its treatment. This will be done by using worksheets, interactive activities, CBT/ABC thought logs, modeling, homework, role playing and journaling. Will work with pt to learn and implement coping skills that result in a reduction of anxiety and improve daily functioning per pt self report 3 out of 5 documented sessions.       Participating in weekly Cognitive-Behavioral Therapy (CBT) sessions to focus on cognitive restructuring and address irrational fears.       Start:  09/17/23            Identify and challenge negative thoughts: Recognize distorted or unhelpful thinking patterns and  substitute them with more balanced and constructive alternatives.       Start:  09/17/23              Social Interpersonal Effectiveness     LTG: Matthew [name] will attend and participate in therapeutic, recreational and educational activities that support interpersonal effectiveness   (Progressing)     Start:  09/17/23    Expected End:  03/16/24         LTG: Matthew [name] will recognize socially inappropriate behaviors and develop alternative behaviors (Progressing)     Start:  09/17/23    Expected End:  03/16/24         STG: Matthew [name] will demonstrate interest in social activities by initiating/joining social activity without prompts (Progressing)     Start:  09/17/23    Expected End:  03/16/24       Goal Note     02/18/24: I think I'm a little less avoidant.          STG: Matthew [name] will identify 2 behaviors that engage in social isolation  (Progressing)     Start:  09/17/23    Expected End:  03/16/24       Goal Note     02/18/24: I've gotten better about going and doing stuck rather than  not doing it because of anxiety.          Educate Matthew [name] on appropriate behaviors and boundaries     Start:  09/17/23         Facilitate discussions with [place] regarding miscommunications that occur during social interactions     Start:  09/17/23         Educate Ein on aggressive, passive,and assertive communication styles     Start:  09/17/23            ProgressTowards Goals: Progressing  Interventions: CBT, Supportive, and Reframing  Summary: Matthew [name] is a 53 y.o. male who presents with low mood, lack of motivation, difficulty concentrating, restlessness, uncontrollable worry, anxious feelings, ruminating thoughts, irritability, difficulty sleeping, tension, and suicidal thoughts. Pt was oriented times 5. Pt was cooperative and engaged. Pt denies SI/HI/AVH.  Cln utilized the first half of session to review patients progress. See progress notes documented above.    The clinician readministered the PHQ-9 and GAD-7 assessments. The patient's anxiety scores decreased from 21 to 16, and depression scores also decreased from 21 to 10.   The patient presented to the session today feeling anxious, as evidenced by their restlessness and noticeable deep breathing patterns. Hereported experiencing stress due to work and feeling emotionally overwhelmed. The patient described an increase in sleep issues, specifically difficulty falling asleep. He mentioned waking up each morning struggling with negative thoughts and unhelpful memories from the past. The patient continues to experience uncontrollable worry, anxious feelings, anhedonia, fatigue, difficulty concentrating, and irritability.  During the session, the patient reflected on his improved ability to manage work-related stress, noting that he is getting better at controlling his reactions and letting things go. He also discussed conversations from previous sessions with his family, acknowledging that these discussions have contributed to his improved emotional state. However, the patient expressed feeling emotionally overwhelmed as he is deeply invested in his children's lives and happiness, along with fears that his children may not succeed based on their current behaviors.  The patient identified coping strategies but admitted to feeling out of control, which conflicts with his desire to relinquish control. He expressed specific panic regarding a fear of dying, partially due to a recent health decline linked to his medical history. Despite this, the patient is actively working towards acceptance and identifying controllable factors regarding his health. Additionally, he reported that his job serves as a stressor, making him anxious because he feels the need to perform.  Suicidal/Homicidal: No without intent/plan   Therapist Response: During the session, the clinician employed active listening to create a safe  environment for the patient to process recent life events. The clinician assessed the patient's current symptoms, stressors, and safety since the last session.   The clinician assisted the patient in processing his feelings, providing brief psychoeducation on the differences between good and bad stress and how these factors impact his anxiety. Throughout the session, the patient exhibited a low mood, was tearful, and spoke softly. The clinician also noted the patient's anxiety, characterized by restlessness and avoidant eye contact.   To further explore the patient's feelings, the clinician utilized a values exploration exercise, prompting the patient to identify his core values, which included legacy, balance, belonging, and acceptance. The patient defined these values in terms of being a good man and a good father. He became tearful while reflecting on how he interprets others' responses to him, which leads him to feel like a bad person.   The clinician  continued to help the patient understand how these feelings influence his behavior, using the CBT triangle to discuss behavioral patterns that may be exacerbating his anxiety.   The patient continues to struggle with depressive and anxious symptoms, which make it challenging for him to engage socially at home and work. He requires extensive Socratic questioning and support with reframing his feelings of guilt and shame. The patient denied suicidal ideation, homicidal ideation, and auditory/visual hallucinations. However, he is making progress toward his goal of reducing social isolation and limiting anxious thoughts.  Plan: Clinician will continue to use CBT and form treatment to help the patient learn techniques to manage and reframe negative cognitions increasing anxious symptoms, specifically guilt.  For the next week, the patient was asked to reflect on his values exercise and ways in which he may or may not be satisfied in various areas of his life.   Clinician recommends meeting in 2 weeks.  Diagnosis: GAD (generalized anxiety disorder)  Social anxiety disorder  PTSD (post-traumatic stress disorder)  Panic 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   Collaboration of Care: AEB psychiatrist can access notes and cln. Will review psychiatrists' notes. Check in with the patient and will see LCSW per availability. Patient agreed with treatment recommendations.   Patient/Guardian was advised Release of Information must be obtained prior to any record release in order to collaborate their care with an outside provider. Patient/Guardian was advised if they have not already done so to contact the registration department to sign all necessary forms in order for us  to release information regarding their care.   Consent: Patient/Guardian gives verbal consent for treatment and assignment of benefits for services provided during this visit. Patient/Guardian expressed understanding and agreed to proceed.   Evalene KATHEE Husband, LCSW 02/18/2024

## 2024-02-26 ENCOUNTER — Telehealth: Payer: Self-pay | Admitting: Psychiatry

## 2024-02-26 NOTE — Telephone Encounter (Signed)
 Patient stopped by the office to ask if he can take a medication, Caplita, since he had the heart attact? You had prescribed it and then he had the heart attack back in March.  Please call to advise

## 2024-02-26 NOTE — Telephone Encounter (Signed)
 Talked with him.  He reports feeling overwhelmed and inquires about restarting Caplyta . He denies any SI.  It is advised not to restart this medication given his previous adverse reaction of SI, and he voiced understanding.  He has not yet initiated clonidine , which was previously recommended to address hyperarousal symptoms. Risks of clonidine , including potential bradycardia and hypotension, were reviewed again. He expressed understanding and agreed to reschedule for an earlier follow-up appointment next Monday.
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
## 2024-03-01 ENCOUNTER — Encounter: Payer: Self-pay | Admitting: Psychiatry

## 2024-03-01 ENCOUNTER — Other Ambulatory Visit: Payer: Self-pay

## 2024-03-01 ENCOUNTER — Ambulatory Visit (INDEPENDENT_AMBULATORY_CARE_PROVIDER_SITE_OTHER): Admitting: Psychiatry

## 2024-03-01 VITALS — BP 136/91 | HR 94 | Temp 97.8°F | Ht 77.0 in | Wt 232.8 lb

## 2024-03-01 DIAGNOSIS — F4310 Post-traumatic stress disorder, unspecified: Secondary | ICD-10-CM | POA: Diagnosis not present

## 2024-03-01 DIAGNOSIS — F410 Panic disorder [episodic paroxysmal anxiety] without agoraphobia: Secondary | ICD-10-CM | POA: Diagnosis not present

## 2024-03-01 DIAGNOSIS — F411 Generalized anxiety disorder: Secondary | ICD-10-CM | POA: Diagnosis not present

## 2024-03-01 DIAGNOSIS — F3181 Bipolar II disorder: Secondary | ICD-10-CM | POA: Diagnosis not present

## 2024-03-01 MED ORDER — LUMATEPERONE TOSYLATE 10.5 MG PO CAPS: 10.5000 mg | ORAL_CAPSULE | ORAL | 0 refills | Status: AC

## 2024-03-04 ENCOUNTER — Ambulatory Visit: Admitting: Psychiatry

## 2024-03-09 ENCOUNTER — Other Ambulatory Visit: Payer: Self-pay | Admitting: Psychiatry

## 2024-03-09 ENCOUNTER — Emergency Department (HOSPITAL_COMMUNITY)

## 2024-03-09 ENCOUNTER — Encounter (HOSPITAL_COMMUNITY): Payer: Self-pay

## 2024-03-09 ENCOUNTER — Other Ambulatory Visit: Payer: Self-pay

## 2024-03-09 ENCOUNTER — Emergency Department (HOSPITAL_COMMUNITY)
Admission: EM | Admit: 2024-03-09 | Discharge: 2024-03-10 | Disposition: A | Discharge status: Home / Self Care | Attending: Emergency Medicine | Admitting: Emergency Medicine

## 2024-03-09 DIAGNOSIS — R202 Paresthesia of skin: Secondary | ICD-10-CM | POA: Insufficient documentation

## 2024-03-09 DIAGNOSIS — F17210 Nicotine dependence, cigarettes, uncomplicated: Secondary | ICD-10-CM | POA: Diagnosis not present

## 2024-03-09 DIAGNOSIS — R0789 Other chest pain: Secondary | ICD-10-CM | POA: Diagnosis not present

## 2024-03-09 DIAGNOSIS — I2510 Atherosclerotic heart disease of native coronary artery without angina pectoris: Secondary | ICD-10-CM | POA: Diagnosis not present

## 2024-03-09 DIAGNOSIS — Z7982 Long term (current) use of aspirin: Secondary | ICD-10-CM | POA: Insufficient documentation

## 2024-03-09 DIAGNOSIS — Z0389 Encounter for observation for other suspected diseases and conditions ruled out: Secondary | ICD-10-CM | POA: Diagnosis not present

## 2024-03-09 DIAGNOSIS — R079 Chest pain, unspecified: Secondary | ICD-10-CM | POA: Insufficient documentation

## 2024-03-09 DIAGNOSIS — J4489 Other specified chronic obstructive pulmonary disease: Secondary | ICD-10-CM | POA: Diagnosis not present

## 2024-03-09 DIAGNOSIS — G459 Transient cerebral ischemic attack, unspecified: Secondary | ICD-10-CM | POA: Diagnosis not present

## 2024-03-09 DIAGNOSIS — I10 Essential (primary) hypertension: Secondary | ICD-10-CM | POA: Diagnosis not present

## 2024-03-09 DIAGNOSIS — M549 Dorsalgia, unspecified: Secondary | ICD-10-CM | POA: Diagnosis not present

## 2024-03-09 LAB — BASIC METABOLIC PANEL WITH GFR
Anion gap: 11 (ref 5–15)
BUN: 6 mg/dL (ref 6–20)
CO2: 23 mmol/L (ref 22–32)
Calcium: 9.3 mg/dL (ref 8.9–10.3)
Chloride: 104 mmol/L (ref 98–111)
Creatinine, Ser: 0.79 mg/dL (ref 0.61–1.24)
GFR, Estimated: >60 mL/min (ref >60)
Glucose, Bld: 111 mg/dL — ABNORMAL HIGH (ref 70–99)
Potassium: 3.6 mmol/L (ref 3.5–5.1)
Sodium: 138 mmol/L (ref 135–145)

## 2024-03-09 LAB — CBC
HCT: 41.5 % (ref 39.0–52.0)
Hemoglobin: 15.3 g/dL (ref 13.0–17.0)
MCH: 33.6 pg (ref 26.0–34.0)
MCHC: 36.9 g/dL — ABNORMAL HIGH (ref 30.0–36.0)
MCV: 91.2 fL (ref 80.0–100.0)
Platelets: 229 K/uL (ref 150–400)
RBC: 4.55 MIL/uL (ref 4.22–5.81)
RDW: 12.1 % (ref 11.5–15.5)
WBC: 9.1 K/uL (ref 4.0–10.5)
nRBC: 0 % (ref 0.0–0.2)

## 2024-03-09 LAB — TROPONIN I (HIGH SENSITIVITY)
Troponin I (High Sensitivity): 8 ng/L (ref <18)
Troponin I (High Sensitivity): 9 ng/L (ref <18)

## 2024-03-09 MED ORDER — MORPHINE SULFATE (PF) 4 MG/ML IV SOLN
4.0000 mg | Freq: Once | INTRAVENOUS | Status: AC
  Administered 2024-03-09: 4 mg via INTRAVENOUS
  Filled 2024-03-09: qty 1

## 2024-03-09 MED ORDER — KETOROLAC TROMETHAMINE 15 MG/ML IJ SOLN
15.0000 mg | Freq: Once | INTRAMUSCULAR | Status: AC
  Administered 2024-03-09: 15 mg via INTRAVENOUS
  Filled 2024-03-09: qty 1

## 2024-03-09 MED ORDER — IOHEXOL 350 MG/ML SOLN
100.0000 mL | Freq: Once | INTRAVENOUS | Status: AC | PRN
  Administered 2024-03-09: 100 mL via INTRAVENOUS

## 2024-03-09 NOTE — ED Provider Triage Note (Signed)
 Emergency Medicine Provider Triage Evaluation Note  Matthew [name] , a 53 y.o. male  was evaluated in triage.  Pt complains of chest pain with radiation to the neck, left arm, during exertion. Hx of MI in march. Rates pain 5/10. Hx tobacco abuse, anxiety, bipolar disorder, htn  Review of Systems  Positive: Chest pain Negative:   Physical Exam  BP (!) 143/96 (BP Location: Right Arm)   Pulse 93   Temp 98.2 F (36.8 C) (Oral)   Resp 20   Ht 6' 5 (1.956 m)   Wt 106.6 kg   SpO2 97%   BMI 27.87 kg/m  Gen:   Awake, no distress   Resp:  Normal effort  MSK:   Moves extremities without difficulty  Other:    Medical Decision Making  Medically screening exam initiated at 8:36 PM.  Appropriate orders placed.  Matthew [name] was informed that the remainder of the evaluation will be completed by another provider, this initial triage assessment does not replace that evaluation, and the importance of remaining in the ED until their evaluation is complete.  Workup initiated in triage    Rosan Sherlean DEL, [place] 03/09/24 2036

## 2024-03-09 NOTE — ED Provider Notes (Addendum)
 [place] EMERGENCY DEPARTMENT AT [facility] Provider Note  CSN: [id number] Arrival date & time: 03/09/24 1947  Chief Complaint(s) Chest Pain  HPI Matthew [name] is a 53 y.o. male here today with chest pain.  Patient had an MI in February of this year.  Says that he was riding on his lawn more, began to have pain in his chest with radiation to his back and up his neck, which he says felt similar to when he previously had his MI.   Past Medical History Past Medical History:  Diagnosis Date   Acute kidney failure (HCC) 01/2012   Anxiety    Bipolar disorder (HCC)    hypomania   COPD (chronic obstructive pulmonary disease) (HCC)    Depression    Hyperlipidemia    Hypertension    Low back pain    Patient Active Problem List   Diagnosis Date Noted   History of MI (myocardial infarction) 11/10/2023   Coronary artery disease 10/19/2023   Atrial tachycardia (HCC) 10/19/2023   Chest pain 10/19/2023   Essential hypertension 10/19/2023   Anxiety and depression 10/15/2023   GAD (generalized anxiety disorder) 02/16/2019   Panic 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 07/07/2014   Bipolar 2 disorder, major depressive episode (HCC) 02/03/2014   Low back pain 05/28/2012   Scoliosis of thoracic spine 03/12/2012   Back pain, thoracic 03/12/2012   Other abnormal glucose 03/12/2012   Nonspecific elevation of levels of transaminase or lactic acid dehydrogenase (LDH) 02/02/2012   Tobacco abuse 01/26/2012   Asthma 01/26/2012   Routine general medical examination at a health care facility 01/21/2011   Home Medication(s) Prior to Admission medications   Medication Sig Start Date End Date Taking? Authorizing Provider  aspirin  81 MG chewable tablet Chew 1 tablet (81 mg total) by mouth daily. 11/10/23   Pender, Julie F, FNP  atorvastatin  (LIPITOR ) 80 MG tablet Take 1 tablet (80 mg total) by mouth daily. 11/10/23   Pender, Julie F, FNP  busPIRone  (BUSPAR ) 15 MG tablet Take 0.5 tablets (7.5  mg total) by mouth daily AND 1 tablet (15 mg total) every evening. 02/08/24 05/08/24  Vickey Mettle, MD  lisinopril  (ZESTRIL ) 2.5 MG tablet Take 1 tablet (2.5 mg total) by mouth at bedtime. 11/10/23   Pender, Julie F, FNP  LORazepam  (ATIVAN ) 1 MG tablet May take 1 tablet (1 mg total) by mouth 2 (two) times daily as needed for anxiety. May also take 1 tablet (1 mg total) every other day as needed for anxiety. 01/30/24 03/30/24  Hisada, Reina, MD  lumateperone  tosylate (CAPLYTA ) 10.5 MG capsule Take 1 capsule (10.5 mg total) by mouth every other day. 03/01/24 03/31/24  Vickey Mettle, MD  metoprolol  succinate (TOPROL -XL) 25 MG 24 hr tablet Take 0.5 tablets (12.5 mg total) by mouth daily. 11/10/23   Gareth Mliss FALCON, FNP  prasugrel  (EFFIENT ) 10 MG TABS tablet Take 1 tablet (10 mg total) by mouth daily. 11/10/23   Pender, Julie F, FNP  prazosin  (MINIPRESS ) 1 MG capsule Take 1 capsule (1 mg total) by mouth at bedtime. 12/04/23 02/02/24  Vickey Mettle, MD  Past Surgical History Past Surgical History:  Procedure Laterality Date   CORONARY/GRAFT ACUTE MI REVASCULARIZATION N/A 10/15/2023   Procedure: Coronary/Graft Acute MI Revascularization;  Surgeon: Ammon Blunt, MD;  Location: [facility] INVASIVE CV LAB;  Service: Cardiovascular;  Laterality: N/A;   LEFT HEART CATH AND CORONARY ANGIOGRAPHY N/A 10/15/2023   Procedure: LEFT HEART CATH AND CORONARY ANGIOGRAPHY;  Surgeon: Ammon Blunt, MD;  Location: [facility] INVASIVE CV LAB;  Service: Cardiovascular;  Laterality: N/A;   NO PAST SURGERIES     Family History Family History  Problem Relation Age of Onset   Anxiety disorder Mother    Alcohol abuse Father    Drug abuse Father    Bipolar disorder Sister    Alcohol abuse Brother    Drug abuse Brother    Bipolar disorder Paternal Aunt    Schizophrenia Paternal Aunt    Hypertension  Maternal Grandfather    Depression Maternal Grandmother    Cancer Maternal Grandmother        lung   Hypertension Paternal Grandfather    Cancer Paternal Grandmother        lung   Diabetes Neg Hx    Heart disease Neg Hx    Hyperlipidemia Neg Hx    Stroke Neg Hx    Suicidality Neg Hx     Social History Social History   Tobacco Use   Smoking status: Every Day    Current packs/day: 2.00    Average packs/day: 2.0 packs/day for 20.0 years (40.0 ttl pk-yrs)    Types: Cigarettes   Smokeless tobacco: Never  Vaping Use   Vaping status: Never Used  Substance Use Topics   Alcohol use: Not Currently    Comment: socially   Drug use: Yes    Types: Marijuana    Comment: daily- quit 1 month ago   Allergies Ciprofloxacin  Review of Systems Review of Systems  Physical Exam Vital Signs  I have reviewed the triage vital signs BP (!) 141/93   Pulse 85   Temp 98.2 F (36.8 C) (Oral)   Resp (!) 22   Ht 6' 5 (1.956 m)   Wt 106.6 kg   SpO2 95%   BMI 27.87 kg/m   Physical Exam Vitals reviewed.  Cardiovascular:     Rate and Rhythm: Normal rate.     Heart sounds: Normal heart sounds.  Pulmonary:     Effort: Pulmonary effort is normal.     Breath sounds: Normal breath sounds.  Chest:     Chest wall: No mass or tenderness.  Abdominal:     Palpations: Abdomen is soft.  Musculoskeletal:        General: Normal range of motion.  Skin:    General: Skin is warm.  Neurological:     Mental Status: He is alert.     ED Results and Treatments Labs (all labs ordered are listed, but only abnormal results are displayed) Labs Reviewed  BASIC METABOLIC PANEL WITH GFR - Abnormal; Notable for the following components:      Result Value   Glucose, Bld 111 (*)    All other components within normal limits  CBC - Abnormal; Notable for the following components:   MCHC 36.9 (*)    All other components within normal limits  TROPONIN I (HIGH SENSITIVITY)  TROPONIN I (HIGH SENSITIVITY)  Radiology CT Head Wo Contrast Result Date: 03/09/2024 EXAM: CT HEAD WITHOUT CONTRAST 03/09/2024 10:35:23 PM TECHNIQUE: CT of the head was performed without the administration of intravenous contrast. Automated exposure control, iterative reconstruction, and/or weight based adjustment of the mA/kV was utilized to reduce the radiation dose to as low as reasonably achievable. COMPARISON: 08/31/2021 CLINICAL HISTORY: Transient ischemic attack (TIA). FINDINGS: BRAIN AND VENTRICLES: No acute hemorrhage. Gray-white differentiation is preserved. No hydrocephalus. No extra-axial collection. No mass effect or midline shift. ORBITS: No acute abnormality. SINUSES: No acute abnormality. SOFT TISSUES AND SKULL: No acute soft tissue abnormality. No skull fracture. IMPRESSION: 1. No acute intracranial abnormality. Electronically signed by: Pinkie Pebbles MD 03/09/2024 10:46 PM EDT RP Workstation: [id number]   CT Angio Chest/Abd/Pel for Dissection W and/or W/WO Result Date: 03/09/2024 EXAM: CTA CHEST, ABDOMEN AND PELVIS WITHOUT AND WITH CONTRAST 03/09/2024 10:35:23 PM TECHNIQUE: CTA of the chest was performed without and with the administration of intravenous contrast. CTA of the abdomen and pelvis was performed with the administration of intravenous contrast. Multiplanar reformatted images are provided for review. MIP images are provided for review. Automated exposure control, iterative reconstruction, and/or weight based adjustment of the mA/kV was utilized to reduce the radiation dose to as low as reasonably achievable. COMPARISON: CTA chest dated 10/10/2012 and CT abdomen / pelvis dated 01/26/2012 CLINICAL HISTORY: Acute aortic syndrome (AAS) suspected. FINDINGS: VASCULATURE: AORTA: No evidence of intramural hematoma on unenhanced CT. No thoracic aortic aneurysm or dissection. No abdominal aortic  aneurysm or dissection. PULMONARY ARTERIES: Although not tailored for evaluation of the pulmonary arteries, there is no evidence of pulmonary embolism. GREAT VESSELS OF AORTIC ARCH: No acute finding. No dissection. No arterial occlusion or significant stenosis. CELIAC TRUNK: No acute finding. No occlusion or significant stenosis. SUPERIOR MESENTERIC ARTERY: No acute finding. No occlusion or significant stenosis. INFERIOR MESENTERIC ARTERY: No acute finding. No occlusion or significant stenosis. RENAL ARTERIES: No acute finding. No occlusion or significant stenosis. ILIAC ARTERIES: No acute finding. No occlusion or significant stenosis. CHEST: MEDIASTINUM: No mediastinal lymphadenopathy. The heart and pericardium demonstrate no acute abnormality. LUNGS AND PLEURA: The lungs are without acute process. No focal consolidation or pulmonary edema. No evidence of pleural effusion or pneumothorax. THORACIC BONES AND SOFT TISSUES: Mild degenerative changes of the lower thoracic spine. ABDOMEN AND PELVIS: LIVER: The liver is unremarkable. GALLBLADDER AND BILE DUCTS: Gallbladder is unremarkable. No biliary ductal dilatation. SPLEEN: The spleen is unremarkable. PANCREAS: The pancreas is unremarkable. ADRENAL GLANDS: Bilateral adrenal glands demonstrate no acute abnormality. KIDNEYS, URETERS AND BLADDER: No stones in the kidneys or ureters. No hydronephrosis. No perinephric or periureteral stranding. Urinary bladder is unremarkable. GI AND BOWEL: Stomach and duodenal sweep demonstrate no acute abnormality. There is no bowel obstruction. No abnormal bowel wall thickening or distension. Normal appendix (image 172). REPRODUCTIVE: Prostate is unremarkable. PERITONEUM AND RETROPERITONEUM: No ascites or free air. LYMPH NODES: No lymphadenopathy. ABDOMINAL BONES AND SOFT TISSUES: Mild degenerative changes at L2-3. No acute soft tissue abnormality. IMPRESSION: 1. No evidence of thoracoabdominal aortic aneurysm or dissection. 2. No  evidence of pulmonary embolism. 3. Negative CT chest, abdomen, and pelvis. Electronically signed by: Pinkie Pebbles MD 03/09/2024 10:45 PM EDT RP Workstation: [id number]   DG Chest 2 View Result Date: 03/09/2024 CLINICAL DATA:  Chest pain EXAM: CHEST - 2 VIEW COMPARISON:  10/18/2023 FINDINGS: The heart size and mediastinal contours are within normal limits. Both lungs are clear. The visualized skeletal structures are unremarkable. IMPRESSION: No active cardiopulmonary disease. Electronically Signed  By: Luke Bun M.D.   On: 03/09/2024 20:55    Pertinent labs & imaging results that were available during my care of the patient were reviewed by me and considered in my medical decision making (see MDM for details).  Medications Ordered in ED Medications  morphine  (PF) 4 MG/ML injection 4 mg (4 mg Intravenous Given 03/09/24 2136)  iohexol  (OMNIPAQUE ) 350 MG/ML injection 100 mL (100 mLs Intravenous Contrast Given 03/09/24 2236)  morphine  (PF) 4 MG/ML injection 4 mg (4 mg Intravenous Given 03/09/24 2321)  ketorolac  (TORADOL ) 15 MG/ML injection 15 mg (15 mg Intravenous Given 03/09/24 2321)                                                                                                                                     Procedures Procedures  (including critical care time)  Medical Decision Making / ED Course   This patient presents to the ED for concern of chest pain, this involves an extensive number of treatment options, and is a complaint that carries with it a high risk of complications and morbidity.  The differential diagnosis includes ACS, pleuritic chest pain, less likely PE, dissection.  MDM: Patient with cardiac history, and his story that this feels like his prior MI certainly concerning.  Initial EKG nonischemic, initial troponin not elevated at 8.  Patient will require second troponin, I have ordered CTA of the patient's chest given the report of pain with radiation of the  back.  Reassessment 11:30 PM-patient's troponin x 2 is negative.  Patient still endorsing the same discomfort, in addition is also endorsing some numbness in his left foot, left side of his face and his left arm.  I will order an MRI of the patient's brain.  Patient without any focal weakness, cranial nerves II through XII are intact.  Has intact strength.     Reassessment 11:50 PM-discussed patient's symptoms with on-call cardiologist Dr.  Duffy who agreed with the workup at this time.  I told the patient that it likely would be several hours before cardiology was able to come and see him given the number of consults that they currently have.  Patient feels reassured by the workup that he has received, does not feel that he needs to speak with cardiology in light of his normal workup.  He still pending his MRI.  Patient will be signed out to Dr. Griselda pending MRI brain.     Additional history obtained: -Additional history obtained from  -External records from outside source obtained and reviewed including: Chart review including previous notes, labs, imaging, consultation notes   Lab Tests: -I ordered, reviewed, and interpreted labs.   The pertinent results include:   Labs Reviewed  BASIC METABOLIC PANEL WITH GFR - Abnormal; Notable for the following components:      Result Value   Glucose, Bld 111 (*)    All other components within normal limits  CBC - Abnormal; Notable for the following components:   MCHC 36.9 (*)    All other components within normal limits  TROPONIN I (HIGH SENSITIVITY)  TROPONIN I (HIGH SENSITIVITY)      EKG my independent review of the patient's EKG shows no ST segment depressions or elevations, no T wave inversions, no evidence of acute ischemia.  EKG Interpretation Date/Time:  Tuesday March 09 2024 20:08:51 EDT Ventricular Rate:  87 PR Interval:  144 QRS Duration:  84 QT Interval:  366 QTC Calculation: 440 R Axis:   39  Text Interpretation: Normal  sinus rhythm Normal ECG When compared with ECG of 19-Oct-2023 00:33, PREVIOUS ECG IS PRESENT Confirmed by Mannie Pac [phone]) on 03/09/2024 8:31:58 PM         Imaging Studies ordered: I ordered imaging studies including CTA of the chest I independently visualized and interpreted imaging. I agree with the radiologist interpretation   Medicines ordered and prescription drug management: Meds ordered this encounter  Medications   morphine  (PF) 4 MG/ML injection 4 mg   iohexol  (OMNIPAQUE ) 350 MG/ML injection 100 mL   morphine  (PF) 4 MG/ML injection 4 mg   ketorolac  (TORADOL ) 15 MG/ML injection 15 mg    -I have reviewed the patients home medicines and have made adjustments as needed   Cardiac Monitoring: The patient was maintained on a cardiac monitor.  I personally viewed and interpreted the cardiac monitored which showed an underlying rhythm of: Normal sinus rhythm  Social Determinants of Health:  Factors impacting patients care include: Medical comorbidities including history of CAD.   Reevaluation: After the interventions noted above, I reevaluated the patient and found that they have :improved  Co morbidities that complicate the patient evaluation  Past Medical History:  Diagnosis Date   Acute kidney failure (HCC) 01/2012   Anxiety    Bipolar disorder (HCC)    hypomania   COPD (chronic obstructive pulmonary disease) (HCC)    Depression    Hyperlipidemia    Hypertension    Low back pain       Dispostion: Signed out to Dr. Griselda.     Final Clinical Impression(s) / ED Diagnoses Final diagnoses:  Chest pain, unspecified type     @PCDICTATION @    Mannie Pac T, DO 03/09/24 2356    Mannie Pac T, DO 03/09/24 2357    Mannie Pac T, DO 03/10/24 0005

## 2024-03-09 NOTE — ED Triage Notes (Addendum)
 Patient arrives with EMS for chest pain from home. Patient had been working outside and was cutting a tree down in his driveway. Pain started in his back. Hx of  MI in march. BP 184/90 on scene. Patient takes a blood thinner. Given 324 ASA with EMS. 18G IV  Patient states he is having left sided chest pain that radiates to his back and is now going down his left arm. Pain rated 5/10

## 2024-03-10 ENCOUNTER — Emergency Department (HOSPITAL_COMMUNITY)

## 2024-03-10 DIAGNOSIS — G459 Transient cerebral ischemic attack, unspecified: Secondary | ICD-10-CM | POA: Diagnosis not present

## 2024-03-10 NOTE — ED Provider Notes (Signed)
 Care assumed at midnight Cards consult MRI

## 2024-03-10 NOTE — ED Notes (Signed)
 Patient transported to MRI

## 2024-03-10 NOTE — Discharge Instructions (Addendum)
 While you were in the emergency room, you had blood work done that was normal.  Your troponin was not elevated.  Your EKG was normal.  You had a CT scan done of your chest to look at your aorta, and this test was normal.  You had an MRI of your brain done that was normal.  Please follow-up with your primary care doctor and your cardiologist within 1 week.  Return to the emergency room for any new or worsening pain in your chest.

## 2024-03-11 ENCOUNTER — Ambulatory Visit (INDEPENDENT_AMBULATORY_CARE_PROVIDER_SITE_OTHER): Admitting: Licensed Clinical Social Worker

## 2024-03-11 DIAGNOSIS — F411 Generalized anxiety disorder: Secondary | ICD-10-CM

## 2024-03-11 DIAGNOSIS — F410 Panic disorder [episodic paroxysmal anxiety] without agoraphobia: Secondary | ICD-10-CM | POA: Diagnosis not present

## 2024-03-11 DIAGNOSIS — F4010 Social phobia, unspecified: Secondary | ICD-10-CM

## 2024-03-11 DIAGNOSIS — F4310 Post-traumatic stress disorder, unspecified: Secondary | ICD-10-CM | POA: Diagnosis not present
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
## 2024-03-17 ENCOUNTER — Telehealth (INDEPENDENT_AMBULATORY_CARE_PROVIDER_SITE_OTHER): Admitting: Psychiatry

## 2024-03-17 ENCOUNTER — Encounter: Payer: Self-pay | Admitting: Psychiatry

## 2024-03-17 DIAGNOSIS — F4310 Post-traumatic stress disorder, unspecified: Secondary | ICD-10-CM | POA: Diagnosis not present

## 2024-03-17 DIAGNOSIS — F3181 Bipolar II disorder: Secondary | ICD-10-CM | POA: Diagnosis not present

## 2024-03-17 DIAGNOSIS — F410 Panic disorder [episodic paroxysmal anxiety] without agoraphobia: Secondary | ICD-10-CM

## 2024-03-17 DIAGNOSIS — F411 Generalized anxiety disorder: Secondary | ICD-10-CM | POA: Diagnosis not present

## 2024-03-17 MED ORDER — LORAZEPAM 1 MG PO TABS: ORAL_TABLET | ORAL | 1 refills | Status: DC

## 2024-03-17 NOTE — Patient Instructions (Signed)
 Increase buspirone  7.5 mg twice a day, 15 mg at night Continue Lumateprone 10.5 mg every other day Continue lorazepam  1 mg twice  a day as needed for anxiety Next appointment: 8/19 at 3 pm

## 2024-03-23 ENCOUNTER — Ambulatory Visit (INDEPENDENT_AMBULATORY_CARE_PROVIDER_SITE_OTHER): Admitting: Licensed Clinical Social Worker

## 2024-03-23 DIAGNOSIS — F4310 Post-traumatic stress disorder, unspecified: Secondary | ICD-10-CM | POA: Diagnosis not present

## 2024-03-23 DIAGNOSIS — F4010 Social phobia, unspecified: Secondary | ICD-10-CM | POA: Diagnosis not present

## 2024-03-23 DIAGNOSIS — F410 Panic disorder [episodic paroxysmal anxiety] without agoraphobia: Secondary | ICD-10-CM

## 2024-03-23 DIAGNOSIS — F411 Generalized anxiety disorder: Secondary | ICD-10-CM | POA: Diagnosis not present

## 2024-03-23 NOTE — Progress Notes (Signed)
 THERAPIST PROGRESS NOTE  Session Time: 11-11:53am  Participation Level: Active  Behavioral Response: CasualAlertEuthymic  Type of Therapy: Individual Therapy  Treatment Goals addressed:  Active     Anxiety     LTG: Mekai will score less than 5 on the Generalized Anxiety Disorder 7 Scale (GAD-7)  (Not Progressing)     Start:  09/17/23    Expected End:  03/16/24       Goal Note     02/18/24: Patient scored a 16 on his GAD7.          STG: Report a 50% reduction in the frequency of rehearsing thoughts related to social interactions. (Progressing)     Start:  09/17/23    Expected End:  03/16/24       Goal Note     02/18/24: Notes improvement. Shares psycho education of trauma responses was helpful as well as identifying his fear of rejection and failure.          Perform psychoeducation regarding anxiety disorders     Start:  09/17/23         Work with patient individually to identify the major components of a recent episode of anxiety: physical symptoms, major thoughts and images, and major behaviors they experienced     Start:  09/17/23         Coping Skills     Start:  09/17/23       Will work with the pt using CBT/DBT techniques to help the pt verbalize an understanding of the cognitive, physiological, and behavioral components of anxiety and its treatment. This will be done by using worksheets, interactive activities, CBT/ABC thought logs, modeling, homework, role playing and journaling. Will work with pt to learn and implement coping skills that result in a reduction of anxiety and improve daily functioning per pt self report 3 out of 5 documented sessions.       Participating in weekly Cognitive-Behavioral Therapy (CBT) sessions to focus on cognitive restructuring and address irrational fears.       Start:  09/17/23            Identify and challenge negative thoughts: Recognize distorted or unhelpful thinking patterns and substitute them with more  balanced and constructive alternatives.       Start:  09/17/23              Social Interpersonal Effectiveness     LTG: Trammell will attend and participate in therapeutic, recreational and educational activities that support interpersonal effectiveness   (Progressing)     Start:  09/17/23    Expected End:  03/16/24         LTG: Sricharan will recognize socially inappropriate behaviors and develop alternative behaviors (Progressing)     Start:  09/17/23    Expected End:  03/16/24         STG: [name] will demonstrate interest in social activities by initiating/joining social activity without prompts (Progressing)     Start:  09/17/23    Expected End:  03/16/24       Goal Note     02/18/24: I think I'm a little less avoidant.          STG: Jani will identify 2 behaviors that engage in social isolation  (Progressing)     Start:  09/17/23    Expected End:  03/16/24       Goal Note     02/18/24: I've gotten better about going and doing stuck rather than not doing it  because of anxiety.          Educate [name] on appropriate behaviors and boundaries     Start:  09/17/23         Facilitate discussions with Barnet regarding miscommunications that occur during social interactions     Start:  09/17/23         Educate Aseel on aggressive, passive,and assertive communication styles     Start:  09/17/23            ProgressTowards Goals: Progressing  Interventions: Solution Focused, Assertiveness Training, and Supportive  Summary: VIN YONKE III is a 53 y.o. male who presents with low mood, lack of motivation, difficulty concentrating, restlessness, uncontrollable worry, anxious feelings, ruminating thoughts, irritability, difficulty sleeping, tension, and suicidal thoughts. Pt was oriented times 5. Pt was cooperative and engaged. Pt denies SI/HI/AVH.   The patient arrived at the session feeling deflated and anxious. 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 He also discussed conflicts with colleagues regarding collaboration. The clinician encouraged the patient to accept certain aspects of the current dynamic and explored ways he could problem-solve moving forward. To help the patient balance his cortisol levels, the clinician assessed his self-care practices. The patient expressed intentions to take days off, engage in reading, and spend time socializing.  Additionally, the patient shared distressing events in his personal relationships over the past two weeks that led to a meltdown. He reflected on increased anxiety due to ineffective coping skills. In his home life, he noted improvements in redirecting self-blame but struggles with feelings of disrespect. The patient mentioned experiencing suicidal ideations on March 22, 2024, due to his mental health challenges and complications with his medication, Caplyta , but he denied any intent or plan. The clinician assessed his safety and reviewed safety measures in case he experiences future suicidal thoughts. The patient reported possible side effects from the medication, including difficulty getting out of bed on two days the previous week. He has since stopped taking Caplyta  due to fears of suicidal thoughts. He recognized a pattern of worsening mental health while on this medication, stating that his suicidal ideations ceased when he stopped taking Caplyta  in the past. He denied any current suicidal ideations, intent, or plans during today's session.  The clinician and patient continued to explore coping strategies for managing stressors at home. The clinician provided psychoeducation on trauma responses and the cycle of violence. The patient reflected on his efforts to maintain a healthy home environment for his children and recognized the protective factors he needs for his own well-being. The  clinician reviewed self-care techniques and encouraged the patient to regularly engage in coping skills, focusing on factors he can control. The patient was advised to reach out to the office if there are any significant changes in his situation or mental health.  Suicidal/Homicidal: no without intent or plan.   Therapist Response: During the session, the clinician employed active listening to create a safe environment for the patient to process recent life events. The clinician assessed the patient's current symptoms, stressors, and safety since the last session.  Clinician worked with patient on use of assertive communication to address stressors within the home.  Clinician also provided brief psychoeducation on trauma/abuse and trauma responses.  Clinician assessed for safety and revisited safety protocol should patient experience suicidal ideations.   Plan: Return again in 2 weeks.  Diagnosis: GAD (  generalized anxiety disorder)  Social anxiety disorder  PTSD (post-traumatic stress disorder)  Panic 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   Collaboration of Care: [facility] psychiatrist can access notes and cln. Will review psychiatrists' notes. Check in with the patient and will see LCSW per availability. Patient agreed with treatment recommendations.   Patient/Guardian was advised Release of Information must be obtained prior to any record release in order to collaborate their care with an outside provider. Patient/Guardian was advised if they have not already done so to contact the registration department to sign all necessary forms in order for us  to release information regarding their care.   Consent: Patient/Guardian gives verbal consent for treatment and assignment of benefits for services provided during this visit. Patient/Guardian expressed understanding and agreed to proceed.   Evalene KATHEE Husband, LCSW 03/23/2024

## 2024-03-29 ENCOUNTER — Telehealth: Payer: Self-pay | Admitting: Psychiatry

## 2024-03-29 ENCOUNTER — Other Ambulatory Visit: Payer: Self-pay | Admitting: Psychiatry

## 2024-03-29 DIAGNOSIS — F411 Generalized anxiety disorder: Secondary | ICD-10-CM

## 2024-03-29 DIAGNOSIS — F410 Panic disorder [episodic paroxysmal anxiety] without agoraphobia: Secondary | ICD-10-CM

## 2024-03-29 MED ORDER — LORAZEPAM 1 MG PO TABS: ORAL_TABLET | ORAL | 1 refills | Status: DC

## 2024-03-29 NOTE — Telephone Encounter (Signed)
 Called patient he was very upset he stated that he has had to take a few extra pills because he feels like he is losing his mind and he is not sure why he is seeing  a psychiatrist and a therapist and its not doing him any good he stated that if he can not get his medication he will handle it himself I called the pharmacy to see how soon he could get it he can not pick up the medication until 03-31-24 he really got upset after that and stated that he would take care of it himself

## 2024-03-29 NOTE — Telephone Encounter (Signed)
 Please advise the pharmacy to fill it today to avoid withdrawal. I sent in the new order. Please advise to cancel the previous orders to avoid confusion.

## 2024-03-29 NOTE — Telephone Encounter (Signed)
 Per review, the prescription was filled on 7/14 and should last through today. Please verify the dose the patient is currently taking and remind him to take the medication as prescribed to avoid running out early. If he is completely out, please contact the pharmacy to fill the order today (there is already an order to be started tomorrow)
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
## 2024-03-30 ENCOUNTER — Telehealth: Payer: Self-pay
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
## 2024-03-30 NOTE — Telephone Encounter (Signed)
 They just told me that the order that was sent in was the same and it doesn't matter he can not get it until tomorrow I asked them what can be done to help him get it and again they said he can not come back to the store until tomorrow due to his behavior and I have let the patient know the same thing that I was told he voiced understanding

## 2024-04-06 ENCOUNTER — Ambulatory Visit (INDEPENDENT_AMBULATORY_CARE_PROVIDER_SITE_OTHER): Admitting: Psychiatry

## 2024-04-06 ENCOUNTER — Other Ambulatory Visit (HOSPITAL_COMMUNITY): Payer: Self-pay

## 2024-04-06 ENCOUNTER — Encounter: Payer: Self-pay | Admitting: Psychiatry

## 2024-04-06 ENCOUNTER — Other Ambulatory Visit (HOSPITAL_BASED_OUTPATIENT_CLINIC_OR_DEPARTMENT_OTHER): Payer: Self-pay

## 2024-04-06 VITALS — BP 132/84 | HR 84 | Temp 98.4°F | Ht 77.0 in | Wt 237.4 lb

## 2024-04-06 DIAGNOSIS — F3181 Bipolar II disorder: Secondary | ICD-10-CM | POA: Diagnosis not present

## 2024-04-06 DIAGNOSIS — F410 Panic disorder [episodic paroxysmal anxiety] without agoraphobia: Secondary | ICD-10-CM | POA: Diagnosis not present

## 2024-04-06 DIAGNOSIS — F411 Generalized anxiety disorder: Secondary | ICD-10-CM | POA: Diagnosis not present

## 2024-04-06 DIAGNOSIS — F4310 Post-traumatic stress disorder, unspecified: Secondary | ICD-10-CM | POA: Diagnosis not present

## 2024-04-06 MED ORDER — CARIPRAZINE HCL 1.5 MG PO CAPS
1.5000 mg | ORAL_CAPSULE | Freq: Every day | ORAL | 1 refills | Status: DC
  Filled 2024-04-06: qty 30, 30d supply, fill #0

## 2024-04-06 MED ORDER — LORAZEPAM 1 MG PO TABS
ORAL_TABLET | ORAL | 1 refills | Status: DC
  Filled 2024-04-06 – 2024-04-30 (×2): qty 75, 30d supply, fill #0

## 2024-04-06 NOTE — Patient Instructions (Signed)
 Conitnue buspirone  7.5 mg twice a day, 15 mg at night Start Vraylar  1.5 mg daily  Hold lumateprone Continue lorazepam  1 mg twice a day as needed for anxiety, 75 tablets  Next appointment: 10/16 at 10:30

## 2024-04-07 ENCOUNTER — Other Ambulatory Visit (HOSPITAL_COMMUNITY): Payer: Self-pay

## 2024-04-07 DIAGNOSIS — G4700 Insomnia, unspecified: Secondary | ICD-10-CM

## 2024-04-21 ENCOUNTER — Ambulatory Visit: Admitting: Licensed Clinical Social Worker

## 2024-04-26 ENCOUNTER — Encounter: Payer: Self-pay | Admitting: Sleep Medicine

## 2024-04-26 ENCOUNTER — Ambulatory Visit (INDEPENDENT_AMBULATORY_CARE_PROVIDER_SITE_OTHER): Admitting: Sleep Medicine

## 2024-04-26 VITALS — BP 120/70 | HR 84 | Temp 97.8°F | Ht 76.0 in | Wt 239.2 lb

## 2024-04-26 DIAGNOSIS — I10 Essential (primary) hypertension: Secondary | ICD-10-CM | POA: Diagnosis not present

## 2024-04-26 DIAGNOSIS — G4700 Insomnia, unspecified: Secondary | ICD-10-CM

## 2024-04-26 DIAGNOSIS — G4710 Hypersomnia, unspecified: Secondary | ICD-10-CM

## 2024-04-26 DIAGNOSIS — R062 Wheezing: Secondary | ICD-10-CM | POA: Diagnosis not present

## 2024-04-26 DIAGNOSIS — I2510 Atherosclerotic heart disease of native coronary artery without angina pectoris: Secondary | ICD-10-CM

## 2024-04-26 DIAGNOSIS — G4733 Obstructive sleep apnea (adult) (pediatric): Secondary | ICD-10-CM

## 2024-04-26 DIAGNOSIS — F17210 Nicotine dependence, cigarettes, uncomplicated: Secondary | ICD-10-CM

## 2024-04-26 DIAGNOSIS — F17200 Nicotine dependence, unspecified, uncomplicated: Secondary | ICD-10-CM

## 2024-04-26 NOTE — Patient Instructions (Signed)
 SABRA

## 2024-04-26 NOTE — Progress Notes (Signed)
 Name:Matthew [name] MRN: [id number] DOB: [birth year]   CHIEF COMPLAINT:  EXCESSIVE DAYTIME SLEEPINESS   HISTORY OF PRESENT ILLNESS: Mr. Matthew [name] is a 53 y.o. w/ a h/o HTN, CAD and morbid obesity who presents for c/o loud snoring, witnessed apnea and excessive daytime sleepiness which has been present for several years. Reports that sleep difficulty started after having an MI in March. Reports nocturnal awakenings due to unclear reasons and has difficulty falling back to sleep. Denies any significant weight changes. Admits to dry mouth, night sweats and morning headaches. Denies RLS symptoms, dream enactment, cataplexy, hypnagogic or hypnapompic hallucinations. Denies a family history of sleep apnea. Denies drowsy driving. Drinks 4-5 sodas and 3-4 glasses of tea daily, 2-3 glasses of wine nightly, smokes 2 ppd tobacco use daily, smokes marijuana use daily.    Bedtime 9-10 pm Sleep onset 30 mins Rise time 6 am   EPWORTH SLEEP SCORE 19    04/26/2024    8:00 AM  Results of the Epworth flowsheet  Sitting and reading 3  Watching TV 3  Sitting, inactive in a public place (e.g. a theatre or a meeting) 3  As a passenger in a car for an hour without a break 1  Lying down to rest in the afternoon when circumstances permit 3  Sitting and talking to someone 2  Sitting quietly after a lunch without alcohol 3  In a car, while stopped for a few minutes in traffic 1  Total score 19    PAST MEDICAL HISTORY :   has a past medical history of Acute kidney failure (HCC) (01/2012), Anxiety, Bipolar disorder (HCC), COPD (chronic obstructive pulmonary disease) (HCC), Depression, Hyperlipidemia, Hypertension, and Low back pain.  has a past surgical history that includes No past surgeries; Coronary/Graft Acute MI Revascularization (N/A, 10/15/2023); and LEFT HEART CATH AND CORONARY ANGIOGRAPHY (N/A, 10/15/2023). Prior to Admission medications   Medication Sig Start Date End Date Taking?  Authorizing Provider  aspirin  81 MG chewable tablet Chew 1 tablet (81 mg total) by mouth daily. 11/10/23  Yes Pender, Julie F, FNP  atorvastatin  (LIPITOR ) 80 MG tablet Take 1 tablet (80 mg total) by mouth daily. 11/10/23  Yes Pender, Julie F, FNP  busPIRone  (BUSPAR ) 15 MG tablet Take 0.5 tablets (7.5 mg total) by mouth daily AND 1 tablet (15 mg total) every evening. Patient taking differently: 7.5 mg twice a day and 15 mg at night 02/08/24 05/08/24 Yes Hisada, Katheren, MD  lisinopril  (ZESTRIL ) 2.5 MG tablet Take 1 tablet (2.5 mg total) by mouth at bedtime. 11/10/23  Yes Pender, Julie F, FNP  LORazepam  (ATIVAN ) 1 MG tablet May take 1 tablet (1 mg total) by mouth 2 (two) times daily as needed for anxiety. May also take 1 tablet (1 mg total) every other day as needed for anxiety. 03/29/24 05/28/24 Yes Vickey Katheren, MD  LORazepam  (ATIVAN ) 1 MG tablet Take 1 tablet by mouth two times a day as needed for anxiety, may also take 1 tablet by mouth every other day as needed for anxiety 03/17/24  Yes Hisada, Katheren, MD  metoprolol  succinate (TOPROL -XL) 25 MG 24 hr tablet Take 0.5 tablets (12.5 mg total) by mouth daily. 11/10/23  Yes Gareth Mliss FALCON, FNP  prasugrel  (EFFIENT ) 10 MG TABS tablet Take 1 tablet (10 mg total) by mouth daily. 11/10/23  Yes Gareth Mliss FALCON, FNP  cariprazine  (VRAYLAR ) 1.5 MG capsule Take 1 capsule (1.5 mg total) by mouth daily. Patient not taking: Reported on 04/26/2024  04/06/24 06/05/24  Vickey Mettle, MD   Allergies  Allergen Reactions   Ciprofloxacin     Acute kidney failure    FAMILY HISTORY:  family history includes Alcohol abuse in his brother and father; Anxiety disorder in his mother; Bipolar disorder in his paternal aunt and sister; Cancer in his maternal grandmother and paternal grandmother; Depression in his maternal grandmother; Drug abuse in his brother and father; Hypertension in his maternal grandfather and paternal grandfather; Schizophrenia in his paternal aunt. SOCIAL HISTORY:   reports that he has been smoking cigarettes. He has a 40 pack-year smoking history. He has never used smokeless tobacco. He reports that he does not currently use alcohol. He reports current drug use. Drug: Marijuana.   Review of Systems:  Gen:  Denies  fever, sweats, chills weight loss  HEENT: Denies blurred vision, double vision, ear pain, eye pain, hearing loss, nose bleeds, sore throat Cardiac:  No dizziness, chest pain or heaviness, chest tightness,edema, No JVD Resp:   No cough, -sputum production, -shortness of breath,-wheezing, -hemoptysis,  Gi: Denies swallowing difficulty, stomach pain, nausea or vomiting, diarrhea, constipation, bowel incontinence Gu:  Denies bladder incontinence, burning urine Ext:   Denies Joint pain, stiffness or swelling Skin: Denies  skin rash, easy bruising or bleeding or hives Endoc:  Denies polyuria, polydipsia , polyphagia or weight change Psych:   Denies depression, insomnia or hallucinations  Other:  All other systems negative  VITAL SIGNS: BP 120/70   Pulse 84   Temp 97.8 F (36.6 C)   Ht 6' 4 (1.93 m)   Wt 239 lb 3.2 oz (108.5 kg)   SpO2 96%   BMI 29.12 kg/m     Physical Examination:   General Appearance: No distress  EYES PERRLA, EOM intact.   NECK Supple, No JVD Pulmonary: normal breath sounds, No wheezing.  CardiovascularNormal S1,S2.  No m/r/g.   Abdomen: Benign, Soft, non-tender. Skin:   warm, no rashes, no ecchymosis  Extremities: normal, no cyanosis, clubbing. Neuro:without focal findings,  speech normal  PSYCHIATRIC: Mood, affect within normal limits.   ASSESSMENT AND PLAN  OSA I suspect that OSA is likely present due to clinical presentation. Discussed the consequences of untreated sleep apnea. Advised not to drive drowsy for safety of patient and others. Will complete further evaluation with a home sleep study and follow up to review results.    HTN Stable, on current management. Following with PCP.   CAD Stable,  on current management.   Insomnia Counseled patient on stimulus control and improving sleep hygiene practices.   Wheezing Due to history of chronic tobacco use and intermittent dyspnea/wheezing, will refer patient to pulmonary for further evaluation and management.    MEDICATION ADJUSTMENTS/LABS AND TESTS ORDERED: Recommend Sleep Study   Patient  satisfied with Plan of action and management. All questions answered  Follow up to review HST results and treatment plan.   I spent a total of 45 minutes reviewing chart data, face-to-face evaluation with the patient, counseling and coordination of care as detailed above.    Marcio Hoque, M.D.  Sleep Medicine [facility]

## 2024-04-30 ENCOUNTER — Other Ambulatory Visit: Payer: Self-pay

## 2024-04-30 ENCOUNTER — Other Ambulatory Visit (HOSPITAL_COMMUNITY): Payer: Self-pay
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
## 2024-05-03 ENCOUNTER — Ambulatory Visit (INDEPENDENT_AMBULATORY_CARE_PROVIDER_SITE_OTHER): Admitting: Licensed Clinical Social Worker

## 2024-05-03 ENCOUNTER — Telehealth: Payer: Self-pay | Admitting: Licensed Clinical Social Worker

## 2024-05-03 ENCOUNTER — Encounter: Payer: Self-pay | Admitting: Licensed Clinical Social Worker

## 2024-05-03 DIAGNOSIS — F411 Generalized anxiety disorder: Secondary | ICD-10-CM | POA: Diagnosis not present

## 2024-05-03 DIAGNOSIS — F4310 Post-traumatic stress disorder, unspecified: Secondary | ICD-10-CM

## 2024-05-03 DIAGNOSIS — F3181 Bipolar II disorder: Secondary | ICD-10-CM

## 2024-05-03 DIAGNOSIS — F410 Panic disorder [episodic paroxysmal anxiety] without agoraphobia: Secondary | ICD-10-CM
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
## 2024-05-04 ENCOUNTER — Other Ambulatory Visit: Payer: Self-pay | Admitting: Psychiatry

## 2024-05-05 ENCOUNTER — Ambulatory Visit (INDEPENDENT_AMBULATORY_CARE_PROVIDER_SITE_OTHER): Admitting: Licensed Clinical Social Worker

## 2024-05-05 DIAGNOSIS — F411 Generalized anxiety disorder: Secondary | ICD-10-CM

## 2024-05-05 DIAGNOSIS — F3181 Bipolar II disorder: Secondary | ICD-10-CM

## 2024-05-05 DIAGNOSIS — F410 Panic disorder [episodic paroxysmal anxiety] without agoraphobia: Secondary | ICD-10-CM

## 2024-05-05 DIAGNOSIS — F4310 Post-traumatic stress disorder, unspecified: Secondary | ICD-10-CM

## 2024-05-05 NOTE — Progress Notes (Unsigned)
 THERAPIST PROGRESS NOTE  Session Time: 4-:02pm-5:05pm  Participation Level: Active  Behavioral Response: CasualAlertDepressed  Type of Therapy: Individual Therapy  Treatment Goals addressed:  Active     Anxiety     LTG: Rylie will score less than 5 on the Generalized Anxiety Disorder 7 Scale (GAD-7)  (Not Progressing)     Start:  09/17/23    Expected End:  03/16/24       Goal Note     02/18/24: Patient scored a 16 on his GAD7.          STG: Report a 50% reduction in the frequency of rehearsing thoughts related to social interactions. (Progressing)     Start:  09/17/23    Expected End:  03/16/24       Goal Note     02/18/24: Notes improvement. Shares psycho education of trauma responses was helpful as well as identifying his fear of rejection and failure.          Perform psychoeducation regarding anxiety disorders     Start:  09/17/23         Work with patient individually to identify the major components of a recent episode of anxiety: physical symptoms, major thoughts and images, and major behaviors they experienced     Start:  09/17/23         Coping Skills     Start:  09/17/23       Will work with the pt using CBT/DBT techniques to help the pt verbalize an understanding of the cognitive, physiological, and behavioral components of anxiety and its treatment. This will be done by using worksheets, interactive activities, CBT/ABC thought logs, modeling, homework, role playing and journaling. Will work with pt to learn and implement coping skills that result in a reduction of anxiety and improve daily functioning per pt self report 3 out of 5 documented sessions.       Participating in weekly Cognitive-Behavioral Therapy (CBT) sessions to focus on cognitive restructuring and address irrational fears.       Start:  09/17/23            Identify and challenge negative thoughts: Recognize distorted or unhelpful thinking patterns and substitute them with  more balanced and constructive alternatives.       Start:  09/17/23              Social Interpersonal Effectiveness     LTG: Archimedes will attend and participate in therapeutic, recreational and educational activities that support interpersonal effectiveness   (Progressing)     Start:  09/17/23    Expected End:  03/16/24         LTG: Karo will recognize socially inappropriate behaviors and develop alternative behaviors (Progressing)     Start:  09/17/23    Expected End:  03/16/24         STG: [name] will demonstrate interest in social activities by initiating/joining social activity without prompts (Progressing)     Start:  09/17/23    Expected End:  03/16/24       Goal Note     02/18/24: I think I'm a little less avoidant.          STG: Marshon will identify 2 behaviors that engage in social isolation  (Progressing)     Start:  09/17/23    Expected End:  03/16/24       Goal Note     02/18/24: I've gotten better about going and doing stuck rather than not doing it  because of anxiety.          Educate [name] on appropriate behaviors and boundaries     Start:  09/17/23         Facilitate discussions with Zander regarding miscommunications that occur during social interactions     Start:  09/17/23         Educate Leland on aggressive, passive,and assertive communication styles     Start:  09/17/23            ProgressTowards Goals: Progressing  Interventions: Conservator, museum/gallery, Supportive, and Other: Psychoeducation  Summary: SAMUL MCINROY III is a 53 y.o. male who presents with low mood, lack of motivation, difficulty concentrating, restlessness, uncontrollable worry, anxious feelings, ruminating thoughts, irritability, difficulty sleeping, tension, and suicidal thoughts. Pt was oriented times 5. Pt was cooperative and engaged. Pt denies SI/HI/AVH.    The clinician provided psychoeducation to the 4 horsemen regarding the barriers to  communication, aiming to reflect on their impact on his relationships and how to address them with specific antidotes. The patient recognized the various communication barriers present in his relationship. The clinician encouraged the patient to identify alternative perspectives and consider the motivational factors of other parties involved. The patient acknowledged that he often resorts to defensiveness and stonewalling due to emotional triggers. He reflected on how trust affects his ability to feel safe when openly communicating with others. Additionally, the patient expressed feelings of sadness related to his fear that his support system is diminishing. He considered how his history of trauma might be influencing these communication barriers. The clinician suggested possible talking points for the patient to engage with his spouse, aiming to foster positive feedback and opportunities for growth. Together, we worked to identify potential solutions to enhance connectedness in his relationships.  Suicidal/Homicidal: Nowithout intent/plan  Therapist Response: During the session, the clinician employed active listening to create a safe environment for the patient to process recent life events. The clinician assessed the patient's current symptoms, stressors, and safety since the last session.  Clinician provided psychoeducation on the for Horseman of the apocalypse specifically with communication barriers.  Worked with patient to identify ways in which she can utilize healthy and assertive communication.Worked with patient to identify possible solutions to improve connectedness.  Plan: Return again in 2 weeks.  Diagnosis: PTSD (post-traumatic stress disorder)  Bipolar 2 disorder (HCC)  GAD (generalized anxiety disorder)  Panic 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   Collaboration of Care: AEB psychiatrist can access notes and cln. Will review psychiatrists' notes. Check in with the patient and will see LCSW  per availability. Patient agreed with treatment recommendations.   Patient/Guardian was advised Release of Information must be obtained prior to any record release in order to collaborate their care with an outside provider. Patient/Guardian was advised if they have not already done so to contact the registration department to sign all necessary forms in order for us  to release information regarding their care.   Consent: Patient/Guardian gives verbal consent for treatment and assignment of benefits for services provided during this visit. Patient/Guardian expressed understanding and agreed to proceed.   Evalene KATHEE Husband, LCSW 05/05/2024

## 2024-05-06 ENCOUNTER — Other Ambulatory Visit (HOSPITAL_COMMUNITY): Payer: Self-pay

## 2024-05-06 ENCOUNTER — Ambulatory Visit (INDEPENDENT_AMBULATORY_CARE_PROVIDER_SITE_OTHER): Admitting: Psychiatry

## 2024-05-06 ENCOUNTER — Ambulatory Visit: Admitting: Licensed Clinical Social Worker

## 2024-05-06 ENCOUNTER — Encounter: Payer: Self-pay | Admitting: Psychiatry

## 2024-05-06 VITALS — BP 126/84 | HR 84 | Temp 98.8°F | Ht 76.0 in | Wt 233.8 lb

## 2024-05-06 DIAGNOSIS — F410 Panic disorder [episodic paroxysmal anxiety] without agoraphobia: Secondary | ICD-10-CM | POA: Diagnosis not present

## 2024-05-06 DIAGNOSIS — F4310 Post-traumatic stress disorder, unspecified: Secondary | ICD-10-CM | POA: Diagnosis not present

## 2024-05-06 DIAGNOSIS — F411 Generalized anxiety disorder: Secondary | ICD-10-CM

## 2024-05-06 DIAGNOSIS — F3181 Bipolar II disorder: Secondary | ICD-10-CM

## 2024-05-06 DIAGNOSIS — G4700 Insomnia, unspecified: Secondary | ICD-10-CM

## 2024-05-06 MED ORDER — RISPERIDONE 0.5 MG PO TABS
0.5000 mg | ORAL_TABLET | Freq: Every day | ORAL | 0 refills | Status: DC
  Filled 2024-05-06: qty 30, 30d supply, fill #0

## 2024-05-06 NOTE — Patient Instructions (Signed)
 Continue buspirone  7.5 mg twice a day, 15 mg at night  Start risperidone  0.5 mg at night  Discontinue quetiapine   Continue lorazepam  1 mg twice a day as needed for anxiety, 75 tablets per month Next appointment: 10/16 at 10:30

## 2024-05-08 ENCOUNTER — Other Ambulatory Visit: Payer: Self-pay | Admitting: Nurse Practitioner

## 2024-05-08 DIAGNOSIS — I252 Old myocardial infarction: Secondary | ICD-10-CM

## 2024-05-10 NOTE — Telephone Encounter (Signed)
 Attempted to contact patient to schedule appointment- no naswer- unable to leave call back message. Message sent in MyChart- courtesy 30 day Rx given Requested Prescriptions  Pending Prescriptions Disp Refills   lisinopril  (ZESTRIL ) 2.5 MG tablet [Pharmacy Med Name: LISINOPRIL  2.5 MG TABLET] 30 tablet 0    Sig: TAKE 1 TABLET BY MOUTH AT BEDTIME.     Cardiovascular:  ACE Inhibitors Failed - 05/10/2024  2:00 PM      Failed - Valid encounter within last 6 months    Recent Outpatient Visits           6 months ago History of MI (myocardial infarction)   [facility] Gareth Mliss FALCON, FNP              Passed - Cr in normal range and within 180 days    Creat  Date Value Ref Range Status  04/07/2020 0.87 0.60 - 1.35 mg/dL Final   Creatinine, Ser  Date Value Ref Range Status  03/09/2024 0.79 0.61 - 1.24 mg/dL Final   Creatinine,U  Date Value Ref Range Status  02/03/2014 147.09 mg/dL Final    Comment:       Cutoff Values for Urine Drug Screen, Pain Mgmt          Drug Class           Cutoff (ng/mL)          Amphetamines             500          Barbiturates             200          Cocaine Metabolites      150          Benzodiazepines          200          Methadone                300          Opiates                  300          Phencyclidine             25          Propoxyphene             300          Marijuana Metabolites     50    For medical purposes only.         Passed - K in normal range and within 180 days    Potassium  Date Value Ref Range Status  03/09/2024 3.6 3.5 - 5.1 mmol/L Final         Passed - Patient is not pregnant      Passed - Last BP in normal range    BP Readings from Last 1 Encounters:  04/26/24 120/70          atorvastatin  (LIPITOR ) 80 MG tablet [Pharmacy Med Name: ATORVASTATIN  80 MG TABLET] 30 tablet 0    Sig: TAKE 1 TABLET BY MOUTH EVERY DAY     Cardiovascular:  Antilipid - Statins Failed - 05/10/2024  2:00 PM       Failed - Lipid Panel in normal range within the last 12 months    Cholesterol, Total  Date Value Ref Range Status  03/12/2018 155 100 - 199  mg/dL Final   Cholesterol  Date Value Ref Range Status  10/16/2023 145 0 - 200 mg/dL Final   LDL Calculated  Date Value Ref Range Status  03/12/2018 57 0 - 99 mg/dL Final   LDL Cholesterol  Date Value Ref Range Status  10/16/2023 74 0 - 99 mg/dL Final    Comment:           Total Cholesterol/HDL:CHD Risk Coronary Heart Disease Risk Table                     Men   Women  1/2 Average Risk   3.4   3.3  Average Risk       5.0   4.4  2 X Average Risk   9.6   7.1  3 X Average Risk  23.4   11.0        Use the calculated Patient Ratio above and the CHD Risk Table to determine the patient's CHD Risk.        ATP III CLASSIFICATION (LDL):  <100     mg/dL   Optimal  899-870  mg/dL   Near or Above                    Optimal  130-159  mg/dL   Borderline  839-810  mg/dL   High  >809     mg/dL   Very High Performed at [facility], [address] [place], [place] [address]    HDL  Date Value Ref Range Status  10/16/2023 22 (L) >40 mg/dL Final  92/74/7980 26 (L) >39 mg/dL Final   Triglycerides  Date Value Ref Range Status  10/16/2023 244 (H) <150 mg/dL Final         Passed - Patient is not pregnant      Passed - Valid encounter within last 12 months    Recent Outpatient Visits           6 months ago History of MI (myocardial infarction)   [facility] Gareth Mliss FALCON, FNP               prasugrel  (EFFIENT ) 10 MG TABS tablet [Pharmacy Med Name: PRASUGREL  10 MG TABLET] 30 tablet 0    Sig: TAKE 1 TABLET BY MOUTH EVERY DAY     Hematology:  Antiplatelets Failed - 05/10/2024  2:00 PM      Failed - Valid encounter within last 6 months    Recent Outpatient Visits           6 months ago History of MI (myocardial infarction)   [facility] Gareth Mliss FALCON, FNP               Passed - HGB in normal range and within 180 days    Hemoglobin  Date Value Ref Range Status  03/09/2024 15.3 13.0 - 17.0 g/dL Final  92/74/7980 84.9 13.0 - 17.7 g/dL Final         Passed - HCT in normal range and within 180 days    HCT  Date Value Ref Range Status  03/09/2024 41.5 39.0 - 52.0 % Final   Hematocrit  Date Value Ref Range Status  03/12/2018 43.9 37.5 - 51.0 % Final         Passed - PLT in normal range and within 180 days    Platelets  Date Value Ref Range Status  03/09/2024 229 150 - 400 K/uL Final  03/12/2018 235 150 - 450 x10E3/uL Final          metoprolol  succinate (TOPROL -XL) 25 MG 24 hr tablet [Pharmacy Med Name: METOPROLOL  SUCC ER 25 MG TAB] 15 tablet 0    Sig: TAKE 1/2 TABLET BY MOUTH EVERY DAY     Cardiovascular:  Beta Blockers Failed - 05/10/2024  2:00 PM      Failed - Valid encounter within last 6 months    Recent Outpatient Visits           6 months ago History of MI (myocardial infarction)   [facility] Gareth Mliss FALCON, FNP              Passed - Last BP in normal range    BP Readings from Last 1 Encounters:  04/26/24 120/70         Passed - Last Heart Rate in normal range    Pulse Readings from Last 1 Encounters:  04/26/24 84          aspirin  ([employer] ASPIRIN  ADULT LOW DOSE) 81 MG chewable tablet [Pharmacy Med Name: [facility] ASPIRIN  81 MG CHEWABLE TAB] 30 tablet 0    Sig: CHEW 1 TABLET BY MOUTH DAILY.     Analgesics:  NSAIDS - aspirin  Passed - 05/10/2024  2:00 PM      Passed - Cr in normal range and within 360 days    Creat  Date Value Ref Range Status  04/07/2020 0.87 0.60 - 1.35 mg/dL Final   Creatinine, Ser  Date Value Ref Range Status  03/09/2024 0.79 0.61 - 1.24 mg/dL Final   Creatinine,U  Date Value Ref Range Status  02/03/2014 147.09 mg/dL Final    Comment:       Cutoff Values for Urine Drug Screen, Pain Mgmt          Drug Class           Cutoff (ng/mL)          Amphetamines              500          Barbiturates             200          Cocaine Metabolites      150          Benzodiazepines          200          Methadone                300          Opiates                  300          Phencyclidine             25          Propoxyphene             300          Marijuana Metabolites     50    For medical purposes only.         Passed - eGFR is 10 or above and within 360 days    GFR, Est African American  Date Value Ref Range Status  09/08/2014 >89 mL/min Final   GFR calc Af Amer  Date Value Ref Range Status  08/28/2018 >60 >60 mL/min Final   GFR, Est Non African American  Date Value Ref Range Status  09/08/2014 >89 mL/min Final    Comment:      The estimated GFR is a calculation valid for adults (>=28 years old) that uses the CKD-EPI algorithm to adjust for age and sex. It is   not to be used for children, pregnant women, hospitalized patients,    patients on dialysis, or with rapidly changing kidney function. According to the NKDEP, eGFR >89 is normal, 60-89 shows mild impairment, 30-59 shows moderate impairment, 15-29 shows severe impairment and <15 is ESRD.      GFR, Estimated  Date Value Ref Range Status  03/09/2024 >60 >60 mL/min Final    Comment:    (NOTE) Calculated using the CKD-EPI Creatinine Equation (2021)    GFR  Date Value Ref Range Status  05/14/2019 100.13 >60.00 mL/min Final         Passed - Patient is not pregnant      Passed - Valid encounter within last 12 months    Recent Outpatient Visits           6 months ago History of MI (myocardial infarction)   [facility] Gareth Mliss FALCON, [place]

## 2024-05-13 ENCOUNTER — Other Ambulatory Visit (HOSPITAL_COMMUNITY): Payer: Self-pay

## 2024-05-13 ENCOUNTER — Ambulatory Visit (INDEPENDENT_AMBULATORY_CARE_PROVIDER_SITE_OTHER): Admitting: Nurse Practitioner

## 2024-05-13 ENCOUNTER — Other Ambulatory Visit: Payer: Self-pay

## 2024-05-13 ENCOUNTER — Encounter: Payer: Self-pay | Admitting: Nurse Practitioner

## 2024-05-13 VITALS — BP 120/70 | HR 96 | Resp 16 | Ht 77.0 in | Wt 236.1 lb

## 2024-05-13 DIAGNOSIS — F3181 Bipolar II disorder: Secondary | ICD-10-CM

## 2024-05-13 DIAGNOSIS — E782 Mixed hyperlipidemia: Secondary | ICD-10-CM

## 2024-05-13 DIAGNOSIS — F411 Generalized anxiety disorder: Secondary | ICD-10-CM

## 2024-05-13 DIAGNOSIS — M545 Low back pain, unspecified: Secondary | ICD-10-CM

## 2024-05-13 DIAGNOSIS — I10 Essential (primary) hypertension: Secondary | ICD-10-CM | POA: Diagnosis not present

## 2024-05-13 DIAGNOSIS — G8929 Other chronic pain: Secondary | ICD-10-CM

## 2024-05-13 DIAGNOSIS — M546 Pain in thoracic spine: Secondary | ICD-10-CM

## 2024-05-13 DIAGNOSIS — I252 Old myocardial infarction: Secondary | ICD-10-CM

## 2024-05-13 MED ORDER — LISINOPRIL 2.5 MG PO TABS
2.5000 mg | ORAL_TABLET | Freq: Every day | ORAL | 1 refills | Status: AC
  Filled 2024-05-13 – 2024-06-10 (×2): qty 90, 90d supply, fill #0
  Filled 2024-09-09: qty 90, 90d supply, fill #1

## 2024-05-13 MED ORDER — ASPIRIN 81 MG PO CHEW
81.0000 mg | CHEWABLE_TABLET | Freq: Every day | ORAL | 1 refills | Status: AC
  Filled 2024-05-13: qty 90, 90d supply, fill #0

## 2024-05-13 MED ORDER — METOPROLOL SUCCINATE ER 25 MG PO TB24
12.5000 mg | ORAL_TABLET | Freq: Every day | ORAL | 1 refills | Status: AC
  Filled 2024-05-13 – 2024-06-10 (×2): qty 30, 60d supply, fill #0
  Filled 2024-09-03: qty 30, 60d supply, fill #1

## 2024-05-13 MED ORDER — ROSUVASTATIN CALCIUM 10 MG PO TABS
10.0000 mg | ORAL_TABLET | Freq: Every day | ORAL | 1 refills | Status: AC
  Filled 2024-05-13: qty 90, 90d supply, fill #0

## 2024-05-13 MED ORDER — CYCLOBENZAPRINE HCL 10 MG PO TABS
10.0000 mg | ORAL_TABLET | Freq: Three times a day (TID) | ORAL | 1 refills | Status: AC | PRN
  Filled 2024-05-13: qty 90, 30d supply, fill #0
  Filled 2024-08-05: qty 90, 30d supply, fill #1

## 2024-05-13 MED ORDER — PRASUGREL HCL 10 MG PO TABS
10.0000 mg | ORAL_TABLET | Freq: Every day | ORAL | 1 refills | Status: AC
  Filled 2024-05-13 – 2024-06-10 (×2): qty 90, 90d supply, fill #0
  Filled 2024-09-03: qty 90, 90d supply, fill #1

## 2024-05-13 NOTE — Assessment & Plan Note (Signed)
 Continue to follow-up with Cardiology. Per Cardiology continue taking the prasugrel  10 mg daily for one year.

## 2024-05-13 NOTE — Assessment & Plan Note (Signed)
 Continue taking lisinopril  2.5 mg daily and metoprolol  12.5 mg daily. Continue checking BP at home.

## 2024-05-13 NOTE — Assessment & Plan Note (Signed)
 Flexeril  10 mg sent to pharmacy (take by mouth 3 times daily as needed for back pain).

## 2024-05-13 NOTE — Assessment & Plan Note (Signed)
 Continue seeing Psychiatry and taking your risperidone  0.5 mg tablet at bedtime.

## 2024-05-13 NOTE — Assessment & Plan Note (Signed)
 Continue following up with Cardiology. Refill sent in of the prasugrel  10 mg daily.

## 2024-05-13 NOTE — Assessment & Plan Note (Signed)
 Continue seeing Psychiatry and taking your medications as prescribed.

## 2024-05-13 NOTE — Progress Notes (Signed)
 BP 120/70 (Cuff Size: Large)   Pulse 96   Resp 16   Ht 6' 5 (1.956 m)   Wt 236 lb 1.6 oz (107.1 kg)   SpO2 97%   BMI 28.00 kg/m    Subjective:    Patient ID: Matthew [name], male    DOB: 1971-05-30, 53 y.o.   MRN: [id number]  HPI: Matthew [name] is a 53 y.o. male presenting today for routine follow-up for chronic medical management. Patient has a histroy of coronary artery disease, hypertension, and hx of a myocardia linfarction.   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 Was told by Cardiology to stay on prasugrel  10 mg daily. He needs a refill on the medication.   Hypertension: -Taking Lisinopril  2.5 mg daily and metoprolol  succinate 12.5 mg daily. Reports checking BP at home and readings stay between 120-130/70's. He needs a refill on medications, Denies chest pain, shortness of breath, or palpitations. He is working on limiting salt intake.   Hyperlipidemia: -He stopped taking the atorvastatin  80 mg due to leg cramps. He is interested in trying another cholesterol medication. He is working on limiting saturated fats in his diet.   Bipolar Disorder: Managed by Psychiatry. Taking risperidone  0.5 mg daily.   Anxiety: Managed by Psychiatry.  Taking buspirone  7.5 mg daily and 15 mg at night before bed.         05/13/2024    2:05 PM 04/06/2024    3:39 PM 03/01/2024    4:06 PM  Depression screen PHQ 2/9  Decreased Interest 0    Down, Depressed, Hopeless 0    PHQ - 2 Score 0    Altered sleeping     Tired, decreased energy     Change in appetite     Feeling bad or failure about yourself      Trouble concentrating     Moving slowly or fidgety/restless     Suicidal thoughts     PHQ-9 Score     Difficult doing work/chores        Information is confidential and restricted. Go to Review Flowsheets to unlock data.    Relevant past medical, surgical, family and social history reviewed and updated as indicated.  Interim medical history since our last visit reviewed. Allergies and medications reviewed and updated.  Review of Systems Ten systems reviewed and is negative except as mentioned in HPI       Objective:     BP 120/70 (Cuff Size: Large)   Pulse 96   Resp 16   Ht 6' 5 (1.956 m)   Wt 236 lb 1.6 oz (107.1 kg)   SpO2 97%   BMI 28.00 kg/m    Wt Readings from Last 3 Encounters:  05/13/24 236 lb 1.6 oz (107.1 kg)  04/26/24 239 lb 3.2 oz (108.5 kg)  03/09/24 235 lb (106.6 kg)    Physical Exam Constitutional:      Appearance: Normal appearance.  HENT:     Head: Normocephalic and atraumatic.  Eyes:     Pupils: Pupils are equal, round, and reactive to light.  Cardiovascular:     Rate and Rhythm: Normal rate and regular rhythm.     Pulses: Normal pulses.     Heart sounds: Normal heart sounds.  Pulmonary:     Effort: Pulmonary effort is normal.     Breath sounds: Examination of the right-upper field reveals wheezing. Examination of the left-upper field reveals wheezing. Examination of  the right-middle field reveals wheezing. Examination of the left-middle field reveals wheezing. Wheezing present.  Musculoskeletal:        General: Normal range of motion.     Cervical back: Normal range of motion and neck supple.  Skin:    General: Skin is warm and dry.  Neurological:     General: No focal deficit present.     Mental Status: He is alert and oriented to person, place, and time.  Psychiatric:        Mood and Affect: Mood normal.        Behavior: Behavior normal.        Thought Content: Thought content normal.        Judgment: Judgment normal.      Results for orders placed or performed during the hospital encounter of 03/09/24  Basic metabolic panel   Collection Time: 03/09/24  8:10 PM  Result Value Ref Range   Sodium 138 135 - 145 mmol/L   Potassium 3.6 3.5 - 5.1 mmol/L   Chloride 104 98 - 111 mmol/L   CO2 23 22 - 32 mmol/L   Glucose, Bld 111 (H) 70 - 99 mg/dL   BUN 6 6  - 20 mg/dL   Creatinine, Ser 9.20 0.61 - 1.24 mg/dL   Calcium  9.3 8.9 - 10.3 mg/dL   GFR, Estimated >39 >39 mL/min   Anion gap 11 5 - 15  CBC   Collection Time: 03/09/24  8:10 PM  Result Value Ref Range   WBC 9.1 4.0 - 10.5 K/uL   RBC 4.55 4.22 - 5.81 MIL/uL   Hemoglobin 15.3 13.0 - 17.0 g/dL   HCT 58.4 60.9 - 47.9 %   MCV 91.2 80.0 - 100.0 fL   MCH 33.6 26.0 - 34.0 pg   MCHC 36.9 (H) 30.0 - 36.0 g/dL   RDW 87.8 88.4 - 84.4 %   Platelets 229 150 - 400 K/uL   nRBC 0.0 0.0 - 0.2 %  Troponin I (High Sensitivity)   Collection Time: 03/09/24  8:10 PM  Result Value Ref Range   Troponin I (High Sensitivity) 8 <18 ng/L  Troponin I (High Sensitivity)   Collection Time: 03/09/24 10:35 PM  Result Value Ref Range   Troponin I (High Sensitivity) 9 <18 ng/L          Assessment & Plan:   Problem List Items Addressed This Visit       Cardiovascular and Mediastinum   Essential hypertension - Primary   Continue taking lisinopril  2.5 mg daily and metoprolol  12.5 mg daily. Continue checking BP at home.       Relevant Medications   metoprolol  succinate (TOPROL -XL) 25 MG 24 hr tablet   lisinopril  (ZESTRIL ) 2.5 MG tablet   aspirin  ([employer] ASPIRIN  ADULT LOW DOSE) 81 MG chewable tablet   rosuvastatin  (CRESTOR ) 10 MG tablet     Other   Back pain, thoracic   Relevant Medications   aspirin  ([employer] ASPIRIN  ADULT LOW DOSE) 81 MG chewable tablet   cyclobenzaprine  (FLEXERIL ) 10 MG tablet   Low back pain   Flexeril  10 mg sent to pharmacy (take by mouth 3 times daily as needed for back pain).       Relevant Medications   aspirin  ([employer] ASPIRIN  ADULT LOW DOSE) 81 MG chewable tablet   cyclobenzaprine  (FLEXERIL ) 10 MG tablet   Bipolar 2 disorder, major depressive episode (HCC)   Continue seeing Psychiatry and taking your risperidone  0.5 mg tablet at bedtime.  GAD (generalized anxiety disorder)   Continue seeing Psychiatry and taking your medications as prescribed.       History of MI  (myocardial infarction)   Continue to follow-up with Cardiology. Per Cardiology continue taking the prasugrel  10 mg daily for one year.       Relevant Medications   prasugrel  (EFFIENT ) 10 MG TABS tablet   metoprolol  succinate (TOPROL -XL) 25 MG 24 hr tablet   lisinopril  (ZESTRIL ) 2.5 MG tablet   aspirin  ([employer]S ASPIRIN  ADULT LOW DOSE) 81 MG chewable tablet   rosuvastatin  (CRESTOR ) 10 MG tablet   Other Visit Diagnoses       Mixed hyperlipidemia       Relevant Medications   metoprolol  succinate (TOPROL -XL) 25 MG 24 hr tablet   lisinopril  (ZESTRIL ) 2.5 MG tablet   aspirin  ([employer]S ASPIRIN  ADULT LOW DOSE) 81 MG chewable tablet   rosuvastatin  (CRESTOR ) 10 MG tablet       -Labwork to be done at next follow-up visit in 6 months since patient recently had labwork done -Continue taking medications as prescribed, Rosuvastatin  10 mg sent in instead of the Atorvastatin  80 mg to see if this helps with leg cramps. Please follow-up if cramps/new symptoms appear.  -Follow-up with Cardiology and Pulmonology -Complete the sleep study        Follow up plan: Return in about 6 months (around 11/10/2024) for follow up.   I have reviewed this encounter including the documentation in this note and/or discussed this patient with the provider, Aislinn Womack, SNP, I am certifying that I agree with the content of this note as supervising/preceptor nurse practitioner.  Mliss Spray, FNP-C [facility] 05/13/2024, 3:38 PM

## 2024-05-14 ENCOUNTER — Other Ambulatory Visit (HOSPITAL_COMMUNITY): Payer: Self-pay

## 2024-05-14 ENCOUNTER — Other Ambulatory Visit: Payer: Self-pay

## 2024-05-27 ENCOUNTER — Ambulatory Visit (INDEPENDENT_AMBULATORY_CARE_PROVIDER_SITE_OTHER): Admitting: Licensed Clinical Social Worker

## 2024-05-27 ENCOUNTER — Encounter: Payer: Self-pay | Admitting: Licensed Clinical Social Worker

## 2024-05-27 DIAGNOSIS — F4310 Post-traumatic stress disorder, unspecified: Secondary | ICD-10-CM | POA: Diagnosis not present

## 2024-05-27 DIAGNOSIS — F410 Panic disorder [episodic paroxysmal anxiety] without agoraphobia: Secondary | ICD-10-CM

## 2024-05-27 DIAGNOSIS — F3181 Bipolar II disorder: Secondary | ICD-10-CM

## 2024-05-27 DIAGNOSIS — F411 Generalized anxiety disorder: Secondary | ICD-10-CM | POA: Diagnosis not present

## 2024-05-27 NOTE — Progress Notes (Signed)
 THERAPIST PROGRESS NOTE  Progress Review  Session Time: 11:03-12:02pm  Participation Level: Active  Behavioral Response: CasualAlertDepressed  Type of Therapy: Individual Therapy  Treatment Goals addressed:  Active     Anxiety     LTG: [name] will score less than 5 on the Generalized Anxiety Disorder 7 Scale (GAD-7)  (Not Progressing)     Start:  09/17/23    Expected End:  08/27/24       Goal Note     05/27/24: Patient reports his anxiety has been higher as a result of work and home life stressors.          STG: Report a 50% reduction in the frequency of rehearsing thoughts related to social interactions. (Progressing)     Start:  09/17/23    Expected End:  08/27/24         Perform psychoeducation regarding anxiety disorders     Start:  09/17/23         Work with patient individually to identify the major components of a recent episode of anxiety: physical symptoms, major thoughts and images, and major behaviors they experienced     Start:  09/17/23         Coping Skills     Start:  09/17/23       Will work with the pt using CBT/DBT techniques to help the pt verbalize an understanding of the cognitive, physiological, and behavioral components of anxiety and its treatment. This will be done by using worksheets, interactive activities, CBT/ABC thought logs, modeling, homework, role playing and journaling. Will work with pt to learn and implement coping skills that result in a reduction of anxiety and improve daily functioning per pt self report 3 out of 5 documented sessions.       Participating in weekly Cognitive-Behavioral Therapy (CBT) sessions to focus on cognitive restructuring and address irrational fears.       Start:  09/17/23            Identify and challenge negative thoughts: Recognize distorted or unhelpful thinking patterns and substitute them with more balanced and constructive alternatives.       Start:  09/17/23              Social  Interpersonal Effectiveness     LTG: Andrian will recognize socially inappropriate behaviors and develop alternative behaviors (Not Progressing)     Start:  09/17/23    Expected End:  08/27/24         STG: Matthew [name] will demonstrate interest in social activities by initiating/joining social activity without prompts (Not Progressing)     Start:  09/17/23    Expected End:  08/27/24         STG: Matthew [name] will identify 2 behaviors that engage in social isolation  (Progressing)     Start:  09/17/23    Expected End:  08/27/24       Goal Note     05/27/24: Patient demonstrated progress in his ability to acknowledge changes in his communication and triggering situation which would lead to isolation.          Educate Matthew [name] on appropriate behaviors and boundaries     Start:  09/17/23         Facilitate discussions with Gerell regarding miscommunications that occur during social interactions     Start:  09/17/23         Educate Hershall on aggressive, passive,and assertive communication styles     Start:  09/17/23  ProgressTowards Goals: Progressing/Variable   Interventions: CBT, Solution Focused, Assertiveness Training, Supportive, and Reframing  Summary: Matthew VILLWOCK [name] is a 53 y.o. male who presents with low mood, lack of motivation, difficulty concentrating, restlessness, uncontrollable worry, anxious feelings, ruminating thoughts, irritability, difficulty sleeping, tension, and suicidal thoughts. Pt was oriented times 5. Pt was cooperative and engaged. Pt denies SI/HI/AVH.    Cln utilized the first half of the session to review the patient's progress, as documented in the progress notes above.  The clinician readministered the PHQ-9 and GAD-7 assessments. The patient's anxiety scores increased from 16 to 19, and depression scores also rose from 10 to 22. During the session, the patient shared that a recent stressor occurred on Sunday afternoon, involving an verbal  altercation between his wife and daughter regarding the daughter's behaviors. The patient reported that he often intervenes in these situations to de-escalate tensions. However, he noted that his involvement complicates matters further, leaving him feeling even more distressed, which triggered a SI.The clinician assessed the patient for trauma symptoms and provided brief psychoeducation on how the patient's nervous system remains activated due to reminders of childhood stressors. The clinician strongly encouraged the patient to find ways to distance himself from stressful situations, although the patient expressed guilt about not protecting his daughter's emotions. Together, the clinician and the patient explored ways he could offer support while also prioritizing his own mental health.  The patient continues to process support and communication within his marriage, noting that his spouse is currently hesitant to engage in certain collaborative solutions but has presented her own ideas to improve their dynamics. Both parties expressed mutual concerns regarding their connection. The patient demonstrated improvements in his ability to use assertive communication while remaining calm, which has helped him establish healthier boundaries.  The patient reported increased work stress, feeling overwhelmed and experiencing difficulty focusing, poor task completion, low confidence, low energy, and anxiety. The clinician and patient explored the difference between good and bad stress, with the patient identifying that previous roles led him to experience good stress, while his current role is a source of bad stress. He expressed feeling stuck and unable to look for another job at this time.  The clinician challenged the patient to identify ways to remove stressors from his current routine in an effort to aid in regulating his nervous system. The patient agreed to this approach.   Completed with: Matthew [name] LITTIE Rudder  [name] Date: 05/27/2024 11:57 AM  Patient expressed: He wanted to stab himself over the weekend Suicidal ideation Suicide plan Stab Himself Self-harm thoughts  Increased risk due to: Feelings of hopelessness and/or worthlessness, Verbalizes plan for suicide, Poor interpersonal relationships and support system, Recent stressful life event, and Feelings of depression and/or anhedonia Mitigating factors include: Responsible for children under 93 years of age Sense of responsibility to family Employed Living with another person, especially a relative Positive therapeutic relationship Positive coping skills or problem-solving skills  Warning Signs discussed with patient: 1) Getting Hot 2) Angry  3) Ear Ringing 4)Heart Palpitations  Coping Strategies:  1) Remove himself from stressor (Going for a drive around town) 2) Go to a park  3) [employer] on the face  People to help and assist with distraction:  1) Chyrl (Friend) [phone] 2) Ed Pricilla) [phone]  Professionals Available:  Agency:  [employer] Clinician: Evalene Husband  Emergency: Please call 911  Suicide Hotline 1-800-273-TALK 820-389-8322) [facility]: Phone [phone]) [phone] Address [address] [place], [place] [address] Hours Open 24/7. No appointment  required.  Reasons for living mentioned by the patient: For his daughter and granddaughter.   Phone Number to [facility] Suicide Hotline provided to patient as well as information for [facility].    Suicidal/Homicidal: Patient reports on Sunday, September fifth, he had thoughts about taking his life by stabbing himself.  Patient denies intent reporting he was able to distract himself and ideation subsided.  Patient was able to reflect on triggers for the ideation on Sunday and was able to process coping strategies he could put in place should that incident arise again in the future.  Patient and clinician safety planned and patient was provided with a copy of the safety plan to take  home.  Therapist Response: During the session, the clinician employed active listening to create a safe environment for the patient to process recent life events. The clinician assessed the patient's current symptoms, stressors, and safety since the last session.  Explored patient's use of assertive communication to establish healthier lines of communication.  Identified stressors and ways in which patient can mitigate/cope with stressors.  Identified possible solutions to problems patient identified.  Safety planned with patient.  Plan: Return again in 2 weeks.  Diagnosis:PTSD (post-traumatic stress disorder)  Bipolar 2 disorder (HCC)  GAD (generalized anxiety disorder)  Panic 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   Collaboration of Care: Psychiatrist AEB AEB psychiatrist can access notes and cln. Will review psychiatrists' notes. Check in with the patient and will see LCSW per availability. Patient agreed with treatment recommendations.   Patient/Guardian was advised Release of Information must be obtained prior to any record release in order to collaborate their care with an outside provider. Patient/Guardian was advised if they have not already done so to contact the registration department to sign all necessary forms in order for us  to release information regarding their care.   Consent: Patient/Guardian gives verbal consent for treatment and assignment of benefits for services provided during this visit. Patient/Guardian expressed understanding and agreed to proceed.   Evalene KATHEE Husband, LCSW 05/27/2024

## 2024-05-28 DIAGNOSIS — I10 Essential (primary) hypertension: Secondary | ICD-10-CM | POA: Diagnosis not present

## 2024-05-28 DIAGNOSIS — Z720 Tobacco use: Secondary | ICD-10-CM | POA: Diagnosis not present

## 2024-05-28 DIAGNOSIS — I2119 ST elevation (STEMI) myocardial infarction involving other coronary artery of inferior wall: Secondary | ICD-10-CM | POA: Diagnosis not present
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
## 2024-05-31 ENCOUNTER — Other Ambulatory Visit: Payer: Self-pay | Admitting: Psychiatry

## 2024-05-31 ENCOUNTER — Other Ambulatory Visit (HOSPITAL_COMMUNITY): Payer: Self-pay

## 2024-05-31 ENCOUNTER — Telehealth: Payer: Self-pay | Admitting: Psychiatry

## 2024-05-31 NOTE — Telephone Encounter (Signed)
 Please advise the patient that we would like him to be on a therapeutic dose of risperidone  before initiating any antidepressant, in order to reduce the risk of side effects. Please advise him to continue his current medications as prescribed, and we will plan to discuss further treatment options at his next visit.

## 2024-05-31 NOTE — Telephone Encounter (Signed)
 Called patient to make aware of the message fro provider no answer unable to leave a voicemail due to phone continuing to ring

## 2024-06-03 ENCOUNTER — Encounter

## 2024-06-03 ENCOUNTER — Other Ambulatory Visit: Payer: Self-pay

## 2024-06-03 ENCOUNTER — Encounter: Payer: Self-pay | Admitting: Psychiatry

## 2024-06-03 ENCOUNTER — Ambulatory Visit (INDEPENDENT_AMBULATORY_CARE_PROVIDER_SITE_OTHER): Admitting: Psychiatry

## 2024-06-03 ENCOUNTER — Other Ambulatory Visit (HOSPITAL_COMMUNITY): Payer: Self-pay

## 2024-06-03 VITALS — BP 136/87 | HR 87 | Temp 97.4°F | Ht 77.0 in | Wt 239.2 lb

## 2024-06-03 DIAGNOSIS — F410 Panic disorder [episodic paroxysmal anxiety] without agoraphobia: Secondary | ICD-10-CM | POA: Diagnosis not present

## 2024-06-03 DIAGNOSIS — G4730 Sleep apnea, unspecified: Secondary | ICD-10-CM | POA: Diagnosis not present

## 2024-06-03 DIAGNOSIS — F4310 Post-traumatic stress disorder, unspecified: Secondary | ICD-10-CM

## 2024-06-03 DIAGNOSIS — F3181 Bipolar II disorder: Secondary | ICD-10-CM | POA: Diagnosis not present

## 2024-06-03 DIAGNOSIS — G4700 Insomnia, unspecified: Secondary | ICD-10-CM

## 2024-06-03 DIAGNOSIS — F411 Generalized anxiety disorder: Secondary | ICD-10-CM

## 2024-06-03 DIAGNOSIS — G4733 Obstructive sleep apnea (adult) (pediatric): Secondary | ICD-10-CM

## 2024-06-03 MED ORDER — RISPERIDONE 1 MG PO TABS
1.0000 mg | ORAL_TABLET | Freq: Every day | ORAL | 1 refills | Status: DC
  Filled 2024-06-03: qty 30, 30d supply, fill #0
  Filled 2024-07-02: qty 30, 30d supply, fill #1

## 2024-06-03 MED ORDER — LORAZEPAM 1 MG PO TABS
ORAL_TABLET | ORAL | 1 refills | Status: DC
  Filled 2024-06-03: qty 75, 30d supply, fill #0
  Filled 2024-07-05 (×2): qty 75, 30d supply, fill #1

## 2024-06-03 NOTE — Patient Instructions (Signed)
 Continue buspirone  7.5 mg twice a day, 15 mg at night  Increase risperidone  1 mg at night  Continue lorazepam  1 mg twice a day as needed for anxiety, 75 tablets per month Next appointment: 12/2 at 11 am

## 2024-06-04 ENCOUNTER — Encounter: Payer: Self-pay | Admitting: Internal Medicine

## 2024-06-04 ENCOUNTER — Ambulatory Visit: Admitting: Internal Medicine

## 2024-06-04 ENCOUNTER — Other Ambulatory Visit (HOSPITAL_COMMUNITY): Payer: Self-pay

## 2024-06-04 VITALS — BP 120/80 | HR 90 | Temp 98.1°F | Ht 77.0 in | Wt 238.2 lb

## 2024-06-04 DIAGNOSIS — F17210 Nicotine dependence, cigarettes, uncomplicated: Secondary | ICD-10-CM | POA: Diagnosis not present

## 2024-06-04 DIAGNOSIS — J449 Chronic obstructive pulmonary disease, unspecified: Secondary | ICD-10-CM

## 2024-06-04 MED ORDER — FLUTICASONE-SALMETEROL 230-21 MCG/ACT IN AERO
2.0000 | INHALATION_SPRAY | Freq: Two times a day (BID) | RESPIRATORY_TRACT | 12 refills | Status: AC
  Filled 2024-06-04: qty 12, 30d supply, fill #0
  Filled 2024-07-02: qty 12, 30d supply, fill #1
  Filled 2024-08-18: qty 12, 30d supply, fill #2

## 2024-06-04 NOTE — Progress Notes (Unsigned)
 [facility] Pulmonary Medicine Consultation      Date: 06/04/2024,   MRN# [id number] Matthew [name] 28-Sep-1970    CHIEF COMPLAINT:   Assessment of COPD   HISTORY OF PRESENT ILLNESS   53 yo white male seen today for progressive shortness of breath chest congestion wheezing definitely related to his ongoing tobacco abuse with a history of extensive smoking history  I have explained to him that findings are highly consistent of COPD Recommend planning to obtain pulmonary function testing and to start inhaler therapy  No exacerbation at this time No evidence of heart failure at this time No evidence or signs of infection at this time No respiratory distress No fevers, chills, nausea, vomiting, diarrhea No evidence of lower extremity edema No evidence hemoptysis  1 pack a day for the last 40 years smoking history  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 Jess for assessment for sleep apnea Patient is at high risk for lung cancer and I have talked to him about lung cancer screening program  Smoking cessation strongly advised   Cardiac history reviewed in detail recurrent after recent inferior STEMI, primary PCI with DES proximal RCA, with nondiagnostic ECG, elevated troponin consistent with recent inferior STEMI, chest pain resolved off of heparin  Inferior STEMI 10/15/2023, status post primary PCI with DES proximal RCA Despite having history of MI patient continues to smoke PAST MEDICAL HISTORY   Past Medical History:  Diagnosis Date   Acute kidney failure 01/2012   Anxiety    Bipolar disorder (HCC)    hypomania   COPD (chronic obstructive pulmonary disease) (HCC)    Depression    Hyperlipidemia    Hypertension    Low back pain      SURGICAL HISTORY   Past Surgical History:  Procedure Laterality Date   CORONARY/GRAFT ACUTE MI REVASCULARIZATION N/A 10/15/2023   Procedure: Coronary/Graft Acute MI Revascularization;   Surgeon: Ammon Blunt, MD;  Location: [facility] INVASIVE CV LAB;  Service: Cardiovascular;  Laterality: N/A;   LEFT HEART CATH AND CORONARY ANGIOGRAPHY N/A 10/15/2023   Procedure: LEFT HEART CATH AND CORONARY ANGIOGRAPHY;  Surgeon: Ammon Blunt, MD;  Location: [facility] INVASIVE CV LAB;  Service: Cardiovascular;  Laterality: N/A;   NO PAST SURGERIES       FAMILY HISTORY   Family History  Problem Relation Age of Onset   Anxiety disorder Mother    Alcohol abuse Father    Drug abuse Father    Bipolar disorder Sister    Alcohol abuse Brother    Drug abuse Brother    Bipolar disorder Paternal Aunt    Schizophrenia Paternal Aunt    Hypertension Maternal Grandfather    Depression Maternal Grandmother    Cancer Maternal Grandmother        lung   Hypertension Paternal Grandfather    Cancer Paternal Grandmother        lung   Diabetes Neg Hx    Heart disease Neg Hx    Hyperlipidemia Neg Hx    Stroke Neg Hx    Suicidality Neg Hx      SOCIAL HISTORY   Social History   Tobacco Use   Smoking status: Every Day    Current packs/day: 2.00    Average packs/day: 2.0 packs/day for 20.0 years (40.0 ttl pk-yrs)    Types: Cigarettes   Smokeless tobacco: Never  Vaping Use   Vaping status: Never Used  Substance Use Topics   Alcohol use: Not Currently  Comment: socially   Drug use: Yes    Types: Marijuana    Comment: daily- quit 1 month ago     MEDICATIONS    Home Medication:  Current Outpatient Rx   Order #: [id number] Class: Normal   Order #: [id number] Class: Normal   Order #: [id number] Class: Normal   Order #: [id number] Class: Normal   Order #: [id number] Class: Normal   Order #: [id number] Class: Normal   Order #: [id number] Class: Normal   Order #: [id number] Class: Normal   Order #: [id number] Class: Normal   Order #: [id number] Class: Normal    Current Medication:  Current Outpatient Medications:    aspirin  ([facility] ASPIRIN  ADULT LOW DOSE) 81 MG chewable tablet, Chew 1 tablet  (81 mg total) by mouth daily., Disp: 90 tablet, Rfl: 1   busPIRone  (BUSPAR ) 15 MG tablet, TAKE 0.5 TABLETS (7.5 MG TOTAL) BY MOUTH DAILY AND 1 TABLET (15 MG TOTAL) EVERY EVENING., Disp: 135 tablet, Rfl: 0   cyclobenzaprine  (FLEXERIL ) 10 MG tablet, Take 1 tablet (10 mg total) by mouth 3 (three) times daily as needed for muscle spasms., Disp: 90 tablet, Rfl: 1   lisinopril  (ZESTRIL ) 2.5 MG tablet, Take 1 tablet (2.5 mg total) by mouth at bedtime., Disp: 90 tablet, Rfl: 1   LORazepam  (ATIVAN ) 1 MG tablet, Take 1 tablet by mouth 2 (two) times daily. May also take 1 tablet every other day as needed for anxiety. Do all this for 60 days. Indications: Feeling Anxious, Disp: 75 tablet, Rfl: 1   metoprolol  succinate (TOPROL -XL) 25 MG 24 hr tablet, Take 0.5 tablets (12.5 mg total) by mouth daily., Disp: 30 tablet, Rfl: 1   prasugrel  (EFFIENT ) 10 MG TABS tablet, Take 1 tablet (10 mg total) by mouth daily., Disp: 90 tablet, Rfl: 1   risperiDONE  (RISPERDAL ) 0.5 MG tablet, Take 1 tablet (0.5 mg total) by mouth at bedtime., Disp: 30 tablet, Rfl: 0   risperiDONE  (RISPERDAL ) 1 MG tablet, Take 1 tablet (1 mg total) by mouth at bedtime., Disp: 30 tablet, Rfl: 1   rosuvastatin  (CRESTOR ) 10 MG tablet, Take 1 tablet (10 mg total) by mouth daily., Disp: 90 tablet, Rfl: 1    ALLERGIES   Ciprofloxacin  BP 120/80   Pulse 90   Temp 98.1 F (36.7 C)   Ht 6' 5 (1.956 m)   Wt 238 lb 3.2 oz (108 kg)   SpO2 97%   BMI 28.25 kg/m     Review of Systems: Gen:  Denies  fever, sweats, chills weight loss  HEENT: Denies blurred vision, double vision, ear pain, eye pain, hearing loss, nose bleeds, sore throat Cardiac:  No dizziness, chest pain or heaviness, chest tightness,edema, No JVD Resp:   No cough, -sputum production, -shortness of breath,-wheezing, -hemoptysis,  Other:  All other systems negative   Physical Examination:   General Appearance: No distress  EYES PERRLA, EOM intact.   NECK Supple, No  JVD Pulmonary: normal breath sounds, No wheezing.  CardiovascularNormal S1,S2.  No m/r/g.   Abdomen: Benign, Soft, non-tender. Neurology UE/LE 5/5 strength, no focal deficits Ext pulses intact, cap refill intact ALL OTHER ROS ARE NEGATIVE       ASSESSMENT/PLAN   53 year old white male seen today for assessment of shortness of breath with extensive smoking history with signs and symptoms of chest congestion wheezing cough most likely associated with underlying COPD, patient with history of MI CAD drug-eluting stent  Assessment of COPD Recommend pulmonary function testing to assess for lung function Recommend Advair HFA 230-2 puffs in the morning  2 puffs at night Recommend rinsing mouth after use Avoid Allergens and Irritants Avoid secondhand smoke Avoid SICK contacts Recommend  Masking  when appropriate Recommend Keep up-to-date with vaccinations   Assessment of OSA follow-up Dr. Jess   Smoking Assessment and Cessation Counseling Upon further questioning, Patient smokes 1 ppd I have advised patient to quit/stop smoking as soon as possible due to high risk for multiple medical problems Patient is NOT willing to quit smoking I have advised patient that we can assist and have options of Nicotine  replacement therapy. I also advised patient on behavioral therapy and can provide oral medication therapy in conjunction with the other therapies Follow up next Office visit  for assessment of smoking cessation Smoking cessation counseling advised for 12 minutes  Extensive smoking history Patient is a candidate for lung cancer screening program Follow-up LDCT annually    History of MI with CAD with stent Active smoking Follow-up cardiology      MEDICATION ADJUSTMENTS/LABS AND TESTS ORDERED: Lung cancer screening program Advair HFA Rinse mouth after use Pulmonary function testing Home sleep test pending Smoking cessation advised    CURRENT MEDICATIONS REVIEWED AT  LENGTH WITH PATIENT TODAY   Patient  satisfied with Plan of action and management. All questions answered   Follow up 3 months   I spent a total of 48 minutes dedicated to the care of this patient on the date of this encounter to include pre-visit review of records, face-to-face time with the patient discussing conditions above, post visit ordering of testing, clinical documentation with the electronic health record, making appropriate referrals as documented, and communicating necessary information to the patient's healthcare team.    The Patient requires high complexity decision making for assessment and support, frequent evaluation and titration of therapies, application of advanced monitoring technologies and extensive interpretation of multiple databases.  Patient satisfied with Plan of action and management. All questions answered    Nickolas Alm Cellar, M.D.  [facility] Pulmonary & Critical Care Medicine  Medical Director [facility] [place]

## 2024-06-04 NOTE — Patient Instructions (Addendum)
 Please stop smoking!  Start Advair HFA-2 puffs in the morning 2 puffs at night  Recommend breathing test to assess lung function pulmonary function testing  Recommend lung cancer screening program  You are at High risk for lung cancer  Avoid Allergens and Irritants Avoid secondhand smoke Avoid SICK contacts Recommend  Masking  when appropriate Recommend Keep up-to-date with vaccinations

## 2024-06-09 ENCOUNTER — Other Ambulatory Visit: Payer: Self-pay | Admitting: Nurse Practitioner

## 2024-06-09 DIAGNOSIS — I252 Old myocardial infarction: Secondary | ICD-10-CM

## 2024-06-10 ENCOUNTER — Other Ambulatory Visit: Payer: Self-pay

## 2024-06-10 ENCOUNTER — Other Ambulatory Visit (HOSPITAL_COMMUNITY): Payer: Self-pay

## 2024-06-10 NOTE — Telephone Encounter (Signed)
 Requested Prescriptions  Refused Prescriptions Disp Refills   lisinopril  (ZESTRIL ) 2.5 MG tablet [Pharmacy Med Name: LISINOPRIL  2.5 MG TABLET] 30 tablet     Sig: TAKE 1 TABLET BY MOUTH EVERYDAY AT BEDTIME     Cardiovascular:  ACE Inhibitors Passed - 06/10/2024  4:01 PM      Passed - Cr in normal range and within 180 days    Creat  Date Value Ref Range Status  04/07/2020 0.87 0.60 - 1.35 mg/dL Final   Creatinine, Ser  Date Value Ref Range Status  03/09/2024 0.79 0.61 - 1.24 mg/dL Final   Creatinine,U  Date Value Ref Range Status  02/03/2014 147.09 mg/dL Final    Comment:       Cutoff Values for Urine Drug Screen, Pain Mgmt          Drug Class           Cutoff (ng/mL)          Amphetamines             500          Barbiturates             200          Cocaine Metabolites      150          Benzodiazepines          200          Methadone                300          Opiates                  300          Phencyclidine             25          Propoxyphene             300          Marijuana Metabolites     50    For medical purposes only.         Passed - K in normal range and within 180 days    Potassium  Date Value Ref Range Status  03/09/2024 3.6 3.5 - 5.1 mmol/L Final         Passed - Patient is not pregnant      Passed - Last BP in normal range    BP Readings from Last 1 Encounters:  06/04/24 120/80         Passed - Valid encounter within last 6 months    Recent Outpatient Visits           4 weeks ago Essential hypertension   [facility] Gareth Clarity F, FNP   7 months ago History of MI (myocardial infarction)   [facility] Gareth Clarity FALCON, FNP               prasugrel  (EFFIENT ) 10 MG TABS tablet [Pharmacy Med Name: PRASUGREL  10 MG TABLET] 30 tablet     Sig: TAKE 1 TABLET BY MOUTH EVERY DAY     Hematology:  Antiplatelets Passed - 06/10/2024  4:01 PM      Passed - HGB in normal range and within 180  days    Hemoglobin  Date Value Ref Range Status  03/09/2024 15.3 13.0 - 17.0 g/dL Final  92/74/7980 84.9 13.0 - 17.7 g/dL  Final         Passed - HCT in normal range and within 180 days    HCT  Date Value Ref Range Status  03/09/2024 41.5 39.0 - 52.0 % Final   Hematocrit  Date Value Ref Range Status  03/12/2018 43.9 37.5 - 51.0 % Final         Passed - PLT in normal range and within 180 days    Platelets  Date Value Ref Range Status  03/09/2024 229 150 - 400 K/uL Final  03/12/2018 235 150 - 450 x10E3/uL Final         Passed - Valid encounter within last 6 months    Recent Outpatient Visits           4 weeks ago Essential hypertension   [facility] Gareth Clarity F, FNP   7 months ago History of MI (myocardial infarction)   [facility] Gareth Clarity FALCON, FNP               metoprolol  succinate (TOPROL -XL) 25 MG 24 hr tablet [Pharmacy Med Name: METOPROLOL  SUCC ER 25 MG TAB] 15 tablet     Sig: TAKE 1/2 TABLET BY MOUTH DAILY     Cardiovascular:  Beta Blockers Passed - 06/10/2024  4:01 PM      Passed - Last BP in normal range    BP Readings from Last 1 Encounters:  06/04/24 120/80         Passed - Last Heart Rate in normal range    Pulse Readings from Last 1 Encounters:  06/04/24 90         Passed - Valid encounter within last 6 months    Recent Outpatient Visits           4 weeks ago Essential hypertension   [facility] Gareth Clarity F, FNP   7 months ago History of MI (myocardial infarction)   [facility] Gareth Clarity FALCON, [place]

## 2024-06-11 ENCOUNTER — Ambulatory Visit (INDEPENDENT_AMBULATORY_CARE_PROVIDER_SITE_OTHER): Admitting: Licensed Clinical Social Worker

## 2024-06-11 DIAGNOSIS — F3181 Bipolar II disorder: Secondary | ICD-10-CM

## 2024-06-11 DIAGNOSIS — F410 Panic disorder [episodic paroxysmal anxiety] without agoraphobia: Secondary | ICD-10-CM

## 2024-06-11 DIAGNOSIS — F411 Generalized anxiety disorder: Secondary | ICD-10-CM | POA: Diagnosis not present

## 2024-06-11 DIAGNOSIS — F4310 Post-traumatic stress disorder, unspecified: Secondary | ICD-10-CM | POA: Diagnosis not present

## 2024-06-11 NOTE — Progress Notes (Signed)
 THERAPIST PROGRESS NOTE  Virtual Visit via Video Note  I connected with Matthew [name] on 06/11/24 at  9:00 AM EDT by a video enabled telemedicine application and verified that I am speaking with the correct person using two identifiers.  Location: Patient: Work Address Psychiatrist) Provider: Providers Address   I discussed the limitations of evaluation and management by telemedicine and the availability of in person appointments. The patient expressed understanding and agreed to proceed.  I discussed the assessment and treatment plan with the patient. The patient was provided an opportunity to ask questions and all were answered. The patient agreed with the plan and demonstrated an understanding of the instructions.   The patient was advised to call back or seek an in-person evaluation if the symptoms worsen or if the condition fails to improve as anticipated.  I provided 46 minutes of non-face-to-face time during this encounter.   Matthew [name] Husband, LCSW   Session Time: 9-9:46am  Participation Level: Active  Behavioral Response: CasualAlertEuthymic  Type of Therapy: Individual Therapy  Treatment Goals addressed:  Active     Anxiety     LTG: Sayre will score less than 5 on the Generalized Anxiety Disorder 7 Scale (GAD-7)  (Not Progressing)     Start:  09/17/23    Expected End:  08/27/24       Goal Note     05/27/24: Patient reports his anxiety has been higher as a result of work and home life stressors.          STG: Report a 50% reduction in the frequency of rehearsing thoughts related to social interactions. (Progressing)     Start:  09/17/23    Expected End:  08/27/24         Perform psychoeducation regarding anxiety disorders     Start:  09/17/23         Work with patient individually to identify the major components of a recent episode of anxiety: physical symptoms, major thoughts and images, and major behaviors they experienced      Start:  09/17/23         Coping Skills     Start:  09/17/23       Will work with the pt using CBT/DBT techniques to help the pt verbalize an understanding of the cognitive, physiological, and behavioral components of anxiety and its treatment. This will be done by using worksheets, interactive activities, CBT/ABC thought logs, modeling, homework, role playing and journaling. Will work with pt to learn and implement coping skills that result in a reduction of anxiety and improve daily functioning per pt self report 3 out of 5 documented sessions.       Participating in weekly Cognitive-Behavioral Therapy (CBT) sessions to focus on cognitive restructuring and address irrational fears.       Start:  09/17/23            Identify and challenge negative thoughts: Recognize distorted or unhelpful thinking patterns and substitute them with more balanced and constructive alternatives.       Start:  09/17/23              Social Interpersonal Effectiveness     LTG: Deyonte will recognize socially inappropriate behaviors and develop alternative behaviors (Not Progressing)     Start:  09/17/23    Expected End:  08/27/24         STG: Matthew will demonstrate interest in social activities by initiating/joining social activity without prompts (Not Progressing)  Start:  09/17/23    Expected End:  08/27/24         STG: Nickalos will identify 2 behaviors that engage in social isolation  (Progressing)     Start:  09/17/23    Expected End:  08/27/24       Goal Note     05/27/24: Patient demonstrated progress in his ability to acknowledge changes in his communication and triggering situation which would lead to isolation.          Educate Matthew on appropriate behaviors and boundaries     Start:  09/17/23         Facilitate discussions with Daylyn regarding miscommunications that occur during social interactions     Start:  09/17/23         Educate Ankit on aggressive,  passive,and assertive communication styles     Start:  09/17/23          ProgressTowards Goals: Progressing  Interventions: CBT, Assertiveness Training, Supportive, and Reframing  Summary:  Matthew [name] is a 53 y.o. male who presents with low mood, lack of motivation, difficulty concentrating, restlessness, uncontrollable worry, anxious feelings, ruminating thoughts, irritability, difficulty sleeping, tension, and suicidal thoughts. Pt was oriented times 5. Pt was cooperative and engaged. Pt denies SI/HI/AVH.  There are fewer stressors in the home due to reduced discord between his children and wife. He reports that he has attempted to walk away from conflicts and remove himself from stressful situations. He has also indicated that he is able to engage in self-care when feeling stressed. Additionally, he mentions that he has not been overwhelmed with worries about the future due to his medications. He has addressed ways in which he is mindful of his communication. The clinician praised the patient's progress.  He reports that the medication he is taking helps with his worries and prevents him from feeling excessively angry.  He and his wife are working towards reinforcing a team dynamic with open and honest communication.  He is setting personal boundaries with work and has identified ways in which he is striving for a better work-life balance. He notes that he occasionally experiences negative thoughts about his performance, feeling like a failure. He is working on reframing these thoughts through [employer] (CBT). 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  Suicidal/Homicidal: Nowithout intent/plan  Therapist Response: During the session, the clinician employed active listening to create a safe environment for the  patient to process recent life events. The clinician assessed the patient's current symptoms, stressors, and safety since the last session. Cln worked with patient to identify controllable factors such as reframing negative perspectives or engaging in healthier lines of communication.   Plan: Return again in 2 weeks.  Diagnosis: PTSD (post-traumatic stress disorder)  Bipolar 2 disorder (HCC)  GAD (generalized anxiety disorder)  Panic 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   Collaboration of Care: AEB psychiatrist can access notes and cln. Will review psychiatrists' notes. Check in with the patient and will see LCSW per availability. Patient agreed with treatment recommendations.   Patient/Guardian was advised Release of Information must be obtained prior to any record release in order to collaborate their care with an outside provider. Patient/Guardian was advised if they have not already done so to contact the registration department to sign all necessary forms in order for us  to release information regarding their care.  Consent: Patient/Guardian gives verbal consent for treatment and assignment of benefits for services provided during this visit. Patient/Guardian expressed understanding and agreed to proceed.   Matthew [name] Husband, LCSW 06/11/2024

## 2024-06-21 DIAGNOSIS — G4733 Obstructive sleep apnea (adult) (pediatric): Secondary | ICD-10-CM | POA: Diagnosis not present

## 2024-06-22 ENCOUNTER — Encounter: Payer: Self-pay | Admitting: Licensed Clinical Social Worker

## 2024-06-22 ENCOUNTER — Ambulatory Visit (INDEPENDENT_AMBULATORY_CARE_PROVIDER_SITE_OTHER): Admitting: Licensed Clinical Social Worker

## 2024-06-22 ENCOUNTER — Ambulatory Visit: Payer: Self-pay

## 2024-06-22 DIAGNOSIS — Z91199 Patient's noncompliance with other medical treatment and regimen due to unspecified reason: Secondary | ICD-10-CM

## 2024-06-22 NOTE — Progress Notes (Signed)
 Clinician attempted session via face-to-face, but Matthew [name] did not appear for his session. Cln sent the patient a text message during the appointment with the link for the visit as an additional reminder. Cln waited 15 minutes after appointment for start but patient never appeared.  Per [employer], s/he will be charged a no-show fee.

## 2024-06-24 NOTE — Addendum Note (Signed)
 Addended by: LANELL SMALL B on: 06/24/2024 10:57 AM   Modules accepted: Level of Service

## 2024-06-25 ENCOUNTER — Ambulatory Visit (INDEPENDENT_AMBULATORY_CARE_PROVIDER_SITE_OTHER): Admitting: Licensed Clinical Social Worker

## 2024-06-25 DIAGNOSIS — F410 Panic disorder [episodic paroxysmal anxiety] without agoraphobia: Secondary | ICD-10-CM

## 2024-06-25 DIAGNOSIS — F4310 Post-traumatic stress disorder, unspecified: Secondary | ICD-10-CM | POA: Diagnosis not present

## 2024-06-25 DIAGNOSIS — F411 Generalized anxiety disorder: Secondary | ICD-10-CM | POA: Diagnosis not present

## 2024-06-25 DIAGNOSIS — F3181 Bipolar II disorder: Secondary | ICD-10-CM | POA: Diagnosis not present

## 2024-06-25 NOTE — Progress Notes (Signed)
 THERAPIST PROGRESS NOTE  Virtual Visit via Video Note  I connected with Matthew [name] on 06/25/24 at 10:00 AM EST by a video enabled telemedicine application and verified that I am speaking with the correct person using two identifiers.  Location: Patient: Matthew [name] Address  Provider: Providers Address   I discussed the limitations of evaluation and management by telemedicine and the availability of in person appointments. The patient expressed understanding and agreed to proceed.   I discussed the assessment and treatment plan with the patient. The patient was provided an opportunity to ask questions and all were answered. The patient agreed with the plan and demonstrated an understanding of the instructions.   The patient was advised to call back or seek an in-person evaluation if the symptoms worsen or if the condition fails to improve as anticipated.  I provided 55 minutes of non-face-to-face time during this encounter.   Evalene KATHEE Husband, LCSW   Session Time: 10-10:55am  Participation Level: Active  Behavioral Response:Casual Alert Euthymic   Type of Therapy: Individual Therapy  Treatment Goals addressed:    ProgressTowards Goals: Progressing  Interventions: Assertiveness training, solution focused  Summary: Matthew [name] is a 53 y.o. male who presents with low mood, lack of motivation, difficulty concentrating, restlessness, uncontrollable worry, anxious feelings, ruminating thoughts, irritability, difficulty sleeping, tension, and suicidal thoughts. Pt was oriented times 5. Pt was cooperative and engaged. Pt denies SI/HI/AVH.   In recent weeks, reports indicate that he has been exhibiting a notable sense of calmness. The atmosphere at home has settled, as a result of improved communication. As a result, he has developed a healthier dynamic with his daughter, fostering a more positive relationship.  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 However, he often finds himself apologizing to defuse conflicts, a habit that ultimately undermines his own well-being.  During our discussions, we explored his discomfort with conflict. He expressed feelings of exhaustion from being held responsible for the failures of others, which triggers deep-seated emotions tied to his negative core belief: I'm a failure.  As part of this journey, he is also learning to establish personal boundaries to protect his emotional health..   Moving forward, the patient will begin to explore dynamics with his parents.   Suicidal/Homicidal: Nowithout intent/plan  Therapist Response: During the session, the clinician employed active listening to create a safe environment for the patient to process recent life events. The clinician assessed the patient's current symptoms, stressors, and safety since the last session. Reflected on improvements with MH and communication. Identified personal boundaries. Explored solutions to the patient refraining from over apologizing.   Plan: Return again in 2 weeks.  Diagnosis: PTSD (post-traumatic stress disorder)  Bipolar 2 disorder (HCC)  GAD (generalized anxiety disorder)  Panic 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   Collaboration of Care: AEB psychiatrist can access notes and cln. Will review psychiatrists' notes. Check in with the patient and will see LCSW per availability. Patient agreed with treatment recommendations.   Patient/Guardian was advised Release of Information must be obtained prior to any record release in order to collaborate their care with an outside provider. Patient/Guardian was advised if they have not already done so to contact the registration department to sign all necessary forms in order for us  to release information regarding their care.   Consent: Patient/Guardian gives verbal consent for treatment and assignment of benefits for services provided  during this visit. Patient/Guardian expressed understanding and agreed to  proceed.   Evalene KATHEE Husband, LCSW 06/25/2024

## 2024-06-30 ENCOUNTER — Ambulatory Visit: Admitting: Sleep Medicine

## 2024-07-01 ENCOUNTER — Ambulatory Visit: Admitting: Sleep Medicine

## 2024-07-01 ENCOUNTER — Encounter: Payer: Self-pay | Admitting: Sleep Medicine

## 2024-07-01 VITALS — BP 120/72 | HR 98 | Temp 98.0°F | Ht 77.0 in | Wt 245.6 lb

## 2024-07-01 DIAGNOSIS — G4733 Obstructive sleep apnea (adult) (pediatric): Secondary | ICD-10-CM | POA: Diagnosis not present

## 2024-07-01 DIAGNOSIS — I10 Essential (primary) hypertension: Secondary | ICD-10-CM

## 2024-07-01 NOTE — Progress Notes (Signed)
 Name:Matthew [name] MRN: [id number] DOB: [birth year]   CHIEF COMPLAINT:  HST F/U   HISTORY OF PRESENT ILLNESS: Matthew [name] is a 53 y.o. w/ a h/o HTN, CAD and morbid obesity who presents to follow up on HST results. The patient underwent HST which revealed mild OSA (AHI 12, O2 nadir 88%).    EPWORTH SLEEP SCORE 19    04/26/2024    8:00 AM  Results of the Epworth flowsheet  Sitting and reading 3  Watching TV 3  Sitting, inactive in a public place (e.g. a theatre or a meeting) 3  As a passenger in a car for an hour without a break 1  Lying down to rest in the afternoon when circumstances permit 3  Sitting and talking to someone 2  Sitting quietly after a lunch without alcohol 3  In a car, while stopped for a few minutes in traffic 1  Total score 19    PAST MEDICAL HISTORY :   has a past medical history of Acute kidney failure (01/2012), Anxiety, Bipolar disorder (HCC), COPD (chronic obstructive pulmonary disease) (HCC), Depression, Hyperlipidemia, Hypertension, and Low back pain.  has a past surgical history that includes No past surgeries; Coronary/Graft Acute MI Revascularization (N/A, 10/15/2023); and LEFT HEART CATH AND CORONARY ANGIOGRAPHY (N/A, 10/15/2023). Prior to Admission medications   Medication Sig Start Date End Date Taking? Authorizing Provider  aspirin  81 MG chewable tablet Chew 1 tablet (81 mg total) by mouth daily. 11/10/23  Yes Pender, Julie F, FNP  atorvastatin  (LIPITOR ) 80 MG tablet Take 1 tablet (80 mg total) by mouth daily. 11/10/23  Yes Pender, Julie F, FNP  busPIRone  (BUSPAR ) 15 MG tablet Take 0.5 tablets (7.5 mg total) by mouth daily AND 1 tablet (15 mg total) every evening. Patient taking differently: 7.5 mg twice a day and 15 mg at night 02/08/24 05/08/24 Yes Hisada, Katheren, MD  lisinopril  (ZESTRIL ) 2.5 MG tablet Take 1 tablet (2.5 mg total) by mouth at bedtime. 11/10/23  Yes Pender, Julie F, FNP  LORazepam  (ATIVAN ) 1 MG tablet May take 1 tablet  (1 mg total) by mouth 2 (two) times daily as needed for anxiety. May also take 1 tablet (1 mg total) every other day as needed for anxiety. 03/29/24 05/28/24 Yes Vickey Katheren, MD  LORazepam  (ATIVAN ) 1 MG tablet Take 1 tablet by mouth two times a day as needed for anxiety, may also take 1 tablet by mouth every other day as needed for anxiety 03/17/24  Yes Hisada, Katheren, MD  metoprolol  succinate (TOPROL -XL) 25 MG 24 hr tablet Take 0.5 tablets (12.5 mg total) by mouth daily. 11/10/23  Yes Gareth Mliss FALCON, FNP  prasugrel  (EFFIENT ) 10 MG TABS tablet Take 1 tablet (10 mg total) by mouth daily. 11/10/23  Yes Gareth Mliss FALCON, FNP  cariprazine  (VRAYLAR ) 1.5 MG capsule Take 1 capsule (1.5 mg total) by mouth daily. Patient not taking: Reported on 04/26/2024 04/06/24 06/05/24  Vickey Katheren, MD   Allergies  Allergen Reactions   Ciprofloxacin     Acute kidney failure    FAMILY HISTORY:  family history includes Alcohol abuse in his brother and father; Anxiety disorder in his mother; Bipolar disorder in his paternal aunt and sister; Cancer in his maternal grandmother and paternal grandmother; Depression in his maternal grandmother; Drug abuse in his brother and father; Hypertension in his maternal grandfather and paternal grandfather; Schizophrenia in his paternal aunt. SOCIAL HISTORY:  reports that he has been smoking cigarettes. He has  a 40 pack-year smoking history. He has never used smokeless tobacco. He reports that he does not currently use alcohol. He reports current drug use. Drug: Marijuana.   Review of Systems:  Gen:  Denies  fever, sweats, chills weight loss  HEENT: Denies blurred vision, double vision, ear pain, eye pain, hearing loss, nose bleeds, sore throat Cardiac:  No dizziness, chest pain or heaviness, chest tightness,edema, No JVD Resp:   No cough, -sputum production, -shortness of breath,-wheezing, -hemoptysis,  Gi: Denies swallowing difficulty, stomach pain, nausea or vomiting, diarrhea,  constipation, bowel incontinence Gu:  Denies bladder incontinence, burning urine Ext:   Denies Joint pain, stiffness or swelling Skin: Denies  skin rash, easy bruising or bleeding or hives Endoc:  Denies polyuria, polydipsia , polyphagia or weight change Psych:   Denies depression, insomnia or hallucinations  Other:  All other systems negative  VITAL SIGNS: BP 120/72   Pulse 98   Temp 98 F (36.7 C)   Ht 6' 5 (1.956 m)   Wt 245 lb 9.6 oz (111.4 kg)   SpO2 96%   BMI 29.12 kg/m     Physical Examination:   General Appearance: No distress  EYES PERRLA, EOM intact.   NECK Supple, No JVD Pulmonary: normal breath sounds, No wheezing.  CardiovascularNormal S1,S2.  No m/r/g.   Abdomen: Benign, Soft, non-tender. Skin:   warm, no rashes, no ecchymosis  Extremities: normal, no cyanosis, clubbing. Neuro:without focal findings,  speech normal  PSYCHIATRIC: Mood, affect within normal limits.   ASSESSMENT AND PLAN  OSA Reviewed HST results with patient. Starting on APAP therapy set to 4-16 cm H2O. Discussed the consequences of untreated sleep apnea. Advised not to drive drowsy for safety of patient and others. Will complete further evaluation with a home sleep study and follow up to review results.    HTN Stable, on current management. Following with PCP.    Patient  satisfied with Plan of action and management. All questions answered  I spent a total of 25 minutes reviewing chart data, face-to-face evaluation with the patient, counseling and coordination of care as detailed above.    Matthew [name], M.D.  Sleep Medicine [facility]

## 2024-07-01 NOTE — Patient Instructions (Addendum)

## 2024-07-02 ENCOUNTER — Other Ambulatory Visit (HOSPITAL_COMMUNITY): Payer: Self-pay

## 2024-07-05 ENCOUNTER — Telehealth: Payer: Self-pay

## 2024-07-05 ENCOUNTER — Other Ambulatory Visit: Payer: Self-pay | Admitting: Psychiatry

## 2024-07-05 ENCOUNTER — Other Ambulatory Visit (HOSPITAL_COMMUNITY): Payer: Self-pay

## 2024-07-05 NOTE — Telephone Encounter (Signed)
 Please contact the pharmacy.  The prescription was a 30-day supply with one refill, for a total of 60 days. The patient is correct that the dosage requires 75 tablets for each 30-day period. Could you ask to process the refill today so the medication is available? Thanks.

## 2024-07-05 NOTE — Telephone Encounter (Signed)
 Medication refill - Call with patient, after speaking to Daphnie, pharmacist at [facility] to verify patient last filled his Lorazepam  order from 06/03/24 on 06/03/24. Patient reported this should have been a 30 day order, #75 for him to take one twice a day and and extra pill as needed every other day but that the pharmacy would not allow him to refill the medication because Dr. Hisada stated on the order this was for 60 days.  Agreed with patient to send back to Dr. Hisada for her to authorize him to fill and would inform his pharmacy if she approved.  Patient agreed with plan.

## 2024-07-05 NOTE — Telephone Encounter (Signed)
 Medication management - Call back with patient, after speaking to [place], pharmacist at [facility] to inform them that Dr. Giovanni order for Lorazepam  sent on 06/03/24 was for a total of 60 days so they could refill the medication as ordered, Lorazepam  1 mg, one tablet twice a day and 1 additional tablet every other day for a total of #75 every 30 days.  Informed patient the pharmacy was filling this now for patient and to call our office back if any other issues.

## 2024-07-06 ENCOUNTER — Ambulatory Visit: Admitting: Licensed Clinical Social Worker

## 2024-07-06 DIAGNOSIS — F410 Panic disorder [episodic paroxysmal anxiety] without agoraphobia: Secondary | ICD-10-CM

## 2024-07-06 DIAGNOSIS — F3181 Bipolar II disorder: Secondary | ICD-10-CM

## 2024-07-06 DIAGNOSIS — F4310 Post-traumatic stress disorder, unspecified: Secondary | ICD-10-CM

## 2024-07-06 DIAGNOSIS — F411 Generalized anxiety disorder: Secondary | ICD-10-CM
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
## 2024-07-13 ENCOUNTER — Ambulatory Visit (INDEPENDENT_AMBULATORY_CARE_PROVIDER_SITE_OTHER): Admitting: Licensed Clinical Social Worker

## 2024-07-13 DIAGNOSIS — F4310 Post-traumatic stress disorder, unspecified: Secondary | ICD-10-CM | POA: Diagnosis not present

## 2024-07-13 DIAGNOSIS — F411 Generalized anxiety disorder: Secondary | ICD-10-CM | POA: Diagnosis not present

## 2024-07-13 DIAGNOSIS — F410 Panic disorder [episodic paroxysmal anxiety] without agoraphobia: Secondary | ICD-10-CM | POA: Diagnosis not present

## 2024-07-13 DIAGNOSIS — F3181 Bipolar II disorder: Secondary | ICD-10-CM
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
## 2024-07-20 ENCOUNTER — Other Ambulatory Visit (HOSPITAL_COMMUNITY): Payer: Self-pay

## 2024-07-20 ENCOUNTER — Telehealth: Payer: Self-pay | Admitting: Psychiatry

## 2024-07-20 ENCOUNTER — Other Ambulatory Visit: Payer: Self-pay

## 2024-07-20 ENCOUNTER — Encounter: Payer: Self-pay | Admitting: Psychiatry

## 2024-07-20 ENCOUNTER — Ambulatory Visit (INDEPENDENT_AMBULATORY_CARE_PROVIDER_SITE_OTHER): Admitting: Psychiatry

## 2024-07-20 VITALS — BP 135/95 | HR 93 | Temp 97.3°F | Ht 77.0 in | Wt 243.4 lb

## 2024-07-20 DIAGNOSIS — F3181 Bipolar II disorder: Secondary | ICD-10-CM | POA: Diagnosis not present

## 2024-07-20 DIAGNOSIS — F410 Panic disorder [episodic paroxysmal anxiety] without agoraphobia: Secondary | ICD-10-CM

## 2024-07-20 DIAGNOSIS — F411 Generalized anxiety disorder: Secondary | ICD-10-CM

## 2024-07-20 DIAGNOSIS — F4310 Post-traumatic stress disorder, unspecified: Secondary | ICD-10-CM | POA: Diagnosis not present

## 2024-07-20 MED ORDER — BUSPIRONE HCL 15 MG PO TABS
ORAL_TABLET | ORAL | 0 refills | Status: AC
  Filled 2024-07-26: qty 135, 90d supply, fill #0

## 2024-07-20 MED ORDER — VILAZODONE HCL 10 MG PO TABS
ORAL_TABLET | ORAL | 1 refills | Status: DC
  Filled 2024-07-20: qty 30, 33d supply, fill #0
  Filled 2024-09-03: qty 30, 30d supply, fill #1

## 2024-07-20 MED ORDER — LORAZEPAM 1 MG PO TABS
ORAL_TABLET | ORAL | 1 refills | Status: DC
  Filled 2024-07-20: qty 75, fill #0
  Filled 2024-08-05: qty 75, 30d supply, fill #0
  Filled 2024-09-03: qty 75, 30d supply, fill #1

## 2024-07-20 MED ORDER — RISPERIDONE 1 MG PO TABS
1.0000 mg | ORAL_TABLET | Freq: Every day | ORAL | 0 refills | Status: AC
  Filled 2024-07-26: qty 90, 90d supply, fill #0

## 2024-07-20 NOTE — Telephone Encounter (Signed)
 Could you make a referral to ketamine treatment to [facility] in [place]? Thanks.

## 2024-07-20 NOTE — Patient Instructions (Signed)
 Start Viibryd 5 mg daily for one week, then 10 mg daily  Continue buspirone  7.5 mg twice a day, 15 mg at night Continue risperidone  1 mg at night  Continue lorazepam  1 mg twice a day as needed for anxiety, 75 tablets per month Next appointment: 1/27 at 8 AM, IP Referral for ketamine treatment

## 2024-07-21 ENCOUNTER — Other Ambulatory Visit (HOSPITAL_COMMUNITY): Payer: Self-pay

## 2024-07-21 NOTE — Telephone Encounter (Signed)
 Referral sent

## 2024-07-22 ENCOUNTER — Ambulatory Visit (INDEPENDENT_AMBULATORY_CARE_PROVIDER_SITE_OTHER): Admitting: Licensed Clinical Social Worker

## 2024-07-22 ENCOUNTER — Encounter: Payer: Self-pay | Admitting: Licensed Clinical Social Worker

## 2024-07-22 DIAGNOSIS — F4310 Post-traumatic stress disorder, unspecified: Secondary | ICD-10-CM | POA: Diagnosis not present

## 2024-07-22 DIAGNOSIS — F3181 Bipolar II disorder: Secondary | ICD-10-CM | POA: Diagnosis not present

## 2024-07-22 DIAGNOSIS — F410 Panic disorder [episodic paroxysmal anxiety] without agoraphobia: Secondary | ICD-10-CM

## 2024-07-22 DIAGNOSIS — F411 Generalized anxiety disorder: Secondary | ICD-10-CM | POA: Diagnosis not present

## 2024-07-22 NOTE — Progress Notes (Signed)
 THERAPIST PROGRESS NOTE  Session Time: 11:08-12:01pm  Participation Level: Active  Behavioral Response: CasualAlertNegative, Hopeless, and Tearful  Type of Therapy: Individual Therapy  Treatment Goals addressed:  Active     Anxiety     LTG: Matthew [name] will score less than 5 on the Generalized Anxiety Disorder 7 Scale (GAD-7)  (Not Progressing)     Start:  09/17/23    Expected End:  08/27/24       Goal Note     05/27/24: Patient reports his anxiety has been higher as a result of work and home life stressors.          STG: Report a 50% reduction in the frequency of rehearsing thoughts related to social interactions. (Progressing)     Start:  09/17/23    Expected End:  08/27/24         Perform psychoeducation regarding anxiety disorders     Start:  09/17/23         Work with patient individually to identify the major components of a recent episode of anxiety: physical symptoms, major thoughts and images, and major behaviors they experienced     Start:  09/17/23         Coping Skills     Start:  09/17/23       Will work with the pt using CBT/DBT techniques to help the pt verbalize an understanding of the cognitive, physiological, and behavioral components of anxiety and its treatment. This will be done by using worksheets, interactive activities, CBT/ABC thought logs, modeling, homework, role playing and journaling. Will work with pt to learn and implement coping skills that result in a reduction of anxiety and improve daily functioning per pt self report 3 out of 5 documented sessions.       Participating in weekly Cognitive-Behavioral Therapy (CBT) sessions to focus on cognitive restructuring and address irrational fears.       Start:  09/17/23            Identify and challenge negative thoughts: Recognize distorted or unhelpful thinking patterns and substitute them with more balanced and constructive alternatives.       Start:  09/17/23               Social Interpersonal Effectiveness     LTG: Matthew [name] will recognize socially inappropriate behaviors and develop alternative behaviors (Not Progressing)     Start:  09/17/23    Expected End:  08/27/24         STG: Matthew [name] will demonstrate interest in social activities by initiating/joining social activity without prompts (Not Progressing)     Start:  09/17/23    Expected End:  08/27/24         STG: Matthew [name] will identify 2 behaviors that engage in social isolation  (Progressing)     Start:  09/17/23    Expected End:  08/27/24       Goal Note     05/27/24: Patient demonstrated progress in his ability to acknowledge changes in his communication and triggering situation which would lead to isolation.          Educate Matthew [name] on appropriate behaviors and boundaries     Start:  09/17/23         Facilitate discussions with Matthew [name] regarding miscommunications that occur during social interactions     Start:  09/17/23         Educate Matthew [name] on aggressive, passive,and assertive communication styles     Start:  09/17/23  ProgressTowards Goals: Progressing  Interventions: Assertiveness Training and Supportive  Summary:  Matthew [name] is a 53 y.o. male who presents with low mood, lack of motivation, difficulty concentrating, restlessness, uncontrollable worry, anxious feelings, ruminating thoughts, irritability, difficulty sleeping, tension, and suicidal thoughts. Pt was oriented times 5. Pt was cooperative and engaged. Pt denies SI/HI/AVH.   The clinician was accompanied by a mental health outpatient therapist and training by the name of Curtiss Carrie.  The patient attended the session and reported improvements in managing anxiety related to returning to work following the holiday season.   During the session, the patient became tearful while reflecting on recent conversations with his mother and father that were influenced by the holiday season. He expressed  feeling out of place and identified feelings of misplaced guilt, believing he has an obligation to maintain communication with his parents, despite feeling that it provides no emotional benefit.  The clinician and the patient explored his progress through the stages of grief, focusing on the patient's reflections about his experiences with anger versus sadness. The patient reported improvements in his belief of being "a failure" and discussed ways he has challenged himself to strengthen his parenting based on his own childhood experiences. The clinician and patient examined the patient's choices regarding how he wants to engage in certain relationships moving forward. The patient considered the potential benefits of practicing assertive communication with his parents concerning everyone's expectations for their relationship.  The patient referenced his inner child and expressed a fear of abandonment, identifying his difficulty in being vulnerable with others.  The clinician assessed the patient's readiness to begin Eye Movement Desensitization and Reprocessing (EMDR), and the patient consented to this approach. For the next session, the patient will create a visualization of his peaceful place and a container for his feelings.  Additionally, the clinician will send grounding techniques to the patient for him to review through MyChart.  Suicidal/Homicidal: Nowithout intent/plan  Therapist Response: During the session, the clinician employed active listening to create a safe environment for the patient to process recent life events. The clinician assessed the patient's current symptoms, stressors, and safety since the last session.  Patient continued to explore the impact of his childhood trauma on current dynamics within his adult life specifically related to his relationship with his parents today and his role of his father within his children's lives.  Identified the impact of patient's current  relationships providing psychoeducation on trauma response/attachment styles.  Explored controllable factors for the patient specifically related to use of assertive communication moving forward.  Assessed for patient's readiness to begin EMDR treatment.  Plan: Return again in 2 weeks.  Diagnosis: PTSD (post-traumatic stress disorder)  Bipolar 2 disorder (HCC)  GAD (generalized anxiety disorder)  Panic 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   Collaboration of Care:  [facility] psychiatrist can access notes and cln. Will review psychiatrists' notes. Check in with the patient and will see LCSW per availability. Patient agreed with treatment recommendations.   Patient/Guardian was advised Release of Information must be obtained prior to any record release in order to collaborate their care with an outside provider. Patient/Guardian was advised if they have not already done so to contact the registration department to sign all necessary forms in order for us  to release information regarding their care.   Consent: Patient/Guardian gives verbal consent for treatment and assignment of benefits for services provided during this visit. Patient/Guardian expressed understanding and agreed to proceed.   Evalene KATHEE Husband, LCSW 07/22/2024

## 2024-07-26 ENCOUNTER — Other Ambulatory Visit: Payer: Self-pay

## 2024-07-26 ENCOUNTER — Other Ambulatory Visit (HOSPITAL_COMMUNITY): Payer: Self-pay

## 2024-07-31 ENCOUNTER — Other Ambulatory Visit (HOSPITAL_COMMUNITY): Payer: Self-pay

## 2024-08-03 ENCOUNTER — Ambulatory Visit (INDEPENDENT_AMBULATORY_CARE_PROVIDER_SITE_OTHER): Admitting: Licensed Clinical Social Worker

## 2024-08-03 DIAGNOSIS — F3181 Bipolar II disorder: Secondary | ICD-10-CM

## 2024-08-03 DIAGNOSIS — F411 Generalized anxiety disorder: Secondary | ICD-10-CM | POA: Diagnosis not present

## 2024-08-03 DIAGNOSIS — F4310 Post-traumatic stress disorder, unspecified: Secondary | ICD-10-CM

## 2024-08-03 DIAGNOSIS — F410 Panic disorder [episodic paroxysmal anxiety] without agoraphobia: Secondary | ICD-10-CM | POA: Diagnosis not present

## 2024-08-03 NOTE — Progress Notes (Signed)
 THERAPIST PROGRESS NOTE  Session Time: 10:07-10:54am  Participation Level: Active  Behavioral Response: CasualAlertAnxious  Type of Therapy: Individual Therapy  Treatment Goals addressed:  Active       Anxiety       LTG: Channon will score less than 5 on the Generalized Anxiety Disorder 7 Scale (GAD-7)  (Not Progressing)       Start:  09/17/23    Expected End:  08/27/24         Goal Note       05/27/24: Patient reports his anxiety has been higher as a result of work and home life stressors.             STG: Report a 50% reduction in the frequency of rehearsing thoughts related to social interactions. (Progressing)       Start:  09/17/23    Expected End:  08/27/24           Perform psychoeducation regarding anxiety disorders       Start:  09/17/23           Work with patient individually to identify the major components of a recent episode of anxiety: physical symptoms, major thoughts and images, and major behaviors they experienced       Start:  09/17/23           Coping Skills       Start:  09/17/23       Will work with the pt using CBT/DBT techniques to help the pt verbalize an understanding of the cognitive, physiological, and behavioral components of anxiety and its treatment. This will be done by using worksheets, interactive activities, CBT/ABC thought logs, modeling, homework, role playing and journaling. Will work with pt to learn and implement coping skills that result in a reduction of anxiety and improve daily functioning per pt self report 3 out of 5 documented sessions.          Participating in weekly Cognitive-Behavioral Therapy (CBT) sessions to focus on cognitive restructuring and address irrational fears.         Start:  09/17/23                Identify and challenge negative thoughts: Recognize distorted or unhelpful thinking patterns and substitute them with more balanced and constructive alternatives.         Start:  09/17/23                   Social Interpersonal Effectiveness       LTG: Yazeed will recognize socially inappropriate behaviors and develop alternative behaviors (Not Progressing)       Start:  09/17/23    Expected End:  08/27/24           STG: [name] will demonstrate interest in social activities by initiating/joining social activity without prompts (Not Progressing)       Start:  09/17/23    Expected End:  08/27/24           STG: [name] will identify 2 behaviors that engage in social isolation  (Progressing)       Start:  09/17/23    Expected End:  08/27/24         Goal Note       05/27/24: Patient demonstrated progress in his ability to acknowledge changes in his communication and triggering situation which would lead to isolation.             Educate Hutchinson on appropriate behaviors and boundaries  Start:  09/17/23           Facilitate discussions with [name] regarding miscommunications that occur during social interactions       Start:  09/17/23           Educate Starlin on aggressive, passive,and assertive communication styles       Start:  09/17/23               ProgressTowards Goals: Progressing   Interventions: CBT, reframing, supportive   Summary:  JAYVON MOUNGER III is a 53 y.o. male who presents with low mood, lack of motivation, difficulty concentrating, restlessness, uncontrollable worry, anxious feelings, ruminating thoughts, irritability, difficulty sleeping, tension, and suicidal thoughts. Pt was oriented times 5. Pt was cooperative and engaged. Pt denies SI/HI/AVH.   The patient attended the session expressing uncertainty arising from uncontrollable worry and patterns of overthinking, particularly related to dynamics within the home. He identified that his extended work hours were impacting his relationships, contributing to concerns about financial security. Together, we explored controllable factors and discussed how the patient could use assertive  communication to address his insecurities with his spouse, rather than falling into rumination.  The clinician and patient also reflected on his progress and the importance of accepting his past by practicing self-compassion and understanding how to extend grace to his parents.  The clinician encouraged the patient to engage in an activity where he identified all of his worst fears. We utilized cognitive-behavioral therapy (CBT) exercises aimed at challenging the validity of these negative belief systems.   For homework, the patient was tasked with creating a T-chart. This chart will include his emotional perspective along with ways to challenge those feelings through a logical lens, identifying the likelihood of the worst-case scenario, possible action steps, and reflecting on controllable factors.   Suicidal/Homicidal: Nowithout intent/plan  Therapist Response: During the session, the clinician employed active listening to create a safe environment for the patient to process recent life events. The clinician assessed the patients current symptoms, stressors, and safety since the last session.  Clinician utilized CBT to work with patient on challenging patterns of ruminating on negative belief systems.   Plan: Return again in 2 weeks.   Diagnosis: PTSD (post-traumatic stress disorder)   Bipolar 2 disorder (HCC)   GAD (generalized anxiety disorder)   Panic 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  Collaboration of Care: [facility] psychiatrist can access notes and cln. Will review psychiatrists' notes. Check in with the patient and will see LCSW per availability. Patient agreed with treatment recommendations.   Patient/Guardian was advised Release of Information must be obtained prior to any record release in order to collaborate their care with an outside provider. Patient/Guardian was advised if they have not already done so to contact the registration department to sign all necessary forms in order for us   to release information regarding their care.   Consent: Patient/Guardian gives verbal consent for treatment and assignment of benefits for services provided during this visit. Patient/Guardian expressed understanding and agreed to proceed.   Evalene KATHEE Husband, LCSW 08/03/2024

## 2024-08-04 ENCOUNTER — Other Ambulatory Visit: Payer: Self-pay | Admitting: Psychiatry

## 2024-08-05 ENCOUNTER — Other Ambulatory Visit (HOSPITAL_COMMUNITY): Payer: Self-pay

## 2024-08-05 ENCOUNTER — Ambulatory Visit: Admitting: Licensed Clinical Social Worker

## 2024-08-16 NOTE — Progress Notes (Unsigned)
 "  THERAPIST PROGRESS NOTE  Session Time: 8:05-9:02am  Participation Level: Active  Behavioral Response: CasualAlertNegative and Worthless  Type of Therapy: Individual Therapy  Treatment Goals addressed:  Active     Anxiety     LTG: Emrik will score less than 5 on the Generalized Anxiety Disorder 7 Scale (GAD-7)  (Not Progressing)     Start:  09/17/23    Expected End:  08/27/24       Goal Note     05/27/24: Patient reports his anxiety has been higher as a result of work and home life stressors.          STG: Report a 50% reduction in the frequency of rehearsing thoughts related to social interactions. (Progressing)     Start:  09/17/23    Expected End:  08/27/24         Perform psychoeducation regarding anxiety disorders     Start:  09/17/23         Work with patient individually to identify the major components of a recent episode of anxiety: physical symptoms, major thoughts and images, and major behaviors they experienced     Start:  09/17/23         Coping Skills     Start:  09/17/23       Will work with the pt using CBT/DBT techniques to help the pt verbalize an understanding of the cognitive, physiological, and behavioral components of anxiety and its treatment. This will be done by using worksheets, interactive activities, CBT/ABC thought logs, modeling, homework, role playing and journaling. Will work with pt to learn and implement coping skills that result in a reduction of anxiety and improve daily functioning per pt self report 3 out of 5 documented sessions.       Participating in weekly Cognitive-Behavioral Therapy (CBT) sessions to focus on cognitive restructuring and address irrational fears.       Start:  09/17/23            Identify and challenge negative thoughts: Recognize distorted or unhelpful thinking patterns and substitute them with more balanced and constructive alternatives.       Start:  09/17/23              Social  Interpersonal Effectiveness     LTG: Tyreek will recognize socially inappropriate behaviors and develop alternative behaviors (Not Progressing)     Start:  09/17/23    Expected End:  08/27/24         STG: [name] will demonstrate interest in social activities by initiating/joining social activity without prompts (Not Progressing)     Start:  09/17/23    Expected End:  08/27/24         STG: [name] will identify 2 behaviors that engage in social isolation  (Progressing)     Start:  09/17/23    Expected End:  08/27/24       Goal Note     05/27/24: Patient demonstrated progress in his ability to acknowledge changes in his communication and triggering situation which would lead to isolation.          Educate [name] on appropriate behaviors and boundaries     Start:  09/17/23         Facilitate discussions with Renn regarding miscommunications that occur during social interactions     Start:  09/17/23         Educate Qu[place]avious on aggressive, passive,and assertive communication styles     Start:  09/17/23  ProgressTowards Goals: Progressing  Interventions: Supportive, Reframing, and Other: EMDR  Summary: EUGINE BUBB III is a 53 y.o. male who presents with low mood, lack of motivation, difficulty concentrating, restlessness, uncontrollable worry, anxious feelings, ruminating thoughts, irritability, difficulty sleeping, tension, and suicidal thoughts. Pt was oriented times 5. Pt was cooperative and engaged. Pt denies SI/HI/AVH.   Initially worked with patient to construct his target sequence plan to begin EMDR trauma treatment.  Patient reflected on a series of memories that instilled the belief he is worthless and a failure. Patient specifically referenced memories of rejection.  Worked with patient to explore expectations for trauma treatment understanding intentions to work towards establishing a great area as the patient often engages in black-and-white  thinking traps.  Suicidal/Homicidal: Nowithout intent/plan  TTherapist Response: During the session, the clinician employed active listening to create a safe environment for the patient to process recent life events. The clinician assessed the patients current symptoms, stressors, and safety since the last session.  Clinician assisted patient in understanding his root memory tied to this negative belief system and also challenged patient to recognize ways in which he engaged in mind-reading strategies basing his negative belief system in some instances off of using feelings as facts.   Plan: Return again in 2 weeks.   Diagnosis: PTSD (post-traumatic stress disorder)   Bipolar 2 disorder (HCC)   GAD (generalized anxiety disorder)   Panic 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  Collaboration of Care:  [facility] psychiatrist can access notes and cln. Will review psychiatrists' notes. Check in with the patient and will see LCSW per availability. Patient agreed with treatment recommendations.   Patient/Guardian was advised Release of Information must be obtained prior to any record release in order to collaborate their care with an outside provider. Patient/Guardian was advised if they have not already done so to contact the registration department to sign all necessary forms in order for us  to release information regarding their care.   Consent: Patient/Guardian gives verbal consent for treatment and assignment of benefits for services provided during this visit. Patient/Guardian expressed understanding and agreed to proceed.   Evalene KATHEE Husband, LCSW 08/16/2024  "

## 2024-08-17 ENCOUNTER — Ambulatory Visit: Admitting: Licensed Clinical Social Worker

## 2024-08-17 DIAGNOSIS — F411 Generalized anxiety disorder: Secondary | ICD-10-CM | POA: Diagnosis not present

## 2024-08-17 DIAGNOSIS — F3181 Bipolar II disorder: Secondary | ICD-10-CM

## 2024-08-17 DIAGNOSIS — F410 Panic disorder [episodic paroxysmal anxiety] without agoraphobia: Secondary | ICD-10-CM | POA: Diagnosis not present

## 2024-08-17 DIAGNOSIS — F4310 Post-traumatic stress disorder, unspecified: Secondary | ICD-10-CM

## 2024-08-18 ENCOUNTER — Other Ambulatory Visit (HOSPITAL_COMMUNITY): Payer: Self-pay

## 2024-09-02 ENCOUNTER — Ambulatory Visit (INDEPENDENT_AMBULATORY_CARE_PROVIDER_SITE_OTHER): Admitting: Licensed Clinical Social Worker

## 2024-09-02 ENCOUNTER — Encounter: Payer: Self-pay | Admitting: Licensed Clinical Social Worker

## 2024-09-02 DIAGNOSIS — F410 Panic disorder [episodic paroxysmal anxiety] without agoraphobia: Secondary | ICD-10-CM | POA: Diagnosis not present

## 2024-09-02 DIAGNOSIS — F411 Generalized anxiety disorder: Secondary | ICD-10-CM

## 2024-09-02 DIAGNOSIS — F3181 Bipolar II disorder: Secondary | ICD-10-CM | POA: Diagnosis not present

## 2024-09-02 DIAGNOSIS — F4310 Post-traumatic stress disorder, unspecified: Secondary | ICD-10-CM | POA: Diagnosis not present

## 2024-09-02 NOTE — Progress Notes (Signed)
 "  THERAPIST PROGRESS NOTE  Session Time: 11-11:56am  Participation Level: Active  Behavioral Response: CasualAlertNegative and Anxious  Type of Therapy: Individual Therapy  Treatment Goals addressed:  Active     Anxiety     LTG: [name] will score less than 5 on the Generalized Anxiety Disorder 7 Scale (GAD-7)  (Not Progressing)     Start:  09/17/23    Expected End:  08/27/24       Goal Note     09/02/24: Patient became tearful identifying he is overwhelmed with negative beliefs that he is a failure often leading to increased anxiety.  However, patient notate improvement with panic attacks.         STG: Report a 50% reduction in the frequency of rehearsing thoughts related to social interactions. (Progressing)     Start:  09/17/23    Expected End:  08/27/24       Goal Note     09/02/24: Patient was demonstrate improvements with his ability to engage in alternative perspectives instead of only considering the worst case scenario.         Perform psychoeducation regarding anxiety disorders     Start:  09/17/23         Work with patient individually to identify the major components of a recent episode of anxiety: physical symptoms, major thoughts and images, and major behaviors they experienced     Start:  09/17/23         Coping Skills     Start:  09/17/23       Will work with the pt using CBT/DBT techniques to help the pt verbalize an understanding of the cognitive, physiological, and behavioral components of anxiety and its treatment. This will be done by using worksheets, interactive activities, CBT/ABC thought logs, modeling, homework, role playing and journaling. Will work with pt to learn and implement coping skills that result in a reduction of anxiety and improve daily functioning per pt self report 3 out of 5 documented sessions.       Participating in weekly Cognitive-Behavioral Therapy (CBT) sessions to focus on cognitive restructuring and address  irrational fears.       Start:  09/17/23            Identify and challenge negative thoughts: Recognize distorted or unhelpful thinking patterns and substitute them with more balanced and constructive alternatives.       Start:  09/17/23              Social Interpersonal Effectiveness     LTG: Matthew [name] will recognize socially inappropriate behaviors and develop alternative behaviors (Progressing)     Start:  09/17/23    Expected End:  08/27/24         STG: Matthew will demonstrate interest in social activities by initiating/joining social activity without prompts (Progressing)     Start:  09/17/23    Expected End:  08/27/24         STG: Matthew will identify 2 behaviors that engage in social isolation  (Not Progressing)     Start:  09/17/23    Expected End:  08/27/24       Goal Note     09/02/24: Patient continues to demonstrate difficulty amongst crowds citing a few instances where he has to remove himself from social situations due to panic attacks.  However, patient continues to demonstrate motivation with his ability to continue to participate in social situations.         Educate Matthew on  appropriate behaviors and boundaries     Start:  09/17/23         Facilitate discussions with Matthew regarding miscommunications that occur during social interactions     Start:  09/17/23         Educate Matthew [name] on aggressive, passive,and assertive communication styles     Start:  09/17/23            ProgressTowards Goals: Progressing   Interventions: Supportive, Reframing, and Other: EMDR, inner child work   Summary: Matthew [name] is a 54 y.o. male who presents with low mood, lack of motivation, difficulty concentrating, restlessness, uncontrollable worry, anxious feelings, ruminating thoughts, irritability, difficulty sleeping, tension, and suicidal thoughts. Pt was oriented times 5. Pt was cooperative and engaged. Pt denies SI/HI/AVH.   Patient utilized session  to begin EMDR processing a childhood memory related to feeling he has a failure and not good enough.  Patient reports objective units of distress decreased from a score of 8 to a score of 0.  Cln worked with the patient to instill the belief I can accept myself as I am.  Patient became tearful reflecting on anger, loneliness, and confusion as a result of his parents response throughout his childhood.  Patient was avoidant with eye contact.  Through inner child work, patient was able to demonstrate improvements in understanding his ability to go back and read parent his inner child in an effort to heal feelings of loneliness.  Clinician worked with patient to clear out future situations that may trigger the belief he is a failure and debriefed on solutions he can engage in to improve expectations as well as communication.  Patient became tearful stating he is tired of negative internal dialogue identifying hope for EMDR treatment.  For homework, patient was reminded to engage in CBT exercises to challenge negative belief systems outside of therapy.  Suicidal/Homicidal: Nowithout intent/plan   Therapist Response: During the session, the clinician employed active listening to create a safe environment for the patient to process recent life events. The clinician assessed the patients current symptoms, stressors, and safety since the last session.  Patient began EMDR reprocessing his touchstone memory on his target sequence plan.  Engage patient in and her child work to clear out feelings of loneliness.  Plan: Return again in 2 weeks.   Diagnosis: PTSD (post-traumatic stress disorder)   Bipolar 2 disorder (HCC)   GAD (generalized anxiety disorder)   Panic 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   Collaboration of Care:  [facility] psychiatrist can access notes and cln. Will review psychiatrists' notes. Check in with the patient and will see LCSW per availability. Patient agreed with treatment recommendations.    Patient/Guardian was advised Release of Information must be obtained prior to any record release in order to collaborate their care with an outside provider. Patient/Guardian was advised if they have not already done so to contact the registration department to sign all necessary forms in order for us  to release information regarding their care.   Consent: Patient/Guardian gives verbal consent for treatment and assignment of benefits for services provided during this visit. Patient/Guardian expressed understanding and agreed to proceed.   Evalene KATHEE Husband, LCSW 09/02/2024  "

## 2024-09-03 ENCOUNTER — Other Ambulatory Visit (HOSPITAL_COMMUNITY): Payer: Self-pay
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
## 2024-09-09 ENCOUNTER — Other Ambulatory Visit (HOSPITAL_COMMUNITY): Payer: Self-pay

## 2024-09-14 ENCOUNTER — Other Ambulatory Visit (HOSPITAL_COMMUNITY): Payer: Self-pay

## 2024-09-14 ENCOUNTER — Encounter: Payer: Self-pay | Admitting: Psychiatry

## 2024-09-14 ENCOUNTER — Telehealth: Admitting: Psychiatry

## 2024-09-14 DIAGNOSIS — F411 Generalized anxiety disorder: Secondary | ICD-10-CM

## 2024-09-14 DIAGNOSIS — F410 Panic disorder [episodic paroxysmal anxiety] without agoraphobia: Secondary | ICD-10-CM | POA: Diagnosis not present

## 2024-09-14 DIAGNOSIS — F3181 Bipolar II disorder: Secondary | ICD-10-CM | POA: Diagnosis not present

## 2024-09-14 DIAGNOSIS — F4310 Post-traumatic stress disorder, unspecified: Secondary | ICD-10-CM

## 2024-09-14 MED ORDER — LORAZEPAM 1 MG PO TABS
ORAL_TABLET | ORAL | 1 refills | Status: AC
  Filled 2024-09-14: qty 75, fill #0

## 2024-09-14 MED ORDER — VILAZODONE HCL 10 MG PO TABS: 10.0000 mg | ORAL_TABLET | Freq: Every day | ORAL | 0 refills | Status: AC

## 2024-09-14 NOTE — Patient Instructions (Addendum)
 Continue viibryd  10 mg daily  Continue buspirone  7.5 mg twice a day, 15 mg at night Continue risperidone  1 mg at night  Change: take lorazepam  0.5-1 mg twice a day as needed for anxiety, 75 tablets per month  Next appointment: 3/12 at 8 30

## 2024-09-15 NOTE — Progress Notes (Unsigned)
 "  THERAPIST PROGRESS NOTE  Session Time: ***  Participation Level: Active  Behavioral Response: CasualAlert{BHH MOOD:22306}  Type of Therapy: Individual Therapy  Treatment Goals addressed:  Active     Anxiety     LTG: Rolfe will score less than 5 on the Generalized Anxiety Disorder 7 Scale (GAD-7)  (Not Progressing)     Start:  09/17/23    Expected End:  08/27/24       Goal Note     09/02/24: Patient became tearful identifying he is overwhelmed with negative beliefs that he is a failure often leading to increased anxiety.  However, patient notate improvement with panic attacks.         STG: Report a 50% reduction in the frequency of rehearsing thoughts related to social interactions. (Progressing)     Start:  09/17/23    Expected End:  08/27/24       Goal Note     09/02/24: Patient was demonstrate improvements with his ability to engage in alternative perspectives instead of only considering the worst case scenario.         Perform psychoeducation regarding anxiety disorders     Start:  09/17/23         Work with patient individually to identify the major components of a recent episode of anxiety: physical symptoms, major thoughts and images, and major behaviors they experienced     Start:  09/17/23         Coping Skills     Start:  09/17/23       Will work with the pt using CBT/DBT techniques to help the pt verbalize an understanding of the cognitive, physiological, and behavioral components of anxiety and its treatment. This will be done by using worksheets, interactive activities, CBT/ABC thought logs, modeling, homework, role playing and journaling. Will work with pt to learn and implement coping skills that result in a reduction of anxiety and improve daily functioning per pt self report 3 out of 5 documented sessions.       Participating in weekly Cognitive-Behavioral Therapy (CBT) sessions to focus on cognitive restructuring and address irrational  fears.       Start:  09/17/23            Identify and challenge negative thoughts: Recognize distorted or unhelpful thinking patterns and substitute them with more balanced and constructive alternatives.       Start:  09/17/23              Social Interpersonal Effectiveness     LTG: Kendle will recognize socially inappropriate behaviors and develop alternative behaviors (Progressing)     Start:  09/17/23    Expected End:  08/27/24         STG: [name] will demonstrate interest in social activities by initiating/joining social activity without prompts (Progressing)     Start:  09/17/23    Expected End:  08/27/24         STG: [name] will identify 2 behaviors that engage in social isolation  (Not Progressing)     Start:  09/17/23    Expected End:  08/27/24       Goal Note     09/02/24: Patient continues to demonstrate difficulty amongst crowds citing a few instances where he has to remove himself from social situations due to panic attacks.  However, patient continues to demonstrate motivation with his ability to continue to participate in social situations.         Educate [name] on appropriate  behaviors and boundaries     Start:  09/17/23         Facilitate discussions with [name] regarding miscommunications that occur during social interactions     Start:  09/17/23         Educate Ismaeel on aggressive, passive,and assertive communication styles     Start:  09/17/23            Interventions: Supportive, Reframing, and Other: EMDR, inner child work   Summary: [name] LITTIE Rudder III is a 54 y.o. male who presents with low mood, lack of motivation, difficulty concentrating, restlessness, uncontrollable worry, anxious feelings, ruminating thoughts, irritability, difficulty sleeping, tension, and suicidal thoughts. Pt was oriented times 5. Pt was cooperative and engaged. Pt denies SI/HI/AVH.    Patient utilized session to begin EMDR processing a childhood memory  related to feeling he has a failure and not good enough.  Patient reports objective units of distress decreased from a score of 8 to a score of 0.  Cln worked with the patient to instill the belief I can accept myself as I am.  Patient became tearful reflecting on anger, loneliness, and confusion as a result of his parents response throughout his childhood.  Patient was avoidant with eye contact.  Through inner child work, patient was able to demonstrate improvements in understanding his ability to go back and read parent his inner child in an effort to heal feelings of loneliness.  Clinician worked with patient to clear out future situations that may trigger the belief he is a failure and debriefed on solutions he can engage in to improve expectations as well as communication.      Suicidal/Homicidal: Nowithout intent/plan   Therapist Response: During the session, the clinician employed active listening to create a safe environment for the patient to process recent life events. The clinician assessed the patients current symptoms, stressors, and safety since the last session.  Patient began EMDR reprocessing his touchstone memory on his target sequence plan.  Engage patient in and her child work to clear out feelings of loneliness.   Plan: Return again in 2 weeks.   Diagnosis: PTSD (post-traumatic stress disorder)   Bipolar 2 disorder (HCC)   GAD (generalized anxiety disorder)   Panic 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   Collaboration of Care:  [facility] psychiatrist can access notes and cln. Will review psychiatrists' notes. Check in with the patient and will see LCSW per availability. Patient agreed with treatment recommendations.   Patient/Guardian was advised Release of Information must be obtained prior to any record release in order to collaborate their care with an outside provider. Patient/Guardian was advised if they have not already done so to contact the registration department to sign all  necessary forms in order for us  to release information regarding their care.   Consent: Patient/Guardian gives verbal consent for treatment and assignment of benefits for services provided during this visit. Patient/Guardian expressed understanding and agreed to proceed.   Evalene KATHEE Husband, LCSW 09/15/2024  "

## 2024-09-16 ENCOUNTER — Ambulatory Visit: Admitting: Licensed Clinical Social Worker

## 2024-09-16 DIAGNOSIS — F410 Panic disorder [episodic paroxysmal anxiety] without agoraphobia: Secondary | ICD-10-CM

## 2024-09-16 DIAGNOSIS — F3181 Bipolar II disorder: Secondary | ICD-10-CM | POA: Diagnosis not present

## 2024-09-16 DIAGNOSIS — F411 Generalized anxiety disorder: Secondary | ICD-10-CM | POA: Diagnosis not present

## 2024-09-16 DIAGNOSIS — F4310 Post-traumatic stress disorder, unspecified: Secondary | ICD-10-CM | POA: Diagnosis not present
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
## 2024-09-30 ENCOUNTER — Ambulatory Visit: Admitting: Licensed Clinical Social Worker

## 2024-10-07 ENCOUNTER — Ambulatory Visit: Admitting: Licensed Clinical Social Worker

## 2024-10-07 ENCOUNTER — Ambulatory Visit: Admitting: Sleep Medicine

## 2024-10-21 ENCOUNTER — Ambulatory Visit: Admitting: Licensed Clinical Social Worker

## 2024-10-28 ENCOUNTER — Ambulatory Visit: Admitting: Psychiatry

## 2024-11-02 ENCOUNTER — Ambulatory Visit: Admitting: Licensed Clinical Social Worker

## 2024-11-09 ENCOUNTER — Ambulatory Visit: Admitting: Internal Medicine

## 2024-11-10 ENCOUNTER — Ambulatory Visit: Admitting: Nurse Practitioner
